# Patient Record
Sex: Female | Born: 1970 | Hispanic: Yes | Marital: Single
Health system: Southern US, Community
[De-identification: ages and names within clinical notes are randomized; demographics above are authoritative.]

## PROBLEM LIST (undated history)

## (undated) ENCOUNTER — Ambulatory Visit: Admission: RE | Source: Ambulatory Visit

## (undated) DIAGNOSIS — U071 COVID-19: Secondary | ICD-10-CM

## (undated) DIAGNOSIS — R112 Nausea with vomiting, unspecified: Secondary | ICD-10-CM

## (undated) DIAGNOSIS — N200 Calculus of kidney: Secondary | ICD-10-CM

## (undated) DIAGNOSIS — Z9109 Other allergy status, other than to drugs and biological substances: Secondary | ICD-10-CM

## (undated) DIAGNOSIS — F32A Depression, unspecified: Secondary | ICD-10-CM

## (undated) DIAGNOSIS — K219 Gastro-esophageal reflux disease without esophagitis: Secondary | ICD-10-CM

## (undated) DIAGNOSIS — D490 Neoplasm of unspecified behavior of digestive system: Secondary | ICD-10-CM

## (undated) DIAGNOSIS — I38 Endocarditis, valve unspecified: Secondary | ICD-10-CM

## (undated) DIAGNOSIS — R6881 Early satiety: Secondary | ICD-10-CM

## (undated) DIAGNOSIS — G4733 Obstructive sleep apnea (adult) (pediatric): Secondary | ICD-10-CM

## (undated) DIAGNOSIS — K5909 Other constipation: Secondary | ICD-10-CM

## (undated) DIAGNOSIS — J189 Pneumonia, unspecified organism: Secondary | ICD-10-CM

## (undated) DIAGNOSIS — Z90710 Acquired absence of both cervix and uterus: Secondary | ICD-10-CM

## (undated) HISTORY — DX: Nausea with vomiting, unspecified: R11.2

## (undated) HISTORY — DX: COVID-19: U07.1

## (undated) HISTORY — PX: TUBAL LIGATION: SHX77

## (undated) HISTORY — PX: HYSTERECTOMY: SHX81

## (undated) HISTORY — DX: Other constipation: K59.09

## (undated) HISTORY — DX: Early satiety: R68.81

## (undated) HISTORY — DX: Pneumonia, unspecified organism: J18.9

## (undated) HISTORY — DX: Calculus of kidney: N20.0

## (undated) HISTORY — DX: Gastro-esophageal reflux disease without esophagitis: K21.9

## (undated) HISTORY — PX: HERNIA REPAIR: SHX51

## (undated) HISTORY — DX: Obstructive sleep apnea (adult) (pediatric): G47.33

## (undated) HISTORY — PX: GALLBLADDER SURGERY: SHX652

## (undated) HISTORY — DX: Neoplasm of unspecified behavior of digestive system: D49.0

## (undated) HISTORY — DX: Depression, unspecified: F32.A

## (undated) HISTORY — DX: Other allergy status, other than to drugs and biological substances: Z91.09

## (undated) HISTORY — PX: CYST REMOVAL: SHX22

## (undated) HISTORY — PX: VAGINAL HYSTERECTOMY: SUR661

## (undated) NOTE — Nursing Note (Signed)
Formatting of this note might be different from the original.  Patient alert and oriented X4. Reported headache and jaw pain consistent with normal symptoms she has at baseline. Tylenol given for headache in am. Imitrex given in afternoon per patient request. Patient more alert and less sleepy this afternoon than in morning or previous shift. Interactive with good appetite noted. Encouraged patient to stay hydrated due to higher elevation and dryness than her home in [place]. Patient still requiring 3.5L of oxygen.   Electronically signed by Tula Nakayama, RN at 09/17/2022  6:16 PM MDT

## (undated) NOTE — Care Plan (Signed)
Formatting of this note might be different from the original.    Problem: Pain - Adult  Goal: Verbalizes/displays adequate comfort level or baseline comfort level  Outcome: Progressing    Problem: Infection - Adult  Goal: Absence of infection at discharge  Outcome: Progressing    Problem: Safety - Adult  Goal: Free from fall injury  Outcome: Progressing    Problem: Respiratory - Adult  Goal: Achieves optimal ventilation and oxygenation  Outcome: Progressing    Problem: Gastrointestinal - Adult  Goal: Maintains or returns to baseline bowel function  Outcome: Progressing   The patient is Moderately Stable - Low risk of patient condition declining or worsening      Electronically signed by Tommi Emery, RN at 09/20/2022 12:20 PM MDT

## (undated) NOTE — Nursing Note (Signed)
Formatting of this note might be different from the original.  Patient A&Ox4. Patient reports 10/10 migraine and nausea. Patient is VSS on 3L O2. Patient given PRN tylenol, sumatriptan, and oxy without relief. Patient given PRN Zofran and Ativan for Nausea with relief. Patient was able to sleep through pain. Patient resting in bed. Call light in reach.  Electronically signed by Ferd Glassing, RN at 09/17/2022  6:23 AM MDT

## (undated) NOTE — Nursing Note (Signed)
Formatting of this note might be different from the original.  Pt is Aox4  Pt anxious, reporting frequent 8-10\10 pain and nausea. PRN medication given per [facility] with little relief.     Pt rested peacefully with BiPAP on intermittently.     VSS on 3L NC while awake. BiPAP used while sleeping.     Pt resting comfortably, no further needs at this time, call light within reach.   Electronically signed by Saintclair Halsted, RN at 09/19/2022  7:05 AM MDT

## (undated) NOTE — Nursing Note (Signed)
Formatting of this note might be different from the original.  Pt Aox4. VSS on 1L NC. All medications given per [facility]. Assessment as charted. PRN Tylenol and PRN Toradol given for 9/10 headache. Zofran given x1, relief provided. Robitussin given x1 per MAR. Pt denies SOB. Pt still having a productive cough.     No further needs at this time. Pt lying in bed comfortably with call light in reach. Fall precautions taken.     IV taken out. Last set of vitals taken. Pt ready for discharge.   Electronically signed by Tommi Emery, RN at 09/20/2022  3:21 PM MDT

## (undated) NOTE — Progress Notes (Signed)
Formatting of this note is different from the original.  Case Management Notes:    Pt will [place] to friends home for one week before travelling back to [place]. O2 arranged through [facility]. No other needs verbalized at this time.    09/19/2022: Valerie May' home qualifying study shows the need for home oxygen at 3 L NC continuous; FOC discussed and patient chose [facility] as her oxygen provider - oxygen orders have been pende but MD wants to reassess for any improvement in respiratory status in the am to see if she will tolerate weaning - oxygen orders HAVE NOT been sent to [facility]; patient will also be discharging to the home of her friend Valerie May at [address] [place] [address] -for oxygen set up purposes; CM continuing to follow for any additional needs    Case Management Initial Assessment for Valerie May    08/20: CM met with Valerie May this morning. The patient reports she has stable living arrangments in [place]. She currently works with her friend Valerie May([phone]) and she will provide a ride home upon discharge to [address], [place]. Patient denies use of O2 at home, DME for ambulation and is independent with all ADL's. Cm will follow for any identified [place] needs.     Pre-admission Assessment and Social Determinants of Health (SDOH)    Patient's age: 33 y.o.     Assessment of acts of daily living and capacity for self-care: independent         Prior level of function and use of durable medical equipment: None, per patient    Living Situation at time of admission: Lives Alone    Do you feel your living situation is safe? yes    Are you worried about losing your housing? Not at this time, per pt.     Are you having difficulty paying for the basics like food, housing, medical care, and heating? NO    Has lack of reliable transportation kept you from medical appointments, meetings, work or from getting things needed for daily living? NO    Do you have family or a  support system and are they availability to help you at discharge? Yes, friend Valerie May [phone]    If for any reason you need help with day-to-day activities such as bathing, preparing meals, shopping, managing finances, etc., can you get the help you need? yes    Pre-Admission [facility]: None    Current Status of Admission  Diagnosis:     Pneumonia of both lungs due to infectious organism, unspecified part of lung   [J96.01] Acute respiratory failure with hypoxia (HCC)     Mental Status: oriented to person, place, time, and general circumstances    Known changes in mobility with hospital admission? Yes, decreased mobility r/t to oxygen needs and diagnosis.    Patient Demographics        Name Patient ID SSN Gender Identity Birth Date    Valerie May, Valerie May [id number] Female Jun 23, 1970 (51 yrs)             Address Phone Email        [address]  [place] [address] [phone] (H) [email or web address]               Race Occupation Emp Status        Other -- --               Reg Status PCP  Date Last Verified Next Review Date      Verified -- 09/15/22 10/15/22              Marital Status Religion Language        Single -- English               Emergency Contact 1    Valerie May [phone])  [phone] (M)                 Electronically signed by Valerie Melnick, LCSW at 09/20/2022  1:01 PM MDT

## (undated) NOTE — Care Plan (Signed)
Formatting of this note might be different from the original.  The patient is Moderately Stable - Low risk of patient condition declining or worsening    Problem: Pain - Adult  Goal: Verbalizes/displays adequate comfort level or baseline comfort level  Outcome: Maintaining  Flowsheets (Taken 09/17/2022 1117 by Tula Nakayama, RN)  Verbalizes/displays adequate comfort level or baseline comfort level:   Encourage patient to monitor pain and request assistance   Assess pain using appropriate pain scale   Administer analgesics based on type and severity of pain and evaluate response   Implement non-pharmacological measures as appropriate and evaluate response   Consider cultural and social influences on pain and pain management    Problem: Infection - Adult  Goal: Absence of infection at discharge  Outcome: Maintaining  Flowsheets (Taken 09/16/2022 0358 by Robinette Haines, RN)  Absence of infection at discharge:   Assess and monitor for signs and symptoms of infection   Monitor lab/diagnostic results   Monitor all insertion sites i.e., indwelling lines, tubes and drains   Administer medications as ordered    Problem: Safety - Adult  Goal: Free from fall injury  Outcome: Maintaining  Flowsheets (Taken 09/17/2022 1117 by Tula Nakayama, RN)  Free from fall injury: Instruct family/caregiver on patient safety    Problem: Respiratory - Adult  Goal: Achieves optimal ventilation and oxygenation  Outcome: Maintaining  Flowsheets (Taken 09/17/2022 0930 by Tula Nakayama, RN)  Achieves optimal ventilation and oxygenation: Position to facilitate oxygenation and minimize respiratory effort    Problem: Gastrointestinal - Adult  Goal: Maintains or returns to baseline bowel function  Outcome: Maintaining  Flowsheets (Taken 09/16/2022 0358 by Robinette Haines, RN)  Maintains or returns to baseline bowel function: Assess bowel function    Electronically signed by Cyndia Skeeters, RN at 09/19/2022 12:50 PM MDT

## (undated) NOTE — Progress Notes (Signed)
Formatting of this note is different from the original.     09/19/22 1325   Resting Oximetry   Home O2 Patient Position Sitting   Resting SpO2 on RA 86   Resting SpO2 Goal 90   Liter Flow at Rest to Maintain Goal 3   Activity Oximetry   Patient Activity During Home O2 Eval Walking   Activity SpO2 on RA 85   Activity SpO2 Goal 90   Liter Flow with Activity to Maintain Goal 3  (4 lpm spo2 94%)   Activity SpO2 at 1 min 90   Activity SpO2 at 2 min 90   Activity SpO2 at 3 min 90   Activity SpO2 at 4 min 88   Activity SpO2 at 5 min 90   Activity SpO2 at 6 min 90   Home O2 Activity Distance   (around the unit)   Requires greater than or equal to 4L/min? No       Electronically signed by Janeal Holmes, RRT at 09/19/2022  1:30 PM MDT

## (undated) NOTE — Care Plan (Signed)
Formatting of this note might be different from the original.    Problem: Respiratory - Adult  Goal: Achieves optimal ventilation and oxygenation  Recent Flowsheet Documentation  Taken 09/17/2022 0930 by Tula Nakayama, RN  Achieves optimal ventilation and oxygenation: Position to facilitate oxygenation and minimize respiratory effort    Problem: Pain - Adult  Goal: Verbalizes/displays adequate comfort level or baseline comfort level  Outcome: Not Progressing  Flowsheets (Taken 09/17/2022 1117)  Verbalizes/displays adequate comfort level or baseline comfort level:   Encourage patient to monitor pain and request assistance   Assess pain using appropriate pain scale   Administer analgesics based on type and severity of pain and evaluate response   Implement non-pharmacological measures as appropriate and evaluate response   Consider cultural and social influences on pain and pain management    Problem: Safety - Adult  Goal: Free from fall injury  Outcome: Not Progressing  Flowsheets (Taken 09/17/2022 1117)  Free from fall injury: Instruct family/caregiver on patient safety    Electronically signed by Tula Nakayama, RN at 09/17/2022 11:18 AM MDT

## (undated) NOTE — Nursing Note (Signed)
Formatting of this note might be different from the original.  Pt is Aox4, with VSS on 3.5L NC.     Pt reporting consistent 10/10 headache, PRN tylenol, oxy, and ice packs given with no relief.   Pt anxious, and at times tearful.    Pt experienced 2x episodes of nausea, emesis X1. PRN zofran given with relief. Pt also reporting pain in the abdomen and feelings of fullness/constipation, however, no BM this shift, PRN meds given as ordered.     Pt resting in bed, no further needs at this time, call light within reach, bed alarm on for pt safety.  Electronically signed by Saintclair Halsted, RN at 09/18/2022  6:42 AM MDT
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
## (undated) NOTE — Progress Notes (Signed)
Formatting of this note is different from the original.     09/18/22 1030   Non Invasive Ventilation   NIV Type   (Autopap)   Mask Type Face mask   Mask Size Small   Skin Integrity Checked Yes   Expiratory Positive Airway Pressure (EPAP/CPAP) (cmH20) 15 cmH20  (max @ 20 when Pt sleeps SPO2 90-92)   Liter Flow Bleed In 3.5 L/min   Humidifier Filled       Electronically signed by Janeal Holmes, RRT at 09/18/2022 12:12 PM MDT

## (undated) NOTE — Care Plan (Signed)
Formatting of this note might be different from the original.  The patient is Moderately Stable - Low risk of patient condition declining or worsening  Problem: Pain - Adult  Goal: Verbalizes/displays adequate comfort level or baseline comfort level  Outcome: Maintaining        Electronically signed by Kandis Cocking, RN at 09/18/2022 12:19 PM MDT

## (undated) NOTE — Progress Notes (Signed)
Formatting of this note is different from the original.  DAILY PROGRESS NOTE    MYDA DETWILER is a 32 y.o. old female admitted on 09/15/2022 for hypoxia.    Date of Service: 09/19/2022    24 Hour Events:   No major events    SUBJECTIVE    Yelling little better today, hopeful to be able to discharge within the next day.  Not somnolent.  Headache is better than yesterday.  Discussed findings on CT scan and imaging.  Hopeful to reduce amount of oxygen required for discharged home tomorrow.    MEDICATIONS  Current Facility-Administered Medications   Medication Dose Route Frequency Provider Last Rate Last Admin    acetaminophen (Tylenol) tablet 650 mg  650 mg Oral q4h PRN Dell Ponto, MD   650 mg at 09/19/22 1610    Or    acetaminophen (Tylenol) suspension 650 mg  650 mg Oral q4h PRN Dell Ponto, MD        Or    acetaminophen (Tylenol) suppository 650 mg  650 mg Rectal q4h PRN Dell Ponto, MD        albuterol (2.5 MG/3ML) 0.083% nebulizer solution 2.5 mg  2.5 mg Nebulization q2h PRN Dell Ponto, MD        azithromycin (Zithromax) tablet 500 mg  500 mg Oral Nightly Jonelle Sidle, MD   500 mg at 09/18/22 2109    bisacodyl (Dulcolax) EC tablet 10 mg  10 mg Oral Daily PRN Dell Ponto, MD   10 mg at 09/17/22 2132    budesonide-formoterol (Symbicort) 160-4.5 MCG/ACT inhaler 2 puff  2 puff Inhalation BID PRN Dell Ponto, MD        cefTRIAXone (Rocephin) IVPB 1 g  1 g Intravenous Nightly Dell Ponto, MD   Stopped at 09/18/22 2139    enoxaparin (Lovenox) syringe 40 mg  40 mg Subcutaneous q24h [facility] Dell Ponto, MD   40 mg at 09/19/22 0816    guaiFENesin-codeine (Robitussin-AC) 100-10 MG/5ML syrup 10 mL  10 mL Oral q4h PRN Dell Ponto, MD   10 mL at 09/19/22 0349    ipratropium-albuterol (Duo-Neb) 0.5-2.5 mg/3 mL nebulizer solution 3 mL  3 mL Nebulization q6h Jonelle Sidle, MD   3 mL at 09/19/22 1334    ketorolac (Toradol) injection 15 mg  15 mg Intravenous q8h PRN Harriette Bouillon, MD   15 mg at 09/19/22  0047    loratadine (Claritin) tablet 10 mg  10 mg Oral Daily Dell Ponto, MD   10 mg at 09/19/22 0815    melatonin tablet 3 mg  3 mg Oral Nightly PRN Dell Ponto, MD   3 mg at 09/18/22 0113    montelukast (Singulair) tablet 10 mg  10 mg Oral Nightly Dell Ponto, MD   10 mg at 09/18/22 2109    naloxone (Narcan) 0.2 mg in sodium chloride 0.9 % RRT Compounded syringe  0.2 mg Intravenous PRN Dell Ponto, MD        ondansetron (Zofran) injection 4 mg  4 mg Intravenous q6h PRN Jonelle Sidle, MD   4 mg at 09/19/22 0518    Or    ondansetron ODT (Zofran-ODT) disintegrating tablet 4 mg  4 mg Oral q6h PRN Jonelle Sidle, MD   4 mg at 09/18/22 1854    polyethylene glycol (Miralax) packet 17 g  17 g Oral Daily Dell Ponto, MD   17 g at 09/19/22 0816    venlafaxine XR (Effexor-XR) 24 hr capsule 75 mg  75 mg Oral Daily with breakfast Dell Ponto, MD   75 mg at 09/19/22 0815     OBJECTIVE  Current Vital Signs  Temp: 36.5 C (97.7 F) BP: 126/79 Heart Rate: 105     Resp: 24   (out of 10) Weight: 84.4 kg (186 lb 1.1 oz) SpO2: 91 %     Physical Exam  Gen: NAD  Eyes: sclera anicteric  CV: RRR, no murmur  Resp: lungs are clear with normal respiratory effort  Abdomen: soft, ND, NT, bowel sounds are active  MSK: no LE edema or erythema  Skin: no significant rash  Neuro: speech normal and fluent, no obvious focal deficits  Psych: appropriate affect    Diagnostics and Labs  Relevant diagnostic, laboratory and radiological studies have been reviewed in the Electronic Medical Record from 09/19/2022 and significant for the following:  Lab Results   Component Value Date    WBC 9.7 09/19/2022    HGB 13.8 09/19/2022    PLT 346 09/19/2022    NA 139 09/19/2022    K 3.4 09/19/2022    CL 97 (L) 09/19/2022    CO2 35 (H) 09/19/2022    BUN 12 09/19/2022    CREATININE 0.45 (L) 09/19/2022    AST 25 09/16/2022    ALT 27 09/16/2022    ALKPHOS 101 09/16/2022    ALBUMIN 3.7 09/16/2022     Cta chest  IMPRESSION:  1.  No PE through the segmental  level.  2.  Focal airspace opacities in both lung bases indicate pneumonia.    Electronically signed by: Madalyn Rob, MD    ASSESSMENT & PLAN  Ms. ETHELENE CLOSSER is a 37 y.o. 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    #Acute hypercapnic hypoxic respiratory failure  #Suspected pneumonia  -Symptoms concerning for viral infection however viral panel negative.   -Treating for complete course of community-acquired pneumonia with ceftriaxone and azithromycin.  -Bicarb up trended and has VBG consistent with compensated hypercapnia. She has likely OSA and has not been compliant with her CPAP in hospital that is contributing to this, no wheezing on exam. Has been receiving occasional opiate as well as which has been stopped.   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  - encourage BiPAP use at night, as I suspect that in some degree contributing to her ongoing hypoxia.   -Robitussin AC for cough.  APAP for fever.  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  -Not wheezing on exam.    -Continue home Singulair, Symbicort, as needed albuterol.    #Right upper quadrant discomfort  -Resolved  -Patient does not have gallbladder.    -LFTs unremarkable.   -likely referred pain from her lower lobe pneumonia.  Treat pneumonia as above.  Low-dose oxycodone added to APAP due to patient complaint of uncontrolled pain.    # right ear pain:  # headache:  -states this has been present since covid. Has seen two ent providers without clear diagnosis  - also reports history of migraine  headaches and takes sumatriptan and excedrine for  this.   -CT head without contrast did not show any acute findings.  -Elevated CO2 is likely contributing somewhat to her headaches.  -After further review with the patient she is taking 8 or more doses of Excedrin daily for headache.  Anticipate that she has some degree of rebound headache from this and encouraged her to taper off.  -prn tylenol, toradol PRN -limit as able and reduce over the next week.  - stopped oxycodone    #Chronic constipation  Schedule MiraLAX, as needed Dulcolax ordered.    #Suspect OSA  -continued desatting significantly while sleeping.    -OSA protocol ordered.    -Recommend outpatient sleep study.    DVT Prophylaxis:  Lovenox   Code Status: Full Code   Surrogate decision maker designated as her son Barbara Cower, number in chart.    Harriette Bouillon, MD  Hospitalist    Electronically signed by Harriette Bouillon, MD at 09/19/2022  3:44 PM MDT

## (undated) NOTE — Nursing Note (Signed)
Formatting of this note might be different from the original.  Pt A/Ox4, VSS on 3L, patient self removed oxygen a couple of times during the shift and sats noted to be in the 50s, Reinforced to patient need to keep oxygen on at all times. pt has been wearing CPAP at night.  Pt states 10/10 pain in right ear and head, pain slightly relieved with medication.  Pt able to rest through pain as well.  Stated nausea relieved with medication  Bilateral inspiratory wheezes with fine crackles heard in base of lungs.  Pt educated on cough and deep breathe technique  Pt stated lower extremities were cold.  Pedal pulses 2+, bilateral lower extremities warm and dry. CMS intact. Warm blanket given for comfort  Regular diet, pt eating jello and crackers mostly  Continent x2  Pt is able to turn self  Pt denies further needs, pt resting in bed with call light in reach  Electronically signed by Lacinda Axon, RN at 09/16/2022  5:49 PM MDT
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
## (undated) NOTE — ED Provider Notes (Signed)
Formatting of this note is different from the original.  EMERGENCY DEPARTMENT ADULT ENCOUNTER     09/15/2022    Patient Name: Valerie May      DOB: 15-Jul-1970      MEDICAL RECORD [id number]    CHIEF COMPLAINT:  Chief Complaint   Patient presents with    Cough    Fatigue    Breathing Problem     Cough fatigue body aches.  Abdominal pain back pain since yesterday     MEDICAL DECISION MAKING & ED COURSE:     FINAL IMPRESSION(S):  Final diagnoses:   [J18.9] Pneumonia of both lungs due to infectious organism, unspecified part of lung   [J96.01] Acute respiratory failure with hypoxia [facility])       MEDICAL DECISION MAKING:  Valerie May is a 51 y.o. woman with history as below presenting with 2-3 days of worsening cough, shortness of breath, subjective fevers with multiple sick contacts here with acute on chronic respiratory failure and new oxygen requirement with possible underlying community-acquired pneumonia.    Arrival patient is saturating 86-87% on room air was promptly placed on 2 L nasal cannula which has been maintaining saturations in the low 90%.  Patient has a history of intermittent oxygen requirement at home but does not routinely use it and that she is feeling symptomatic.  Patient with medical workup here that was initially unrevealing, however in light of persistent hypoxia despite nebulizer treatment particular with desaturations to as low as 82 to 84% with exertion I thought it best to expand workup to include advanced imaging of the chest as well as abdomen and pelvis.  Focal opacities recognized in the by basilar lung spaces thought to possibly represent an acute pneumonia.     At this time patient is not having overwhelming pneumonia.  I suspect that there may be a burgeoning bacterial pneumonia versus possible viral pneumonia which coupled with the patient's likely underlying lung condition from prior severe COVID-19 infection as well as the altitude has resulted in fairly profound  hypoxemia.    Patient with ongoing abdominal pain symptoms with subjective constipation, CT of the abdomen/pelvis revealed no acute findings. Patient was requesting stronger pain medication aside from the ketorolac as well as the acetaminophen.  Without any discernible cause for pain in the abdomen pelvis I am hesitant to administer anything stronger than these at this time.    A recommended patient for admission to the hospital given ongoing oxygen requirements and poorly controlled symptoms.  Patient was initially amenable however I was informed by nursing staff that she was requesting to leave AGAINST MEDICAL ADVICE.  As such I did not administer IV antibiotics but gave her a dose of oral Levaquin with a plan to prescribe this as an outpatient.  I was soon thereafter called back to patient's bed by stating that she had changed her mind and was now requesting admission to the hospital.    During my evaluation, I considered hospitalization and ultimately decided that the patient requires hospital admission and hospital level of care.    Will kindly admit patient to Dr. Renard Hamper on a acute care bed.     ED COURSE:  ED Course as of 09/15/22 2346   Mon Sep 15, 2022   1647 Chest X-ray images (independently reviewed by me): No lobar consolidation, large pneumothorax, or mediastinal widening, trachea midline. I agree with the radiology report.     1818 Reevaluated bedside still with desaturations to the high 80  and low 90%'s     ED MEDICATIONS/PRESCRIPTIONS:    ED Medication Administration from 09/15/2022 1408 to 09/15/2022 2346         Date/Time Order Dose Route Action Action by     09/15/2022 1642 MDT sodium chloride 0.9 % bolus 1,000 mL 1,000 mL Intravenous New Bag Vanderwege, K     09/15/2022 1644 MDT famotidine (PF) (Pepcid) injection 20 mg 20 mg Intravenous Given Vanderwege, K     09/15/2022 1644 MDT ketorolac (Toradol) injection 15 mg 15 mg Intravenous Given Vanderwege, K     09/15/2022 1644 MDT ondansetron (Zofran)  injection 4 mg 4 mg Intravenous Given Vanderwege, K     09/15/2022 1654 MDT sodium chloride 0.9 % bolus 1,000 mL 1,000 mL Intravenous New Bag Vanderwege, K     09/15/2022 1739 MDT ipratropium-albuterol (Duo-Neb) 0.5-2.5 mg/3 mL nebulizer solution 3 mL 3 mL Nebulization Given Byrnes, R     09/15/2022 1742 MDT sodium chloride 0.9 % bolus 1,000 mL 0 mL Intravenous Stopped Vanderwege, K     09/15/2022 1754 MDT sodium chloride 0.9 % bolus 1,000 mL 0 mL Intravenous Stopped Vanderwege, K     09/15/2022 2033 MDT iopamidol (Isovue-370) 76 % injection 120 mL 120 mL Intravenous Given Shelton Silvas     09/15/2022 2033 MDT sodium chloride 0.9 % bolus 100 mL 100 mL Intravenous New Bag Daphine Deutscher, Shela Commons     09/15/2022 2033 MDT sodium chloride 0.9 % flush 10 mL 10 mL Intravenous Given Shelton Silvas     09/15/2022 2133 MDT sodium chloride 0.9 % bolus 100 mL 0 mL Intravenous Lottie Dawson, J     09/15/2022 2200 MDT azithromycin (Zithromax) 500 mg in sodium chloride 0.9 % 250 mL IVPB -- Intravenous Canceled Entry Lilly, N     09/15/2022 2223 MDT acetaminophen (Tylenol) tablet 1,000 mg 1,000 mg Oral Given Lilly, N     09/15/2022 2223 MDT cefTRIAXone (Rocephin) IVPB 1 g 1 g Intravenous [employer], N     09/15/2022 2223 MDT levoFLOXacin (Levaquin) tablet 500 mg 500 mg Oral Given Lilly, N     09/15/2022 2253 MDT cefTRIAXone (Rocephin) IVPB 1 g 0 g Intravenous Stopped Lilly, N     09/15/2022 2334 MDT azithromycin (Zithromax) 500 mg in sodium chloride 0.9 % 250 mL IVPB 500 mg Intravenous New Bag Lilly, N         New Prescriptions    ALBUTEROL 108 (90 BASE) MCG/ACT INHALER    Inhale 2 puffs every 4 (four) hours if needed for wheezing.    BENZONATATE (TESSALON PERLES) 100 MG CAPSULE    Take 1 capsule (100 mg) by mouth 3 (three) times a day as needed for cough for up to 7 days. Do not crush or chew.    LEVOFLOXACIN (LEVAQUIN) 500 MG TABLET    Take 1 tablet (500 mg) by mouth Daily for 7 days. Do not start before September 16, 2022.     Modified Medications     No medications on file       DISPOSITION:  Admit    CONDITION AT DISPOSITION:  Fair    FOLLOW UP:  No follow-up provider specified.    Subjective   HISTORY:     Valerie May is a 58 y.o. woman with a history of prior severe COVID requiring intubation ("I had only 5% life left," apparently intubated with a multi month hospital course) with resultant asthma, obesity, s/p cholecystectomy who is presenting today with 2-3 days of worsening cough,  shortness of breath with associated fevers and upper abdominal pain worse with deep inspiration and coughing.    Patient states that she lives in [place] and drove out here last week to visit her friend.  She spent multiple hours in the car without mobilizing.  She states while in the car she was exposed to multiple young people who went on to develop a cough as well as fever.  No one had checked for COVID with home testing.  She last took Tylenol and ibuprofen approximately 7 hours prior to arrival.  She is visiting [place] and plans to be here for the next week.    Patient states she intermittently uses oxygen at home when "I am feeling short of breath."  She does not have a specific oxygen saturation target and does not use oxygen routinely or at night.  She has presented to the hospital multiple times in [place] with shortness of breath that is improved with nebulizers.    No history of DVT/VTE.    REVIEW OF SYSTEMS:  A 10-point ROS was obtained and is negative except as noted in the HPI.     PAST MEDICAL HISTORY:  No past medical history on file.    PAST SURGICAL HISTORY:  No past surgical history on file.    CURRENT MEDICATIONS:    Current Facility-Administered Medications:     azithromycin (Zithromax) 500 mg in sodium chloride 0.9 % 250 mL IVPB, 500 mg, Intravenous, Once, Bosie Helper, MD, Last Rate: 250 mL/hr at 09/15/22 2334, 500 mg at 09/15/22 2334    Current Outpatient Medications:     acetaminophen (Tylenol 8 Hour) 650 MG ER tablet, Take 1 tablet (650  mg) by mouth every 8 (eight) hours if needed for mild pain., Disp: , Rfl:     albuterol (Ventolin HFA) 108 (90 Base) MCG/ACT inhaler, Inhale 1-2 puffs every 4 (four) hours if needed for shortness of breath or wheezing., Disp: , Rfl:     budesonide-formoterol (Symbicort) 160-4.5 MCG/ACT inhaler, Inhale 2 puffs 2 (two) times a day as needed (SOB)., Disp: , Rfl:     butalbital-acetaminophen-caffeine (Fioricet) 50-300-40 MG capsule, Take 1 capsule by mouth every 6 (six) hours if needed for headaches., Disp: , Rfl:     cetirizine (ZyrTEC) 10 MG tablet, Take 1 tablet (10 mg) by mouth Daily., Disp: , Rfl:     fluticasone (Flonase) 50 MCG/ACT nasal spray, Administer 1 spray into each nostril if needed at bedtime for rhinitis., Disp: , Rfl:     ibuprofen 200 MG tablet, Take 1 tablet (200 mg) by mouth every 8 (eight) hours if needed for mild pain., Disp: , Rfl:     lidocaine (Xylocaine) 2 % solution, Take 15 mL by mouth if needed for mild pain., Disp: , Rfl:     montelukast (Singulair) 10 MG tablet, Take 1 tablet (10 mg) by mouth at bedtime., Disp: , Rfl:     venlafaxine XR (Effexor-XR) 75 MG 24 hr capsule, Take 1 tablet by mouth Daily., Disp: , Rfl:     albuterol 108 (90 Base) MCG/ACT inhaler, Inhale 2 puffs every 4 (four) hours if needed for wheezing., Disp: 18 g, Rfl: 0    benzonatate (Tessalon Perles) 100 MG capsule, Take 1 capsule (100 mg) by mouth 3 (three) times a day as needed for cough for up to 7 days. Do not crush or chew., Disp: 20 capsule, Rfl: 0    Calcium Carbonate Antacid (ALKA-SELTZER ANTACID PO), Take 1 tablet by mouth Daily  as needed (indigestion)., Disp: , Rfl:     [START ON 09/16/2022] levoFLOXacin (Levaquin) 500 MG tablet, Take 1 tablet (500 mg) by mouth Daily for 7 days. Do not start before September 16, 2022., Disp: 6 tablet, Rfl: 0     ALLERGIES:  Allergies   Allergen Reactions    Benadryl [Diphenhydramine]     Compazine [Prochlorperazine]      SOCIAL HISTORY:  Social History     Socioeconomic History     Marital status: Single     Spouse name: Not on file    Number of children: Not on file    Years of education: Not on file    Highest education level: Not on file   Occupational History    Not on file   Tobacco Use    Smoking status: Not on file    Smokeless tobacco: Not on file   Substance and Sexual Activity    Alcohol use: Not on file    Drug use: Not on file    Sexual activity: Not on file   Other Topics Concern    Not on file   Social History Narrative    Not on file     Social Determinants of Health     Financial Resource Strain: Not on file   Food Insecurity: Not on file   Transportation Needs: Not on file   Physical Activity: Not on file   Stress: Not on file   Social Connections: Not on file   Intimate Partner Violence: Not on file   Housing Stability: Not on file       Objective   PHYSICAL EXAM:     ED Triage Vitals [09/15/22 1426]   Temp Temp src Heart Rate Heart Rate (Pulse) Source Resp BP Patient Position BP Location SpO2 FiO2 (%)   36.6 C (97.9 F) -- 108 -- 20 123/76 -- -- (!) 87 % --       *Vital signs were reviewed in the chart. Please see chart for full vital signs. The macro above only displays vital signs that may have been obtained prior to this note being started.     Pulse Ox Interpretation:  87% on room air, abnormally low pulse oximetry compared to patient's baseline (as interpreted by me)    Gen: Moderate distress secondary to pain, slightly anxious appearing Hispanic woman sitting upright in hospital gurney  HEENT: Normocephalic, atraumatic, pupils equal round and reactive to light, EOMI, moist mucous membranes, normal phonation  Neck: Normal appearance, trachea midline  Chest: Normal appearance, symmetric rise and fall  Lungs: No respiratory distress, no apparent wheezing there is some bibasilar crackles present  Cardiac: No murmurs, rubs or gallops, regular rate and rhythm   Abdomen: Soft, normal focal TTP, non-distended, no peritoneal signs, no rebound tenderness or guarding  Back:  Normal inspection  Integument: No rashes or clinically significant lesions, normal skin turgor  Musculoskeletal: no long bone deformities, normal active and passive range of motion at major joints   Neuro: Moving all extremities spontaneously, GCS 15, A/Ox4, normal gait   Psych: Normal mood, normal affect, interacting appropriately with good eye contact    DIAGNOSTIC RESULTS:     RADIOLOGY REPORTS:  CT Abdomen Pelvis with Contrast   Final Result   No acute finding in the abdomen or pelvis. See same day report for description of findings in the chest.     Electronically signed by: Madalyn Rob, MD     CTA Chest   Final  Result   1.  No PE through the segmental level.   2.  Focal airspace opacities in both lung bases indicate pneumonia.     Electronically signed by: Madalyn Rob, MD     XRAY Chest Portable   Final Result   Low inspiratory volumes.     Electronically signed by: Madalyn Rob, MD         LABORATORY RESULTS:  Labs Reviewed   CBC WITH AUTO DIFFERENTIAL - Abnormal       Result Value    WBC 8.6      RBC 4.67      Hemoglobin 14.2      Hematocrit 43.1      MCV 92.3      MCH 30.4      MCHC 32.9      RDW CV 12.3      RDW SD 41.7      Platelet Count 368      MPV 9.2      Neutrophils Absolute 4.84      Lymphocytes Absolute 2.24      Monocytes Absolute 0.80      Eosinophils Absolute 0.60      Basophils Absolute 0.08      Immature Granulocytes Absolute 0.06 (*)     Neutrophils Abs, (Segs and Bands) 4,840      Neutrophils % 56.1      Lymphocytes % 26.0      Monocytes % 9.3      Eosinophils % 7.0      Basophils % 0.9      Immature Granulocytes % 0.7      Nucleated RBC <2     PROCALCITONIN - Abnormal    Procalcitonin 0.11 (*)     Narrative:     - PCT >2 ng/mL: a PCT level above 2.0 ng/mL on the first day of admission is associated with a high risk for progression to severe sepsis and/or septic shock.                     - PCT < 0.5 ng/mL: A PCT level below 0.5 ng/mL on the first day of admission is  associated with a low risk for progression to severe sepsis and/or septic shock.                     - Neonates can have an elevated PCT level for 72 hours post birth, peaking at approximately 24 hours post birth.   SARS-COV-2 NAD - Normal    SARS-CoV-2 (COVID-19) Not Detected      Narrative:     Testing performed by real-time polymerase chain reaction (RT-PCR) methodology on the [employer] Liat instrument.    COMPREHENSIVE RESPIRATORY PATHOGEN PANEL, NAT - Normal    Adenovirus Detection by PCR Not Detected      Coronavirus HKU1 (not a test for COVID-19) Not Detected      Coronavirus NL63 (not a test for COVID-19) Not Detected      Coronavirus 229E (not a test for COVID-19) Not Detected      Coronavirus OC43 (not a test for COVID-19) Not Detected      Human Metapneumovirus Not Detected      Human Rhinovirus/Enterovirus Not Detected      Influenza A Not Detected      Influenza B Not Detected      Parainfluenza 1 Not Detected      Parainfluenza 2 Not Detected  Parainfluenza 3 Not Detected      Parainfluenza 4 Not Detected      RSV Not Detected      SARS-CoV-2 (COVID-19) Not Detected      Bordetella pertussis PCR Not Detected      B. parapertussis DNA Not Detected      Chlamydophila pneumoniae Not Detected      Mycoplasma pneumoniae Not Detected      Narrative:     This test was performed by multiplexed nested PCR and melting curve analysis on the [employer] instrument. Clinical testing for this panel is approved by the [place] Food and Drug Administration (FDA).    COMPREHENSIVE METABOLIC PANEL - Normal    Glucose 97      BUN 8      Creatinine 0.61      Sodium 141      Potassium 3.7      Chloride 101      CO2 27      Anion Gap 13      Calcium 9.4      Total Protein 7.2      Albumin 4.1      Alkaline Phosphatase 121      AST 26      ALT (SGPT) 30      Bilirubin Total 0.2      eGFR 109.8     LIPASE - Normal    Lipase 18     D-DIMER, QUANTITATIVE - Normal    D-Dimer, Quant (FEU) 0.37      Narrative:     For  patients >53 years of age the test cut off is the age divided by 100                  A D-dimer level < 0.50 ug/mL FEU is inconsistent with venous thromboembolic disease. In a patient with a low or intermediate pretest probability, venous thromboembolic disease can be ruled out if the D-dimer level is < 0.50 ug/mL FEU. Elevated D-dimer levels are nonspecific and may be seen in recent surgery, acute infection, malignancy and renal disease.   N-TERMINAL PROBNP - Normal    NT-Pro BNP <36     CBC AND DIFFERENTIAL    Narrative:     The following orders were created for panel order CBC and differential.                  Procedure                               Abnormality         Status                                     ---------                               -----------         ------                                     CBC auto differential[[id number]]         Abnormal            Final result  Please view results for these tests on the individual orders.   EXTRA SST TUBE    Extra Tube Hold for add-ons.     LEGIONELLA ANTIGEN, URINE   STREP PNEUMONIAE ANTIGEN, URINE     CARDIAC MONITOR:  I ordered cardiac monitoring for patient. Patient was placed on cardiac monitoring to evaluate for arrhythmia or abnormal heart rate, which revealed Sinus Tachycardia and was without ectopy}.    Interpreted by me for greater than 10 seconds as a abnormal cardiac monitor.     MDM CODING INFORMATION:     AMOUNT AND COMPLEXITY OF DATA RECEIVED:  -Discussed Management: Management of this case was discussed real-time with the following consultants/providers: Dr. Renard Hamper, hospitalist medicine   -The following studies were reviewed and interpreted by me contemporaneously in the emergency department: X-rays  (Interpretation: see charting)  -The following studies were reviewed and interpreted by me contemporaneously in the emergency department: CT Scans  (Interpretation: see charting)  -The following studies were  reviewed and interpreted by me contemporaneously in the emergency department: Lab testing/serology  (Interpretation: see charting)    MDM RISK DATA:  -The patient was managed with prescription medications while in the ED  -A decision regarding hospitalization was made during this visit as noted in the chart.        Electronically signed by,    Marveen Reeks. McMickle, MD  Emergency Medicine Physician, [facility]  [facility]    This chart may have been constructed in part using Print production planner. The technology is imperfect and may result in some dictation errors.    Bosie Helper, MD  09/15/22 [phone]    Electronically signed by Bosie Helper, MD at 09/15/2022 11:46 PM MDT

## (undated) NOTE — Nursing Note (Signed)
Formatting of this note might be different from the original.  This morning pt very drowsy and pt RR 22-24, pt afebrile SpO2 >90% on 2L. Respiratory therapist and provider notified. Pt was refitted with new CPAP mask and new orders placed by provider(lab and imaging). Pt c/o of 10/10 headache throughout shift, Toradol given x1, pain decreased to 8-9/10. By late afternoon pt much more alert and talkative. Unknown disposition. Pt resting comfortably at shift change and call light within reach.   Electronically signed by Kandis Cocking, RN at 09/18/2022  7:46 PM MDT

## (undated) NOTE — Care Plan (Signed)
Formatting of this note might be different from the original.    Problem: Pain - Adult  Goal: Verbalizes/displays adequate comfort level or baseline comfort level  Outcome: Progressing  Flowsheets (Taken 09/16/2022 0358)  Verbalizes/displays adequate comfort level or baseline comfort level:   Encourage patient to monitor pain and request assistance   Assess pain using appropriate pain scale   Administer analgesics based on type and severity of pain and evaluate response   Implement non-pharmacological measures as appropriate and evaluate response    Problem: Infection - Adult  Goal: Absence of infection at discharge  Outcome: Progressing  Flowsheets (Taken 09/16/2022 0358)  Absence of infection at discharge:   Assess and monitor for signs and symptoms of infection   Monitor lab/diagnostic results   Monitor all insertion sites i.e., indwelling lines, tubes and drains   Administer medications as ordered    Problem: Safety - Adult  Goal: Free from fall injury  Outcome: Progressing  Flowsheets (Taken 09/16/2022 0358)  Free from fall injury: Instruct family/caregiver on patient safety    Problem: Respiratory - Adult  Goal: Achieves optimal ventilation and oxygenation  Outcome: Progressing  Flowsheets (Taken 09/16/2022 0358)  Achieves optimal ventilation and oxygenation:   Assess for changes in respiratory status   Assess for changes in mentation and behavior   Position to facilitate oxygenation and minimize respiratory effort   Oxygen supplementation based on oxygen saturation or arterial blood gases    Problem: Gastrointestinal - Adult  Goal: Maintains or returns to baseline bowel function  Outcome: Progressing  Flowsheets (Taken 09/16/2022 0358)  Maintains or returns to baseline bowel function: Assess bowel function   The patient is Moderately Stable - Low risk of patient condition declining or worsening      Electronically signed by Robinette Haines, RN at 09/16/2022  3:59 AM MDT

## (undated) NOTE — Progress Notes (Signed)
Formatting of this note is different from the original.  Case Management Notes:    09/19/2022: Ms Bun' home qualifying study shows the need for home oxygen at 3 L NC continuous; FOC discussed and patient chose [facility] as her oxygen provider - oxygen orders have been pende but MD wants to reassess for any improvement in respiratory status in the am to see if she will tolerate weaning - oxygen orders HAVE NOT been sent to [facility]; patient will also be discharging to the home of her friend Philbert Riser at [address] [place] [address] -for oxygen set up purposes; CM continuing to follow for any additional needs    Case Management Initial Assessment for [name]    08/20: CM met with [name] this morning. The patient reports she has stable living arrangments in [place]. She currently works with her friend Madeline([phone]) and she will provide a ride home upon discharge to [address], [place]. Patient denies use of O2 at home, DME for ambulation and is independent with all ADL's. Cm will follow for any identified [place] needs.     Pre-admission Assessment and Social Determinants of Health (SDOH)    Patient's age: 33 y.o.     Assessment of acts of daily living and capacity for self-care: independent         Prior level of function and use of durable medical equipment: None, per patient    Living Situation at time of admission: Lives Alone    Do you feel your living situation is safe? yes    Are you worried about losing your housing? Not at this time, per pt.     Are you having difficulty paying for the basics like food, housing, medical care, and heating? NO    Has lack of reliable transportation kept you from medical appointments, meetings, work or from getting things needed for daily living? NO    Do you have family or a support system and are they availability to help you at discharge? Yes, friend Madiline [phone]    If for any reason you need help with  day-to-day activities such as bathing, preparing meals, shopping, managing finances, etc., can you get the help you need? yes    Pre-Admission [facility]: None    Current Status of Admission  Diagnosis:     Pneumonia of both lungs due to infectious organism, unspecified part of lung   [J96.01] Acute respiratory failure with hypoxia (HCC)     Mental Status: oriented to person, place, time, and general circumstances    Known changes in mobility with hospital admission? Yes, decreased mobility r/t to oxygen needs and diagnosis.    Patient Demographics        Name Patient ID SSN Gender Identity Birth Date    Valerie May, Valerie May [id number] Female Apr 29, 1970 (51 yrs)             Address Phone Email        [address]  [place] [address] [phone] (H) [email or web address]               Race Occupation Emp Status        Other -- --               Reg Status PCP Date Last Verified Next Review Date      Verified -- 09/15/22 10/15/22              Marital  Status Religion Language        Single -- English               Emergency Contact 1    Concepcion Gillott [phone])  [phone] Judie Petit)                 Electronically signed by Beryle Lathe, RN at 09/19/2022  5:32 PM MDT

## (undated) NOTE — Nursing Note (Signed)
Formatting of this note might be different from the original.  I received a call from the aide that the patient was on the floor in the bathroom at 0915 this morning. Patient was independent with no history of falls and lives independently at baseline. Patient reported that she was "sleepy". She denied hitting her head or experiencing any pain. Charge nurse and physician were notified. Bed alarm placed on patient and patient educated on fall precautions moving forward.     The patient does not have a history of falls. I did complete a risk assessment for falls. A plan of care for falls was documented.    Electronically signed by Tula Nakayama, RN at 09/17/2022  6:12 PM MDT

## (undated) NOTE — Progress Notes (Signed)
Formatting of this note is different from the original.  DAILY PROGRESS NOTE    LORRIN BODNER is a 64 y.o. old female admitted on 09/15/2022 for hypoxia.    Date of Service: 09/18/2022    24 Hour Events:   No major events    SUBJECTIVE  Patient states she is doing okay today.  Having headaches still says she has headaches all the time.  Today noticeably worse than normal.  Still severe.  No nausea or vomiting.  No neck stiffness no fevers or chills.  No photophobia.    MEDICATIONS  Current Facility-Administered Medications   Medication Dose Route Frequency Provider Last Rate Last Admin    acetaminophen (Tylenol) tablet 650 mg  650 mg Oral q4h PRN Dell Ponto, MD   650 mg at 09/18/22 0113    Or    acetaminophen (Tylenol) suspension 650 mg  650 mg Oral q4h PRN Dell Ponto, MD        Or    acetaminophen (Tylenol) suppository 650 mg  650 mg Rectal q4h PRN Dell Ponto, MD        albuterol (2.5 MG/3ML) 0.083% nebulizer solution 2.5 mg  2.5 mg Nebulization q2h PRN Dell Ponto, MD        azithromycin (Zithromax) tablet 500 mg  500 mg Oral Nightly Jonelle Sidle, MD   500 mg at 09/17/22 2132    bisacodyl (Dulcolax) EC tablet 10 mg  10 mg Oral Daily PRN Dell Ponto, MD   10 mg at 09/17/22 2132    budesonide-formoterol (Symbicort) 160-4.5 MCG/ACT inhaler 2 puff  2 puff Inhalation BID PRN Dell Ponto, MD        cefTRIAXone (Rocephin) IVPB 1 g  1 g Intravenous Nightly Dell Ponto, MD   Stopped at 09/17/22 2215    enoxaparin (Lovenox) syringe 40 mg  40 mg Subcutaneous q24h [facility] Dell Ponto, MD   40 mg at 09/18/22 0858    guaiFENesin-codeine (Robitussin-AC) 100-10 MG/5ML syrup 10 mL  10 mL Oral q4h PRN Dell Ponto, MD   10 mL at 09/18/22 0016    ipratropium-albuterol (Duo-Neb) 0.5-2.5 mg/3 mL nebulizer solution 3 mL  3 mL Nebulization q6h Jonelle Sidle, MD   3 mL at 09/18/22 1610    ketorolac (Toradol) injection 15 mg  15 mg Intravenous q8h PRN Harriette Bouillon, MD        loratadine (Claritin) tablet 10 mg   10 mg Oral Daily Dell Ponto, MD   10 mg at 09/18/22 0857    LORazepam (Ativan) tablet 1 mg  1 mg Oral q6h PRN Dell Ponto, MD   1 mg at 09/18/22 9604    melatonin tablet 3 mg  3 mg Oral Nightly PRN Dell Ponto, MD   3 mg at 09/18/22 0113    montelukast (Singulair) tablet 10 mg  10 mg Oral Nightly Dell Ponto, MD   10 mg at 09/17/22 2133    naloxone (Narcan) 0.2 mg in sodium chloride 0.9 % RRT Compounded syringe  0.2 mg Intravenous PRN Dell Ponto, MD        ondansetron (Zofran) injection 4 mg  4 mg Intravenous q6h PRN Jonelle Sidle, MD   4 mg at 09/18/22 5409    Or    ondansetron ODT (Zofran-ODT) disintegrating tablet 4 mg  4 mg Oral q6h PRN Jonelle Sidle, MD   4 mg at 09/17/22 0933    polyethylene glycol (Miralax) packet 17 g  17 g Oral Daily Dell Ponto,  MD   17 g at 09/18/22 0858    predniSONE (Deltasone) tablet 40 mg  40 mg Oral Daily Dell Ponto, MD   40 mg at 09/18/22 0858    venlafaxine XR (Effexor-XR) 24 hr capsule 75 mg  75 mg Oral Daily with breakfast Dell Ponto, MD   75 mg at 09/18/22 0857     OBJECTIVE  Current Vital Signs  Temp: 36.3 C (97.4 F) BP: 130/75 Heart Rate: 90     Resp: 20   (out of 10) Weight: 84.4 kg (186 lb 1.1 oz) SpO2: 90 %     Physical Exam  Gen: NAD  Eyes: sclera anicteric  CV: RRR, no murmur  Resp: lungs are clear with normal respiratory effort  Abdomen: soft, ND, NT, bowel sounds are active  MSK: no LE edema or erythema  Skin: no significant rash  Neuro: speech normal and fluent, no obvious focal deficits  Psych: appropriate affect    Diagnostics and Labs  Relevant diagnostic, laboratory and radiological studies have been reviewed in the Electronic Medical Record from 09/18/2022 and significant for the following:  Lab Results   Component Value Date    WBC 10.9 (H) 09/18/2022    HGB 13.5 09/18/2022    PLT 361 09/18/2022    NA 140 09/18/2022    K 4.2 09/18/2022    CL 98 (L) 09/18/2022    CO2 35 (H) 09/18/2022    BUN 10 09/18/2022    CREATININE 0.41 (L) 09/18/2022     AST 25 09/16/2022    ALT 27 09/16/2022    ALKPHOS 101 09/16/2022    ALBUMIN 3.7 09/16/2022     Cta chest  IMPRESSION:  1.  No PE through the segmental level.  2.  Focal airspace opacities in both lung bases indicate pneumonia.    Electronically signed by: Madalyn Rob, MD    ASSESSMENT & PLAN  Ms. EMIL KLASSEN is a 14 y.o. 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    #Acute hypercapnic hypoxic respiratory failure  #Suspected pneumonia  -Symptoms concerning for viral infection however viral panel negative.   -procalcitonin mildly elevated  -continue ceftriaxone, azithromycin, prednisone, today is day 3  -wean oxygen as tolerated.  -Bicarb up trending and has VBG consistent with compensated hypercapnia. She has likely OSA and has not been compliant with her CPAP in hospital that is contributing to this, no wheezing on exam. Has been receiving occasional opiate which has been stopped.   - encourage CPAP use at night, as I suspect that in some degree contributing to her ongoing hypoxia.   -Robitussin AC for cough.  APAP for fever.  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  -Not wheezing on exam.    -Continue home Singulair, Symbicort, as needed albuterol.    #Right upper quadrant discomfort  -Patient does not have gallbladder.    -LFTs unremarkable.   -likely referred pain from her lower lobe pneumonia.  Treat pneumonia as above.  Low-dose oxycodone added to APAP due to patient complaint of uncontrolled pain.    # right ear pain:  # headache:  -states this has been present since covid.  Has seen two ent providers without clear diagnosis  - also reports history  of migraine headaches and takes sumatriptan and excedrine for this. Improved yesterday with toradol.  -elevated CO2 could be contributing to the headache somewhat but is reportedly not dissimilar to normal   -prn tylenol, toradol PRN  - stop oxycodone    #Chronic constipation  Schedule MiraLAX, as needed Dulcolax ordered.    #Suspect OSA  -continued desatting significantly while sleeping.    -OSA protocol ordered.    -Recommend outpatient sleep study.    DVT Prophylaxis:  Lovenox   Code Status: Full Code   Surrogate decision maker designated as her son Barbara Cower, number in chart.    Harriette Bouillon, MD  Hospitalist    Electronically signed by Harriette Bouillon, MD at 09/20/2022  8:08 AM MDT

## (undated) NOTE — Progress Notes (Signed)
Formatting of this note is different from the original.  DAILY PROGRESS NOTE    Valerie May is a 22 y.o. old female admitted on 09/15/2022 for hypoxia.    Date of Service: 09/16/2022    24 Hour Events:   admitted    SUBJECTIVE  She says she has a right ear ache and headache. She says she has had this since her severe covid, and has seen two ent doctors and has not been told what the etiology is. She also complains of a headache. She denies sob.     MEDICATIONS  Current Facility-Administered Medications   Medication Dose Route Frequency Provider Last Rate Last Admin    acetaminophen (Tylenol) tablet 650 mg  650 mg Oral q4h PRN Dell Ponto, MD   650 mg at 09/16/22 1610    Or    acetaminophen (Tylenol) suspension 650 mg  650 mg Oral q4h PRN Dell Ponto, MD        Or    acetaminophen (Tylenol) suppository 650 mg  650 mg Rectal q4h PRN Dell Ponto, MD        albuterol (2.5 MG/3ML) 0.083% nebulizer solution 2.5 mg  2.5 mg Nebulization q2h PRN Dell Ponto, MD        azithromycin (Zithromax) tablet 500 mg  500 mg Oral Nightly Jonelle Sidle, MD        bisacodyl (Dulcolax) EC tablet 10 mg  10 mg Oral Daily PRN Dell Ponto, MD   10 mg at 09/16/22 0732    budesonide-formoterol (Symbicort) 160-4.5 MCG/ACT inhaler 2 puff  2 puff Inhalation BID PRN Dell Ponto, MD        cefTRIAXone (Rocephin) IVPB 1 g  1 g Intravenous Nightly Dell Ponto, MD        enoxaparin (Lovenox) syringe 40 mg  40 mg Subcutaneous q24h [facility] Dell Ponto, MD   40 mg at 09/16/22 0859    guaiFENesin-codeine (Robitussin-AC) 100-10 MG/5ML syrup 10 mL  10 mL Oral q4h PRN Dell Ponto, MD   10 mL at 09/16/22 0740    loratadine (Claritin) tablet 10 mg  10 mg Oral Daily Dell Ponto, MD   10 mg at 09/16/22 9604    LORazepam (Ativan) tablet 1 mg  1 mg Oral q6h PRN Dell Ponto, MD   1 mg at 09/16/22 1121    melatonin tablet 3 mg  3 mg Oral Nightly PRN Dell Ponto, MD   3 mg at 09/16/22 0304    montelukast (Singulair) tablet 10 mg  10 mg Oral Nightly  Dell Ponto, MD        naloxone (Narcan) 0.2 mg in sodium chloride 0.9 % RRT Compounded syringe  0.2 mg Intravenous PRN Dell Ponto, MD        ondansetron (Zofran) injection 4 mg  4 mg Intravenous q6h PRN Jonelle Sidle, MD        Or    ondansetron ODT (Zofran-ODT) disintegrating tablet 4 mg  4 mg Oral q6h PRN Jonelle Sidle, MD   4 mg at 09/16/22 5409    oxyCODONE (Roxicodone) immediate release tablet 2.5 mg  2.5 mg Oral q4h PRN Dell Ponto, MD        Or    oxyCODONE (Roxicodone) immediate release tablet 5 mg  5 mg Oral q4h PRN Dell Ponto, MD   5 mg at 09/16/22 0732    polyethylene glycol (Miralax) packet 17 g  17 g Oral Daily Dell Ponto, MD   17 g at 09/16/22 [phone]  predniSONE (Deltasone) tablet 40 mg  40 mg Oral Daily Dell Ponto, MD   40 mg at 09/16/22 0857    venlafaxine XR (Effexor-XR) 24 hr capsule 75 mg  75 mg Oral Daily with breakfast Dell Ponto, MD   75 mg at 09/16/22 0858     OBJECTIVE  Current Vital Signs  Temp: 35.8 C (96.5 F) BP: 110/61 Heart Rate: 102     Resp: 20   (out of 10) Weight: 84.4 kg (186 lb 1.1 oz) SpO2: 90 %     Physical Exam  Gen: appears comfortable, awake, alert and interactive  Eyes: sclera anicteric  CV: RRR, no murmur  Resp: lungs are clear with normal respiratory effort  Abdomen: soft, ND, NT, bowel sounds are active  MSK: no LE edema or erythema  Skin: no significant rash  Neuro: speech normal and fluent, no obvious focal deficits  Psych: appropriate affect    Diagnostics and Labs  Relevant diagnostic, laboratory and radiological studies have been reviewed in the Electronic Medical Record from 09/16/2022 and significant for the following:  Lab Results   Component Value Date    WBC 8.7 09/16/2022    HGB 13.0 09/16/2022    PLT 333 09/16/2022    NA 141 09/16/2022    K 3.9 09/16/2022    CL 106 09/16/2022    CO2 26 09/16/2022    BUN 9 09/16/2022    CREATININE 0.56 09/16/2022    AST 25 09/16/2022    ALT 27 09/16/2022    ALKPHOS 101 09/16/2022    ALBUMIN 3.7  09/16/2022     Cta chest  IMPRESSION:  1.  No PE through the segmental level.  2.  Focal airspace opacities in both lung bases indicate pneumonia.    Electronically signed by: Madalyn Rob, MD    ASSESSMENT & PLAN  Valerie May is a 23 y.o. 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    #Acute hypoxic respiratory failure  #Suspected pneumonia  -Symptoms concerning for viral infection however viral panel negative.   -procalcitonin mildly elevated  -continue ceftriaxone, azithromycin, prednisone, today is day 2  -wean oxygen as tolerated.  -Robitussin AC for cough.  APAP for fever.    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  -Not wheezing on exam.    -Continue home Singulair, Symbicort, as needed albuterol.    #Right upper quadrant discomfort  -Patient does not have gallbladder.    -LFTs unremarkable.   -likely referred pain from her lower lobe pneumonia.  Treat pneumonia as above.  Low-dose oxycodone added to APAP due to patient complaint of uncontrolled pain.    # right ear pain:  # headache:  -states this has been present since covid. Has seen two ent providers without clear diagnosis  -unclear etiology  -prn tylenol, toradol x 1    #Chronic constipation  Schedule MiraLAX, as needed Dulcolax ordered.    #Suspect OSA  -continued desatting significantly while sleeping.    -OSA protocol ordered.    -Recommend outpatient sleep study.    DVT Prophylaxis:  Lovenox   Code Status: Full Code   Surrogate decision maker designated as her son Barbara Cower, number in chart.  Christopher C. Manson Passey, M.D.  Hospitalist    Electronically signed by Jonelle Sidle, MD at 09/16/2022  2:28 PM MDT
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
## (undated) NOTE — Nursing Note (Signed)
Formatting of this note might be different from the original.  -Patient reported headache at 10/10 in the AM, gave Tylenol and stated 8/10 relief.  -O2 titrated from 3L to 3.5 to maintain O2 >90. RT recommends 4L for home O2.  -Patient ETCO2 elevated- MD notified.  -Patient expressed desire to not be woken up, MD notified.   -Patient expressed wanting to go home today, went over plan of care and verbalized understanding.  Electronically signed by Cyndia Skeeters, RN at 09/19/2022  6:08 PM MDT

## (undated) NOTE — Care Plan (Signed)
Formatting of this note might be different from the original.  The patient is Moderately Stable - Low risk of patient condition declining or worsening  Problem: Pain - Adult  Goal: Verbalizes/displays adequate comfort level or baseline comfort level  Outcome: Progressing    Problem: Infection - Adult  Goal: Absence of infection at discharge  Outcome: Progressing    Problem: Safety - Adult  Goal: Free from fall injury  Outcome: Progressing    Problem: Respiratory - Adult  Goal: Achieves optimal ventilation and oxygenation  Outcome: Progressing        Electronically signed by Saintclair Halsted, RN at 09/18/2022 11:27 PM MDT

## (undated) NOTE — Care Plan (Signed)
Formatting of this note might be different from the original.  The patient is Moderately Stable - Low risk of patient condition declining or worsening    Problem: Infection - Adult  Goal: Absence of infection at discharge  Outcome: Maintaining    Problem: Safety - Adult  Goal: Free from fall injury  Outcome: Maintaining    Problem: Pain - Adult  Goal: Verbalizes/displays adequate comfort level or baseline comfort level  Outcome: Progressing    Problem: Respiratory - Adult  Goal: Achieves optimal ventilation and oxygenation  Outcome: Progressing  Flowsheets (Taken 09/16/2022 0740)  Achieves optimal ventilation and oxygenation:   Assess for changes in respiratory status   Oxygen supplementation based on oxygen saturation or arterial blood gases   Assess and instruct to report shortness of breath or any respiratory difficulty    Problem: Gastrointestinal - Adult  Goal: Maintains or returns to baseline bowel function  Outcome: Progressing    Electronically signed by Lacinda Axon, RN at 09/16/2022 12:09 PM MDT

## (undated) NOTE — Care Plan (Signed)
Formatting of this note might be different from the original.    Problem: Pain - Adult  Goal: Verbalizes/displays adequate comfort level or baseline comfort level  Outcome: Progressing  Flowsheets (Taken 09/19/2022 2356)  Verbalizes/displays adequate comfort level or baseline comfort level:   Encourage patient to monitor pain and request assistance   Assess pain using appropriate pain scale   Administer analgesics based on type and severity of pain and evaluate response   Implement non-pharmacological measures as appropriate and evaluate response   Consider cultural and social influences on pain and pain management    Problem: Respiratory - Adult  Goal: Achieves optimal ventilation and oxygenation  Outcome: Progressing  Flowsheets (Taken 09/19/2022 2356)  Achieves optimal ventilation and oxygenation:   Assess for changes in respiratory status   Assess for changes in mentation and behavior   Position to facilitate oxygenation and minimize respiratory effort   Oxygen supplementation based on oxygen saturation or arterial blood gases   Encourage broncho-pulmonary hygiene including cough, deep breathe, incentive spirometry   Assess and instruct to report shortness of breath or any respiratory difficulty   Respiratory therapy support as indicated    Electronically signed by Mackey Birchwood, RN at 09/19/2022 11:57 PM MDT

## (undated) NOTE — Care Plan (Signed)
Formatting of this note might be different from the original.  The patient is Moderately Stable - Low risk of patient condition declining or worsening  Problem: Pain - Adult  Goal: Verbalizes/displays adequate comfort level or baseline comfort level  Outcome: Progressing    Problem: Infection - Adult  Goal: Absence of infection at discharge  Outcome: Progressing    Problem: Safety - Adult  Goal: Free from fall injury  Outcome: Progressing    Problem: Respiratory - Adult  Goal: Achieves optimal ventilation and oxygenation  Outcome: Progressing        Electronically signed by Saintclair Halsted, RN at 09/18/2022 12:25 AM MDT

## (undated) NOTE — Progress Notes (Signed)
Formatting of this note is different from the original.    Case Management Initial Assessment for MEI SUITS    08/20: CM met with Ms. Ocanas this morning. The patient reports she has stable living arrangments in [place]. She currently works with her friend Madeline([phone]) and she will provide a ride home upon discharge to [address], [place]. Patient denies use of O2 at home, DME for ambulation and is independent with all ADL's. Cm will follow for any identified [place] needs.     Pre-admission Assessment and Social Determinants of Health (SDOH)    Patient's age: 43 y.o.     Assessment of acts of daily living and capacity for self-care: independent         Prior level of function and use of durable medical equipment: None, per patient    Living Situation at time of admission: Lives Alone    Do you feel your living situation is safe? yes    Are you worried about losing your housing? Not at this time, per pt.     Are you having difficulty paying for the basics like food, housing, medical care, and heating? NO    Has lack of reliable transportation kept you from medical appointments, meetings, work or from getting things needed for daily living? NO    Do you have family or a support system and are they availability to help you at discharge? Yes, friend Madiline [phone]    If for any reason you need help with day-to-day activities such as bathing, preparing meals, shopping, managing finances, etc., can you get the help you need? yes    Pre-Admission [facility]: None    Current Status of Admission  Diagnosis:     Pneumonia of both lungs due to infectious organism, unspecified part of lung   [J96.01] Acute respiratory failure with hypoxia (HCC)     Mental Status: oriented to person, place, time, and general circumstances    Known changes in mobility with hospital admission? Yes, decreased mobility r/t to oxygen needs and diagnosis.    Patient Demographics        Name Patient ID SSN  Gender Identity Birth Date    Mattalynn, Crandle [id number] Female [birth year] (51 yrs)             Address Phone Email        [address]  [place] [address] [phone] (H) [email or web address]               Race Occupation Emp Status        Other -- --               Reg Status PCP Date Last Verified Next Review Date      Verified -- 09/15/22 10/15/22              Marital Status Religion Language        Single -- English               Emergency Contact 1    Naara Kelty [phone])  [phone] Judie Petit)                 Electronically signed by Rowe Clack at 09/16/2022 12:23 PM MDT

## (undated) NOTE — Care Plan (Signed)
Formatting of this note might be different from the original.  The patient is Moderately Stable - Low risk of patient condition declining or worsening  Problem: Pain - Adult  Goal: Verbalizes/displays adequate comfort level or baseline comfort level  Outcome: Progressing  Flowsheets (Taken 09/16/2022 0358 by Robinette Haines, RN)  Verbalizes/displays adequate comfort level or baseline comfort level:   Encourage patient to monitor pain and request assistance   Assess pain using appropriate pain scale   Administer analgesics based on type and severity of pain and evaluate response   Implement non-pharmacological measures as appropriate and evaluate response    Problem: Respiratory - Adult  Goal: Achieves optimal ventilation and oxygenation  Outcome: Progressing  Flowsheets (Taken 09/16/2022 1548 by Lacinda Axon, RN)  Achieves optimal ventilation and oxygenation: Position to facilitate oxygenation and minimize respiratory effort    Problem: Gastrointestinal - Adult  Goal: Maintains or returns to baseline bowel function  Outcome: Progressing  Flowsheets (Taken 09/16/2022 0358 by Robinette Haines, RN)  Maintains or returns to baseline bowel function: Assess bowel function      Electronically signed by Ferd Glassing, RN at 09/17/2022  2:23 AM MDT

## (undated) NOTE — Nursing Note (Signed)
Formatting of this note might be different from the original.  Pt resting in bed tonight. She was compliant for a while with the bipap. She took it off to have a snack and wanted to have a break from it for a bit. Pt did have a small emesis of the food she had just eaten. PRN meds given and bipap put back on for pt to get a bit more sleep tonight. This evening, pt was sad and tearful explaining that she is lonely, she feels like her children do not care about her, her friends are busy with their significant others, her mother has passed away and her ex boyfriend cheated on her with her best friend. This RN provided therapeutic nursing communication. Pt finds comfort in her religion tonight. Pt did complain off and on about pain in her head, neck, chest, back, and abd rated at 10/10 with little relief with pain meds bringing it down to 8/10. Pt is now resting in bed with bipap on. No other needs at this time. Bed in low position, call light within reach, bed alarm on.  Electronically signed by Mackey Birchwood, RN at 09/20/2022  5:22 AM MDT

## (undated) NOTE — Nursing Note (Signed)
Formatting of this note might be different from the original.  Assumed care of pt at 0240 when admitted to the floor from ED. Pt oriented to room, admission questions and four eyes skin assessment complete.    Patient A&Ox4. Increased BP and RR, other VSS on 2L oxygen via nasal cannula, CPAP worn at night. Pt ambulating with SBA to restroom. POC discussed. No further needs at this time. Call light in reach, fall precautions in place.   Electronically signed by Robinette Haines, RN at 09/16/2022  6:52 AM MDT

---

## 1995-09-25 ENCOUNTER — Inpatient Hospital Stay (INDEPENDENT_AMBULATORY_CARE_PROVIDER_SITE_OTHER): Admit: 1995-09-25 | Disposition: A | Discharge status: Home / Self Care | Payer: Self-pay | Source: Ambulatory Visit

## 1996-01-06 ENCOUNTER — Inpatient Hospital Stay (HOSPITAL_BASED_OUTPATIENT_CLINIC_OR_DEPARTMENT_OTHER)
Admission: RE | Admit: 1996-01-06 | Disposition: A | Discharge status: Home Health | Payer: Self-pay | Source: Ambulatory Visit

## 1996-09-08 ENCOUNTER — Ambulatory Visit
Admit: 1996-09-08 | Disposition: A | Discharge status: Home / Self Care | Payer: Self-pay | Source: Ambulatory Visit | Admitting: Internal Medicine

## 1996-12-20 ENCOUNTER — Inpatient Hospital Stay (INDEPENDENT_AMBULATORY_CARE_PROVIDER_SITE_OTHER): Admit: 1996-12-20 | Disposition: A | Discharge status: Home / Self Care | Payer: Self-pay | Source: Ambulatory Visit

## 1996-12-30 ENCOUNTER — Inpatient Hospital Stay (INDEPENDENT_AMBULATORY_CARE_PROVIDER_SITE_OTHER): Admit: 1996-12-30 | Disposition: A | Discharge status: Home / Self Care | Payer: Self-pay | Source: Ambulatory Visit

## 1996-12-30 ENCOUNTER — Ambulatory Visit (HOSPITAL_BASED_OUTPATIENT_CLINIC_OR_DEPARTMENT_OTHER): Admit: 1996-12-30 | Disposition: A | Discharge status: Home / Self Care | Payer: Self-pay | Source: Ambulatory Visit

## 1997-01-27 HISTORY — PX: TUBAL LIGATION: SHX77

## 1997-04-05 ENCOUNTER — Inpatient Hospital Stay (HOSPITAL_BASED_OUTPATIENT_CLINIC_OR_DEPARTMENT_OTHER)
Admission: RE | Admit: 1997-04-05 | Disposition: A | Discharge status: Home / Self Care | Payer: Self-pay | Source: Ambulatory Visit

## 2007-01-28 HISTORY — PX: GALLBLADDER SURGERY: SHX652

## 2007-01-28 HISTORY — PX: NOSE SURGERY: SHX723

## 2007-03-22 ENCOUNTER — Emergency Department: Admit: 2007-03-22 | Payer: Self-pay | Source: Emergency Department | Admitting: Emergency Medicine

## 2007-03-22 LAB — CBC AND DIFFERENTIAL
Basophils Absolute: 0 /mm3 (ref 0.0–0.2)
Basophils: 0 % (ref 0–2)
Eosinophils Absolute: 0.2 /mm3 (ref 0.0–0.7)
Eosinophils: 2 % (ref 0–5)
Granulocytes Absolute: 10.4 /mm3 — ABNORMAL HIGH (ref 1.8–8.1)
Hematocrit: 40.7 % (ref 37.0–47.0)
Hgb: 13.5 G/DL (ref 12.0–16.0)
Immature Granulocytes Absolute: 0.1
Immature Granulocytes: 0 %
Lymphocytes Absolute: 0.7 /mm3 (ref 0.5–4.4)
Lymphocytes: 6 % — ABNORMAL LOW (ref 15–41)
MCH: 29.5 PG (ref 28.0–32.0)
MCHC: 33.2 G/DL (ref 32.0–36.0)
MCV: 88.9 FL (ref 80.0–100.0)
MPV: 9.5 FL (ref 9.4–12.3)
Monocytes Absolute: 0.4 /mm3 (ref 0.0–1.2)
Monocytes: 4 % (ref 0–11)
Neutrophils %: 89 % — ABNORMAL HIGH (ref 52–75)
Platelets: 331 /mm3 (ref 140–400)
RBC: 4.58 /mm3 (ref 4.20–5.40)
RDW: 11.9 % (ref 11.5–15.0)
WBC: 11.69 /mm3 — ABNORMAL HIGH (ref 3.50–10.80)

## 2007-03-22 LAB — URINALYSIS WITH MICROSCOPIC
Bilirubin, UA: NEGATIVE
Glucose, UA: NEGATIVE
Ketones UA: NEGATIVE
Leukocyte Esterase, UA: NEGATIVE
Nitrite, UA: NEGATIVE
Specific Gravity UA POCT: 1.021 (ref 1.001–1.035)
Urine pH: >=9 (ref 5.0–8.0)
Urobilinogen, UA: 0.2

## 2007-03-22 LAB — COMPREHENSIVE METABOLIC PANEL
ALT: 39 U/L — ABNORMAL HIGH (ref 3–36)
AST (SGOT): 26 U/L (ref 10–41)
Albumin/Globulin Ratio: 1.5 (ref 1.1–1.8)
Albumin: 4.7 g/dL (ref 3.4–4.9)
Alkaline Phosphatase: 78 U/L (ref 43–112)
BUN: 11 mg/dL (ref 8–20)
Bilirubin, Total: 0.6 mg/dL (ref 0.1–1.0)
CO2: 26 mEq/L (ref 21–30)
Calcium: 9.3 mg/dL (ref 8.6–10.2)
Chloride: 104 mEq/L (ref 98–107)
Creatinine: 0.7 mg/dL (ref 0.6–1.5)
Globulin: 3.1 g/dL (ref 2.0–3.7)
Glucose: 94 mg/dL (ref 70–100)
Potassium: 4.1 mEq/L (ref 3.6–5.0)
Protein, Total: 7.8 g/dL (ref 6.0–8.0)
Sodium: 140 mEq/L (ref 136–146)

## 2007-03-22 LAB — AMYLASE: Amylase: 54 U/L (ref 5–90)

## 2007-03-22 LAB — GFR

## 2007-03-22 LAB — LIPASE: Lipase: 81 U/L (ref 32–219)

## 2007-06-15 LAB — CBC AND DIFFERENTIAL
Basophils Absolute: 0 /mm3 (ref 0.0–0.2)
Basophils: 0 % (ref 0–2)
Eosinophils Absolute: 0.3 /mm3 (ref 0.0–0.7)
Eosinophils: 4 % (ref 0–5)
Granulocytes Absolute: 6.2 /mm3 (ref 1.8–8.1)
Hematocrit: 41.7 % (ref 37.0–47.0)
Hgb: 14 G/DL (ref 12.0–16.0)
Immature Granulocytes Absolute: 0
Immature Granulocytes: 1 %
Lymphocytes Absolute: 1.6 /mm3 (ref 0.5–4.4)
Lymphocytes: 18 % (ref 15–41)
MCH: 29.5 PG (ref 28.0–32.0)
MCHC: 33.6 G/DL (ref 32.0–36.0)
MCV: 88 FL (ref 80.0–100.0)
MPV: 9.4 FL (ref 9.4–12.3)
Monocytes Absolute: 0.6 /mm3 (ref 0.0–1.2)
Monocytes: 7 % (ref 0–11)
Neutrophils %: 71 % (ref 52–75)
Platelets: 373 /mm3 (ref 140–400)
RBC: 4.74 /mm3 (ref 4.20–5.40)
RDW: 11.9 % (ref 11.5–15.0)
WBC: 8.82 /mm3 (ref 3.50–10.80)

## 2007-06-15 LAB — BASIC METABOLIC PANEL
BUN: 7 mg/dL — ABNORMAL LOW (ref 8–20)
CO2: 25 mEq/L (ref 21–30)
Calcium: 9.5 mg/dL (ref 8.6–10.2)
Chloride: 101 mEq/L (ref 98–107)
Creatinine: 0.6 mg/dL (ref 0.6–1.5)
Glucose: 88 mg/dL (ref 70–100)
Potassium: 3.8 mEq/L (ref 3.6–5.0)
Sodium: 138 mEq/L (ref 136–146)

## 2007-06-15 LAB — GFR

## 2007-06-16 ENCOUNTER — Inpatient Hospital Stay
Admission: EM | Admit: 2007-06-16 | Disposition: A | Discharge status: Home / Self Care | Payer: Self-pay | Source: Emergency Department | Admitting: Surgery

## 2007-06-16 LAB — URINALYSIS WITH MICROSCOPIC
Bilirubin, UA: NEGATIVE
Blood, UA: NEGATIVE
Glucose, UA: NEGATIVE
Ketones UA: NEGATIVE
Leukocyte Esterase, UA: NEGATIVE
Nitrite, UA: NEGATIVE
Protein, UR: NEGATIVE
Specific Gravity UA POCT: 1.018 (ref 1.001–1.035)
Urine pH: 7 (ref 5.0–8.0)
Urobilinogen, UA: 1

## 2007-06-16 LAB — HEPATIC FUNCTION PANEL
ALT: 38 U/L — ABNORMAL HIGH (ref 3–36)
AST (SGOT): 27 U/L (ref 10–41)
Albumin/Globulin Ratio: 1.4 (ref 1.1–1.8)
Albumin: 4.2 g/dL (ref 3.4–4.9)
Alkaline Phosphatase: 80 U/L (ref 43–112)
Bilirubin Direct: 0 mg/dL (ref 0.0–0.3)
Bilirubin Indirect: 0.5 mg/dL (ref 0.1–0.9)
Bilirubin, Total: 0.5 mg/dL (ref 0.1–1.0)
Globulin: 3 g/dL (ref 2.0–3.7)
Protein, Total: 7.2 g/dL (ref 6.0–8.0)

## 2007-06-16 LAB — I-STAT CG4 VENOUS CARTRIDGE
Base Excess, Ven: -2 mEq/L
FIO2: 21
HCO3, Ven: 25.2 mEq/L
Lactic Acid: 0.7 mEq/L (ref 0.5–2.2)
O2 Sat, Venous: 79 %
Temperature: 98.4
Venous Total CO2: 27 mEq/L
pCO2, Venous: 51.2 mmHg
pH, Ven: 7.3
pO2, Venous: 48 mmHg

## 2007-06-16 LAB — LIPASE: Lipase: 82 U/L (ref 32–219)

## 2007-06-16 LAB — HCG QUANTITATIVE LEVEL - FH CERNER: TUMOR MARKER BHCG QUANT: 0 m[IU]/mL

## 2007-06-17 LAB — CBC WITH MANUAL DIFFERENTIAL
Eosinophils %: 7 % — ABNORMAL HIGH (ref 0–5)
Eosinophils Absolute Manual: 0.4
Granulocytes #: 2.92
Hematocrit: 34.7 % — ABNORMAL LOW (ref 37.0–47.0)
Hgb: 11.3 G/DL — ABNORMAL LOW (ref 12.0–16.0)
LYMPH#: 1.66
Lymphocytes Manual: 30 % (ref 15–41)
MCH: 29.4 PG (ref 28.0–32.0)
MCHC: 32.6 G/DL (ref 32.0–36.0)
MCV: 90.1 FL (ref 80.0–100.0)
MPV: 9.2 FL — ABNORMAL LOW (ref 9.4–12.3)
Monocytes Absolute Calculated: 0.55
Monocytes Manual: 10 % (ref 0–11)
Neutrophils %: 53 % (ref 52–75)
Platelet Estimate: NORMAL
Platelets: 322 /mm3 (ref 140–400)
RBC Morphology: NORMAL
RBC: 3.85 /mm3 — ABNORMAL LOW (ref 4.20–5.40)
RDW: 12.1 % (ref 11.5–15.0)
WBC: 5.54 /mm3 (ref 3.50–10.80)

## 2007-06-18 LAB — CBC AND DIFFERENTIAL
Basophils Absolute: 0 /mm3 (ref 0.0–0.2)
Basophils: 0 % (ref 0–2)
Eosinophils Absolute: 0.3 /mm3 (ref 0.0–0.7)
Eosinophils: 4 % (ref 0–5)
Granulocytes Absolute: 4 /mm3 (ref 1.8–8.1)
Hematocrit: 41.2 % (ref 37.0–47.0)
Hgb: 13.9 G/DL (ref 12.0–16.0)
Immature Granulocytes Absolute: 0
Immature Granulocytes: 0 %
Lymphocytes Absolute: 2.2 /mm3 (ref 0.5–4.4)
Lymphocytes: 31 % (ref 15–41)
MCH: 29.7 PG (ref 28.0–32.0)
MCHC: 33.7 G/DL (ref 32.0–36.0)
MCV: 88 FL (ref 80.0–100.0)
MPV: 9.1 FL — ABNORMAL LOW (ref 9.4–12.3)
Monocytes Absolute: 0.5 /mm3 (ref 0.0–1.2)
Monocytes: 8 % (ref 0–11)
Neutrophils %: 57 % (ref 52–75)
Platelets: 343 /mm3 (ref 140–400)
RBC: 4.68 /mm3 (ref 4.20–5.40)
RDW: 11.9 % (ref 11.5–15.0)
WBC: 7.08 /mm3 (ref 3.50–10.80)

## 2007-06-18 LAB — SEDIMENTATION RATE: Sed Rate: 18 MM/HR (ref 0–26)

## 2007-06-20 LAB — URINALYSIS
Bilirubin, UA: NEGATIVE
Blood, UA: NEGATIVE
Glucose, UA: NEGATIVE
Ketones UA: NEGATIVE
Leukocyte Esterase, UA: NEGATIVE
Nitrite, UA: NEGATIVE
Protein, UR: NEGATIVE
Specific Gravity UA POCT: 1.012 (ref 1.001–1.035)
Urine pH: 7.5 (ref 5.0–8.0)
Urobilinogen, UA: 0.2 EU/dL (ref 0.2–1.0)

## 2007-06-22 LAB — ANA SCREEN REFLEX

## 2007-07-05 ENCOUNTER — Inpatient Hospital Stay
Admission: EM | Admit: 2007-07-05 | Disposition: A | Discharge status: Home / Self Care | Payer: Self-pay | Source: Emergency Department | Admitting: Surgery

## 2007-07-05 LAB — CBC AND DIFFERENTIAL
Basophils Absolute: 0.1 /mm3 (ref 0.0–0.2)
Basophils: 1 % (ref 0–2)
Eosinophils Absolute: 0.5 /mm3 (ref 0.0–0.7)
Eosinophils: 5 % (ref 0–5)
Granulocytes Absolute: 5.2 /mm3 (ref 1.8–8.1)
Hematocrit: 37.6 % (ref 37.0–47.0)
Hgb: 12.8 G/DL (ref 12.0–16.0)
Immature Granulocytes Absolute: 0
Immature Granulocytes: 0 %
Lymphocytes Absolute: 2.9 /mm3 (ref 0.5–4.4)
Lymphocytes: 32 % (ref 15–41)
MCH: 29.8 PG (ref 28.0–32.0)
MCHC: 34 G/DL (ref 32.0–36.0)
MCV: 87.6 FL (ref 80.0–100.0)
MPV: 9.4 FL (ref 9.4–12.3)
Monocytes Absolute: 0.5 /mm3 (ref 0.0–1.2)
Monocytes: 5 % (ref 0–11)
Neutrophils %: 57 % (ref 52–75)
Platelets: 401 /mm3 — ABNORMAL HIGH (ref 140–400)
RBC: 4.29 /mm3 (ref 4.20–5.40)
RDW: 12.2 % (ref 11.5–15.0)
WBC: 9.09 /mm3 (ref 3.50–10.80)

## 2007-07-05 LAB — COMPREHENSIVE METABOLIC PANEL
ALT: 77 U/L — ABNORMAL HIGH (ref 3–36)
AST (SGOT): 31 U/L (ref 10–41)
Albumin/Globulin Ratio: 1.6 (ref 1.1–1.8)
Albumin: 4.5 g/dL (ref 3.4–4.9)
Alkaline Phosphatase: 100 U/L (ref 43–112)
BUN: 8 mg/dL (ref 8–20)
Bilirubin, Total: 0.4 mg/dL (ref 0.1–1.0)
CO2: 24 mEq/L (ref 21–30)
Calcium: 10 mg/dL (ref 8.6–10.2)
Chloride: 105 mEq/L (ref 98–107)
Creatinine: 0.7 mg/dL (ref 0.6–1.5)
Globulin: 2.9 g/dL (ref 2.0–3.7)
Glucose: 95 mg/dL (ref 70–100)
Potassium: 3.7 mEq/L (ref 3.6–5.0)
Protein, Total: 7.4 g/dL (ref 6.0–8.0)
Sodium: 145 mEq/L (ref 136–146)

## 2007-07-05 LAB — PT AND APTT
PT INR: 1.2 {INR} — ABNORMAL HIGH (ref 0.9–1.1)
PT: 13.7 s — ABNORMAL HIGH (ref 10.8–13.3)
PTT: 28 s (ref 21–32)

## 2007-07-05 LAB — GFR

## 2007-07-06 LAB — COMPREHENSIVE METABOLIC PANEL
ALT: 100 U/L — ABNORMAL HIGH (ref 3–36)
AST (SGOT): 100 U/L — ABNORMAL HIGH (ref 10–41)
Albumin/Globulin Ratio: 1.4 (ref 1.1–1.8)
Albumin: 3.8 g/dL (ref 3.4–4.9)
Alkaline Phosphatase: 101 U/L (ref 43–112)
BUN: 8 mg/dL (ref 8–20)
Bilirubin, Total: 0.4 mg/dL (ref 0.1–1.0)
CO2: 22 mEq/L (ref 21–30)
Calcium: 9 mg/dL (ref 8.6–10.2)
Chloride: 108 mEq/L — ABNORMAL HIGH (ref 98–107)
Creatinine: 0.7 mg/dL (ref 0.6–1.5)
Globulin: 2.8 g/dL (ref 2.0–3.7)
Glucose: 126 mg/dL — ABNORMAL HIGH (ref 70–100)
Potassium: 3.6 mEq/L (ref 3.6–5.0)
Protein, Total: 6.6 g/dL (ref 6.0–8.0)
Sodium: 143 mEq/L (ref 136–146)

## 2007-07-06 LAB — LIPASE: Lipase: 134 U/L (ref 32–219)

## 2007-07-06 LAB — CBC
Hematocrit: 35.1 % — ABNORMAL LOW (ref 37.0–47.0)
Hgb: 11.8 G/DL — ABNORMAL LOW (ref 12.0–16.0)
MCH: 29.6 PG (ref 28.0–32.0)
MCHC: 33.6 G/DL (ref 32.0–36.0)
MCV: 88.2 FL (ref 80.0–100.0)
MPV: 9.5 FL (ref 9.4–12.3)
Platelets: 369 /mm3 (ref 140–400)
RBC: 3.98 /mm3 — ABNORMAL LOW (ref 4.20–5.40)
RDW: 12.1 % (ref 11.5–15.0)
WBC: 9 /mm3 (ref 3.50–10.80)

## 2007-07-06 LAB — AMYLASE: Amylase: 68 U/L (ref 5–90)

## 2007-07-06 LAB — MAGNESIUM: Magnesium: 1.8 mg/dL (ref 1.6–2.3)

## 2007-07-07 LAB — URINALYSIS
Bilirubin, UA: NEGATIVE
Blood, UA: NEGATIVE
Glucose, UA: NEGATIVE
Ketones UA: NEGATIVE
Leukocyte Esterase, UA: NEGATIVE
Nitrite, UA: NEGATIVE
Protein, UR: NEGATIVE
Specific Gravity UA POCT: 1.007 (ref 1.001–1.035)
Urine pH: 7 (ref 5.0–8.0)
Urobilinogen, UA: 0.2

## 2007-07-08 ENCOUNTER — Ambulatory Visit
Admit: 2007-07-08 | Disposition: A | Discharge status: Home / Self Care | Payer: Self-pay | Source: Other Acute Inpatient Hospital | Admitting: Surgery

## 2009-05-05 ENCOUNTER — Emergency Department: Admit: 2009-05-05 | Payer: Self-pay | Source: Emergency Department | Admitting: Emergency Medicine

## 2009-08-06 ENCOUNTER — Emergency Department: Admit: 2009-08-06 | Payer: Self-pay | Source: Emergency Department | Admitting: Emergency Medicine

## 2010-10-28 LAB — ECG 12-LEAD
Atrial Rate: 84 {beats}/min
P Axis: 31 degrees
P-R Interval: 134 ms
Q-T Interval: 366 ms
QRS Duration: 98 ms
QTC Calculation (Bezet): 432 ms
R Axis: 39 degrees
T Axis: 15 degrees
Ventricular Rate: 84 {beats}/min

## 2010-11-15 NOTE — Op Note (Signed)
Account Number: [id number]      Document ID: [id number]      Admit Date: 06/16/2007      Procedure Date: 06/22/2007            Patient Location: [id number]      Patient Type: I            SURGEON: Despina Hidden MD      ASSISTANT:                  PREOPERATIVE DIAGNOSIS:      Biliary dyskinesia.            POSTOPERATIVE DIAGNOSIS:      Biliary dyskinesia.            TITLE OF PROCEDURE:      Laparoscopic cholecystectomy.            ANESTHESIA:      General with endotracheal intubation.            DVT PROPHYLAXIS:      Pneumatic TEDs.            ANTIBIOTICS:      Not indicated.            DESCRIPTION OF PROCEDURE:      After informed consent was obtained, all questions were answered, the      patient was taken to the operating room, placed in a supine position.  A      general anesthetic with endotracheal intubation was now adequately induced.       Pneumatic TEDs were applied and functioning.  She was prepped and draped      in the usual sterile fashion.  I infiltrated the umbilical area with a      local anesthetic solution.  I created a small periumbilical incision in the      12:00 position and dissected down to the fascia.  Traction sutures were      placed using 0 Vicryl.  A small incision was created.  The peritoneal      cavity was entered.  A blunt-tip trocar was introduced and the abdomen was      insufflated with carbon dioxide.  Under direct observation, additional      trocars were placed in their usual fashion without difficulty.  The patient      was placed in a reverse Trendelenburg position.  The gallbladder was      grasped and retracted maximally cephalad.  The infundibulum was grasped and      retracted caudad and laterally. I incised the peritoneum both medially and      laterally and opened up the entire triangle of Calot.  I incised the      peritoneum both medially and laterally and opened up the triangle.  I      identified the cystic artery coursing up onto the medial body of the      gallbladder with  lymph node of Calot attached to it.  The cystic duct I      cleared circumferentially.  I could easily see the gallbladder tapering      down nicely into this.  No other structures remained in the triangle.  I      now clipped the cystic duct with three clips on the cystic duct towards the      common bile duct but none impinging upon that structure, 1 clip on the      cystic duct up towards the gallbladder, and  I transected the cystic duct.      The cystic artery was then clipped with 2 clips proximally, 1 clip distally      and this was transected.  I then dissected the gallbladder free from the      liver bed.  There was no evidence of any bleeding or bile leakage.  I was      pleased with my results.  I took the gallbladder out through the      infraumbilical incision.  The fascia was closed with a 0 Vicryl in      interrupted figure-of-eight fashion.  All skin incisions were closed using      Monocryl, Mastisol and Steri-Strips were provided.  The patient was      awakened, extubated, transferred to a stretcher, taken to recovery room.      All counts were correct.                        Electronic Signing Provider      _______________________________     Date/Time Signed: _____________      Despina Hidden MD [phone])            D:  06/22/2007 15:48 PM by Kathlyn [place]. Icelynn Onken, MD [phone])      T:  06/22/2007 16:17 PM by          Everlean Cherry: [id number]) (Doc ID: [id number])                  cc:

## 2010-11-15 NOTE — Op Note (Signed)
Account Number: [id number]      Document ID: [id number]      Admit Date: 07/05/2007      Procedure Date: 07/06/2007            Patient Location: [id number]      Patient Type: I            SURGEON: Despina Hidden MD      ASSISTANT:                  PREOPERATIVE DIAGNOSIS:      Periumbilical pain.            POSTOPERATIVE DIAGNOSIS:      Incisional hernia, reducible.            PROCEDURE:      Incisional hernia repair with Composix mesh.            ANESTHESIA:      1%  lidocaine, 0.5% Marcaine IV sedation.            DVT PROPHYLAXIS:      Pneumatic TEDs.            ANTIBIOTICS:      Ancef given preoperatively.            DESCRIPTION OF PROCEDURE:      After informed consent was obtained, the patient was taken to the operating      room.  In the operating room I created an incision through an old scar just      inferior to the umbilicus.  The previous laparoscopic scar was above the      umbilicus, I dissected down, noted a defect in the fascia just at the edge      of the umbilicus.  This was caused by her previous tubal ligation and the      laparoscopic cholecystectomy cephalad to all of this.  Defect was assessed      and it was Elected to use a piece of Composix mesh.  This was placed in the      abdominal cavity, sutured to the fascia with a 2-0 Prolene in simple      interrupted fashion.  The wound was then closed in a multiple layer fashion      followed by Mastisol and Steri-Strips.  All counts were correct.  There      were no complications.  Blood loss was minimal.  The patient tolerated      well.                        Electronic Signing Provider      _______________________________     Date/Time Signed: _____________      Despina Hidden MD [phone])            D:  07/06/2007 19:31 PM by Kathlyn Sacramento. Aaiden Depoy, MD [phone])      T:  07/06/2007 23:34 PM by MDI = [id number]          (Conf: [id number]) (Doc ID: [id number])                  cc:

## 2010-11-15 NOTE — H&P (Signed)
Account Number: [id number]      Document ID: [id number]      Admit Date: 07/05/2007            Patient Location: [id number]      Patient Type: I            ATTENDING PHYSICIAN: Lorenza Chick, MD                  CHIEF COMPLAINT:      Abdominal pain.            HISTORY OF PRESENT ILLNESS:      This 40 year old female underwent a laparoscopic cholecystectomy on May 26      for biliary dyskinesia.  She had presented with abdominal pain of unclear      etiology.  An extensive workup finally demonstrated that her symptoms were      reproduced with the administration of cholecystokinin, although she had a      normal ejection fraction.  She subsequently was discharged and was seen by      me approximately 2 weeks later.  She tells me that the pain that she was      admitted for was relieved by the laparoscopic cholecystectomy.  However,      she has had persistent pain in her umbilicus.  While she was in the      hospital, she had an umbilical hernia identified; however, this was always      reducible and was not felt to be the obvious source of pain.  She comes in      now complaining of persistent and significant umbilical pain.            PAST MEDICAL HISTORY:      Significant for anxiety.            PAST SURGICAL HISTORY:      Significant for liposuction, tubal ligation, and laparoscopic      cholecystectomy.            MEDICATIONS:      Percocet.            ALLERGIES:      ASPIRIN.            SOCIAL HISTORY:      She smokes 2 cigarettes a month.  She drinks occasionally.            PHYSICAL EXAMINATION:      VITAL SIGNS:  Blood pressure was 105/70, pulse 104, temperature 98.3,      respirations 18.      LUNGS:  Clear.      CARDIAC:  S1, S2.  Regular rate and rhythm.      ABDOMEN:  Soft, but she was exquisitely tender at the umbilicus.  She could      not generate any significant cough for me to re-identify her hernia.            IMPRESSION:      Localized abdominal pain at the umbilicus most likely due to her umbilical       hernia.            PLAN:      The patient was admitted from my office, and she has been added on for an      umbilical hernia.  I explained how this would be done under general      anesthesia.  Complications include, but are not limited to; bleeding,      infection, hematoma  or seroma formation, poor wound healing, injury to      underlying bowel with the potential for peritonitis, abscess formation, or      fistula formation.  While I suspect that this is the cause for her pain      given her difficult history, it is also conceivable that this could not      ultimately resolve all of her symptoms.                        Electronic Signing Provider      _______________________________     Date/Time Signed: _____________      Lorenza Chick MD [phone])            D:  07/06/2007 10:23 AM by Lorenza Chick, MD [phone])      T:  07/06/2007 11:09 AM by [id number]          (Conf: [id number]) (Doc ID: [id number])                  cc:

## 2010-11-15 NOTE — Discharge Summary (Signed)
Account Number: [id number]      Document ID: [id number]      Admit Date: 06/16/2007      Discharge Date: 06/24/2007            ATTENDING PHYSICIAN:  Lorenza Chick, MD                  ADMITTING DIAGNOSIS:      Right-sided abdominal pain.            DISCHARGE DIAGNOSIS:      Status post laparoscopic cholecystectomy.            HISTORY OF PRESENT ILLNESS:      This is a 40 year old female who presents with severe, diffuse abdominal      pain for 2 days.  The pain is in her periumbilical and right lower      quadrant.  Mostly she complains of generalized abdominal pain.  The pain      radiates into her back, as well as her groin and vagina.  She also feels      that there is pressure in her vagina that makes her want to push.  She has      had intermittent nausea, and vomiting, as well as some initial diarrhea,      but patient explains diarrhea is resolving.  She is passing gas regularly.      Patient reports fevers, and chills for the past 2 days.  Patient also      complains of dysuria for 2 days as well.  She also complains of pneumaturia      for 2 days.  She denies fecaluria.  She has had similar pain      intermittently, but less severe in February.  Patient states that she went      to the emergency room and was told that she had an umbilical hernia and      appendicitis, but was not operated on at that time.  Patient's last      menstruation was 6 weeks ago.  Upon admission, the patient was seen in the      emergency room, had an initial CT scan, which the appendix initially was      not identified, so could not be excluded at this time.  There was contrast      noted within the vagina.  The source likely the bladder, suggesting a      vesicovaginal fistula initially and the patient was admitted.  Patient was      admitted on Jun 16, 2007.  Patient was seen in house by the in house      surgical team, and on May 21, after the same complaints, patient was      scheduled for a cystogram to rule out vesicovaginal  fistula.  The cystogram      was normal, eliminating the possibility of a vesicovaginal fistula.  At the      same time, the patient had continued pain.  GYN was called in.  GYN did      their own evaluation, and after seeing exam, vesicovaginal fistula was      excluded from the diagnosis.  The patient continued to have pain through      the stay in the hospital.  GI was called due to the increased abdominal      pain with both negative CT scans previously reported and pelvic      ultrasounds, which showed no acute pelvic abnormalities,  along with further      confirmation of no vesicovaginal fistula.  Stool cultures were taken per      GI.  Both ova and parasite and leukocytes were taken.  All were negative      along with C. difficile toxins negative.  A repeat scan was ordered on the      23rd, which identified the appendix at this time, which ruled out      possibility appendicitis, but otherwise the CT was normal findings.  The      patient did relate on the 24th that more and more as the patient tried to      eat that they were reproducing the symptoms.  At this time, we decided to      get a HIDA scan with CCK.  The HIDA scan was performed with CCK.  Upon      injection of the CCK the patient had immediately reducible symptoms.  It      was also noted that sequential imaging obtained of the gallbladder over the      course of 30 minutes demonstrated no significant gallbladder emptying,      which revealed the gallbladder is complete dyskinesia.  So, per our      recommendation, all risk and benefits were explained to the patient, and at      this time the patient was consented for a laparoscopic cholecystectomy on      May 26.  The patient tolerated the procedure well and was readmitted to the      floor for further observation.  The patient's pain was controlled using      p.o. pain medications.  Patient was further observed.  Only discomfort that      remained was the umbilical hernia, which was tiny and  reducible in nature.      It was explained to the patient that this will be fixed at a later date.      That patient needs to heal from the initial procedure.  Will follow up with      Dr. Willeen Cass in 7 to 10 days for postop visit.  Patient's vital signs were      stable.  Laboratory exams within normal limits.  Patient was ambulating.      No shortness of breath or any discomfort at this time.  As per our      recommendations, the patient will be discharged to home with prescription      for Percocet 5/325, 30 tablets, instructions to take 1 to 2 tablets p.o. q.      4 hours p.r.n. pain.  Patient was also instructed to restart all home      medications.  Patient will follow up in the office of Lorenza Chick, MD for      all postoperative care.                        Electronic Signing Provider      _______________________________     Date/Time Signed: _____________      Lorenza Chick MD [phone])            D:  06/24/2007 16:09 PM by Caesar Chestnut. [place], MD ([id number])      T:  06/24/2007 20:49 PM by [id number]          Everlean Cherry: [id number]) (Doc ID: [id number])  cc:

## 2010-11-15 NOTE — H&P (Signed)
Account Number: [id number]      Document ID: [id number]      Admit Date: 06/16/2007            Patient Location: F753-01      Patient Type: V            ATTENDING PHYSICIAN: Lorenza Chick, MD                  HISTORY OF PRESENTING ILLNESS:      Patient is a 40 year old  G5, P4-0-1-4 who presents with 1.5 days of      abdominal pain, subjective fever, bone aches and fevers.  The patient took      Advil, Aleve and Tylenol without relief.  Vomiting constantly.  Diarrhea      yesterday.  Patient states sometimes has urine hesitancy, urge,      incontinence, also stress incontinence with sneezing and coughing.            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  She had 1 SAB, spontaneous.  No      dilatation and curettage.            GYNECOLOGIC HISTORY:      She has irregular menses lately, lasting 4 days.  No STDs.            PAST SURGICAL HISTORY:      She had a tubal ligation 10 years ago; liposuction 2003.            PAST MEDICAL HISTORY:      Possible anxiety, possible cardiac history, unable to explain.  No masses      at this time.            SOCIAL HISTORY:      She smokes 1 cigarette a month.            PHYSICAL EXAMINATION:      VITAL SIGNS:  The patient is afebrile.  Vital signs are all stable.      GENERAL:  Alert and oriented.      ABDOMEN:  Soft.  Diffusely tender to palpation.      PELVIC:  On sterile speculum exam, the patient has no evidence of fistula.      No cervical lesions.  She has no masses.  Cervix long and closed.  Uterus      is about 6 weeks size.            IMPRESSION:      She is a 40 year old with abdominal pain, unlikely gynecologic in origin.      The patient is to follow up as an outpatient for tampon test for complete      evaluation of fistula, as possibly seen on CT scan.  This has been      discussed with Dr. _____ and Dr. Allene Dillon.                        Electronic Signing Provider       _______________________________     Date/Time Signed: _____________      Lorenza Chick MD [phone])            D:  06/16/2007 10:44 AM by Deretha Emory. Vicenta Dunning, MD ([id number])      T:  06/16/2007 11:08 AM by          Marland Kitchen  Conf: [id number]) (Doc ID: [id number])                  cc:

## 2010-11-15 NOTE — H&P (Signed)
Account Number: [id number]      Document ID: [id number]      Admit Date: 06/16/2007            Patient Location: F753-01      Patient Type: I            ATTENDING PHYSICIAN: Lorenza Chick, MD                  CHIEF COMPLAINT:      Abdominal pain.            HISTORY OF PRESENT ILLNESS:      This 40 year old G5, P4 female presented with severe diffuse abdominal pain      for two days.  The pain is in her periumbilical and right lower quadrant      area mostly but she also complains of generalized abdominal pain.  The pain      radiates into her back as well as her groin and vagina.  She also feels      that there is pressure to her vagina that makes her want to "push."  She      has had intermittent nausea and vomiting as well as some initial diarrhea      but which has since resolved.  She is passing gas regularly.  She reports      fevers and chills for two days.  She also complains of dysuria for two      days.  She also complains of dysuria and pneumaturia for two days.  She      denies fecaluria.  She has had similar pain intermittently, although less      severe since February.  She states that she went to an emergency room and      was told that she had an umbilical hernia and appendicitis but was not      operated on.  Her last menstrual period was 6 weeks ago.            FAMILY HISTORY:      Heart problems.            SOCIAL HISTORY:      She smokes 2 cigarettes a month.  She drinks 1 drink per month.            REVIEW OF SYSTEMS:      Significant for some dizziness, occasional tingling in the extremities.      Blood pressure 114/64, pulse 91, temperature 97.7, respiratory rate 20, 99%      on room air.  Last menstrual period was 6 weeks ago.  Beta hCG is negative.                  PHYSICAL EXAMINATION:      SKIN:  Warm.      HEENT:  There was no scleral icterus.      LUNGS:  Clear.      CARDIAC:  S1, S2, regular rate and rhythm.      ABDOMEN:  Firm, distended.  On my examination today, it is nondistended,       however.  There is mild tenderness in the right lower quadrant as well as      the right upper quadrant.            ADMISSION LABS:      White count 8.8, hematocrit 41.7, there was normal diff.  Lipase was 82.      UA was negative.  Pelvic ultrasound showed no  acute abnormality.  The      uterus was mildly enlarged.  CT scan of the abdomen and pelvis did not      identify the appendix but there was no stranding or indication of      inflammatory changes.  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            IMPRESSION:      A 40 year old female with abdominal pain of unclear etiology.  Today, Jun 17, 2007, she remains afebrile with normal vital signs.  Her white count is      decreased to 5000.  I have asked urology to see the patient and we will see      the results of a cystogram being done today as well.                        Electronic Signing Provider      _______________________________     Date/Time Signed: _____________      Lorenza Chick MD [phone])            D:  06/17/2007 09:13 AM by Lorenza Chick, MD [phone])      T:  06/17/2007 10:09 AM by Jonathon Bellows          (Conf: [id number]) (Doc ID: [id number])                  cc:

## 2010-11-15 NOTE — Discharge Summary (Signed)
Account Number: [id number]      Document ID: [id number]      Admit Date: 07/05/2007      Discharge Date: 07/07/2007            ATTENDING PHYSICIAN:  Rich Brave, MD                  DISCHARGE DIAGNOSIS:      Incarcerated umbilical hernia status post umbilical hernia repair.            REASON FOR ADMISSION:      In brief, this is a 40 year old female who underwent a laparoscopic      cholecystectomy on May 26 for biliary dyskinesia.  At that time, she had      presented with abdominal pain of unclear etiology and an extensive workup      demonstrated that her symptoms were reproduced with CCK on a HIDA scan, and      although she did have a normal ejection fraction, she was subsequently      discharged home.  Two weeks later, she returned to clinic stating that the      pain that she had been admitted for was relived but that she is now having      persistent pain in her umbilicus.  The patient had been noted to have an      umbilical hernia during her initial hospitalization.  However, this was      always reducible and was not addressed at that time.  The patient, due to      her significant umbilical pain at this time, was sent to the emergency      department where she was admitted for further workup and treatment of her      illness.            HOSPITAL COURSE:      The patient was admitted to the floor in stable condition.  She had normal      laboratory values.  On hospital day 2, the patient was brought to the      operating room for an umbilical hernia repair, which she underwent without      complications.  She returned to the floor in stable condition and over the      next day was advanced from a clear liquid diet to a regular diet.  Her pain      was controlled on p.o. pain medications, and she was ambulating well.  By      postop day 1, she was deemed stable for discharge to home.            DISCHARGE DISPOSITION:      The patient was discharged home.            FOLLOWUP:      The patient was instructed to  follow up in clinic with Dr. Tasia Catchings in 7 to 10      days.            DISCHARGE MEDICATIONS:      Percocet for pain.                        Electronic Signing Provider      _______________________________     Date/Time Signed: _____________      Despina Hidden MD [phone])            D:  07/15/2007 11:06 AM by Mertie Clause. Merton Border, MD ([id number])  T:  07/15/2007 15:07 PM by [id number]          (Conf: [id number]) (Doc ID: [id number])                  cc:

## 2010-11-15 NOTE — Consults (Signed)
DATE OF BIRTH:                        20-Sep-1970      ADMISSION DATE:                     03/22/2007            PATIENT LOCATION:                    ED            DATE OF CONSULTATION:                03/22/2007      CONSULTANT:                        Darci Current, MD      CONSULTING SERVICE:            CHIEF COMPLAINT:  Abdominal pain.            HISTORY OF PRESENT ILLNESS:  This is a 40 year old Hispanic female with a      37-month history of abdominal pain.  She states that over the last month it      has gotten worse.  She reports nausea and vomiting for the last 3 days.      Denies any shortness of breath, chest pain, diarrhea.  She reports that the      pain is increased when lifting and that has been going on for the whole      time.  She currently denies anorexia and is actually quite hungry, having      been here in the emergency department most of the day.  She has been worked      up and evaluated by the emergency department, and we are consulted for the      concern on a CT reading that perhaps there may be a possible appendicitis.            PAST MEDICAL HISTORY:  Some cardiac problem that she cannot fully explain      that she was being worked up for in [place] 3 years ago.            PAST SURGICAL HISTORY:  Tubal ligation.            MEDICATIONS:  Aspirin and garlic.            ALLERGIES:  She said she is allergic to aspirin, but she takes it every      day.            FAMILY HISTORY:  Noncontributory.            SOCIAL HISTORY:  She is single but has support at home.  Tobacco, she      smokes 1-2 cigarettes a day.  Alcohol 1-3 times a year she drinks.  No      drugs of abuse.            REVIEW OF SYSTEMS:  Neurologic negative.  Pulmonary negative.  CV there is      a possible heart problem.  GU nausea vomiting.  Psychiatric negative.      Constitutional negative.  Musculoskeletal negative.  Hematologic negative.            PHYSICAL EXAMINATION:  Blood pressure is 139/72, pulse of 89, temperature       is  98.5, respirations of 18.  General:  She is alert and oriented,      appropriate, communicates through the interpreter.  Her skin is warm and      dry.  Her HEENT exam is within normal limits.  Chest:  Lungs are clear      bilaterally.  Heart is regular rate and rhythm. Abdomen is soft.  She has      some tenderness in her sort-of mid upper umbilical area and more      periumbilical with a palpable umbilical hernia.  She also has some      bilateral lower quadrant tenderness right greater than left.  Per the ED      record, she had a pelvic exam, which was normal.  Musculoskeletal:  Her      extremities are no clubbing, cyanosis, or edema.  Neurologically, she is a      GCS of 15, awake and alert.            RADIOGRAPHIC EVALUATION:  She had a CT scan of the abdomen and pelvis, and      there is concern for a possible appendicitis.  She has a nonobstructing      right renal stone, and she also has what appeared to be umbilical hernia      that you can see when you look at the CT scan having done the physical      exam.            LABORATORY EVALUATION:  She has a white count of 11, hemoglobin of 13.5,      hematocrit of 40, and platelets of 331.  Electrolytes are within normal      limits.  Her LFTs are normal except for a mildly elevated ALT at 39.  Her      urine is clear.            ASSESSMENT:  A 40 year old with longstanding abdominal pain worsened over      the last month.  Umbilical hernia.            PLAN:  I do not believe that this patient has any indication that she has      an acute appendicitis.  I think her physical exam is not consistent with an      acute appendicitis regardless of what the CT thinks.  Patient certainly has      an umbilical hernia, and that goes along with her history of the fact that      her pain worsens every time she lifts heavy objects.  We explained to her      that we would be more than happy to fix this in an elective manner;      however, we will need to know more  information with regard to her cardiac      problem that she has.  We discussed with her perhaps getting the records      from the doctor in [place] if she can remember who it was, although      currently she does not remember who it was, but we should work to get her      in the system so we can get her hernia fixed.  Electronic Signing MD: Darci Current, MD  ([id number])            D: 03/26/2007 by Darci Current, MD      T: 03/27/2007 by [id number] (W:[id number]) (Dorris Carnes: [id number])      cc:  Darci Current, MD

## 2012-01-28 DIAGNOSIS — I639 Cerebral infarction, unspecified: Secondary | ICD-10-CM

## 2012-01-28 HISTORY — PX: HERNIA REPAIR: SHX51

## 2012-01-28 HISTORY — DX: Cerebral infarction, unspecified: I63.9

## 2012-03-27 ENCOUNTER — Emergency Department: Payer: Self-pay

## 2012-03-27 ENCOUNTER — Emergency Department
Admission: EM | Admit: 2012-03-27 | Discharge: 2012-03-27 | Disposition: A | Discharge status: Home / Self Care | Payer: Self-pay | Attending: Emergency Medicine | Admitting: Emergency Medicine

## 2012-03-27 DIAGNOSIS — Z8669 Personal history of other diseases of the nervous system and sense organs: Secondary | ICD-10-CM | POA: Insufficient documentation

## 2012-03-27 DIAGNOSIS — R51 Headache: Secondary | ICD-10-CM | POA: Insufficient documentation

## 2012-03-27 HISTORY — DX: Endocarditis, valve unspecified: I38

## 2012-03-27 LAB — CBC AND DIFFERENTIAL
Basophils Absolute Automated: 0.05 10*3/uL (ref 0.00–0.20)
Basophils Automated: 1 %
Eosinophils Absolute Automated: 0.34 10*3/uL (ref 0.00–0.70)
Eosinophils Automated: 4 %
Hematocrit: 41.1 % (ref 37.0–47.0)
Hgb: 13.8 g/dL (ref 12.0–16.0)
Immature Granulocytes Absolute: 0.05 10*3/uL
Immature Granulocytes: 1 %
Lymphocytes Absolute Automated: 2.09 10*3/uL (ref 0.50–4.40)
Lymphocytes Automated: 24 %
MCH: 29.7 pg (ref 28.0–32.0)
MCHC: 33.6 g/dL (ref 32.0–36.0)
MCV: 88.4 fL (ref 80.0–100.0)
MPV: 9.1 fL — ABNORMAL LOW (ref 9.4–12.3)
Monocytes Absolute Automated: 0.74 10*3/uL (ref 0.00–1.20)
Monocytes: 8 %
Neutrophils Absolute: 5.65 10*3/uL (ref 1.80–8.10)
Neutrophils: 64 %
Nucleated RBC: 0 /100 WBC (ref 0–1)
Platelets: 348 10*3/uL (ref 140–400)
RBC: 4.65 10*6/uL (ref 4.20–5.40)
RDW: 12 % (ref 12–15)
WBC: 8.87 10*3/uL (ref 3.50–10.80)

## 2012-03-27 LAB — COMPREHENSIVE METABOLIC PANEL
ALT: 111 U/L — ABNORMAL HIGH (ref 0–55)
AST (SGOT): 77 U/L — ABNORMAL HIGH (ref 5–34)
Albumin/Globulin Ratio: 1.2 (ref 0.9–2.2)
Albumin: 3.8 g/dL (ref 3.5–5.0)
Alkaline Phosphatase: 129 U/L (ref 40–150)
Anion Gap: 10 (ref 5.0–15.0)
BUN: 8 mg/dL (ref 7.0–19.0)
Bilirubin, Total: 0.5 mg/dL (ref 0.2–1.2)
CO2: 27 mEq/L (ref 22–29)
Calcium: 10 mg/dL (ref 8.5–10.5)
Chloride: 103 mEq/L (ref 98–107)
Creatinine: 0.7 mg/dL (ref 0.6–1.0)
Globulin: 3.3 g/dL (ref 2.0–3.6)
Glucose: 98 mg/dL (ref 70–100)
Potassium: 3.8 mEq/L (ref 3.5–5.1)
Protein, Total: 7.1 g/dL (ref 6.0–8.3)
Sodium: 140 mEq/L (ref 136–145)

## 2012-03-27 LAB — GFR: EGFR: >60

## 2012-03-27 LAB — POCT URINALYSIS AUTOMATED (IAH)
Bilirubin, UA POCT: NEGATIVE
Blood, UA POCT: NEGATIVE
Glucose, UA POCT: NEGATIVE
Ketones, UA POCT: NEGATIVE mg/dL
Nitrite, UA POCT: NEGATIVE
PH, UA POCT: 7 (ref 4.6–8)
Protein, UA POCT: NEGATIVE mg/dL
Specific Gravity, UA POCT: 1.02 mg/dL (ref 1.001–1.035)
Urine Leukocytes POCT: NEGATIVE
Urobilinogen, UA POCT: 1 mg/dL

## 2012-03-27 LAB — LIPASE: Lipase: 18 U/L (ref 8–78)

## 2012-03-27 LAB — POCT PREGNANCY TEST, URINE HCG: POCT Pregnancy HCG Test, UR: NEGATIVE

## 2012-03-27 LAB — HEMOLYSIS INDEX: Hemolysis Index: 18 Index (ref 0–18)

## 2012-03-27 MED ORDER — OXYCODONE-ACETAMINOPHEN 5-325 MG PO TABS
ORAL_TABLET | ORAL | Status: AC
Start: 2012-03-27 — End: 2012-04-06

## 2012-03-27 MED ORDER — OXYMETAZOLINE HCL 0.05 % NA SOLN
2.0000 | Freq: Two times a day (BID) | NASAL | Status: AC
Start: 2012-03-27 — End: 2012-03-30

## 2012-03-27 MED ORDER — SODIUM CHLORIDE 0.9 % IV BOLUS
1000.00 mL | Freq: Once | INTRAVENOUS | Status: AC
Start: 2012-03-27 — End: 2012-03-27
  Administered 2012-03-27: 1000 mL via INTRAVENOUS

## 2012-03-27 MED ORDER — KETOROLAC TROMETHAMINE 30 MG/ML IJ SOLN
30.0000 mg | Freq: Once | INTRAMUSCULAR | Status: AC
Start: 2012-03-27 — End: 2012-03-27
  Administered 2012-03-27: 30 mg via INTRAVENOUS
  Filled 2012-03-27: qty 1

## 2012-03-27 MED ORDER — OXYCODONE-ACETAMINOPHEN 5-325 MG PO TABS
1.00 | ORAL_TABLET | Freq: Once | ORAL | Status: AC
Start: 2012-03-27 — End: 2012-03-27
  Administered 2012-03-27: 1 via ORAL
  Filled 2012-03-27: qty 1

## 2012-03-27 MED ORDER — FAMOTIDINE 20 MG PO TABS
20.00 mg | ORAL_TABLET | Freq: Two times a day (BID) | ORAL | Status: AC | PRN
Start: 2012-03-27 — End: 2012-04-01

## 2012-03-27 MED ORDER — METOCLOPRAMIDE HCL 5 MG/ML IJ SOLN
10.00 mg | Freq: Once | INTRAMUSCULAR | Status: AC
Start: 2012-03-27 — End: 2012-03-27
  Administered 2012-03-27: 10 mg via INTRAVENOUS
  Filled 2012-03-27: qty 2

## 2012-03-27 MED ORDER — MECLIZINE HCL 12.5 MG PO TABS
50.0000 mg | ORAL_TABLET | Freq: Once | ORAL | Status: AC
Start: 2012-03-27 — End: 2012-03-27
  Administered 2012-03-27: 50 mg via ORAL
  Filled 2012-03-27: qty 4

## 2012-03-27 MED ORDER — ONDANSETRON 4 MG PO TBDP
4.0000 mg | ORAL_TABLET | Freq: Four times a day (QID) | ORAL | Status: AC | PRN
Start: 2012-03-27 — End: 2012-04-03

## 2012-03-27 MED ORDER — ONDANSETRON HCL 4 MG/2ML IJ SOLN
4.00 mg | Freq: Once | INTRAMUSCULAR | Status: DC
Start: 2012-03-27 — End: 2012-03-27

## 2012-03-27 MED ORDER — FAMOTIDINE 10 MG/ML IV SOLN
20.0000 mg | Freq: Once | INTRAVENOUS | Status: AC
Start: 2012-03-27 — End: 2012-03-27
  Administered 2012-03-27: 20 mg via INTRAVENOUS
  Filled 2012-03-27: qty 2

## 2012-03-27 MED ORDER — MAGNESIUM SULFATE IN D5W 10-5 MG/ML-% IV SOLN
1.0000 g | Freq: Once | INTRAVENOUS | Status: AC
Start: 2012-03-27 — End: 2012-03-27
  Administered 2012-03-27: 1 g via INTRAVENOUS
  Filled 2012-03-27: qty 100

## 2012-03-27 NOTE — ED Notes (Signed)
Patient reports vomiting,dizziness,headache. Recent trip from [place]

## 2012-03-27 NOTE — Discharge Instructions (Signed)
Dolor de cabeza     Headache     1.  Usted ha sido tratado por un dolor de cabeza.   1.  You have been treated for a headache.             2.  Los dolores de cabeza son muy comunes. La mayora del tiempo son benignos (no son dainos). Algunos dolores de cabeza pueden ser muy serios. Parece que su dolor de cabeza es benigno. El mdico piensa que ya se puede ir a la casa sin riesgo.    2.  Headaches are very common. Most of the time they are benign (not harmful). Some headaches can be very serious. Your headache appears to be benign. The doctor feels it is safe for you to go home.             3.  Si contina con dolores de cabeza o si este dolor de cabeza no desaparece al cabo de unos das, deber ser evaluado por su mdico familiar o un neurlogo. Mantenga un "diario de sus dolores de cabeza". Esto puede ayudar a su mdico a conocer la causa de sus dolores de cabeza.    3.  If you continue to have headaches, or if this headache does not resolve over the next few days, you should be evaluated by your regular doctor or a neurologist. Keep a "headache diary." This may help your doctor learn the cause of your headaches.             4.  Tome el medicamento para dolor de cabeza como se indica. Esto es especialmente importante si su mdico le ha dado medicamento diario para evitar dolores de cabeza.    4.  Take your headache medication as directed. This is especially important if your doctor has placed you on a daily medication to prevent headaches.             5.  DEBE BUSCAR ATENCIN MDICA INMEDIATAMENTE, AQU O EN LA SALA DE EMERGENCIAS MS CERCANA, SI SE PRESENTA CUALQUIERA DE LAS SIGUIENTES SITUACIONES:   5.  YOU SHOULD SEEK MEDICAL ATTENTION IMMEDIATELY, EITHER HERE OR AT THE NEAREST EMERGENCY DEPARTMENT, IF ANY OF THE FOLLOWING OCCURS:      * El dolor de cabeza empeora.    * Your headache gets worse.      * Tiene un dolor de cabeza fuerte que se presenta  repentinamente.     * You have a severe headache that occurs suddenly.      * Tiene un dolor de cabeza diferente a su dolor de cabeza normal.     * Your head pain is different from your normal headache.      * Tiene fiebre, especialmente acompaada de rigidez en el cuello.    * You have a fever, especially with a stiff neck.      * Siente entumecimiento, hormigueo, o debilidad en sus brazos o piernas.    * You feel numbness, tingling, or weakness in your arms or legs.      * Se desmaya.    * You pass out.      * Tiene problemas con su visin.    * You have problems with your vision.      * Vomita y tiene problemas para tomar el medicamento o para retenerlo.     * You vomit and have trouble taking medication or keeping it down.

## 2012-03-27 NOTE — ED Provider Notes (Addendum)
EMERGENCY DEPARTMENT HISTORY AND PHYSICAL EXAM    Date: 03/27/2012  Patient Name: Valerie May  Attending Physician:  French Ana, MD  Diagnosis and Treatment Plan       Clinical Impression:   1. Headache        Treatment Plan:   ED Disposition     Discharge Valerie May discharge to home/self care.    Condition at discharge: Stable            History of Presenting Illness     Chief Complaint   Patient presents with   . Dizziness   . Headache   . Emesis       History Provided By: Patient  Chief Complaint: HA  Onset: x 2 days PTA  Timing: constant  Location: diffuse  Quality: unable to describe  Severity: moderate  Modifying Factors: none   Associated sxs: abd pain, n/v, dizziness    Additional History: Valerie May is a 42 y.o. female w/ hx of migraines presenting with a diffuse HA ongoing x 2 days with associated n/v, lightheadedness and abd pain. She states that her HA started last week while she was [place] and worsened while on her flight to the [place]. She states that she was seen immediately after the flight at [facility] where she had a negative CT and was dx with sinusitis and given prescription for tramadol and augmentin. She notes that as of today she's been experiencing persistent symptoms, malaise accompanied with abd cramping. Denies fever.    PCP: No primary provider on file.      No current facility-administered medications for this encounter.  Current outpatient prescriptions:amoxicillin-clavulanate (AUGMENTIN) 500-125 MG per tablet, Take 1 tablet by mouth 3 (three) times daily., Disp: , Rfl: ;  traMADol (ULTRAM) 50 MG tablet, Take 50 mg by mouth every 6 (six) hours as needed., Disp: , Rfl: ;  famotidine (PEPCID) 20 MG tablet, Take 1 tablet (20 mg total) by mouth 2 (two) times daily as needed for Heartburn., Disp: 30 tablet, Rfl: 0  ondansetron (ZOFRAN ODT) 4 MG disintegrating tablet, Take 1 tablet (4 mg total) by mouth every 6 (six) hours as needed for Nausea., Disp: 15  tablet, Rfl: 0;  oxyCODONE-acetaminophen (PERCOCET) 5-325 MG per tablet, 1-2 tablets by mouth every 4-6 hours as needed for pain;  Do not drive or operate machinery while taking this medicine, Disp: 20 tablet, Rfl: 0  oxymetazoline (AFRIN) 0.05 % nasal spray, 2 sprays by Nasal route 2 (two) times daily., Disp: 30 mL, Rfl: 0    Past Medical History     Past Medical History   Diagnosis Date   . Heart valve problem      History reviewed. No pertinent past surgical history.    Family History     No family history on file.    Social History     History     Social History   . Marital Status: Single     Spouse Name: N/A     Number of Children: N/A   . Years of Education: N/A     Social History Main Topics   . Smoking status: Not on file   . Smokeless tobacco: Not on file   . Alcohol Use:    . Drug Use:    . Sexually Active: Not on file     Other Topics Concern   . Not on file     Social History Narrative   .  No narrative on file       Allergies     No Known Allergies    Review of Systems     Review of Systems   Constitutional: Negative for fever and chills.   HENT: Positive for congestion and sinus pressure. Negative for sore throat, rhinorrhea, neck pain and neck stiffness.    Eyes: Negative for photophobia, pain and visual disturbance.   Respiratory: Negative for cough and shortness of breath.    Cardiovascular: Negative for chest pain and palpitations.   Gastrointestinal: Positive for nausea, vomiting and abdominal pain. Negative for diarrhea, constipation and blood in stool.   Genitourinary: Negative for dysuria and hematuria.   Musculoskeletal: Negative for myalgias and arthralgias.   Skin: Negative for color change and rash.   Neurological: Positive for dizziness, light-headedness and headaches. Negative for seizures, syncope, speech difficulty, weakness and numbness.   Psychiatric/Behavioral: Negative.          Physical Exam     BP 133/82  Pulse 72  Temp 97.8 F (36.6 C)  Resp 18  SpO2 99%  LMP  03/18/2012  Pulse Oximetry Analysis - Normal at 99 on RA    Physical Exam   Nursing note and vitals reviewed.  Constitutional: She is oriented to person, place, and time and well-developed, well-nourished, and in no distress. No distress.   HENT:   Head: Normocephalic and atraumatic.   Mouth/Throat: Oropharynx is clear and moist. No oropharyngeal exudate.        Frontal sinus tenderness to palpation   Eyes: EOM are normal. Pupils are equal, round, and reactive to light.        No nystagmus   Neck: Normal range of motion. Neck supple.        No neck stiffness or signs of meningismus, full ROM   Cardiovascular: Normal rate, regular rhythm, normal heart sounds and intact distal pulses.    Pulmonary/Chest: Effort normal and breath sounds normal. No respiratory distress. She has no wheezes. She has no rales.   Abdominal: Soft. Bowel sounds are normal. She exhibits no distension. There is no tenderness.        No abdominal pain to palpation  No CVA tenderness   Musculoskeletal: Normal range of motion. She exhibits no edema and no tenderness.   Neurological: She is alert and oriented to person, place, and time. No cranial nerve deficit. Gait normal. Coordination normal. GCS score is 15.        Negative ROMBERG with no drift, steady gait to room and to bathroom. Normal FTN and HTS exam.    Skin: Skin is warm and dry. She is not diaphoretic.   Psychiatric: Mood, memory, affect and judgment normal.             Diagnostic Study Results     Labs -     Results     Procedure Component Value Units Date/Time    Comprehensive metabolic panel [[id number]]  (Abnormal) Collected:03/27/12 2134    Specimen Information:Blood Updated:03/27/12 2157     Glucose 98 mg/dL      BUN 8.0 mg/dL      Creatinine 0.7 mg/dL      Sodium 865 mEq/L      Potassium 3.8 mEq/L      Chloride 103 mEq/L      CO2 27 mEq/L      Calcium 10.0 mg/dL      Protein, Total 7.1 g/dL      Albumin 3.8 g/dL  AST (SGOT) 77 (H) U/L      ALT 111 (H) U/L      Alkaline  Phosphatase 129 U/L      Bilirubin, Total 0.5 mg/dL      Globulin 3.3 g/dL      Albumin/Globulin Ratio 1.2      Anion Gap 10.0     Lipase [[id number]] Collected:03/27/12 2134    Specimen Information:Blood Updated:03/27/12 2157     Lipase 18 U/L     HEMOLYZED INDEX [[id number]] Collected:03/27/12 2134     Hemolyzed Index 18 Index Updated:03/27/12 2157    GFR [[id number]] Collected:03/27/12 2134     EGFR >60.0   Updated:03/27/12 2157    CBC with differential [[id number]]  (Abnormal) Collected:03/27/12 2134    Specimen Information:Blood / Blood Updated:03/27/12 2148     WBC 8.87 x10 3/uL      RBC 4.65 x10 6/uL      Hgb 13.8 g/dL      Hematocrit 10.2 %      MCV 88.4 fL      MCH 29.7 pg      MCHC 33.6 g/dL      RDW 12 %      Platelets 348 x10 3/uL      MPV 9.1 (L) fL      Neutrophils 64 %      Lymphocytes Automated 24 %      Monocytes 8 %      Eosinophils Automated 4 %      Basophils Automated 1 %      Immature Granulocyte 1 %      Nucleated RBC 0 /100 WBC      Neutrophils Absolute 5.65 x10 3/uL      Abs Lymph Automated 2.09 x10 3/uL      Abs Mono Automated 0.74 x10 3/uL      Abs Eos Automated 0.34 x10 3/uL      Absolute Baso Automated 0.05 x10 3/uL      Absolute Immature Granulocyte 0.05 x10 3/uL     Urine HCG POC [[id number]] Collected:03/27/12 2122     POCT QC Pass Updated:03/27/12 2122     POCT Pregnancy HCG Test, UR Negative      Comment:        Result:     Negative Value is Normal in Healthy Males or Healthy non-pregnant Females    UA POC [[id number]] Collected:03/27/12 2121     Color UA POCT Yellow Updated:03/27/12 2122     Clarity UA POCT Clear      Glucose, UA POCT Negative      Bilirubin, UA POCT Negative      Ketones, UA POCT Negative mg/dL      Specific Gravity, UA POCT 1.020 mg/dL      Blood, UA POCT  Negative      PH, UA POCT 7.0      Protein, UA POCT Negative mg/dL      Urobilinogen, UA POCT 1.0 mg/dL      Nitrite, UA POCT Negative      Leukocytes, UA POCT Negative           Radiologic Studies -   Radiology Results  (24 Hour)     ** No Results found for the last 24 hours. **      .    Doctor's Notes   10:20 PM - patient's pain 5/10 and dizziness has improved, no abdominal pain on re-examination  10:47 PM - patient with steady gait and nml ambulation on the  way to the bathroom, pain 3/10  10:50 PM - tolerating PO and agrees with plan to d/c home    Pt presents with history of migraine headaches, current symptoms possibly complex migraine vs sinus headache accounting for general malaise. Pt had no focal neuro deficits, steady gait and no nystagmus to suggest cerebellar CVA pathology. Pt also abdominal cramping but no obvious focal tenderness on repeated exams, no RUQ tenderness. Symptoms mostly resolved after treatment and pt was tolerating PO intake. Instructed to continue with medications prescribed at [facility], pt stable for discharge.  Pt referred to Dr. Russella Dar for her frequent headaches.       _______________________________  Medical DeMedical Decision Makingcision Making  Attestations:     Physician/Midlevel provider first contact with patient: 03/27/12 2121         This note is prepared by Hedda Slade, acting as Scribe for French Ana, MD    French Ana, MD The scribe's documentation has been prepared under my direction and personally reviewed by me in its entirety.  I confirm that the note above accurately reflects all work, treatment, procedures, and medical decision making performed by me.     I am the first provider for this patient.    I reviewed the vital signs, available nursing notes, past medical history, past surgical history, family history and social history.    _______________________________              French Ana, MD  03/29/12 Leanord Hawking    French Ana, MD  03/29/12 [phone]

## 2013-01-27 HISTORY — PX: CYST REMOVAL: SHX22

## 2016-02-19 DIAGNOSIS — H52203 Unspecified astigmatism, bilateral: Secondary | ICD-10-CM | POA: Insufficient documentation

## 2016-02-19 DIAGNOSIS — K219 Gastro-esophageal reflux disease without esophagitis: Secondary | ICD-10-CM | POA: Insufficient documentation

## 2016-10-04 DIAGNOSIS — G4733 Obstructive sleep apnea (adult) (pediatric): Secondary | ICD-10-CM | POA: Insufficient documentation

## 2017-04-22 DIAGNOSIS — M47816 Spondylosis without myelopathy or radiculopathy, lumbar region: Secondary | ICD-10-CM | POA: Insufficient documentation

## 2017-05-02 DIAGNOSIS — G43719 Chronic migraine without aura, intractable, without status migrainosus: Secondary | ICD-10-CM | POA: Insufficient documentation

## 2017-10-11 DIAGNOSIS — D259 Leiomyoma of uterus, unspecified: Secondary | ICD-10-CM | POA: Insufficient documentation

## 2017-10-11 HISTORY — DX: Leiomyoma of uterus, unspecified: D25.9

## 2018-01-27 HISTORY — PX: HYSTERECTOMY: SHX81

## 2019-01-28 DIAGNOSIS — U071 COVID-19: Secondary | ICD-10-CM

## 2019-01-28 HISTORY — DX: COVID-19: U07.1

## 2019-12-07 ENCOUNTER — Emergency Department: Payer: Medicaid HMO

## 2019-12-07 ENCOUNTER — Emergency Department
Admission: EM | Admit: 2019-12-07 | Discharge: 2019-12-07 | Discharge status: Against Medical Advice | Payer: Medicaid HMO | Attending: Emergency Medicine | Admitting: Emergency Medicine

## 2019-12-07 DIAGNOSIS — Z5321 Procedure and treatment not carried out due to patient leaving prior to being seen by health care provider: Secondary | ICD-10-CM | POA: Insufficient documentation

## 2019-12-07 DIAGNOSIS — R519 Headache, unspecified: Secondary | ICD-10-CM | POA: Insufficient documentation

## 2019-12-07 MED ORDER — KETOROLAC TROMETHAMINE 30 MG/ML IJ SOLN
15.0000 mg | Freq: Once | INTRAMUSCULAR | Status: DC
Start: 2019-12-07 — End: 2019-12-07

## 2019-12-07 MED ORDER — METOCLOPRAMIDE HCL 5 MG/ML IJ SOLN
10.0000 mg | Freq: Once | INTRAMUSCULAR | Status: AC
Start: 2019-12-07 — End: 2019-12-07
  Administered 2019-12-07: 04:00:00 10 mg via INTRAVENOUS
  Filled 2019-12-07: qty 2

## 2019-12-07 MED ORDER — DIPHENHYDRAMINE HCL 50 MG/ML IJ SOLN
25.0000 mg | Freq: Once | INTRAMUSCULAR | Status: AC
Start: 2019-12-07 — End: 2019-12-07
  Administered 2019-12-07: 04:00:00 25 mg via INTRAVENOUS
  Filled 2019-12-07: qty 1

## 2019-12-07 MED ORDER — SODIUM CHLORIDE 0.9 % IV BOLUS
1000.0000 mL | Freq: Once | INTRAVENOUS | Status: AC
Start: 2019-12-07 — End: 2019-12-07
  Administered 2019-12-07: 04:00:00 1000 mL via INTRAVENOUS

## 2019-12-07 NOTE — ED Provider Notes (Signed)
I was not involved in the care of this patient     Darrol Angel Elna Breslow, MD  12/07/19 [phone]

## 2019-12-07 NOTE — ED Notes (Signed)
Pt seen walking out of ER.  Presumed to have LWBS.

## 2019-12-07 NOTE — ED Notes (Signed)
PT took off IV while medication and fluids were running.

## 2020-03-24 DIAGNOSIS — R0600 Dyspnea, unspecified: Secondary | ICD-10-CM | POA: Insufficient documentation

## 2020-03-25 DIAGNOSIS — R0902 Hypoxemia: Secondary | ICD-10-CM | POA: Insufficient documentation

## 2020-08-12 ENCOUNTER — Emergency Department: Admit: 2020-08-12 | Discharge: 2020-09-28 | Payer: PRIVATE HEALTH INSURANCE

## 2020-08-12 ENCOUNTER — Inpatient Hospital Stay
Admit: 2020-08-12 | Discharge: 2020-08-13 | Disposition: A | Discharge status: Home / Self Care | Payer: PRIVATE HEALTH INSURANCE

## 2020-08-12 DIAGNOSIS — K529 Noninfective gastroenteritis and colitis, unspecified: Secondary | ICD-10-CM

## 2020-08-12 LAB — COMPREHENSIVE METABOLIC PANEL W/ REFLEX TO MG FOR LOW K
ALT: 20 U/L (ref 5–33)
AST: 26 U/L (ref 5–32)
Albumin: 4.7 g/dL (ref 3.5–5.2)
Alkaline Phosphatase: 96 U/L (ref 35–104)
Anion Gap: 13 mmol/L (ref 7–19)
BUN: 13 mg/dL (ref 6–20)
CO2: 29 mmol/L (ref 22–29)
Calcium: 9.9 mg/dL (ref 8.6–10.0)
Chloride: 98 mmol/L (ref 98–111)
Creatinine: 0.5 mg/dL (ref 0.5–0.9)
GFR African American: >59 (ref >59)
GFR Non-African American: >60 (ref >60)
Glucose: 147 mg/dL — ABNORMAL HIGH (ref 74–109)
Potassium reflex Magnesium: 3.1 mmol/L — ABNORMAL LOW (ref 3.5–5.0)
Sodium: 140 mmol/L (ref 136–145)
Total Bilirubin: 0.5 mg/dL (ref 0.2–1.2)
Total Protein: 7.5 g/dL (ref 6.6–8.7)

## 2020-08-12 LAB — MICROSCOPIC URINALYSIS
Bacteria, UA: NEGATIVE /HPF — AB
Crystals, UA: NEGATIVE /HPF — AB
Epithelial Cells, UA: 6 /HPF (ref 0–5)
Hyaline Casts, UA: 22 /HPF — ABNORMAL HIGH (ref 0–8)
RBC, UA: 3 /HPF (ref 0–4)
WBC, UA: 10 /HPF — ABNORMAL HIGH (ref 0–5)

## 2020-08-12 LAB — URINALYSIS WITH REFLEX TO CULTURE
Blood, Urine: NEGATIVE
Glucose, Ur: NEGATIVE mg/dL
Nitrite, Urine: POSITIVE — AB
Protein, UA: 100 mg/dL — AB
Specific Gravity, UA: 1.03 (ref 1.005–1.030)
Urobilinogen, Urine: 1 E.U./dL (ref <2.0)
pH, UA: 5.5 (ref 5.0–8.0)

## 2020-08-12 LAB — CBC WITH AUTO DIFFERENTIAL
Basophils %: 0.3 % (ref 0.0–1.0)
Basophils Absolute: 0.1 10*3/uL (ref 0.00–0.20)
Eosinophils %: 0.4 % (ref 0.0–5.0)
Eosinophils Absolute: 0.1 10*3/uL (ref 0.00–0.60)
Hematocrit: 50.8 % — ABNORMAL HIGH (ref 37.0–47.0)
Hemoglobin: 16.8 g/dL — ABNORMAL HIGH (ref 12.0–16.0)
Immature Granulocytes #: 0.1 10*3/uL
Lymphocytes %: 8.4 % — ABNORMAL LOW (ref 20.0–40.0)
Lymphocytes Absolute: 1.4 10*3/uL (ref 1.1–4.5)
MCH: 29.5 pg (ref 27.0–31.0)
MCHC: 33.1 g/dL (ref 33.0–37.0)
MCV: 89.1 fL (ref 81.0–99.0)
MPV: 8.9 fL — ABNORMAL LOW (ref 9.4–12.3)
Monocytes %: 4.2 % (ref 0.0–10.0)
Monocytes Absolute: 0.7 10*3/uL (ref 0.00–0.90)
Neutrophils %: 86.2 % — ABNORMAL HIGH (ref 50.0–65.0)
Neutrophils Absolute: 14.1 10*3/uL — ABNORMAL HIGH (ref 1.5–7.5)
Platelets: 402 10*3/uL — ABNORMAL HIGH (ref 130–400)
RBC: 5.7 M/uL — ABNORMAL HIGH (ref 4.20–5.40)
RDW: 12 % (ref 11.5–14.5)
WBC: 16.4 10*3/uL — ABNORMAL HIGH (ref 4.8–10.8)

## 2020-08-12 LAB — BLOOD GAS, ARTERIAL
Allen Test: POSITIVE
Base Excess, Arterial: 3.5 mmol/L — ABNORMAL HIGH (ref <=2.0)
Carboxyhgb, Arterial: 2.1 % (ref 0.0–5.0)
HCO3, Arterial: 30.9 mmol/L — ABNORMAL HIGH (ref 22.0–26.0)
Hemoglobin, Art, Extended: 16.5 g/dL — ABNORMAL HIGH (ref 12.0–16.0)
Methemoglobin, Arterial: 1.3 % (ref <1.5)
O2 Content, Arterial: 21.9 mL/dL
O2 Sat, Arterial: 94.5 % (ref >92)
Oxygen Flow: 1
Potassium, Whole Blood: 3.2
pCO2, Arterial: 56 mmHg — ABNORMAL HIGH (ref 35.0–45.0)
pH, Arterial: 7.35 (ref 7.350–7.450)
pO2, Arterial: 86 mmHg (ref 80.0–100.0)

## 2020-08-12 LAB — LACTATE, SEPSIS: Lactic Acid, Sepsis: 1.7 mg/dL (ref 0.5–1.9)

## 2020-08-12 LAB — D-DIMER, QUANTITATIVE: D-Dimer, Quant: 1.18 ug/mL FEU — ABNORMAL HIGH (ref 0.00–0.48)

## 2020-08-12 LAB — BRAIN NATRIURETIC PEPTIDE: Pro-BNP: 24 pg/mL (ref 0–450)

## 2020-08-12 LAB — COVID-19, RAPID: SARS-CoV-2, NAAT: NOT DETECTED

## 2020-08-12 LAB — LIPASE: Lipase: 17 U/L (ref 13–60)

## 2020-08-12 LAB — MAGNESIUM: Magnesium: 2.3 mg/dL (ref 1.6–2.6)

## 2020-08-12 LAB — TROPONIN: Troponin: <0.01 ng/mL (ref 0.00–0.03)

## 2020-08-12 MED ORDER — MORPHINE SULFATE (PF) 4 MG/ML IJ SOLN
4 MG/ML | Freq: Once | INTRAMUSCULAR | Status: AC
Start: 2020-08-12 — End: 2020-08-12
  Administered 2020-08-12: 18:00:00 4 mg via INTRAVENOUS

## 2020-08-12 MED ORDER — ONDANSETRON HCL 4 MG/2ML IJ SOLN
4 MG/2ML | Freq: Once | INTRAMUSCULAR | Status: AC
Start: 2020-08-12 — End: 2020-08-12
  Administered 2020-08-12: 17:00:00 4 mg via INTRAVENOUS

## 2020-08-12 MED ORDER — METOCLOPRAMIDE HCL 10 MG PO TABS
10 MG | ORAL_TABLET | Freq: Four times a day (QID) | ORAL | 3 refills | Status: AC
Start: 2020-08-12 — End: ?

## 2020-08-12 MED ORDER — MORPHINE SULFATE (PF) 4 MG/ML IJ SOLN
4 MG/ML | Freq: Once | INTRAMUSCULAR | Status: AC
Start: 2020-08-12 — End: 2020-08-12
  Administered 2020-08-12: 17:00:00 4 mg via INTRAVENOUS

## 2020-08-12 MED ORDER — METRONIDAZOLE 500 MG PO TABS
500 MG | ORAL_TABLET | Freq: Three times a day (TID) | ORAL | 0 refills | Status: AC
Start: 2020-08-12 — End: 2020-08-22

## 2020-08-12 MED ORDER — METOCLOPRAMIDE HCL 5 MG/ML IJ SOLN
5 MG/ML | Freq: Once | INTRAMUSCULAR | Status: AC
Start: 2020-08-12 — End: 2020-08-12
  Administered 2020-08-12: 18:00:00 10 mg via INTRAVENOUS

## 2020-08-12 MED ORDER — CIPROFLOXACIN HCL 500 MG PO TABS
500 MG | ORAL_TABLET | Freq: Two times a day (BID) | ORAL | 0 refills | Status: AC
Start: 2020-08-12 — End: 2020-08-22

## 2020-08-12 MED ORDER — SODIUM CHLORIDE 0.9 % IV BOLUS
0.9 % | Freq: Once | INTRAVENOUS | Status: AC
Start: 2020-08-12 — End: 2020-08-12
  Administered 2020-08-12: 17:00:00 500 mL via INTRAVENOUS

## 2020-08-12 MED ORDER — METRONIDAZOLE 500 MG/100ML IV SOLN
500 MG/100ML | Freq: Once | INTRAVENOUS | Status: AC
Start: 2020-08-12 — End: 2020-08-12
  Administered 2020-08-12: 23:00:00 500 mg via INTRAVENOUS

## 2020-08-12 MED ORDER — IOPAMIDOL 76 % IV SOLN
76 % | Freq: Once | INTRAVENOUS | Status: AC | PRN
Start: 2020-08-12 — End: 2020-08-12
  Administered 2020-08-12: 19:00:00 70 mL via INTRAVENOUS

## 2020-08-12 MED ORDER — CIPROFLOXACIN IN D5W 400 MG/200ML IV SOLN
400200 MG/200ML | Freq: Once | INTRAVENOUS | Status: AC
Start: 2020-08-12 — End: 2020-08-12
  Administered 2020-08-12: 22:00:00 400 mg via INTRAVENOUS

## 2020-08-12 MED FILL — METOCLOPRAMIDE HCL 5 MG/ML IJ SOLN: 5 mg/mL | INTRAMUSCULAR | Qty: 2

## 2020-08-12 MED FILL — ONDANSETRON HCL 4 MG/2ML IJ SOLN: 4 MG/2ML | INTRAMUSCULAR | Qty: 2

## 2020-08-12 MED FILL — METRONIDAZOLE 500 MG/100ML IV SOLN: 500 MG/100ML | INTRAVENOUS | Qty: 100

## 2020-08-12 MED FILL — CIPROFLOXACIN IN D5W 400 MG/200ML IV SOLN: 400 MG/200ML | INTRAVENOUS | Qty: 200

## 2020-08-12 MED FILL — MORPHINE SULFATE 4 MG/ML IJ SOLN: 4 mg/mL | INTRAMUSCULAR | Qty: 1

## 2020-08-12 NOTE — ED Triage Notes (Signed)
Pt with multiple complaints at this time. Chief complaint is abdominal pain with bilateral lower flank pain. Pt also c/o dysuria and constipation. Pt with O2 sat 88% on RA. Pt states after having covid, she occasionally needs supplemental O2 at home and wears 1.5-2L via NC.

## 2020-08-12 NOTE — ED Notes (Signed)
Pt ambulated to restroom with steady gait. Attempting to have BM per pt.      Darrall Dears, RN  08/12/20 1750

## 2020-08-12 NOTE — ED Provider Notes (Signed)
[facility] EMERGENCY DEPT  eMERGENCYdEPARTMENT eNCOUnter      Pt Name: Anne Richard  MRN: [id number]  Birthdate 12-31-1970  Date of evaluation: 08/12/2020  Provider:Earley Grobe, PA    CHIEF COMPLAINT       Chief Complaint   Patient presents with    Abdominal Pain     Pt c/o generalized abdominal pain and bilateral flank pain starting this morning around 0500         HISTORY OF PRESENT ILLNESS  (Location/Symptom, Timing/Onset, Context/Setting, Quality, Duration, Modifying Factors, Severity.)   Anne Richard is a 50 y.o. female who presents to the emergency department with complaints of generalized abdominal pain now involving bilateral flank started acute this AM 0500 denies hematuria or discharge. No fever or chills. This is not pleuritic. 10/10 pain scale. She hurts all over covid swab pending has hx of kidney stones that have passed in the past. Had covid in December last year has had to wear 2 L nasal cannula as needed. She is hypoxic today when she takes off her oxygen.     HPI    Nursing Notes were reviewed and I agree.    REVIEW OF SYSTEMS    (2-9 systems for level 4, 10 or more for level 5)     Review of Systems   Constitutional:  Negative for activity change, appetite change, chills and fever.   HENT:  Negative for congestion, postnasal drip, rhinorrhea and sore throat.    Eyes:  Negative for photophobia, pain, discharge and visual disturbance.   Respiratory:  Positive for shortness of breath. Negative for apnea and cough.    Cardiovascular:  Negative for chest pain and leg swelling.   Gastrointestinal:  Positive for abdominal pain. Negative for abdominal distention and nausea.   Genitourinary:  Positive for flank pain. Negative for vaginal bleeding.   Musculoskeletal:  Negative for arthralgias, back pain, joint swelling, neck pain and neck stiffness.   Skin:  Negative for color change and rash.   Neurological:  Negative for dizziness, syncope, facial asymmetry and headaches.   Hematological:  Negative for adenopathy.  Does not bruise/bleed easily.   Psychiatric/Behavioral:  Negative for agitation, behavioral problems and confusion.       Except as noted above the remainder of the review of systems was reviewed and negative.       PAST MEDICAL HISTORY     Past Medical History:   Diagnosis Date    Asthma     Cerebral artery occlusion with cerebral infarction (HCC)     Depression     Migraines          SURGICAL HISTORY       Past Surgical History:   Procedure Laterality Date    CHOLECYSTECTOMY      HERNIA REPAIR      HYSTERECTOMY (CERVIX STATUS UNKNOWN)           CURRENT MEDICATIONS       Discharge Medication List as of 08/12/2020  7:57 PM        CONTINUE these medications which have NOT CHANGED    Details   Fexofenadine HCl (ALLEGRA ALLERGY PO) Take by mouthHistorical Med      aspirin-acetaminophen-caffeine (EXCEDRIN MIGRAINE) 250-250-65 MG per tablet Take 1 tablet by mouth every 6 hours as needed for HeadachesHistorical Med      ibuprofen (ADVIL;MOTRIN) 200 MG tablet Take 200 mg by mouth every 6 hours as needed for PainHistorical Med  ALLERGIES     Compazine [prochlorperazine]    FAMILY HISTORY     History reviewed. No pertinent family history.       SOCIAL HISTORY       Social History     Socioeconomic History    Marital status: Legally Separated     Spouse name: None    Number of children: None    Years of education: None    Highest education level: None   Tobacco Use    Smoking status: Never    Smokeless tobacco: Never   Substance and Sexual Activity    Alcohol use: Never    Drug use: Never       SCREENINGS    Glasgow Coma Scale  Eye Opening: Spontaneous  Best Verbal Response: Oriented  Best Motor Response: Obeys commands  Glasgow Coma Scale Score: 15      PHYSICAL EXAM    (up to 7 forlevel 4, 8 or more for level 5)     ED Triage Vitals [08/12/20 1235]   BP Temp Temp Source Heart Rate Resp SpO2 Height Weight   135/81 98.1 ??F (36.7 ??C) Oral (!) 108 20 92 % 4\' 11"  (1.499 m) 163 lb (73.9 kg)       Physical Exam  Vitals  and nursing note reviewed.   Constitutional:       General: She is in acute distress.      Appearance: She is well-developed. She is not diaphoretic.   HENT:      Head: Normocephalic and atraumatic.      Right Ear: External ear normal.      Left Ear: External ear normal.      Mouth/Throat:      Pharynx: No oropharyngeal exudate.   Eyes:      General:         Right eye: No discharge.         Left eye: No discharge.      Pupils: Pupils are equal, round, and reactive to light.   Neck:      Thyroid: No thyromegaly.   Cardiovascular:      Rate and Rhythm: Normal rate and regular rhythm.      Heart sounds: Normal heart sounds. No murmur heard.    No friction rub.   Pulmonary:      Effort: Pulmonary effort is normal. No respiratory distress.      Breath sounds: Normal breath sounds. No stridor. No wheezing.   Abdominal:      General: Bowel sounds are normal. There is no distension.      Palpations: Abdomen is soft.      Tenderness: There is abdominal tenderness in the periumbilical area. There is right CVA tenderness and left CVA tenderness.   Musculoskeletal:         General: Normal range of motion.      Cervical back: Normal range of motion and neck supple.   Skin:     General: Skin is warm and dry.      Capillary Refill: Capillary refill takes less than 2 seconds.      Findings: No rash.   Neurological:      Mental Status: She is alert and oriented to person, place, and time.      Cranial Nerves: No cranial nerve deficit.      Sensory: No sensory deficit.      Coordination: Coordination normal.   Psychiatric:         Behavior: Behavior normal.  Thought Content: Thought content normal.         DIAGNOSTIC RESULTS     RADIOLOGY:   Non-plain film images such as CT, Ultrasound and MRI are read by the radiologist. Plain radiographic images are visualized and preliminarilyinterpreted by No att. providers found with the below findings:        Interpretation per the Radiologist below, if available at the time of this  note:    CTA PULMONARY W CONTRAST   Final Result   1.  Scattered vague areas of groundglass change.   2.  Bilateral anterior upper lobe atelectasis.   3.  No evidence of pulmonary embolus or aortic dissection.   4.  Other nonacute findings above.   Recommendation: Follow up as clinically indicated.   All CT scans at this facility utilize dose modulation, iterative reconstruction, and/or weight based dosing when appropriate to reduce radiation dose to as low as reasonably achievable.   Electronically Signed by Babette RelicZINN, WILLIAM MD at 12-Aug-2020 05:50:40 PM               XR CHEST PORTABLE   Final Result   No acute pathology.   Recommendation: Follow up as clinically indicated.                Electronically Signed by Babette RelicZINN, WILLIAM MD at 12-Aug-2020 03:02:42 PM               CT KIDNEY WO CONTRAST   Final Result   1. Marked wall thickening of the colon from the distal transverse colon through the distal descending colon with adjacent inflammatory changes.  Suggest inflammatory bowel disease.  Underlying malignancy is not excluded.   2. Diffuse colonic diverticulosis.  Constipation.   3. Bilateral non-obstructing renal calculi measure up to 4 mm on the right.  Questionable hypodense nodule measuring 7 mm within the right kidney with subtle calcifications.       Recommendation: Follow up as clinically indicated.       All CT scans at this facility utilize dose modulation, iterative reconstruction, and/or weight based dosing when appropriate to reduce radiation dose to as low as reasonably achievable.   Electronically Signed by Lucilla LameBROWN, FARAH MD at 12-Aug-2020 02:57:44 PM                   LABS:  Labs Reviewed   CBC WITH AUTO DIFFERENTIAL - Abnormal; Notable for the following components:       Result Value    WBC 16.4 (*)     RBC 5.70 (*)     Hemoglobin 16.8 (*)     Hematocrit 50.8 (*)     Platelets 402 (*)     MPV 8.9 (*)     Neutrophils % 86.2 (*)     Lymphocytes % 8.4 (*)     Neutrophils Absolute 14.1 (*)     All other  components within normal limits   COMPREHENSIVE METABOLIC PANEL W/ REFLEX TO MG FOR LOW K - Abnormal; Notable for the following components:    Potassium reflex Magnesium 3.1 (*)     Glucose 147 (*)     All other components within normal limits   URINALYSIS WITH REFLEX TO CULTURE - Abnormal; Notable for the following components:    Color, UA DARK YELLOW (*)     Clarity, UA CLOUDY (*)     Bilirubin Urine MODERATE (*)     Ketones, Urine TRACE (*)     Protein, UA 100 (*)  Nitrite, Urine POSITIVE (*)     Leukocyte Esterase, Urine SMALL (*)     All other components within normal limits   D-DIMER, QUANTITATIVE - Abnormal; Notable for the following components:    D-Dimer, Quant 1.18 (*)     All other components within normal limits   MICROSCOPIC URINALYSIS - Abnormal; Notable for the following components:    Bacteria, UA NEGATIVE (*)     Crystals, UA NEG (*)     Hyaline Casts, UA 22 (*)     WBC, UA 10 (*)     All other components within normal limits   BLOOD GAS, ARTERIAL - Abnormal; Notable for the following components:    pCO2, Arterial 56.0 (*)     HCO3, Arterial 30.9 (*)     Base Excess, Arterial 3.5 (*)     Hemoglobin, Art, Extended 16.5 (*)     All other components within normal limits   COVID-19, RAPID   CULTURE, URINE    Narrative:     ORDER#: [id number]                          ORDERED BY: Geraldine Solar  SOURCE: Urine Clean Catch                  COLLECTED:  08/12/20 14:02  ANTIBIOTICS AT COLL.:                      RECEIVED :  08/12/20 14:07   CULTURE, BLOOD 2   CULTURE, BLOOD 1   LIPASE   MAGNESIUM   TROPONIN   BRAIN NATRIURETIC PEPTIDE   LACTATE, SEPSIS       All other labs were within normal range or notreturned as of this dictation.    RE-ASSESSMENT          EMERGENCY DEPARTMENT COURSE and DIFFERENTIAL DIAGNOSIS/MDM:   Vitals:    Vitals:    08/12/20 1848 08/12/20 1901 08/12/20 1902 08/12/20 1932   BP: 102/60 (!) 102/56  102/68   Pulse: 97 (!) 103  100   Resp: 22 22  19    Temp:       TempSrc:       SpO2: 91%   92% 93%   Weight:       Height:               MDM  Concerning for colitis about the transverse colon correlating with her symptoms she is made aware of all incidental findings on her CT nothing looks like a stone passing at this time.  Her lungs do not show any concern for PE or consolidation.  ABGs are appropriate for baseline she has a oxygen she can wear at home if this gets worse she will need to return.  I notified my attending of the work-up the goal at this time is to try to have a bowel movement based on fecal impaction we will start some Cipro Flagyl IV for her to go home with something for pain and antibiotic coverage she understands return precautions.    PROCEDURES:    Procedures      FINAL IMPRESSION      1. Colitis          DISPOSITION/PLAN   DISPOSITION Decision To Discharge 08/12/2020 08:18:03 PM      PATIENT REFERRED TO:  No follow-up provider specified.    DISCHARGE MEDICATIONS:  Discharge Medication List as of 08/12/2020  7:57  PM        START taking these medications    Details   metoclopramide (REGLAN) 10 MG tablet Take 1 tablet by mouth in the morning and 1 tablet at noon and 1 tablet in the evening and 1 tablet before bedtime., Disp-120 tablet, R-3Normal      ciprofloxacin (CIPRO) 500 MG tablet Take 1 tablet by mouth in the morning and 1 tablet before bedtime. Do all this for 10 days., Disp-20 tablet, R-0Normal      metroNIDAZOLE (FLAGYL) 500 MG tablet Take 1 tablet by mouth in the morning and 1 tablet at noon and 1 tablet before bedtime. Do all this for 10 days., Disp-30 tablet, R-0Normal             (Please note that portions of this note were completed with a voice recognition program.  Efforts were made to edit the dictations but occasionallywords are mis-transcribed.)    Cristy Friedlander, PA       Cristy Friedlander, [place]  08/13/20 1507

## 2020-08-12 NOTE — ED Notes (Signed)
Pt states she had medium-sized BM that was brown, partially formed and partially loose/diarrhea.     Darrall Dears, RN  08/12/20 1818

## 2020-08-14 LAB — CULTURE, URINE: Urine Culture, Routine: <50000

## 2020-08-14 LAB — EKG 12-LEAD
P Axis: 51 degrees
P-R Interval: 130 ms
Q-T Interval: 370 ms
QRS Duration: 106 ms
QTc Calculation (Bazett): 450 ms
T Axis: 18 degrees

## 2020-08-17 LAB — CULTURE, BLOOD 1: Blood Culture, Routine: NO GROWTH

## 2020-08-17 LAB — CULTURE, BLOOD 2: Culture, Blood 2: NO GROWTH

## 2020-11-07 ENCOUNTER — Ambulatory Visit (INDEPENDENT_AMBULATORY_CARE_PROVIDER_SITE_OTHER): Payer: No Typology Code available for payment source | Admitting: Family Medicine

## 2020-11-07 ENCOUNTER — Other Ambulatory Visit (INDEPENDENT_AMBULATORY_CARE_PROVIDER_SITE_OTHER): Payer: Self-pay | Admitting: Family Medicine

## 2020-11-07 ENCOUNTER — Other Ambulatory Visit (FREE_STANDING_LABORATORY_FACILITY): Payer: No Typology Code available for payment source

## 2020-11-07 DIAGNOSIS — Z0000 Encounter for general adult medical examination without abnormal findings: Secondary | ICD-10-CM

## 2020-11-07 LAB — COMPREHENSIVE METABOLIC PANEL
ALT: 19 U/L (ref 0–55)
AST (SGOT): 17 U/L (ref 5–41)
Albumin/Globulin Ratio: 1.4 (ref 0.9–2.2)
Albumin: 4.3 g/dL (ref 3.5–5.0)
Alkaline Phosphatase: 111 U/L (ref 37–117)
Anion Gap: 8 (ref 5.0–15.0)
BUN: 10 mg/dL (ref 7.0–21.0)
Bilirubin, Total: 1.2 mg/dL (ref 0.2–1.2)
CO2: 26 mEq/L (ref 17–29)
Calcium: 9.8 mg/dL (ref 8.5–10.5)
Chloride: 105 mEq/L (ref 99–111)
Creatinine: 0.6 mg/dL (ref 0.4–1.0)
Globulin: 3 g/dL (ref 2.0–3.6)
Glucose: 99 mg/dL (ref 70–100)
Potassium: 3.9 mEq/L (ref 3.5–5.3)
Protein, Total: 7.3 g/dL (ref 6.0–8.3)
Sodium: 139 mEq/L (ref 135–145)

## 2020-11-07 LAB — CBC AND DIFFERENTIAL
Absolute NRBC: 0 10*3/uL (ref 0.00–0.00)
Basophils Absolute Automated: 0.08 10*3/uL (ref 0.00–0.08)
Basophils Automated: 0.9 %
Eosinophils Absolute Automated: 0.62 10*3/uL — ABNORMAL HIGH (ref 0.00–0.44)
Eosinophils Automated: 6.7 %
Hematocrit: 48.8 % — ABNORMAL HIGH (ref 34.7–43.7)
Hgb: 16.2 g/dL — ABNORMAL HIGH (ref 11.4–14.8)
Immature Granulocytes Absolute: 0.05 10*3/uL (ref 0.00–0.07)
Immature Granulocytes: 0.5 %
Lymphocytes Absolute Automated: 2.36 10*3/uL (ref 0.42–3.22)
Lymphocytes Automated: 25.6 %
MCH: 30.3 pg (ref 25.1–33.5)
MCHC: 33.2 g/dL (ref 31.5–35.8)
MCV: 91.2 fL (ref 78.0–96.0)
MPV: 9.7 fL (ref 8.9–12.5)
Monocytes Absolute Automated: 0.59 10*3/uL (ref 0.21–0.85)
Monocytes: 6.4 %
Neutrophils Absolute: 5.53 10*3/uL (ref 1.10–6.33)
Neutrophils: 59.9 %
Nucleated RBC: 0 /100 WBC (ref 0.0–0.0)
Platelets: 403 10*3/uL — ABNORMAL HIGH (ref 142–346)
RBC: 5.35 10*6/uL — ABNORMAL HIGH (ref 3.90–5.10)
RDW: 13 % (ref 11–15)
WBC: 9.23 10*3/uL (ref 3.10–9.50)

## 2020-11-07 LAB — HEMOLYSIS INDEX: Hemolysis Index: 5 Index (ref 0–24)

## 2020-11-07 LAB — LIPID PANEL
Cholesterol / HDL Ratio: 3.4 Index
Cholesterol: 190 mg/dL (ref 0–199)
HDL: 56 mg/dL (ref 40–9999)
LDL Calculated: 110 mg/dL — ABNORMAL HIGH (ref 0–99)
Triglycerides: 122 mg/dL (ref 34–149)
VLDL Calculated: 24 mg/dL (ref 10–40)

## 2020-11-07 LAB — GFR: EGFR: >60

## 2020-11-07 LAB — URINALYSIS WITH MICROSCOPIC
Bilirubin, UA: NEGATIVE
Blood, UA: NEGATIVE
Glucose, UA: NEGATIVE
Ketones UA: NEGATIVE
Nitrite, UA: NEGATIVE
Specific Gravity UA: 1.029 (ref 1.001–1.035)
Urine pH: 6.5 (ref 5.0–8.0)
Urobilinogen, UA: NORMAL mg/dL

## 2020-11-07 LAB — TSH: TSH: 0.57 u[IU]/mL (ref 0.35–4.94)

## 2020-11-07 LAB — HIV-1/2 AG/AB 4TH GEN. W/ REFLEX: HIV Ag/Ab, 4th Generation: NONREACTIVE

## 2020-11-07 LAB — HEMOGLOBIN A1C
Average Estimated Glucose: 114 mg/dL
Hemoglobin A1C: 5.6 % (ref 4.6–5.9)

## 2020-11-07 LAB — HEPATITIS C ANTIBODY: Hepatitis C, AB: NONREACTIVE

## 2020-11-07 LAB — VITAMIN D,25 OH,TOTAL: Vitamin D, 25 OH, Total: 21 ng/mL — ABNORMAL LOW (ref 30–100)

## 2020-11-07 LAB — C-REACTIVE PROTEIN: C-Reactive Protein: 0.6 mg/dL (ref 0.0–1.1)

## 2020-11-08 ENCOUNTER — Encounter (INDEPENDENT_AMBULATORY_CARE_PROVIDER_SITE_OTHER): Payer: Self-pay | Admitting: Family Medicine

## 2020-11-08 DIAGNOSIS — D751 Secondary polycythemia: Secondary | ICD-10-CM | POA: Insufficient documentation

## 2020-11-08 DIAGNOSIS — E559 Vitamin D deficiency, unspecified: Secondary | ICD-10-CM | POA: Insufficient documentation

## 2020-11-09 LAB — ANA IFA W/REFLEX TO TITER/PATTERN/AB: ANA Screen: NEGATIVE

## 2020-11-14 ENCOUNTER — Ambulatory Visit (INDEPENDENT_AMBULATORY_CARE_PROVIDER_SITE_OTHER): Payer: No Typology Code available for payment source | Admitting: Family Medicine

## 2020-11-14 ENCOUNTER — Encounter (INDEPENDENT_AMBULATORY_CARE_PROVIDER_SITE_OTHER): Payer: Self-pay | Admitting: Family Medicine

## 2020-11-14 VITALS — BP 149/90 | HR 104 | Temp 97.9°F | Ht 59.0 in | Wt 168.4 lb

## 2020-11-14 DIAGNOSIS — Z8673 Personal history of transient ischemic attack (TIA), and cerebral infarction without residual deficits: Secondary | ICD-10-CM | POA: Insufficient documentation

## 2020-11-14 DIAGNOSIS — R053 Chronic cough: Secondary | ICD-10-CM

## 2020-11-14 DIAGNOSIS — U099 Post covid-19 condition, unspecified: Secondary | ICD-10-CM

## 2020-11-14 DIAGNOSIS — I639 Cerebral infarction, unspecified: Secondary | ICD-10-CM | POA: Insufficient documentation

## 2020-11-14 DIAGNOSIS — K219 Gastro-esophageal reflux disease without esophagitis: Secondary | ICD-10-CM

## 2020-11-14 DIAGNOSIS — M130 Polyarthritis, unspecified: Secondary | ICD-10-CM

## 2020-11-14 DIAGNOSIS — Z0000 Encounter for general adult medical examination without abnormal findings: Secondary | ICD-10-CM

## 2020-11-14 MED ORDER — PANTOPRAZOLE SODIUM 40 MG PO TBEC
40.0000 mg | DELAYED_RELEASE_TABLET | Freq: Every day | ORAL | 1 refills | Status: DC
Start: 2020-11-14 — End: 2020-12-25

## 2020-11-14 MED ORDER — GABAPENTIN 300 MG PO CAPS
300.0000 mg | ORAL_CAPSULE | Freq: Every evening | ORAL | 1 refills | Status: DC
Start: 2020-11-14 — End: 2021-03-27

## 2020-11-14 MED ORDER — ALBUTEROL SULFATE HFA 108 (90 BASE) MCG/ACT IN AERS
2.0000 | INHALATION_SPRAY | Freq: Three times a day (TID) | RESPIRATORY_TRACT | 1 refills | Status: AC
Start: 2020-11-14 — End: 2021-11-14

## 2020-11-14 MED ORDER — VITAMIN D (ERGOCALCIFEROL) 1.25 MG (50000 UT) PO CAPS: 50000.0000 [IU] | ORAL_CAPSULE | ORAL | 1 refills | Status: DC

## 2020-11-14 MED ORDER — VENLAFAXINE HCL ER 75 MG PO CP24
75.0000 mg | ORAL_CAPSULE | Freq: Every day | ORAL | 1 refills | Status: DC
Start: 2020-11-14 — End: 2021-01-23

## 2020-11-14 MED ORDER — ACETAMINOPHEN ER 650 MG PO TBCR
650.0000 mg | EXTENDED_RELEASE_TABLET | Freq: Three times a day (TID) | ORAL | 1 refills | Status: DC | PRN
Start: 2020-11-14 — End: 2020-12-25
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
## 2020-11-14 NOTE — Progress Notes (Signed)
Have you seen any specialists/other providers since your last visit with us?    No    Arm preference verified?   Yes, no preference    Health Maintenance Due   Topic Date Due   • MAMMOGRAM  Never done

## 2020-12-19 ENCOUNTER — Ambulatory Visit (INDEPENDENT_AMBULATORY_CARE_PROVIDER_SITE_OTHER): Payer: No Typology Code available for payment source | Admitting: Family Medicine

## 2020-12-24 ENCOUNTER — Other Ambulatory Visit (INDEPENDENT_AMBULATORY_CARE_PROVIDER_SITE_OTHER): Payer: Self-pay | Admitting: Family Medicine

## 2020-12-24 DIAGNOSIS — K219 Gastro-esophageal reflux disease without esophagitis: Secondary | ICD-10-CM

## 2020-12-24 DIAGNOSIS — M130 Polyarthritis, unspecified: Secondary | ICD-10-CM

## 2021-01-23 ENCOUNTER — Other Ambulatory Visit (INDEPENDENT_AMBULATORY_CARE_PROVIDER_SITE_OTHER): Payer: Self-pay | Admitting: Family Medicine

## 2021-01-23 DIAGNOSIS — M130 Polyarthritis, unspecified: Secondary | ICD-10-CM

## 2021-01-23 DIAGNOSIS — Z0000 Encounter for general adult medical examination without abnormal findings: Secondary | ICD-10-CM

## 2021-03-11 NOTE — Progress Notes (Unsigned)
Language Spoken: {languages:[id number]}  Interpreter ID#:  {[id number]::"n/a, fluent in patient's spoken language"}    HPI:   This is a 51 y.o. female ***    HEALTH MAINTENANCE  Vaccination:  Influenza:   Tdap:   COVID:   Last PE:  Breast cancer screening:  Cervical cancer screening:   Colorectal cancer screening:      Past Medical History:   Diagnosis Date    COVID-19     Heart valve problem     Kidney stone     Pneumonia      Past Surgical History:   Procedure Laterality Date    CYST REMOVAL      GALLBLADDER SURGERY      HERNIA REPAIR      HYSTERECTOMY      TUBAL LIGATION       Family History   Problem Relation Age of Onset    Heart attack Mother     Diabetes Father     Heart attack Maternal Grandfather     Heart attack Paternal Grandfather     Stroke Son     Heart attack Maternal Uncle     Stomach cancer Cousin     Bone cancer Maternal Aunt     Colon cancer Neg Hx     Breast cancer Neg Hx      Social History     Tobacco Use    Smoking status: Never     Passive exposure: Never    Smokeless tobacco: Never   Vaping Use    Vaping Use: Never used   Substance Use Topics    Alcohol use: Never    Drug use: Never       ALLERGIES:     Allergies   Allergen Reactions    Benadryl [Diphenhydramine]     Dust Mite Extract     Methocarbamol Itching    Molds & Smuts     Other     Compazine [Prochlorperazine] Anxiety       MEDICATIONS:     Current Outpatient Medications:     acetaminophen (TYLENOL) 650 MG CR tablet, TAKE 1 TABLET BY MOUTH EVERY 8 HOURS AS NEEDED FOR PAIN., Disp: 90 tablet, Rfl: 1    albuterol sulfate HFA (PROVENTIL) 108 (90 Base) MCG/ACT inhaler, Inhale 1-2 puffs into the lungs every 4 (four) hours as needed, Disp: , Rfl:     albuterol sulfate HFA (PROVENTIL) 108 (90 Base) MCG/ACT inhaler, Inhale 2 puffs into the lungs 3 (three) times daily, Disp: 18 g, Rfl: 1    benzonatate (TESSALON) 200 MG capsule, Take 200 mg by mouth 3 (three) times daily as needed for Cough, Disp: , Rfl:     cetirizine (ZyrTEC) 10 MG  tablet, Take 10 mg by mouth daily, Disp: , Rfl:     diphenhydrAMINE-APAP, sleep, (Excedrin PM) 38-500 MG Tab, Take by mouth, Disp: , Rfl:     famotidine (PEPCID) 10 MG tablet, Take 10 mg by mouth 2 (two) times daily, Disp: , Rfl:     gabapentin (NEURONTIN) 300 MG capsule, Take 1 capsule (300 mg total) by mouth nightly, Disp: 30 capsule, Rfl: 1    naproxen (EC NAPROSYN) 500 MG EC tablet, Take 500 mg by mouth 2 (two) times daily with meals, Disp: , Rfl:     ondansetron (ZOFRAN) 8 MG tablet, Take by mouth every 8 (eight) hours as needed for Nausea, Disp: , Rfl:     pantoprazole (PROTONIX) 40 MG tablet, TAKE 1 TABLET BY MOUTH EVERY DAY,  Disp: 30 tablet, Rfl: 1    venlafaxine (EFFEXOR-XR) 75 MG 24 hr capsule, TAKE 1 CAPSULE BY MOUTH EVERY DAY, Disp: 30 capsule, Rfl: 1    vitamin D, ergocalciferol, (DRISDOL) 50000 UNIT Cap, Take 1 capsule (50,000 Units total) by mouth once a week, Disp: 12 capsule, Rfl: 1    PHYSICAL EXAM:   LMP 03/18/2012     Physical Exam    General: awake, alert, oriented x 3  Cardiovascular: S1, S2, regular rate and rhythm, no murmurs, rubs, or gallops  Lungs: clear to auscultation bilaterally, without wheezing, rhonchi, or rales  Abdomen: soft, non tender, normal bowel sounds  Extremities: no clubbing, cyanosis, or edema  Skin: no rashes or lesions noted    DIAGNOSTICS:     Lab Results   Component Value Date    WBC 9.23 11/07/2020    HGB 16.2 (H) 11/07/2020    HCT 48.8 (H) 11/07/2020    PLT 403 (H) 11/07/2020    CHOL 190 11/07/2020    TRIG 122 11/07/2020    HDL 56 11/07/2020    LDL 110 (H) 11/07/2020    ALT 19 11/07/2020    AST 17 11/07/2020    NA 139 11/07/2020    K 3.9 11/07/2020    CL 105 11/07/2020    CREAT 0.6 11/07/2020    BUN 10.0 11/07/2020    CO2 26 11/07/2020    TSH 0.57 11/07/2020    INR 1.2 (H) 07/05/2007    GLU 99 11/07/2020    HGBA1C 5.6 11/07/2020       No results found.    ACTIVE PROBLEMS:     Patient Active Problem List   Diagnosis    Dyspnea    GERD (gastroesophageal reflux disease)     Hypoxemia    Intractable chronic migraine without aura    Lumbar spondylosis    Obstructive sleep apnea (adult) (pediatric)    Unspecified astigmatism, bilateral    Uterine leiomyoma    Polycythemia    Vitamin D deficiency    Ischemic cerebrovascular accident (CVA)       ASSESSMENT and PLAN:       Return to clinic PRN  Follow-up in \  Patient verbalized understanding and agreed with treatment plan

## 2021-03-12 ENCOUNTER — Ambulatory Visit (INDEPENDENT_AMBULATORY_CARE_PROVIDER_SITE_OTHER): Payer: No Typology Code available for payment source | Admitting: Physician Assistant

## 2021-03-12 ENCOUNTER — Encounter (INDEPENDENT_AMBULATORY_CARE_PROVIDER_SITE_OTHER): Payer: Self-pay | Admitting: Physician Assistant

## 2021-03-12 VITALS — BP 131/89 | HR 95 | Temp 98.8°F | Resp 17 | Ht 59.06 in | Wt 167.0 lb

## 2021-03-12 DIAGNOSIS — H9201 Otalgia, right ear: Secondary | ICD-10-CM

## 2021-03-12 DIAGNOSIS — M542 Cervicalgia: Secondary | ICD-10-CM

## 2021-03-12 MED ORDER — NAPROXEN 500 MG PO TBEC
500.0000 mg | DELAYED_RELEASE_TABLET | Freq: Two times a day (BID) | ORAL | 1 refills | Status: DC
Start: 2021-03-12 — End: 2023-04-07

## 2021-03-12 MED ORDER — METHYLPREDNISOLONE 4 MG PO TBPK
ORAL_TABLET | ORAL | 0 refills | Status: AC
Start: 2021-03-12 — End: 2021-03-18

## 2021-03-19 ENCOUNTER — Ambulatory Visit (INDEPENDENT_AMBULATORY_CARE_PROVIDER_SITE_OTHER): Payer: No Typology Code available for payment source | Admitting: Physician Assistant

## 2021-03-27 ENCOUNTER — Ambulatory Visit (INDEPENDENT_AMBULATORY_CARE_PROVIDER_SITE_OTHER): Payer: No Typology Code available for payment source | Admitting: Family

## 2021-03-27 VITALS — BP 132/84 | HR 112 | Temp 98.1°F | Wt 170.5 lb

## 2021-03-27 DIAGNOSIS — M25552 Pain in left hip: Secondary | ICD-10-CM

## 2021-03-27 DIAGNOSIS — H9203 Otalgia, bilateral: Secondary | ICD-10-CM

## 2021-03-27 DIAGNOSIS — M25551 Pain in right hip: Secondary | ICD-10-CM

## 2021-03-27 DIAGNOSIS — G8929 Other chronic pain: Secondary | ICD-10-CM

## 2021-03-27 DIAGNOSIS — R102 Pelvic and perineal pain: Secondary | ICD-10-CM

## 2021-03-27 MED ORDER — OFLOXACIN 0.3 % OT SOLN
5.0000 [drp] | Freq: Every day | OTIC | 0 refills | Status: DC
Start: 2021-03-27 — End: 2023-03-21

## 2021-03-27 MED ORDER — PREGABALIN 75 MG PO CAPS
75.0000 mg | ORAL_CAPSULE | Freq: Two times a day (BID) | ORAL | 2 refills | Status: DC
Start: 2021-03-27 — End: 2023-03-21

## 2021-03-27 NOTE — Progress Notes (Unsigned)
Language Spoken: English  Interpreter ID#:  n/a, fluent in patient's spoken language    HPI:   This is a 51 y.o. female PMH depression, GERD, migraines    B/L ear pain - reports this has been present since 8 months ago, with ear pain and cough. Present since she was hospitalized with Covid.  She also has fever at night since 2 wks ago.     Pt had ER visit on 03/23/20 for dyspnea and hypoxia, was on home O2 at the time due to prolonged hospitalization for covid (hospitalized in [place]). No longer on O2 now.  She has an appt with pulm today in [place].    Reports having swelling in vaginal area and Pelvic pain. Reports it's along inguinal/hip areas. Reports hx of hysterectomy in 2021.  She has had this hip pain for awhile and reports that lyrica was effective for pain.       Past Medical History:   Diagnosis Date    COVID-19     Heart valve problem     Kidney stone     Pneumonia      Past Surgical History:   Procedure Laterality Date    CYST REMOVAL      GALLBLADDER SURGERY      HERNIA REPAIR      HYSTERECTOMY      TUBAL LIGATION       Family History   Problem Relation Age of Onset    Heart attack Mother     Diabetes Father     Heart attack Maternal Grandfather     Heart attack Paternal Grandfather     Stroke Son     Heart attack Maternal Uncle     Stomach cancer Cousin     Bone cancer Maternal Aunt     Colon cancer Neg Hx     Breast cancer Neg Hx      Social History     Tobacco Use    Smoking status: Never     Passive exposure: Never    Smokeless tobacco: Never   Vaping Use    Vaping Use: Never used   Substance Use Topics    Alcohol use: Never    Drug use: Never       ALLERGIES:     Allergies   Allergen Reactions    Benadryl [Diphenhydramine]     Dust Mite Extract     Methocarbamol Itching    Molds & Smuts     Other     Compazine [Prochlorperazine] Anxiety       MEDICATIONS:     Current Outpatient Medications:     acetaminophen (TYLENOL) 650 MG CR tablet, TAKE 1 TABLET BY MOUTH EVERY 8 HOURS AS NEEDED FOR PAIN.,  Disp: 90 tablet, Rfl: 1    albuterol sulfate HFA (PROVENTIL) 108 (90 Base) MCG/ACT inhaler, Inhale 1-2 puffs into the lungs every 4 (four) hours as needed, Disp: , Rfl:     albuterol sulfate HFA (PROVENTIL) 108 (90 Base) MCG/ACT inhaler, Inhale 2 puffs into the lungs 3 (three) times daily, Disp: 18 g, Rfl: 1    benzonatate (TESSALON) 200 MG capsule, Take 200 mg by mouth 3 (three) times daily as needed for Cough, Disp: , Rfl:     cetirizine (ZyrTEC) 10 MG tablet, Take 10 mg by mouth daily, Disp: , Rfl:     diphenhydrAMINE-APAP, sleep, (Excedrin PM) 38-500 MG Tab, Take by mouth, Disp: , Rfl:     famotidine (PEPCID) 10 MG tablet, Take 10  mg by mouth 2 (two) times daily, Disp: , Rfl:     naproxen (EC NAPROSYN) 500 MG EC tablet, Take 1 tablet (500 mg) by mouth 2 (two) times daily with meals, Disp: 60 tablet, Rfl: 1    ofloxacin (FLOXIN) 0.3 % otic solution, Place 5 drops into both ears daily, Disp: 10 mL, Rfl: 0    ondansetron (ZOFRAN) 8 MG tablet, Take by mouth every 8 (eight) hours as needed for Nausea, Disp: , Rfl:     pantoprazole (PROTONIX) 40 MG tablet, TAKE 1 TABLET BY MOUTH EVERY DAY, Disp: 30 tablet, Rfl: 1    pregabalin (LYRICA) 75 MG capsule, Take 1 capsule (75 mg) by mouth 2 (two) times daily, Disp: 60 capsule, Rfl: 2    venlafaxine (EFFEXOR-XR) 75 MG 24 hr capsule, TAKE 1 CAPSULE BY MOUTH EVERY DAY, Disp: 30 capsule, Rfl: 1    vitamin D, ergocalciferol, (DRISDOL) 50000 UNIT Cap, Take 1 capsule (50,000 Units total) by mouth once a week, Disp: 12 capsule, Rfl: 1    PHYSICAL EXAM:   BP 132/84 (BP Site: Left arm, Patient Position: Sitting, Cuff Size: Medium)   Pulse (!) 112   Temp 98.1 F (36.7 C) (Oral)   Wt 77.3 kg (170 lb 8 oz)   LMP 03/18/2012   SpO2 97%   BMI 34.37 kg/m     Physical Exam    General: awake, alert, oriented x 3  Ears: L ear: intact TM filled with serous fluid. No erythema noted. R ear: appears to have partially ruptured TM, no erythema or fluid noted  Cardiovascular: S1, S2, regular  rate and rhythm, no murmurs, rubs, or gallops  Lungs: clear to auscultation bilaterally, without wheezing, rhonchi, or rales  Abdomen: soft, non tender, normal bowel sounds  Extremities: no clubbing, cyanosis, or edema  Skin: no rashes or lesions noted    DIAGNOSTICS:     Lab Results   Component Value Date    WBC 9.23 11/07/2020    HGB 16.2 (H) 11/07/2020    HCT 48.8 (H) 11/07/2020    PLT 403 (H) 11/07/2020    CHOL 190 11/07/2020    TRIG 122 11/07/2020    HDL 56 11/07/2020    LDL 110 (H) 11/07/2020    ALT 19 11/07/2020    AST 17 11/07/2020    NA 139 11/07/2020    K 3.9 11/07/2020    CL 105 11/07/2020    CREAT 0.6 11/07/2020    BUN 10.0 11/07/2020    CO2 26 11/07/2020    TSH 0.57 11/07/2020    INR 1.2 (H) 07/05/2007    GLU 99 11/07/2020    HGBA1C 5.6 11/07/2020       No results found.    ACTIVE PROBLEMS:     Patient Active Problem List   Diagnosis    Dyspnea    GERD (gastroesophageal reflux disease)    Hypoxemia    Intractable chronic migraine without aura    Lumbar spondylosis    Obstructive sleep apnea (adult) (pediatric)    Unspecified astigmatism, bilateral    Uterine leiomyoma    Polycythemia    Vitamin D deficiency    Ischemic cerebrovascular accident (CVA)       ASSESSMENT and PLAN:   51 y.o. female      Valerie May was seen today for ear pain and fever.    Diagnoses and all orders for this visit:    Bilateral hip pain  -     pregabalin (LYRICA) 75 MG  capsule; Take 1 capsule (75 mg) by mouth 2 (two) times daily  -     XR Hip Left 2-3 vw without pelvis; Future  -     XR Hip Right 2-3 vw without pelvis; Future  -     Referral to Orthopaedic Sports Med    Chronic ear pain, bilateral  -     Ambulatory referral to ENT  -     ofloxacin (FLOXIN) 0.3 % otic solution; Place 5 drops into both ears daily    Vaginal pain  -     Ambulatory referral to Obstetrics / Gynecology      RTO in 1 month to f/u on symptoms.

## 2021-05-07 ENCOUNTER — Ambulatory Visit (INDEPENDENT_AMBULATORY_CARE_PROVIDER_SITE_OTHER): Payer: No Typology Code available for payment source | Admitting: Family

## 2021-07-12 ENCOUNTER — Encounter (HOSPITAL_COMMUNITY): Payer: Self-pay

## 2021-07-12 ENCOUNTER — Emergency Department (HOSPITAL_COMMUNITY)
Admission: EM | Admit: 2021-07-12 | Discharge: 2021-07-13 | Disposition: A | Discharge status: Home / Self Care | Payer: Medicaid Other | Attending: Emergency Medicine | Admitting: Emergency Medicine

## 2021-07-12 ENCOUNTER — Emergency Department (HOSPITAL_COMMUNITY): Payer: Medicaid Other

## 2021-07-12 ENCOUNTER — Other Ambulatory Visit: Payer: Self-pay

## 2021-07-12 DIAGNOSIS — R0600 Dyspnea, unspecified: Secondary | ICD-10-CM | POA: Insufficient documentation

## 2021-07-12 DIAGNOSIS — R0602 Shortness of breath: Secondary | ICD-10-CM | POA: Diagnosis present

## 2021-07-12 DIAGNOSIS — R062 Wheezing: Secondary | ICD-10-CM | POA: Diagnosis not present

## 2021-07-12 DIAGNOSIS — Z8616 Personal history of COVID-19: Secondary | ICD-10-CM | POA: Insufficient documentation

## 2021-07-12 DIAGNOSIS — Z20822 Contact with and (suspected) exposure to covid-19: Secondary | ICD-10-CM | POA: Insufficient documentation

## 2021-07-12 DIAGNOSIS — Z7951 Long term (current) use of inhaled steroids: Secondary | ICD-10-CM | POA: Diagnosis not present

## 2021-07-12 DIAGNOSIS — R0609 Other forms of dyspnea: Secondary | ICD-10-CM

## 2021-07-12 HISTORY — DX: Acquired absence of both cervix and uterus: Z90.710

## 2021-07-12 LAB — TROPONIN I (HIGH SENSITIVITY): Troponin I (High Sensitivity): 5 ng/L (ref <18)

## 2021-07-12 LAB — BASIC METABOLIC PANEL
Anion gap: 10 (ref 5–15)
BUN: 12 mg/dL (ref 6–20)
CO2: 26 mmol/L (ref 22–32)
Calcium: 9.2 mg/dL (ref 8.9–10.3)
Chloride: 105 mmol/L (ref 98–111)
Creatinine, Ser: 0.66 mg/dL (ref 0.44–1.00)
GFR, Estimated: >60 mL/min (ref >60)
Glucose, Bld: 228 mg/dL — ABNORMAL HIGH (ref 70–99)
Potassium: 4.1 mmol/L (ref 3.5–5.1)
Sodium: 141 mmol/L (ref 135–145)

## 2021-07-12 LAB — CBC
HCT: 46.1 % — ABNORMAL HIGH (ref 36.0–46.0)
Hemoglobin: 15.1 g/dL — ABNORMAL HIGH (ref 12.0–15.0)
MCH: 30.6 pg (ref 26.0–34.0)
MCHC: 32.8 g/dL (ref 30.0–36.0)
MCV: 93.5 fL (ref 80.0–100.0)
Platelets: 416 10*3/uL — ABNORMAL HIGH (ref 150–400)
RBC: 4.93 MIL/uL (ref 3.87–5.11)
RDW: 12.6 % (ref 11.5–15.5)
WBC: 14.6 10*3/uL — ABNORMAL HIGH (ref 4.0–10.5)
nRBC: 0 % (ref 0.0–0.2)

## 2021-07-12 LAB — I-STAT BETA HCG BLOOD, ED (MC, WL, AP ONLY): I-stat hCG, quantitative: <5 m[IU]/mL (ref <5)

## 2021-07-12 LAB — SARS CORONAVIRUS 2 BY RT PCR: SARS Coronavirus 2 by RT PCR: NEGATIVE

## 2021-07-12 MED ORDER — IPRATROPIUM-ALBUTEROL 0.5-2.5 (3) MG/3ML IN SOLN
3.0000 mL | RESPIRATORY_TRACT | Status: AC
  Administered 2021-07-12: 3 mL via RESPIRATORY_TRACT
  Filled 2021-07-12: qty 3

## 2021-07-12 MED ORDER — PREDNISONE 20 MG PO TABS
60.0000 mg | ORAL_TABLET | Freq: Once | ORAL | Status: AC
  Administered 2021-07-12: 60 mg via ORAL
  Filled 2021-07-12: qty 3

## 2021-07-12 NOTE — ED Notes (Signed)
Pt is now complaining of 10 out of 10 chest pain as I am hooking her up to the Bp, and o2 equipment. Pt states its in center of chest and radiates to her throat area

## 2021-07-12 NOTE — ED Notes (Signed)
Pt has been short of breath since yesterday. Pt doesn't have her meds. Pt complains of tightness in chest and can't catch breath. Pt lungs are auscultated and no wheezing is heard at this time. Pt does sound tight but no wheezing. Pt complains of pain in right side of chest from coughing.

## 2021-07-12 NOTE — ED Triage Notes (Signed)
Patient has had shortness of breath, cough, fever, chills since yesterday. She ran out of her inhaler.

## 2021-07-12 NOTE — ED Provider Triage Note (Signed)
Emergency Medicine Provider Triage Evaluation Note  Holly Morgan , a 51 y.o. female  was evaluated in triage.  Pt complains of shortness of breath. She states that her symptoms began yesterday with cough, congestion, fevers, and chills. She endorses hx of asthma, ran out of her inhaler and has been struggling to breath since then. She also endorses some chest pain that is worse with coughing.   Review of Systems  Positive:  Negative:   Physical Exam  BP (!) 131/111 (BP Location: Right Arm)   Pulse (!) 129   Temp 98.4 F (36.9 C) (Oral)   Resp 19   Ht 4\' 11"  (1.499 m)   Wt 78 kg   SpO2 94%   BMI 34.74 kg/m  Gen:   Awake, no distress   Resp:  Normal effort, mild wheezing present MSK:   Moves extremities without difficulty  Other:    Medical Decision Making  Medically screening exam initiated at 9:44 PM.  Appropriate orders placed.  Holly Morgan was informed that the remainder of the evaluation will be completed by another provider, this initial triage assessment does not replace that evaluation, and the importance of remaining in the ED until their evaluation is complete.     Holly Morgan 07/12/21 [id number]

## 2021-07-13 LAB — D-DIMER, QUANTITATIVE: D-Dimer, Quant: <0.27 ug/mL-FEU (ref 0.00–0.50)

## 2021-07-13 LAB — TROPONIN I (HIGH SENSITIVITY): Troponin I (High Sensitivity): 6 ng/L (ref <18)

## 2021-07-13 MED ORDER — SODIUM CHLORIDE 0.9 % IV BOLUS
500.0000 mL | Freq: Once | INTRAVENOUS | Status: AC
  Administered 2021-07-13: 500 mL via INTRAVENOUS

## 2021-07-13 MED ORDER — LORAZEPAM 2 MG/ML IJ SOLN
0.5000 mg | Freq: Once | INTRAMUSCULAR | Status: AC
Start: 2021-07-13 — End: 2021-07-13
  Administered 2021-07-13: 0.5 mg via INTRAVENOUS
  Filled 2021-07-13: qty 1

## 2021-07-13 MED ORDER — ALBUTEROL SULFATE HFA 108 (90 BASE) MCG/ACT IN AERS: 2.0000 | INHALATION_SPRAY | RESPIRATORY_TRACT | 0 refills | Status: DC | PRN

## 2021-07-13 MED ORDER — FLUTICASONE-SALMETEROL 250-50 MCG/ACT IN AEPB: 1.0000 | INHALATION_SPRAY | Freq: Two times a day (BID) | RESPIRATORY_TRACT | 0 refills | Status: DC

## 2021-07-13 MED ORDER — KETOROLAC TROMETHAMINE 30 MG/ML IJ SOLN
30.0000 mg | Freq: Once | INTRAMUSCULAR | Status: AC
  Administered 2021-07-13: 30 mg via INTRAVENOUS
  Filled 2021-07-13: qty 1

## 2021-07-13 MED ORDER — MAGNESIUM SULFATE 2 GM/50ML IV SOLN
2.0000 g | Freq: Once | INTRAVENOUS | Status: AC
  Administered 2021-07-13: 2 g via INTRAVENOUS
  Filled 2021-07-13: qty 50

## 2021-07-13 MED ORDER — ALBUTEROL SULFATE (2.5 MG/3ML) 0.083% IN NEBU
5.0000 mg | INHALATION_SOLUTION | Freq: Once | RESPIRATORY_TRACT | Status: AC
  Administered 2021-07-13: 5 mg via RESPIRATORY_TRACT
  Filled 2021-07-13: qty 6

## 2021-07-13 NOTE — Discharge Instructions (Addendum)
Get help right away if: You are getting worse and do not respond to treatment during an asthma attack. You are short of breath when at rest or when doing very little physical activity. You have difficulty eating, drinking, or talking. You have chest pain or tightness. You develop a fast heartbeat or palpitations. You have a bluish color to your lips or fingernails. You are light-headed or dizzy, or you faint. Your peak flow reading is less than 50% of your personal best. You feel too tired to breathe normally. 

## 2021-07-13 NOTE — ED Provider Notes (Signed)
[facility] DEPT Provider Note   CSN: [id number] Arrival date & time: 07/12/21  2016     History  Chief Complaint  Patient presents with   Shortness of Breath    Holly Morgan is a 51 y.o. female who presents emergency department with chief complaint of shortness of breath.  She has a past medical history of breathing difficulties and asthma after having COVID last year.  Patient has had persistent bronchitis on shortness of breath.  She previously was taking Trelegy however her insurance does not cover it and it cost $700 out-of-pocket and she is not been able to take it for some time.  She states that whenever she is not taking it her breathing seems to get worse.  Patient was recently on a course of oral steroids that she discontinued 3 days ago.  She has had worsening coughing, wheezing and shortness of breath since that time.  She has been using her albuterol inhaler without relief.  She complains of dyspnea is which is worse with exertion, coughing and pain in her chest with coughing.   Shortness of Breath      Home Medications Prior to Admission medications   Medication Sig Start Date End Date Taking? Authorizing Provider  ACETAMINOPHEN PO Take 1 tablet by mouth every 6 (six) hours as needed (pain).   Yes [provider]  albuterol (VENTOLIN HFA) 108 (90 Base) MCG/ACT inhaler Inhale 2 puffs into the lungs every 4 (four) hours as needed for wheezing or shortness of breath. 06/21/21  Yes [provider]  albuterol (VENTOLIN HFA) 108 (90 Base) MCG/ACT inhaler Inhale 2 puffs into the lungs every 4 (four) hours as needed for wheezing or shortness of breath. 07/13/21  Yes Meoshia Billing, PA-C  fluticasone-salmeterol (ADVAIR) 250-50 MCG/ACT AEPB Inhale 1 puff into the lungs in the morning and at bedtime. 07/13/21  Yes Carlo Lorson, PA-C  montelukast (SINGULAIR) 10 MG tablet Take 10 mg by mouth at bedtime.   Yes [provider]       Allergies    Benadryl [diphenhydramine] and Compazine [prochlorperazine]    Review of Systems   Review of Systems  Respiratory:  Positive for shortness of breath.     Physical Exam Updated Vital Signs BP 124/84 (BP Location: Right Arm)   Pulse (!) 103   Temp 98.2 F (36.8 C) (Oral)   Resp (!) 22   Ht 4\' 11"  (1.499 m)   Wt 78 kg   SpO2 95%   BMI 34.74 kg/m  Physical Exam Vitals and nursing note reviewed.  Constitutional:      General: She is not in acute distress.    Appearance: She is well-developed. She is not diaphoretic.  HENT:     Head: Normocephalic and atraumatic.     Right Ear: External ear normal.     Left Ear: External ear normal.     Nose: Nose normal.     Mouth/Throat:     Mouth: Mucous membranes are moist.  Eyes:     General: No scleral icterus.    Conjunctiva/sclera: Conjunctivae normal.  Cardiovascular:     Rate and Rhythm: Normal rate and regular rhythm.     Heart sounds: Normal heart sounds. No murmur heard.    No friction rub. No gallop.  Pulmonary:     Effort: Pulmonary effort is normal. No respiratory distress.     Breath sounds: Wheezing present.  Abdominal:     General: Bowel sounds are normal. There  is no distension.     Palpations: Abdomen is soft. There is no mass.     Tenderness: There is no abdominal tenderness. There is no guarding.  Musculoskeletal:     Cervical back: Normal range of motion.  Skin:    General: Skin is warm and dry.  Neurological:     Mental Status: She is alert and oriented to person, place, and time.  Psychiatric:        Behavior: Behavior normal.     ED Results / Procedures / Treatments   Labs (all labs ordered are listed, but only abnormal results are displayed) Labs Reviewed  BASIC METABOLIC PANEL - Abnormal; Notable for the following components:      Result Value   Glucose, Bld 228 (*)    All other components within normal limits  CBC - Abnormal; Notable for the following components:   WBC 14.6  (*)    Hemoglobin 15.1 (*)    HCT 46.1 (*)    Platelets 416 (*)    All other components within normal limits  SARS CORONAVIRUS 2 BY RT PCR  D-DIMER, QUANTITATIVE  I-STAT BETA HCG BLOOD, ED ([facility], [facility], [facility] ONLY)  TROPONIN I (HIGH SENSITIVITY)  TROPONIN I (HIGH SENSITIVITY)    EKG EKG Interpretation  Date/Time:  Friday July 12 2021 20:26:00 EDT Ventricular Rate:  122 PR Interval:  117 QRS Duration: 103 QT Interval:  318 QTC Calculation: 453 R Axis:   110 Text Interpretation: Sinus tachycardia Right axis deviation Consider anterior infarct Confirmed by Tilden Fossa [phone]) on 07/13/2021 2:13:10 PM  Radiology DG Chest 2 View  Result Date: 07/12/2021 CLINICAL DATA:  Shortness of breath, coughing, fever and chills. EXAM: CHEST - 2 VIEW COMPARISON:  PA Lat 04/08/2021 FINDINGS: The heart size and mediastinal contours are within normal limits. Both lungs are clear. The visualized skeletal structures are unremarkable apart from mild thoracic kyphosis. IMPRESSION: No active cardiopulmonary disease.  Stable chest. Electronically Signed   By: Almira Bar M.D.   On: 07/12/2021 21:08    Procedures Procedures    Medications Ordered in ED Medications  ipratropium-albuterol (DUONEB) 0.5-2.5 (3) MG/3ML nebulizer solution 3 mL (3 mLs Nebulization Not Given 07/12/21 2251)  predniSONE (DELTASONE) tablet 60 mg (60 mg Oral Given 07/12/21 2141)  magnesium sulfate IVPB 2 g 50 mL (0 g Intravenous Stopped 07/13/21 0335)  albuterol (PROVENTIL) (2.5 MG/3ML) 0.083% nebulizer solution 5 mg (5 mg Nebulization Given 07/13/21 0233)  LORazepam (ATIVAN) injection 0.5 mg (0.5 mg Intravenous Given 07/13/21 0233)  ketorolac (TORADOL) 30 MG/ML injection 30 mg (30 mg Intravenous Given 07/13/21 0232)  sodium chloride 0.9 % bolus 500 mL (0 mLs Intravenous Stopped 07/13/21 0303)  sodium chloride 0.9 % bolus 500 mL (0 mLs Intravenous Stopped 07/13/21 0521)    ED Course/ Medical Decision Making/ A&P Clinical Course as of  07/13/21 1823  Sat Jul 13, 2021  0445 D-dimer, quantitative [AH]  0445 Troponin I (High Sensitivity) [AH]  0445 I-Stat beta hCG blood, ED [AH]  0445 SARS Coronavirus 2 by RT PCR (hospital order, performed in [facility] hospital lab) *cepheid single result test* Anterior Nasal Swab [AH]  0445 CBC(!) [AH]  0445 WBC(!): 14.6 [AH]  0445 Glucose(!): 228 I suspect patient's elevated white blood cell count and elevated glucose are secondary to her recent antibiotic use. [AH]    Clinical Course User Index [AH] Arthor Captain, PA-C  Medical Decision Making Patient here with sob. The emergent differential diagnosis for shortness of breath includes, but is not limited to, Pulmonary edema, bronchoconstriction, Pneumonia, Pulmonary embolism, Pneumotherax/ Hemothorax, Dysrythmia, ACS.   I points and patient does not appear to have evidence of pulmonary embolism, ACS, pneumonia, pulmonary edema.  Patient given multiple rounds of albuterol with significant improvement in her symptoms.  Will refill the patient's inhaled steroid and long-acting beta agonist.  As well as short acting beta agonist.  Patient is advised to follow closely with her pulmonologist in [place].  Discussed outpatient follow-up and return precaution.  Amount and/or Complexity of Data Reviewed Labs: ordered. Decision-making details documented in ED Course. Radiology: ordered and independent interpretation performed.    Details: I ordered, reviewed and interpreted chest x-ray which shows no acute findings ECG/medicine tests: ordered and independent interpretation performed.    Details: EKG shows sinus tachycardia at a rate of 122  Risk Prescription drug management.           Final Clinical Impression(s) / ED Diagnoses Final diagnoses:  Chronic dyspnea  Wheezing    Rx / DC Orders ED Discharge Orders          Ordered    fluticasone-salmeterol (ADVAIR) 250-50 MCG/ACT AEPB  2 times daily         07/13/21 0452    albuterol (VENTOLIN HFA) 108 (90 Base) MCG/ACT inhaler  Every 4 hours PRN        07/13/21 0452              Arthor Captain, PA-C 07/13/21 1823    Gilda Crease, MD 07/16/21 0400

## 2021-07-13 NOTE — ED Notes (Signed)
Checked on pt. Pt seems calmer than when she was first assigned to me. Pt is still complaining of shortness of breath and chest pain and states she is just trying to stay calm. Pt is also asking for jello or something to eat. Pt was advised to wait until she sees doctor.

## 2021-07-13 NOTE — ED Notes (Signed)
Assisted pt to restroom. Pt advises her cough feels it is getting worse and her chest pain is getting worse. I advised I would let provider know.

## 2021-07-13 NOTE — ED Notes (Signed)
Initial fluid bolus was complete and pt started complaining about the mag burning. Per provider another 500 order is placed by me and started to dilute the mag so it would not cause discomfort for the pt.

## 2021-07-13 NOTE — ED Notes (Signed)
Pt is very sleepy and was encouraged to find someone to come pick her up and take her home as she stated she was driving herself. Pt stated she was ok and was only 5 mins away. I advised I could contact someone for her to take her home and she declined offer. She stated she lived just around the corner and would be fine. I advised it would be safer but again she declined offer for other transport means.

## 2021-07-14 ENCOUNTER — Telehealth (HOSPITAL_COMMUNITY): Payer: Self-pay | Admitting: Emergency Medicine

## 2021-07-14 MED ORDER — FLUTICASONE-SALMETEROL 250-50 MCG/ACT IN AEPB: 1.0000 | INHALATION_SPRAY | Freq: Two times a day (BID) | RESPIRATORY_TRACT | 0 refills | Status: AC

## 2021-07-14 MED ORDER — ALBUTEROL SULFATE HFA 108 (90 BASE) MCG/ACT IN AERS: 2.0000 | INHALATION_SPRAY | RESPIRATORY_TRACT | 0 refills | Status: AC | PRN

## 2021-07-14 MED ORDER — FLUTICASONE-SALMETEROL 250-50 MCG/ACT IN AEPB
1.0000 | INHALATION_SPRAY | Freq: Two times a day (BID) | RESPIRATORY_TRACT | 0 refills | Status: AC
Start: 2021-07-14 — End: ?

## 2021-07-14 MED ORDER — ALBUTEROL SULFATE HFA 108 (90 BASE) MCG/ACT IN AERS: 1.0000 | INHALATION_SPRAY | Freq: Four times a day (QID) | RESPIRATORY_TRACT | 0 refills | Status: AC | PRN

## 2021-07-14 NOTE — Telephone Encounter (Signed)
Patient requested to have pharmacy changed.  This was ordered.

## 2021-07-14 NOTE — Telephone Encounter (Signed)
Patient called nursing to report that she needs the inhaler sent to different pharmacy.  We will try to send it to the requested pharmacy for her.

## 2021-09-01 ENCOUNTER — Other Ambulatory Visit (INDEPENDENT_AMBULATORY_CARE_PROVIDER_SITE_OTHER): Payer: Self-pay | Admitting: Family Medicine

## 2021-09-01 DIAGNOSIS — M130 Polyarthritis, unspecified: Secondary | ICD-10-CM

## 2022-08-05 ENCOUNTER — Ambulatory Visit (INDEPENDENT_AMBULATORY_CARE_PROVIDER_SITE_OTHER): Payer: No Typology Code available for payment source | Admitting: Family Medicine

## 2022-10-30 NOTE — Progress Notes (Deleted)
[facility] PULMONARY OFFICE VISIT       Valerie May  is a 52 y.o.  female.    HPI DETAILS:  She is referred  for ***.  ***      The pt admitted on 10/15/2022 at [facility] for asthma exacerbation. She presented with cough, shortness of breath and wheezing.     She was admitted on 09/15/2022 at [facility] as she presented the emergency room with progressive cough, shortness of breath and bodyaches. She was admitted to medical service for acute respiratory failure secondary to viral and/or bacterial pneumonia. She was treated with nebulizer treatments, prednisone burst and antibiotics with improvement of symptoms. She was noted to have elevated bicarbonate during her hospital stay and VBG confirmed that she had some degree of CO2 retention but appeared to be compensated. All sedating medications were removed, she was trialed on BiPAP at nighttime and given aggressive pulmonary toilet. Her CO2 remained stable and suggesting that this in some degree is likely chronic.    She was suspected to have underlying obstructive sleep apnea and obesity hypoventilation syndrome given her BMI. She may also have some degree of chronic lung injury from what sounds like on history to be a very severe case of COVID 19 in the past. Given her continued stability she was discharged home on home oxygen. She has been on oxygen in the past.     Pt had ER visit on 03/23/20 for dyspnea and hypoxia, was on home O2 at the time due to prolonged hospitalization for covid (hospitalized in [place]). No longer on O2 now.       Medical History[1]    Past Surgical History[2]      Current Outpatient Medications:     acetaminophen (TYLENOL) 650 MG CR tablet, TAKE 1 TABLET BY MOUTH EVERY 8 HOURS AS NEEDED FOR PAIN, Disp: 90 tablet, Rfl: 1    albuterol sulfate HFA (PROVENTIL) 108 (90 Base) MCG/ACT inhaler, Inhale 1-2 puffs into the lungs every 4 (four) hours as needed, Disp: , Rfl:     benzonatate (TESSALON) 200 MG capsule, Take 200 mg by mouth  3 (three) times daily as needed for Cough, Disp: , Rfl:     cetirizine (ZyrTEC) 10 MG tablet, Take 10 mg by mouth daily, Disp: , Rfl:     diphenhydrAMINE-APAP, sleep, (Excedrin PM) 38-500 MG Tab, Take by mouth, Disp: , Rfl:     famotidine (PEPCID) 10 MG tablet, Take 10 mg by mouth 2 (two) times daily, Disp: , Rfl:     naproxen (EC NAPROSYN) 500 MG EC tablet, Take 1 tablet (500 mg) by mouth 2 (two) times daily with meals, Disp: 60 tablet, Rfl: 1    ofloxacin (FLOXIN) 0.3 % otic solution, Place 5 drops into both ears daily, Disp: 10 mL, Rfl: 0    ondansetron (ZOFRAN) 8 MG tablet, Take by mouth every 8 (eight) hours as needed for Nausea, Disp: , Rfl:     pantoprazole (PROTONIX) 40 MG tablet, TAKE 1 TABLET BY MOUTH EVERY DAY, Disp: 30 tablet, Rfl: 1    pregabalin (LYRICA) 75 MG capsule, Take 1 capsule (75 mg) by mouth 2 (two) times daily, Disp: 60 capsule, Rfl: 2    venlafaxine (EFFEXOR-XR) 75 MG 24 hr capsule, TAKE 1 CAPSULE BY MOUTH EVERY DAY, Disp: 30 capsule, Rfl: 1    vitamin D, ergocalciferol, (DRISDOL) 50000 UNIT Cap, Take 1 capsule (50,000 Units total) by mouth once a week, Disp: 12 capsule, Rfl: 1  Allergies[3]     Family History[4]     Social History[5]     Marital status  Etoh  Tobacco -   Vaping - none  Marijuana - none  Pets - no  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    Exposures:  Asbestosis exposure: No  Silica exposure, stone cutting: No  Welding: No  Metal cutting: No  Mining: No  Aerospace work: No  Mold/water damage in home or work-place: No  Prior radiation: No  Chemotherapy (bleomycin, methotrexate etc): No  Amiodarone use: No  Prolonged antibiotics: No  Agriculture exposures: No  Hot tub or swimming pool: No  Pets: No  Bird or down exposure: No    Review of Systems  ROS as per HPI.         Physical Exam  LMP 03/18/2012    General: appears comfortable, in no acute distress  HEENT: clear oropharynx  Neck: no lymphadenopathy  Lungs: {PNPLungExam:62119}  Heart: Reg  Extremities: no LE edema, no  clubbing  Neuro: AAOx3, no focal deficits   Derm no rashes/lesions on hands or finger tips    Pulmonary Diagnostics:    Spirometry on 10/31/2022: FVC: *** (***%), FEV1: *** (***%), FEV1/FVC: ***% - ***    :    FeNO:     CBC  Lab Results   Component Value Date    WBC 9.23 11/07/2020    HCT 48.8 (H) 11/07/2020    HGB 16.2 (H) 11/07/2020    PLT 403 (H) 11/07/2020    EOSABS 0.5 07/05/2007     Absolute eosinophil on 11/07/2020: 620   ANA on 11/07/2020:Negative      CTD labs    Immunoglobulin levels. N/A    Imaging  I have personally reviewed {PNPTestReview:62120}  CTA chest on 10/15/2022 at [facility]: 1.  No CT evidence for central pulmonary emboli. 2.  Mild bronchiectasis involving the anterior upper lobes bilaterally. 3.  No lobar consolidations, pleural effusions or pneumothorax.     CXR on 09/18/2022 at [facility]: Subtle airspace disease seen posteriorly on lateral view. No clear evidence for progressive disease.    CTA chest on 09/15/2022 at [facility]: 1.  No PE through the segmental level. 2.  Focal airspace opacities in both lung bases indicate pneumonia    Echocardiogram:    Polysomnography:           ASSESSMENT:  ***    There are no diagnoses linked to this encounter.    PLAN:  ***    No follow-ups on file. or sooner if any new symptoms or concerns.       Herminio Heads, MD    Pulmonary, Critical Care and Sleep Medicine.       [1]   Past Medical History:  Diagnosis Date    COVID-19     Heart valve problem     Kidney stone     Pneumonia    [2]   Past Surgical History:  Procedure Laterality Date    CYST REMOVAL      GALLBLADDER SURGERY      HERNIA REPAIR      HYSTERECTOMY      TUBAL LIGATION     [3]   Allergies  Allergen Reactions    Benadryl [Diphenhydramine]     Dust Mite Extract     Methocarbamol Itching    Molds & Smuts     Other     Compazine [Prochlorperazine] Anxiety   [4]   Family History  Problem Relation  Name Age of Onset    Heart attack Mother      Diabetes Father      Heart attack Maternal  Grandfather      Heart attack Paternal Grandfather      Stroke Son      Heart attack Maternal Uncle      Stomach cancer Cousin      Bone cancer Maternal Aunt      Colon cancer Neg Hx      Breast cancer Neg Hx     [5]   Social History  Tobacco Use    Smoking status: Never     Passive exposure: Never    Smokeless tobacco: Never   Vaping Use    Vaping status: Never Used   Substance Use Topics    Alcohol use: Never    Drug use: Never

## 2022-10-31 ENCOUNTER — Telehealth (INDEPENDENT_AMBULATORY_CARE_PROVIDER_SITE_OTHER): Payer: Self-pay

## 2022-10-31 ENCOUNTER — Ambulatory Visit (INDEPENDENT_AMBULATORY_CARE_PROVIDER_SITE_OTHER): Payer: Self-pay | Admitting: Critical Care Medicine

## 2022-10-31 NOTE — Telephone Encounter (Signed)
Left VM informing pt we have a NP slot 11/08 10:50 am with Dr. Illene Bolus in [facility] if she wants to take it, please help her schedule thank you!

## 2022-12-08 ENCOUNTER — Telehealth (INDEPENDENT_AMBULATORY_CARE_PROVIDER_SITE_OTHER): Payer: Self-pay | Admitting: Internal Medicine

## 2022-12-08 NOTE — Telephone Encounter (Signed)
 Contacted Valerie May to remind them of their upcoming new patient appointment on 12/09/2022  Reminded them to arrive 15 min prior to their appointment scheduled time and to have insurance card, referral if one is needed, any pertinent records and imag

## 2022-12-09 ENCOUNTER — Encounter (INDEPENDENT_AMBULATORY_CARE_PROVIDER_SITE_OTHER): Payer: Self-pay | Admitting: Internal Medicine

## 2022-12-09 ENCOUNTER — Ambulatory Visit (INDEPENDENT_AMBULATORY_CARE_PROVIDER_SITE_OTHER): Payer: No Typology Code available for payment source | Admitting: Internal Medicine

## 2022-12-09 VITALS — BP 126/90 | HR 111 | Temp 97.4°F | Resp 12 | Ht 59.0 in | Wt 175.0 lb

## 2022-12-09 DIAGNOSIS — Z8701 Personal history of pneumonia (recurrent): Secondary | ICD-10-CM

## 2022-12-09 DIAGNOSIS — D7219 Other eosinophilia: Secondary | ICD-10-CM

## 2022-12-09 DIAGNOSIS — G4710 Hypersomnia, unspecified: Secondary | ICD-10-CM

## 2022-12-09 DIAGNOSIS — J479 Bronchiectasis, uncomplicated: Secondary | ICD-10-CM

## 2022-12-09 DIAGNOSIS — K219 Gastro-esophageal reflux disease without esophagitis: Secondary | ICD-10-CM

## 2022-12-09 DIAGNOSIS — J4550 Severe persistent asthma, uncomplicated: Secondary | ICD-10-CM

## 2022-12-09 DIAGNOSIS — R053 Chronic cough: Secondary | ICD-10-CM

## 2022-12-09 DIAGNOSIS — R9389 Abnormal findings on diagnostic imaging of other specified body structures: Secondary | ICD-10-CM

## 2022-12-09 DIAGNOSIS — J3089 Other allergic rhinitis: Secondary | ICD-10-CM

## 2022-12-09 DIAGNOSIS — R0602 Shortness of breath: Secondary | ICD-10-CM

## 2022-12-09 DIAGNOSIS — Z8616 Personal history of COVID-19: Secondary | ICD-10-CM

## 2022-12-09 LAB — NITRIC OXIDE: FENO: 31

## 2022-12-09 MED ORDER — ACETAMINOPHEN-CODEINE 300-30 MG PO TABS
1.0000 | ORAL_TABLET | ORAL | 0 refills | Status: AC | PRN
Start: 2022-12-09 — End: 2022-12-30

## 2022-12-09 MED ORDER — ALBUTEROL-IPRATROPIUM 2.5-0.5 (3) MG/3ML IN SOLN
3.0000 mL | Freq: Once | RESPIRATORY_TRACT | Status: AC
Start: 2022-12-09 — End: 2022-12-09
  Administered 2022-12-09: 3 mL via RESPIRATORY_TRACT

## 2022-12-09 MED ORDER — BUDESON-GLYCOPYRROL-FORMOTEROL 160-9-4.8 MCG/ACT IN AERO
2.0000 | INHALATION_SPRAY | Freq: Two times a day (BID) | RESPIRATORY_TRACT | 5 refills | Status: AC
Start: 2022-12-09 — End: ?

## 2022-12-09 NOTE — Progress Notes (Signed)
 [facility] PULMONARY CONSULT    Patient Name: Valerie May, Valerie May    Date of Visit:  12/09/2022  Date of Birth: 09-02-1970  AGE: 52 y.o.  Medical Record #: [id number]  Requesting Physician: Serina Cowper, FNP    HISTORY OF PRESENT ILLNESS:  SHEANNA DAIL  is a 78 y

## 2022-12-10 DIAGNOSIS — R053 Chronic cough: Secondary | ICD-10-CM | POA: Insufficient documentation

## 2022-12-10 DIAGNOSIS — J45909 Unspecified asthma, uncomplicated: Secondary | ICD-10-CM | POA: Insufficient documentation

## 2022-12-10 DIAGNOSIS — Z8701 Personal history of pneumonia (recurrent): Secondary | ICD-10-CM | POA: Insufficient documentation

## 2022-12-10 DIAGNOSIS — J4550 Severe persistent asthma, uncomplicated: Secondary | ICD-10-CM | POA: Insufficient documentation

## 2022-12-10 DIAGNOSIS — D7219 Other eosinophilia: Secondary | ICD-10-CM | POA: Insufficient documentation

## 2022-12-10 DIAGNOSIS — R9389 Abnormal findings on diagnostic imaging of other specified body structures: Secondary | ICD-10-CM | POA: Insufficient documentation

## 2022-12-10 DIAGNOSIS — Z8616 Personal history of COVID-19: Secondary | ICD-10-CM | POA: Insufficient documentation

## 2022-12-10 DIAGNOSIS — J479 Bronchiectasis, uncomplicated: Secondary | ICD-10-CM | POA: Insufficient documentation

## 2022-12-10 NOTE — Patient Instructions (Signed)
 Please call [phone] to schedule your sleep study  Results take 7-14 days after the test has been completed  Please ensure you have a follow up appointment with your ordering physician to review the study

## 2022-12-11 LAB — SPIROMETRY
FEF 25-75% (Post-Bronch) %Chng: -32 %
FEF 25-75% (Post-Bronch) %Pred: 66 %
FEF 25-75% (Post-Bronch) Actual: 1.53 L/s
FEF 25-75% (Pre-Bronch) %Pred: 98 %
FEF 25-75% (Pre-Bronch) Actual: 2.29 L/s
FEF 25-75% (Pre-Bronch) Pred: 2.31 L/s
FEF Max (Post-Bronch) %Chng: -45 %
FEF Max (Post-Bronch) %Pred: 44 %
FEF Max (Post-Bronch) Actual: 2.69 L/s
FEF Max (Pre-Bronch) %Pred: 82 %
FEF Max (Pre-Bronch) Actual: 4.94 L/s
FEF Max (Pre-Bronch) Pred: 6 L/s
FEF2575 (Lower Limit of Normal): 1.29 L/s
FEF2575 (Standard Deviation): 0.7 L/s
FEFMax (Lower Limit of Normal): 4.54 L/s
FEFMax (Standard Deviation): 0.88 L/s
FEV1 (Lower Limit of Normal): 1.69 L
FEV1 (Post-Bronch) %Chng: -5 %
FEV1 (Post-Bronch) %Pred: 68 %
FEV1 (Post-Bronch) Post: 1.49 L
FEV1 (Pre-Bronch) %Pred: 72 %
FEV1 (Pre-Bronch) Actual: 1.58 L
FEV1 (Pre-Bronch) Pred: 2.17 L
FEV1 (Standard Deviation): 0.29 L
FEV1/FVC (Pre-Bronch) %Pred: 109 %
FEV1/FVC (Pre-Bronch) Actual: 90 %
FEV1/FVC (Pre-Bronch) Pred: 82 %
FVC (Lower Limit of Normal): 2.1 L
FVC (Post-Bronch) %Chng: 3 %
FVC (Post-Bronch) %Pred: 68 %
FVC (Post-Bronch) Actual: 1.81 L
FVC (Pre-Bronch) %Pred: 66 %
FVC (Pre-Bronch) Actual: 1.76 L
FVC (Pre-Bronch) Pred: 2.64 L
FVC (Standard Deviation): 0.34 L
PEF (% Change-Post): -45 %
PEF (Post): 161.6 L/min
PEF (Pre-Actual): 296.5 L/min

## 2022-12-30 ENCOUNTER — Ambulatory Visit (INDEPENDENT_AMBULATORY_CARE_PROVIDER_SITE_OTHER): Payer: Self-pay | Admitting: Internal Medicine

## 2022-12-30 NOTE — Progress Notes (Deleted)
[facility]    Patient Name: Valerie May, Valerie May    Date of Visit:  12/30/2022  Date of Birth: 1970-08-08  AGE: 52 y.o.  Medical Record #: [id number]  Requesting Physician: Serina Cowper, FNP    HISTORY OF PRESENT ILLNESS:  Valerie May  is a 52 y.o.  female, with a history of asthma, eosinophilia (500 on 11/01/2022, 600 on 10/15/2022, 800 on 04/04/2021 and 620 on 11/07/2020), mild bronchiectasis, COVID-19 pneumonia (2021), recurrent pneumonia, recurrent bronchitis, acute respiratory failure with hypoxia and hypercapnia (resolved), sinus disease (s/p sinus surgery), GERD, type II diabetes mellitus, polycythemia, migraines, anxiety, lumbar spondylosis and obesity, who comes in today for an evaluation and management of her shortness of breath.     Initial History 12/09/2022  The pt states that she got sick with a severe but of COVID-19 pneumonia in 2021 and she has had ongoing issues since. She complains of shortness of breath and a persistent moderate cough. She notes that she coughs all the time and her chest, head and ears tend to hurt when she coughs. She occasionally gets a bloody taste in her mouth when she has lots of coughing. Her cough feels productive, but she has a hard time coughing up the mucus. However, she is able to cough up lots of clear mucus when she takes hot showers. She also occasionally has some light yellow mucus production. She currently has Tessalon perles 200 mg to use prn for cough, but they do not help very much. The pt also complains of chest and nasal congestion and sinus drainage. The pt states that she currently does not have any other concerning respiratory symptoms. She denies wheezing, chest tightness, hemoptysis, fever, chills.     The pt states that she did not have a hx of asthma, pneumonia or any respiratory symptoms prior to getting sick with COVID in 2021. However, she has been diagnosed with asthma, recurrent pneumonia and recurrent bronchitis multiple times since her  COVID-19 infection. She notes that she gets sick very easily after getting exposed to sick people around her. She travels a lot for work and she has been seen in an ER all over the country multiple times for her ongoing symptoms and she has been diagnosed with either asthma flares, pneumonia or bronchitis about 6-7x in the past year. Her most recent hospitalization was in October 2024 at [facility] and she was told that she had a lot of congestion in her lungs. She was treated for pneumonia and an asthma exacerbation.     She has been on Symbicort 160 mcg BID, Albuterol HFA and Singulair 10 mg QHS for asthma for about 1.5 years now. The pt states that she consulted with a different pulmonologist last year and she was prescribed Trelegy which helped a lot, but it was not covered by insurance. She was also given nebulized Albuterol by her PCP, but she was never set up with a nebulizer machine.     She has never been officially diagnosed with allergies, but she believes that she has allergies because she will occasionally get a rash if she touches dust. She has never had any allergy testing done in the past.     The pt is a lifelong nonsmoker.     The pt denies any exposure to asbestos or strong chemicals. She currently does not have any pets at home.     ROS notable for poorly restful sleep and daytime fatigue.  She snores at night and has  history of witnessed apneas.      Baseline regimen: Symbicort 160 mcg BID, Albuterol HFA prn, Singulair 10 mg QHS, Pantoprazole 40 mg QD prn, Tessalon perles 200 mg TID prn  Previous regimen: Trelegy (not covered), Zyrtec QD, Pepcid 10 mg BID        MEDICATIONS:    Current Outpatient Medications:   .  acetaminophen (TYLENOL) 650 MG CR tablet, TAKE 1 TABLET BY MOUTH EVERY 8 HOURS AS NEEDED FOR PAIN (Patient taking differently: every 8 (eight) hours as needed), Disp: 90 tablet, Rfl: 1  .  acetaminophen-codeine (TYLENOL #3) 300-30 MG per tablet, Take 1 tablet by mouth every 4  (four) hours as needed for Pain, Disp: 40 tablet, Rfl: 0  .  albuterol sulfate HFA (PROVENTIL) 108 (90 Base) MCG/ACT inhaler, Inhale 1-2 puffs into the lungs every 4 (four) hours as needed, Disp: , Rfl:   .  benzonatate (TESSALON) 200 MG capsule, Take 1 capsule (200 mg) by mouth 3 (three) times daily as needed for Cough, Disp: , Rfl:   .  Budeson-Glycopyrrol-Formoterol 160-9-4.8 MCG/ACT Aerosol, Inhale 2 puffs into the lungs 2 (two) times daily, Disp: 10.7 g, Rfl: 5  .  budesonide-formoterol (SYMBICORT) 160-4.5 MCG/ACT inhaler, Inhale 2 puffs into the lungs 2 (two) times daily as needed, Disp: , Rfl:   .  cetirizine (ZyrTEC) 10 MG tablet, Take 10 mg by mouth daily (Patient not taking: Reported on 12/09/2022), Disp: , Rfl:   .  [employer] Aspirin Low Dose 81 MG EC tablet, Take 1 tablet (81 mg) by mouth daily, Disp: , Rfl:   .  diphenhydrAMINE-APAP, sleep, (Excedrin PM) 38-500 MG Tab, Take by mouth daily, Disp: , Rfl:   .  famotidine (PEPCID) 10 MG tablet, Take 10 mg by mouth 2 (two) times daily (Patient not taking: Reported on 12/09/2022), Disp: , Rfl:   .  metFORMIN (GLUCOPHAGE) 500 MG tablet, Take 1 tablet (500 mg) by mouth 2 (two) times daily with meals, Disp: , Rfl:   .  methocarbamol (ROBAXIN) 750 MG tablet, as needed, Disp: , Rfl:   .  montelukast (SINGULAIR) 10 MG tablet, Take 1 tablet (10 mg) by mouth nightly, Disp: , Rfl:   .  naproxen (EC NAPROSYN) 500 MG EC tablet, Take 1 tablet (500 mg) by mouth 2 (two) times daily with meals (Patient not taking: Reported on 12/09/2022), Disp: 60 tablet, Rfl: 1  .  ofloxacin (FLOXIN) 0.3 % otic solution, Place 5 drops into both ears daily (Patient not taking: Reported on 12/09/2022), Disp: 10 mL, Rfl: 0  .  ondansetron (ZOFRAN) 8 MG tablet, Take by mouth every 8 (eight) hours as needed for Nausea (Patient not taking: Reported on 12/09/2022), Disp: , Rfl:   .  pantoprazole (PROTONIX) 40 MG tablet, TAKE 1 TABLET BY MOUTH EVERY DAY (Patient taking differently: as needed), Disp: 30  tablet, Rfl: 1  .  pregabalin (LYRICA) 75 MG capsule, Take 1 capsule (75 mg) by mouth 2 (two) times daily (Patient not taking: Reported on 12/09/2022), Disp: 60 capsule, Rfl: 2  .  venlafaxine (EFFEXOR-XR) 75 MG 24 hr capsule, TAKE 1 CAPSULE BY MOUTH EVERY DAY (Patient not taking: Reported on 12/09/2022), Disp: 30 capsule, Rfl: 1  .  vitamin D, ergocalciferol, (DRISDOL) 50000 UNIT Cap, Take 1 capsule (50,000 Units total) by mouth once a week, Disp: 12 capsule, Rfl: 1    There were no vitals filed for this visit.       Physical Exam  Constitutional:  Appearance: Normal appearance.   Cardiovascular:      Rate and Rhythm: Normal rate and regular rhythm.      Pulses: Normal pulses.      Heart sounds: Normal heart sounds.   Pulmonary:      Effort: Pulmonary effort is normal.      Breath sounds: Normal breath sounds. No wheezing, rhonchi or rales.   Musculoskeletal:      Comments: No lower extremity edema   Neurological:      Mental Status: She is alert.   Psychiatric:         Mood and Affect: Mood normal.         Behavior: Behavior normal.       PULMONARY DIAGNOSTICS:  Spirometry on 12/09/2022: FVC 1.76 (66%), FEV1 1.58 (72%), FEV1/FVC 90%--Moderate restriction--Post: FVC 1.81 (68%) 3% change. No significant improvement post bronchodilator    FeNO on 12/09/2022: 31 ppb      LABS:  Absolute eosinophils done on 11/01/2022: 500  Absolute eosinophils done on 10/15/2022: 600   Absolute eosinophils done on 09/15/2022: 600  Absolute eosinophils done on 07/28/2021: 600  Absolute eosinophils done on 04/04/2021: 800  Absolute eosinophils done on 11/07/2020: 620    IMAGING:  CXR AP portable done on 11/01/2022 at [facility]. Lissa Hoard was personally reviewed and demonstrates: No acute cardiopulmonary process.       CTA chest done on 10/15/2022 at [facility] was personally reviewed and demonstrates: No CT evidence for central pulmonary emboli. Mild bronchiectasis involving the anterior upper lobes  bilaterally. No lobar consolidations, pleural effusions or pneumothorax.     CXR PA and LAT views done on 09/18/2022 at [facility] was personally reviewed and demonstrates: Subtle airspace disease seen posteriorly on lateral view. No clear evidence for progressive disease.     CTA chest done on 09/15/2022 at [facility] was personally reviewed and demonstrates: No PE through the segmental level. Focal airspace opacities in both lung bases indicate pneumonia.     CXR Ap view done on 07/28/2021 at [facility] was personally reviewed and demonstrates: No acute finding.     CXR PA and LAT view done on 04/08/2021 at [facility] was personally reviewed and demonstrates: Peribronchial thickening bilaterally suggesting bronchitis, not significantly changed from prior exams.     CXR PA and LAT view done on 04/04/2021 at [facility] was personally reviewed and demonstrates: Bronchitis without evidence of focal pneumonia. Similar findings previously.     CTA chest done on 08/12/2020 at [facility] was personlly reviewed and demonstrates: Scattered vague areas of groundglass change. Bilateral anterior upper lobe atelectasis. No evidence of pulmonary embolus or aortic dissection. Other nonacute findings. Recommendation: Follow up as clinically indicated.     CTA chest, abdomen and pelvis done on 03/23/2020 at [facility] was personally reviewed and demonstrates: Prominent pulmonary trunk indicating arterial hypertension. No contrast opacification distal to the main lobar arteries due to bolus timing. Segmental and subsegmental emboli are not excluded but there is no central embolus. Generalized groundglass attenuation throughout the lungs most likely due to groundglass fibrosis. There is consolidation with ectatic air bronchograms in the anterior segments of the upper lobes which could reflect fibrotic consolidation or residual active pneumonia, with patchy haziness in the lower lobes which  could be chronic change or additional pneumonitis. Clinical correlation and short interval follow-up study recommended. Mildly enlarged mediastinal and hilar lymph nodes possibly reactive. Attention to this on short interval follow-up study  recommended. Rodlike 9 x 3 x 3 mm left UVJ stone, without hydroureteronephrosis. Additional nonobstructing intrarenal stones. 1.9 cm low-density lesion above the usual density of fluid in the right kidney. Routine ultrasound follow-up recommended. Constipation and diverticulosis. Fatty liver. Minimal ascites in the pelvis, nonspecific. Cystitis versus bladder nondistention.     CT sinus facial bones done on 03/24/2020 at [facility] was personally reviewed and demonstrates: Sinus membrane disease without fluid levels, with similar disease to 2011 study except that there is no longer any left sphenoid sinus disease. Interval maxillary antrostomies, ethmoidectomies and removal of left-sided septal spur, with mild septal deviation to the left and inferior spurring remaining. No significant dental caries and no periapical root abscess formations. DJD and remodeling both TMJs. Prominent palatine tonsils left greater than right. Suggest direct visualization although this does appear to have been present previously. No underlying abscess is seen.         IMPRESSION:  Ms. Degrandis is a 52 y.o. female with the following problems:    Severe persistent asthma  Her spirometry today shows that she has moderate restriction with an FVC of 66%. She had no significant improvement post bronchodilator treatment.   Her FeNO today is 31 ppb, which is slightly elevated and suggests mild airway inflammation.   She has poorly controlled asthma possibly with poorly controlled allergies. She should benefit from step up therapy, by adding Breztri, to her regimen to get her asthma under better control.   We will also get lab work to check her allergy profile.     Eosinophilia  500 on 11/01/2022  600 on  10/15/2022, 09/15/2022 and 07/28/2021  800 on 04/04/2021  620 on 11/07/2020  She is a good candidate for biologic therapy given her eosinophilia. However, we will hold off on initiating any biologic therapy at this time and we will try to get her asthma under better control with daily inhaler therapy. If her asthma worsens or becomes hard to control with inhaler therapy, then we may reconsider initiating biologics.    COVID-19 pneumonia (2021)  She had a severe bout of COVID pneumonia in 2021 that required a prolonged hospitalization, but no intubation.     Recurrent pneumonia  She has been treated for recurrent pneumonia multiple times since 2021.   The etiology of her recurrent pneumonia is unclear, but we will get labs to check her immune profile to evaluate for CVID. We will also get labs to rule out other non-infectious causes like an auto immune disease.     Bronchiectasis  Her chest CTA chest done on 10/15/2022 shows that she has mild bronchiectasis involving the anterior upper lobes bilaterally.     Sinus disease   S/p sinus surgery    GERD  Currently on prn Pantoprazole    Snoring / hypersomnolence  Will order home sleep study    RECOMMENDATIONS:  Stop Symbicort 160 mcg 2 puffs BID  Start Breztri 2 puffs BID--Pt is advised to rinse and spit after each use to prevent thrush  Continue Albuterol HFA 2 puffs every 4-6 hours prn for shortness of breath, wheezing, chest tightness   Start nebulized Albuterol HFA every 4-6 hours prn for shortness of breath, wheezing, chest tightness   Order for a new nebulizer machine and supplies sent today  Continue Singulair 10 mg QHS   Start Codeine cough syrup prn for cough  Continue Tessalon perles 200 mg TID prn for cough  Continue Pantoprazole 40 mg QD prn  Pt is advised to  stop eating/drinking at least 2-3 hours prior to bed time. She should also limit caffeine, carbonated drinks, alcohol, chocolate, acidic and spicy foods from her diet.   Order for CBC w/ diff, IgE, RAST,  Food allergy panel, Aspergillus antibodies, IgA, IgM, IgG, IgG subclasses, ANA, Rheumatoid factor  Will order home sleep study  Follow up in 4-6 weeks. Pt is advised to give [place] a call sooner if she has any worsening respiratory issues.                                                    No orders of the defined types were placed in this encounter.    A total of 68 minutes was spent on this visit reviewing previous notes, counseling the patient and/or family members regarding the patient's condition(s), ordering of tests, managing medications, and documenting the findings in the note.    I personally scribed for Loman Chroman, MD MD on 12/30/2022. The documentation recorded by the scribe, Arnoldo Morale, accurately reflects the service I personally performed and the decisions made by me, Loman Chroman, MD, MD.     SIGNED:  Loman Chroman, MD

## 2023-01-29 ENCOUNTER — Ambulatory Visit: Payer: No Typology Code available for payment source

## 2023-02-24 IMAGING — CR DG CHEST 2V
2 series · 2 of 2 positions shown · non-contrast
Comparison: PA Lat 04/08/2021

CLINICAL DATA: Shortness of breath, coughing, fever and chills.

EXAM:
CHEST - 2 VIEW

[w chest pa]
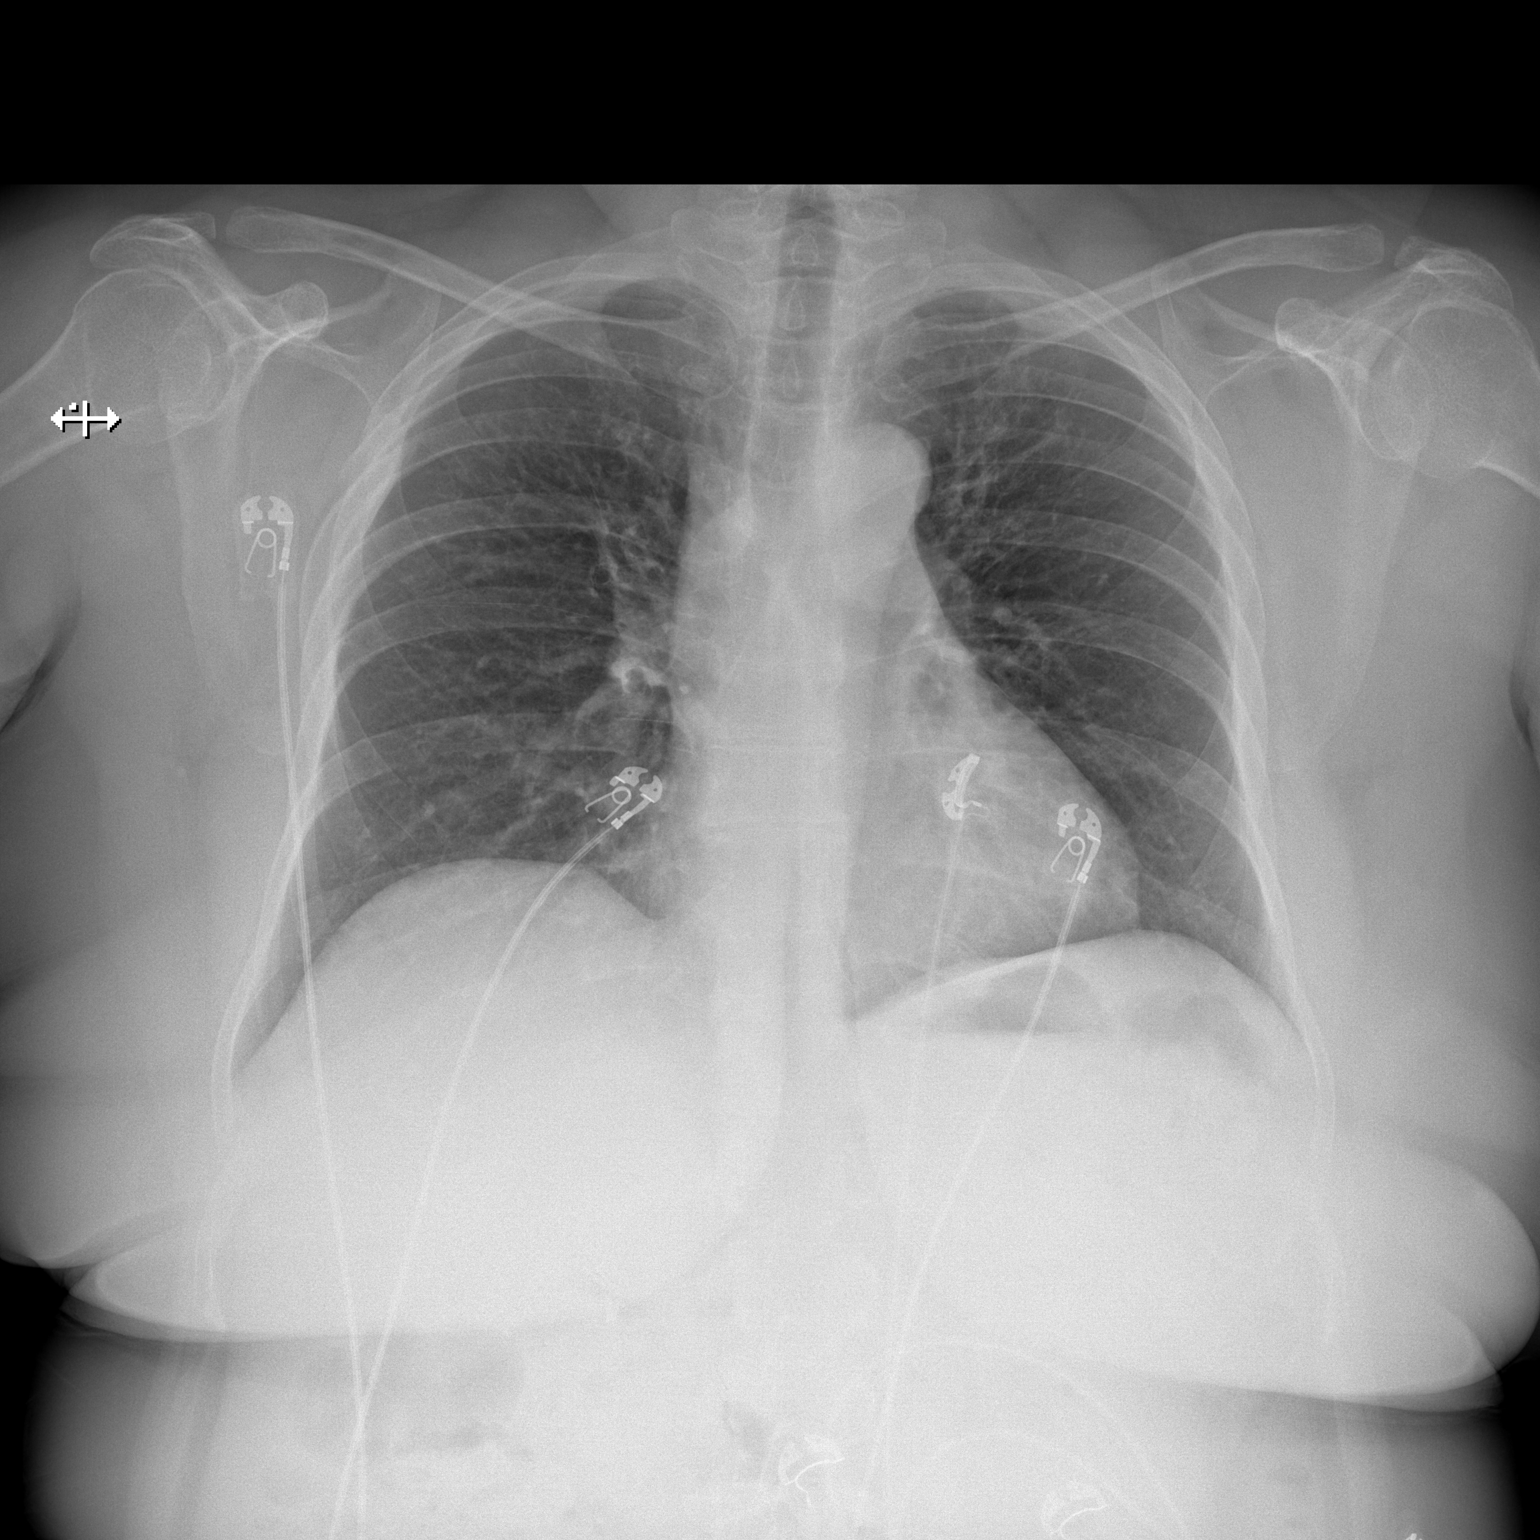

[w chest lat]
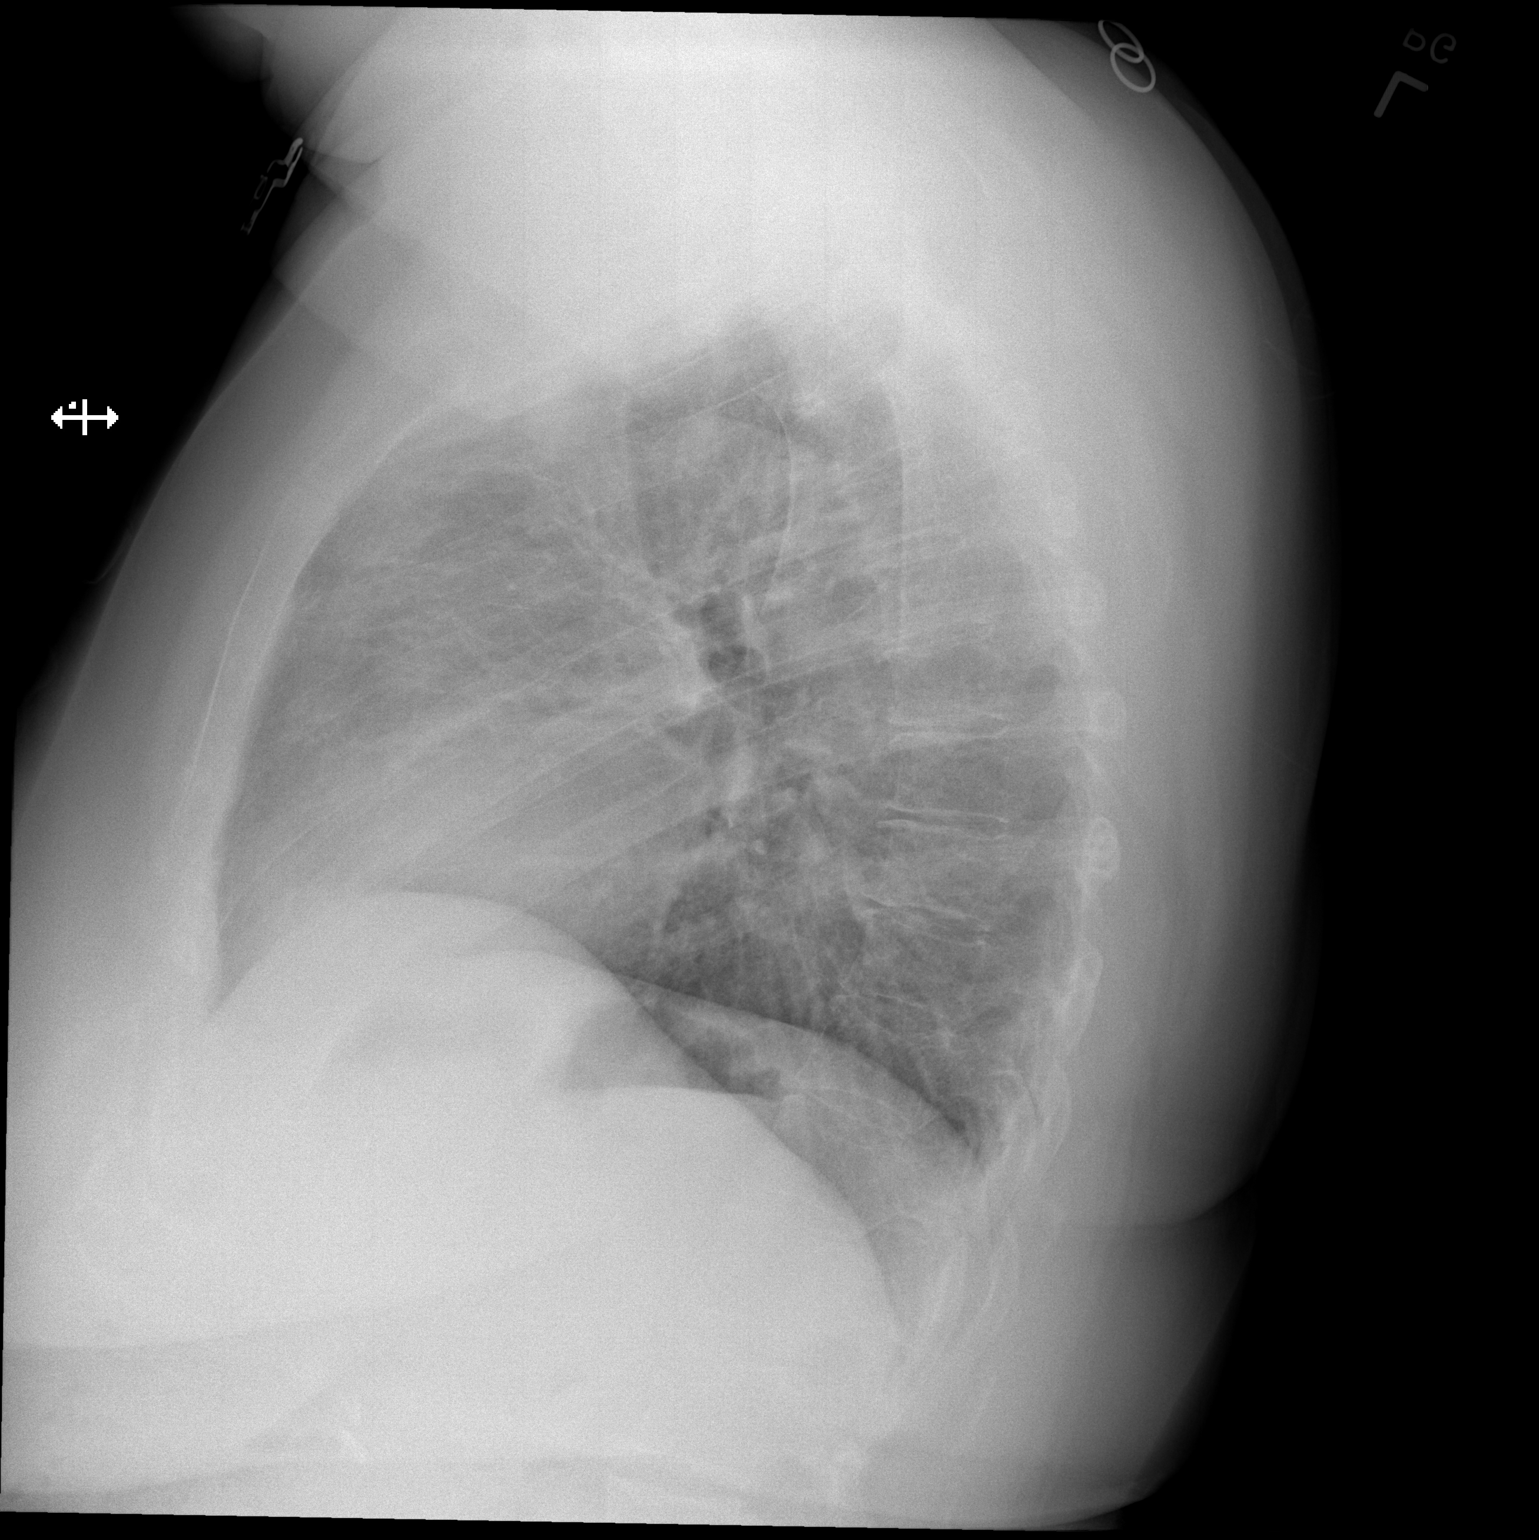

[2 of 2 positions shown; findings below may reference images not displayed]

FINDINGS: The heart size and mediastinal contours are within normal limits.
Both lungs are clear. The visualized skeletal structures are
unremarkable apart from mild thoracic kyphosis.
IMPRESSION: No active cardiopulmonary disease.  Stable chest.

## 2023-03-21 ENCOUNTER — Observation Stay (HOSPITAL_BASED_OUTPATIENT_CLINIC_OR_DEPARTMENT_OTHER): Payer: Self-pay

## 2023-03-21 ENCOUNTER — Emergency Department: Payer: Self-pay

## 2023-03-21 ENCOUNTER — Inpatient Hospital Stay
Admission: EM | Admit: 2023-03-21 | Discharge: 2023-04-07 | DRG: 694 | Disposition: A | Discharge status: Home / Self Care | Payer: Self-pay | Attending: Student in an Organized Health Care Education/Training Program | Admitting: Student in an Organized Health Care Education/Training Program

## 2023-03-21 ENCOUNTER — Observation Stay: Payer: Self-pay

## 2023-03-21 DIAGNOSIS — B3781 Candidal esophagitis: Secondary | ICD-10-CM | POA: Diagnosis present

## 2023-03-21 DIAGNOSIS — M51369 Other intervertebral disc degeneration, lumbar region without mention of lumbar back pain or lower extremity pain: Secondary | ICD-10-CM | POA: Diagnosis present

## 2023-03-21 DIAGNOSIS — K259 Gastric ulcer, unspecified as acute or chronic, without hemorrhage or perforation: Secondary | ICD-10-CM | POA: Diagnosis present

## 2023-03-21 DIAGNOSIS — G9341 Metabolic encephalopathy: Secondary | ICD-10-CM | POA: Diagnosis present

## 2023-03-21 DIAGNOSIS — F411 Generalized anxiety disorder: Secondary | ICD-10-CM | POA: Diagnosis present

## 2023-03-21 DIAGNOSIS — N281 Cyst of kidney, acquired: Secondary | ICD-10-CM | POA: Diagnosis present

## 2023-03-21 DIAGNOSIS — K760 Fatty (change of) liver, not elsewhere classified: Secondary | ICD-10-CM | POA: Diagnosis present

## 2023-03-21 DIAGNOSIS — J984 Other disorders of lung: Secondary | ICD-10-CM | POA: Diagnosis present

## 2023-03-21 DIAGNOSIS — J9601 Acute respiratory failure with hypoxia: Secondary | ICD-10-CM

## 2023-03-21 DIAGNOSIS — U099 Post covid-19 condition, unspecified: Secondary | ICD-10-CM | POA: Diagnosis present

## 2023-03-21 DIAGNOSIS — Z7984 Long term (current) use of oral hypoglycemic drugs: Secondary | ICD-10-CM

## 2023-03-21 DIAGNOSIS — F329 Major depressive disorder, single episode, unspecified: Secondary | ICD-10-CM | POA: Diagnosis present

## 2023-03-21 DIAGNOSIS — Z7982 Long term (current) use of aspirin: Secondary | ICD-10-CM

## 2023-03-21 DIAGNOSIS — J9602 Acute respiratory failure with hypercapnia: Secondary | ICD-10-CM

## 2023-03-21 DIAGNOSIS — J8283 Eosinophilic asthma: Secondary | ICD-10-CM | POA: Diagnosis present

## 2023-03-21 DIAGNOSIS — J9622 Acute and chronic respiratory failure with hypercapnia: Secondary | ICD-10-CM | POA: Diagnosis present

## 2023-03-21 DIAGNOSIS — K219 Gastro-esophageal reflux disease without esophagitis: Secondary | ICD-10-CM | POA: Diagnosis present

## 2023-03-21 DIAGNOSIS — G43909 Migraine, unspecified, not intractable, without status migrainosus: Secondary | ICD-10-CM | POA: Diagnosis present

## 2023-03-21 DIAGNOSIS — G8929 Other chronic pain: Secondary | ICD-10-CM | POA: Diagnosis present

## 2023-03-21 DIAGNOSIS — J9621 Acute and chronic respiratory failure with hypoxia: Secondary | ICD-10-CM | POA: Diagnosis present

## 2023-03-21 DIAGNOSIS — K2970 Gastritis, unspecified, without bleeding: Secondary | ICD-10-CM | POA: Diagnosis present

## 2023-03-21 DIAGNOSIS — R1084 Generalized abdominal pain: Secondary | ICD-10-CM

## 2023-03-21 DIAGNOSIS — D3A8 Other benign neuroendocrine tumors: Principal | ICD-10-CM | POA: Diagnosis present

## 2023-03-21 DIAGNOSIS — E66812 Obesity, class 2: Secondary | ICD-10-CM | POA: Diagnosis present

## 2023-03-21 DIAGNOSIS — Z79899 Other long term (current) drug therapy: Secondary | ICD-10-CM

## 2023-03-21 DIAGNOSIS — K5909 Other constipation: Secondary | ICD-10-CM | POA: Diagnosis present

## 2023-03-21 DIAGNOSIS — E1165 Type 2 diabetes mellitus with hyperglycemia: Secondary | ICD-10-CM | POA: Diagnosis present

## 2023-03-21 DIAGNOSIS — K869 Disease of pancreas, unspecified: Secondary | ICD-10-CM | POA: Insufficient documentation

## 2023-03-21 DIAGNOSIS — Z7951 Long term (current) use of inhaled steroids: Secondary | ICD-10-CM

## 2023-03-21 DIAGNOSIS — R079 Chest pain, unspecified: Secondary | ICD-10-CM

## 2023-03-21 DIAGNOSIS — J479 Bronchiectasis, uncomplicated: Secondary | ICD-10-CM | POA: Diagnosis present

## 2023-03-21 DIAGNOSIS — K8689 Other specified diseases of pancreas: Secondary | ICD-10-CM | POA: Diagnosis present

## 2023-03-21 DIAGNOSIS — E662 Morbid (severe) obesity with alveolar hypoventilation: Secondary | ICD-10-CM | POA: Diagnosis present

## 2023-03-21 DIAGNOSIS — R112 Nausea with vomiting, unspecified: Secondary | ICD-10-CM | POA: Insufficient documentation

## 2023-03-21 DIAGNOSIS — E8729 Other acidosis: Secondary | ICD-10-CM | POA: Diagnosis present

## 2023-03-21 DIAGNOSIS — Z6837 Body mass index (BMI) 37.0-37.9, adult: Secondary | ICD-10-CM

## 2023-03-21 LAB — COMPREHENSIVE METABOLIC PANEL
ALT: 25 U/L (ref <=55)
AST (SGOT): 31 U/L (ref <=41)
Albumin/Globulin Ratio: 1.2 (ref 0.9–2.2)
Albumin: 4.1 g/dL (ref 3.5–5.0)
Alkaline Phosphatase: 87 U/L (ref 37–117)
Anion Gap: 10 (ref 5.0–15.0)
BUN: 16 mg/dL (ref 7–21)
Bilirubin, Total: 0.5 mg/dL (ref 0.2–1.2)
CO2: 24 meq/L (ref 17–29)
Calcium: 9 mg/dL (ref 8.5–10.5)
Chloride: 107 meq/L (ref 99–111)
Creatinine: 0.6 mg/dL (ref 0.4–1.0)
GFR: >60 mL/min/{1.73_m2} (ref >=60.0)
Globulin: 3.3 g/dL (ref 2.0–3.6)
Glucose: 137 mg/dL — ABNORMAL HIGH (ref 70–100)
Potassium: 3.7 meq/L (ref 3.5–5.3)
Protein, Total: 7.4 g/dL (ref 6.0–8.3)
Sodium: 141 meq/L (ref 135–145)

## 2023-03-21 LAB — ECHO ADULT TTE COMPLETE
AV Mean Gradient: 5
AV Peak Velocity: 1.45
Ao Root Diameter (2D): 3.6
BP Mod LV Ejection Fraction: 63
IVS Diastolic Thickness (2D): 1
LA Dimension (2D): 3.6
LA Volume Index (BP A-L): 26.0639
LVID diastole (2D): 4.1
LVID systole (2D): 2.7
MV E/A: 0.7577
MV E/e' (Average): 12.427
Mitral Valve Findings: NORMAL
Prox Ascending Aorta Diameter: 2.7
Pulmonary Valve Findings: NORMAL
RV Basal Diastolic Dimension: 3.219
TAPSE: 2.38
Tricuspid Valve Findings: NORMAL

## 2023-03-21 LAB — ECG 12-LEAD
Atrial Rate: 104 {beats}/min
P Axis: 44 degrees
P-R Interval: 140 ms
Q-T Interval: 360 ms
QRS Duration: 100 ms
QTC Calculation (Bezet): 473 ms
R Axis: -5 degrees
T Axis: 33 degrees
Ventricular Rate: 104 {beats}/min

## 2023-03-21 LAB — ARTERIAL EG7 POCT
Arterial Base Excess POCT: 0 meq/L (ref <=3.0)
Arterial HCO3 Bicarbonate: 28.9 meq/L — ABNORMAL HIGH (ref 23.0–28.0)
Arterial O2 Saturation POCT: 94 % — ABNORMAL LOW (ref 95.0–98.0)
Arterial Total CO2: 31 meq/L — ABNORMAL HIGH (ref 23.0–29.0)
Arterial pCO2 POCT: 65.9 mmHg — ABNORMAL HIGH (ref 35.0–45.0)
Arterial pH POCT: 7.248 — ABNORMAL LOW (ref 7.350–7.450)
Arterial pO2 POCT: 84 mmHg (ref 80.0–105.0)
Calcium Ionized POCT: 2.4 meq/L — ABNORMAL LOW (ref 2.44–2.64)
FIO2: 4
Hematocrit POCT: 44 % — ABNORMAL HIGH (ref 34.7–43.7)
Hemoglobin POCT: 15 g/dL (ref 12.0–15.6)
Patient Temperature: 97.9
Potassium POCT: 4.4 meq/L (ref 3.5–4.9)
Sodium POCT: 144 meq/L (ref 136–146)

## 2023-03-21 LAB — LAB USE ONLY - CBC WITH DIFFERENTIAL
Absolute Basophils: 0.06 10*3/uL (ref 0.00–0.08)
Absolute Eosinophils: 0.39 10*3/uL (ref 0.00–0.44)
Absolute Immature Granulocytes: 0.09 10*3/uL — ABNORMAL HIGH (ref 0.00–0.07)
Absolute Lymphocytes: 0.99 10*3/uL (ref 0.42–3.22)
Absolute Monocytes: 1.18 10*3/uL — ABNORMAL HIGH (ref 0.21–0.85)
Absolute Neutrophils: 14.54 10*3/uL — ABNORMAL HIGH (ref 1.10–6.33)
Absolute nRBC: 0 10*3/uL (ref <=0.00)
Basophils %: 0.3 %
Eosinophils %: 2.3 %
Hematocrit: 46.4 % — ABNORMAL HIGH (ref 34.7–43.7)
Hemoglobin: 15.5 g/dL — ABNORMAL HIGH (ref 11.4–14.8)
Immature Granulocytes %: 0.5 %
Lymphocytes %: 5.7 %
MCH: 29.8 pg (ref 25.1–33.5)
MCHC: 33.4 g/dL (ref 31.5–35.8)
MCV: 89.2 fL (ref 78.0–96.0)
MPV: 9.6 fL (ref 8.9–12.5)
Monocytes %: 6.8 %
Neutrophils %: 84.4 %
Platelet Count: 343 10*3/uL (ref 142–346)
Preliminary Absolute Neutrophil Count: 14.54 10*3/uL — ABNORMAL HIGH (ref 1.10–6.33)
RBC: 5.2 10*6/uL — ABNORMAL HIGH (ref 3.90–5.10)
RDW: 12 % (ref 11–15)
WBC: 17.25 10*3/uL — ABNORMAL HIGH (ref 3.10–9.50)
nRBC %: 0 /100{WBCs} (ref <=0.0)

## 2023-03-21 LAB — SEDIMENTATION RATE: Sed Rate: 5 mm/h (ref <=20)

## 2023-03-21 LAB — VENOUS BLOOD GAS
Temperature: 36.6 C
Venous Base Excess: -3.7 meq/L — ABNORMAL LOW (ref <=5.3)
Venous HCO3: 26.6 meq/L (ref 23.8–32.4)
Venous O2 Saturation: 99.4 % — ABNORMAL HIGH (ref 18.6–94.8)
Venous PCO2: 72.4 mmHg — ABNORMAL HIGH (ref 39.0–63.0)
Venous PO2: 190 mmHg — ABNORMAL HIGH (ref 17.0–59.0)
Venous Total CO2: 28.8 meq/L (ref 25.8–34.1)
Venous pH: 7.187 — CL (ref 7.310–7.410)

## 2023-03-21 LAB — URINE DRUGS OF ABUSE SCREEN
Urine Amphetamine Screen: NEGATIVE
Urine Barbituate Screen: NEGATIVE
Urine Benzodiazepine Screen: NEGATIVE
Urine Cannabinoid Screen: NEGATIVE
Urine Cocaine Screen: NEGATIVE
Urine Fentanyl Screen: NEGATIVE
Urine Opiate Screen: POSITIVE — AB
Urine PCP Screen: NEGATIVE

## 2023-03-21 LAB — URINALYSIS WITH REFLEX TO MICROSCOPIC EXAM - REFLEX TO CULTURE
Urine Bilirubin: NEGATIVE
Urine Blood: NEGATIVE
Urine Glucose: NEGATIVE
Urine Ketones: NEGATIVE mg/dL
Urine Leukocyte Esterase: NEGATIVE
Urine Nitrite: NEGATIVE
Urine Protein: NEGATIVE
Urine Specific Gravity: 1.041 — ABNORMAL HIGH (ref 1.001–1.035)
Urine Urobilinogen: NORMAL mg/dL (ref 0.2–2.0)
Urine pH: 6.5 (ref 5.0–8.0)

## 2023-03-21 LAB — NT-PROBNP: NT-ProBNP: <15 pg/mL (ref <125)

## 2023-03-21 LAB — COVID-19 (SARS-COV-2) & INFLUENZA  A/B, NAA (ROCHE LIAT)
Influenza A RNA: NOT DETECTED
Influenza B RNA: NOT DETECTED
SARS-CoV-2 (COVID-19) RNA: NOT DETECTED

## 2023-03-21 LAB — WHOLE BLOOD GLUCOSE POCT
Whole Blood Glucose POCT: 107 mg/dL — ABNORMAL HIGH (ref 70–100)
Whole Blood Glucose POCT: 114 mg/dL — ABNORMAL HIGH (ref 70–100)
Whole Blood Glucose POCT: 121 mg/dL — ABNORMAL HIGH (ref 70–100)
Whole Blood Glucose POCT: 130 mg/dL — ABNORMAL HIGH (ref 70–100)

## 2023-03-21 LAB — C-REACTIVE PROTEIN: C-Reactive Protein: 0.4 mg/dL (ref 0.0–1.1)

## 2023-03-21 LAB — CREATINE KINASE (CK): Creatine Kinase (CK): 98 U/L (ref 29–233)

## 2023-03-21 LAB — HIGH SENSITIVITY TROPONIN-I: hs Troponin: <2.7 ng/L (ref <=14.0)

## 2023-03-21 LAB — LACTIC ACID
Whole Blood Lactic Acid: 2.2 mmol/L — ABNORMAL HIGH (ref 0.2–2.0)
Whole Blood Lactic Acid: 1.1 mmol/L (ref 0.2–2.0)

## 2023-03-21 LAB — ARTERIAL BLOOD GAS
Arterial Base Excess: -1.3 meq/L (ref <=2.7)
Arterial CO2: 28 meq/L (ref 23.0–30.0)
Arterial HCO3: 26.2 meq/L (ref 23.0–28.0)
Arterial O2 Saturation: 94.9 % (ref 94.0–98.0)
Arterial pCO2: 58.2 mmHg — ABNORMAL HIGH (ref 32.0–48.0)
Arterial pH: 7.276 — ABNORMAL LOW (ref 7.350–7.450)
Arterial pO2: 75.2 mmHg — ABNORMAL LOW (ref 83.0–108.0)
Temperature: 37.1 C

## 2023-03-21 LAB — ACETAMINOPHEN LEVEL: Acetaminophen Level: <7 ug/mL — ABNORMAL LOW (ref 10–30)

## 2023-03-21 LAB — LIPASE: Lipase: 26 U/L (ref 8–78)

## 2023-03-21 LAB — SALICYLATE LEVEL: Salicylate: <5 mg/dL — ABNORMAL LOW (ref 15.0–30.0)

## 2023-03-21 LAB — LAB USE ONLY - URINE GRAY CULTURE HOLD TUBE

## 2023-03-21 MED ORDER — MORPHINE SULFATE 4 MG/ML IJ/IV SOLN (WRAP)
4.0000 mg | Freq: Once | Status: AC
Start: 2023-03-21 — End: 2023-03-21
  Administered 2023-03-21: 4 mg via INTRAVENOUS
  Filled 2023-03-21: qty 1

## 2023-03-21 MED ORDER — ENOXAPARIN SODIUM 40 MG/0.4ML IJ SOSY
40.0000 mg | PREFILLED_SYRINGE | Freq: Every day | INTRAMUSCULAR | Status: DC
Start: 2023-03-21 — End: 2023-04-07
  Administered 2023-03-21 – 2023-04-05 (×6): 40 mg via SUBCUTANEOUS
  Filled 2023-03-21 (×14): qty 0.4

## 2023-03-21 MED ORDER — BUTALBITAL-APAP-CAFFEINE 50-325-40 MG PO TABS
1.0000 | ORAL_TABLET | Freq: Three times a day (TID) | ORAL | Status: DC | PRN
Start: 2023-03-21 — End: 2023-03-31
  Administered 2023-03-21 – 2023-03-30 (×3): 1 via ORAL
  Filled 2023-03-21 (×4): qty 1

## 2023-03-21 MED ORDER — POTASSIUM & SODIUM PHOSPHATES 280-160-250 MG PO PACK
2.0000 | PACK | ORAL | Status: DC | PRN
Start: 2023-03-21 — End: 2023-04-07

## 2023-03-21 MED ORDER — SODIUM CHLORIDE 0.9 % IV BOLUS
1000.0000 mL | Freq: Once | INTRAVENOUS | Status: AC
Start: 2023-03-21 — End: 2023-03-21
  Administered 2023-03-21: 1000 mL via INTRAVENOUS

## 2023-03-21 MED ORDER — BUDESONIDE-FORMOTEROL FUMARATE 160-4.5 MCG/ACT IN AERO
2.0000 | INHALATION_SPRAY | Freq: Two times a day (BID) | RESPIRATORY_TRACT | Status: AC
Start: 2023-03-21 — End: ?
  Administered 2023-03-22 – 2023-04-06 (×22): 2 via RESPIRATORY_TRACT
  Filled 2023-03-21 (×3): qty 6

## 2023-03-21 MED ORDER — MONTELUKAST SODIUM 10 MG PO TABS
10.0000 mg | ORAL_TABLET | Freq: Every evening | ORAL | Status: AC
Start: 2023-03-21 — End: ?
  Administered 2023-03-21 – 2023-04-06 (×17): 10 mg via ORAL
  Filled 2023-03-21 (×17): qty 1

## 2023-03-21 MED ORDER — GLUCAGON 1 MG IJ SOLR (WRAP)
1.0000 mg | INTRAMUSCULAR | Status: DC | PRN
Start: 2023-03-21 — End: 2023-03-21

## 2023-03-21 MED ORDER — DEXTROSE 10 % IV BOLUS
12.5000 g | INTRAVENOUS | Status: AC | PRN
Start: 2023-03-21 — End: ?

## 2023-03-21 MED ORDER — SALINE SPRAY 0.65 % NA SOLN
2.0000 | NASAL | Status: DC | PRN
Start: 2023-03-21 — End: 2023-04-07

## 2023-03-21 MED ORDER — HYDROMORPHONE HCL 1 MG/ML IJ SOLN
1.0000 mg | Freq: Once | INTRAMUSCULAR | Status: AC
Start: 2023-03-21 — End: 2023-03-21
  Administered 2023-03-21: 1 mg via INTRAVENOUS
  Filled 2023-03-21: qty 1

## 2023-03-21 MED ORDER — ONDANSETRON HCL 4 MG PO TABS
4.0000 mg | ORAL_TABLET | Freq: Three times a day (TID) | ORAL | Status: DC | PRN
Start: 2023-03-21 — End: 2023-03-29
  Administered 2023-03-26 – 2023-03-28 (×3): 4 mg via ORAL
  Filled 2023-03-21 (×4): qty 1

## 2023-03-21 MED ORDER — DEXTROSE 50 % IV SOLN
12.5000 g | INTRAVENOUS | Status: DC | PRN
Start: 2023-03-21 — End: 2023-04-07

## 2023-03-21 MED ORDER — GLUCOSE 40 % PO GEL (WRAP)
15.0000 g | ORAL | Status: AC | PRN
Start: 2023-03-21 — End: ?

## 2023-03-21 MED ORDER — VANCOMYCIN HCL IN NACL 1.5-0.9 GM/500ML-% IV SOLN
20.0000 mg/kg | Freq: Once | INTRAVENOUS | Status: AC
Start: 2023-03-21 — End: 2023-03-21
  Administered 2023-03-21: 1500 mg via INTRAVENOUS
  Filled 2023-03-21: qty 500

## 2023-03-21 MED ORDER — MELATONIN 3 MG PO TABS
3.0000 mg | ORAL_TABLET | Freq: Every evening | ORAL | Status: DC | PRN
Start: 2023-03-21 — End: 2023-04-03
  Administered 2023-03-29: 3 mg via ORAL
  Filled 2023-03-21: qty 1

## 2023-03-21 MED ORDER — BENZOCAINE-MENTHOL MT LOZG (WRAP)
1.0000 | LOZENGE | OROMUCOSAL | Status: DC | PRN
Start: 2023-03-21 — End: 2023-04-07
  Administered 2023-03-24 – 2023-04-07 (×8): 1 via BUCCAL
  Filled 2023-03-21 (×8): qty 1

## 2023-03-21 MED ORDER — NALOXONE HCL 0.4 MG/ML IJ SOLN (WRAP)
0.2000 mg | INTRAMUSCULAR | Status: AC | PRN
Start: 2023-03-21 — End: ?

## 2023-03-21 MED ORDER — ONDANSETRON HCL 4 MG/2ML IJ SOLN
4.0000 mg | Freq: Once | INTRAMUSCULAR | Status: AC
Start: 2023-03-21 — End: 2023-03-21
  Administered 2023-03-21: 4 mg via INTRAVENOUS
  Filled 2023-03-21: qty 2

## 2023-03-21 MED ORDER — FAMOTIDINE 20 MG PO TABS
10.0000 mg | ORAL_TABLET | Freq: Two times a day (BID) | ORAL | Status: DC
Start: 2023-03-21 — End: 2023-03-22
  Administered 2023-03-21: 10 mg via ORAL
  Filled 2023-03-21 (×3): qty 1

## 2023-03-21 MED ORDER — IOHEXOL 350 MG/ML IV SOLN
100.0000 mL | Freq: Once | INTRAVENOUS | Status: AC | PRN
Start: 2023-03-21 — End: 2023-03-21
  Administered 2023-03-21: 100 mL via INTRAVENOUS

## 2023-03-21 MED ORDER — ACETAMINOPHEN 325 MG PO TABS
650.0000 mg | ORAL_TABLET | ORAL | Status: DC | PRN
Start: 2023-03-21 — End: 2023-03-22
  Administered 2023-03-21: 650 mg via ORAL
  Filled 2023-03-21: qty 2

## 2023-03-21 MED ORDER — BENZONATATE 100 MG PO CAPS
200.0000 mg | ORAL_CAPSULE | Freq: Three times a day (TID) | ORAL | Status: AC | PRN
Start: 2023-03-21 — End: ?
  Administered 2023-03-25 – 2023-04-04 (×7): 200 mg via ORAL
  Filled 2023-03-21 (×7): qty 2

## 2023-03-21 MED ORDER — ALBUTEROL SULFATE (2.5 MG/3ML) 0.083% IN NEBU
2.5000 mg | INHALATION_SOLUTION | Freq: Four times a day (QID) | RESPIRATORY_TRACT | Status: AC | PRN
Start: 2023-03-21 — End: ?
  Administered 2023-03-22 – 2023-03-24 (×3): 2.5 mg via RESPIRATORY_TRACT
  Filled 2023-03-21 (×3): qty 3

## 2023-03-21 MED ORDER — POTASSIUM CHLORIDE 10 MEQ/100ML IV SOLN (WRAP)
10.0000 meq | INTRAVENOUS | Status: AC | PRN
Start: 2023-03-21 — End: ?

## 2023-03-21 MED ORDER — CARBOXYMETHYLCELLULOSE SOD PF 0.5 % OP SOLN
1.0000 [drp] | Freq: Three times a day (TID) | OPHTHALMIC | Status: AC | PRN
Start: 2023-03-21 — End: ?

## 2023-03-21 MED ORDER — STERILE WATER FOR INJECTION IJ/IV SOLN (WRAP)
4.5000 g | Freq: Once | INTRAVENOUS | Status: AC
Start: 2023-03-21 — End: 2023-03-21
  Administered 2023-03-21: 4.5 g via INTRAVENOUS
  Filled 2023-03-21: qty 20

## 2023-03-21 MED ORDER — BENZONATATE 100 MG PO CAPS
100.0000 mg | ORAL_CAPSULE | Freq: Three times a day (TID) | ORAL | Status: DC | PRN
Start: 2023-03-21 — End: 2023-03-21

## 2023-03-21 MED ORDER — POTASSIUM CHLORIDE 20 MEQ PO PACK
0.0000 meq | PACK | ORAL | Status: AC | PRN
Start: 2023-03-21 — End: ?
  Administered 2023-04-05: 40 meq via ORAL
  Filled 2023-03-21: qty 2
  Filled 2023-03-21: qty 3

## 2023-03-21 MED ORDER — DEXTROSE 50 % IV SOLN
12.5000 g | INTRAVENOUS | Status: DC | PRN
Start: 2023-03-21 — End: 2023-03-21

## 2023-03-21 MED ORDER — ONDANSETRON HCL 4 MG/2ML IJ SOLN
4.0000 mg | Freq: Three times a day (TID) | INTRAMUSCULAR | Status: DC | PRN
Start: 2023-03-21 — End: 2023-03-29
  Administered 2023-03-21 – 2023-03-29 (×10): 4 mg via INTRAVENOUS
  Filled 2023-03-21 (×10): qty 2

## 2023-03-21 MED ORDER — DEXTROSE 10 % IV BOLUS
12.5000 g | INTRAVENOUS | Status: DC | PRN
Start: 2023-03-21 — End: 2023-03-21

## 2023-03-21 MED ORDER — GLUCAGON 1 MG IJ SOLR (WRAP)
1.0000 mg | INTRAMUSCULAR | Status: AC | PRN
Start: 2023-03-21 — End: ?

## 2023-03-21 MED ORDER — KETOROLAC TROMETHAMINE 30 MG/ML IJ SOLN
30.0000 mg | Freq: Once | INTRAMUSCULAR | Status: AC
Start: 2023-03-22 — End: 2023-03-21
  Administered 2023-03-21: 30 mg via INTRAVENOUS
  Filled 2023-03-21: qty 1

## 2023-03-21 MED ORDER — GLUCOSE 40 % PO GEL (WRAP)
15.0000 g | ORAL | Status: DC | PRN
Start: 2023-03-21 — End: 2023-03-21

## 2023-03-21 MED ORDER — INSULIN LISPRO 100 UNIT/ML SOLN (WRAP)
1.0000 [IU] | Status: DC
Start: 2023-03-21 — End: 2023-03-22

## 2023-03-21 MED ORDER — POTASSIUM CHLORIDE CRYS ER 20 MEQ PO TBCR
0.0000 meq | EXTENDED_RELEASE_TABLET | ORAL | Status: AC | PRN
Start: 2023-03-21 — End: ?
  Administered 2023-03-22: 60 meq via ORAL
  Administered 2023-03-23: 20 meq via ORAL
  Administered 2023-03-25: 40 meq via ORAL
  Administered 2023-04-04: 20 meq via ORAL
  Administered 2023-04-06 – 2023-04-07 (×2): 60 meq via ORAL
  Filled 2023-03-21 (×2): qty 1
  Filled 2023-03-21: qty 3
  Filled 2023-03-21: qty 1
  Filled 2023-03-21 (×2): qty 2
  Filled 2023-03-21: qty 3

## 2023-03-21 MED ORDER — MAGNESIUM SULFATE IN D5W 1-5 GM/100ML-% IV SOLN
1.0000 g | INTRAVENOUS | Status: DC | PRN
Start: 2023-03-21 — End: 2023-04-07
  Administered 2023-03-23 (×2): 1 g via INTRAVENOUS
  Filled 2023-03-21 (×2): qty 100

## 2023-03-21 MED ORDER — ASPIRIN 81 MG PO TBEC
81.0000 mg | DELAYED_RELEASE_TABLET | Freq: Every day | ORAL | Status: DC
Start: 2023-03-21 — End: 2023-04-06
  Administered 2023-03-21 – 2023-04-05 (×13): 81 mg via ORAL
  Filled 2023-03-21 (×15): qty 1

## 2023-03-21 MED ORDER — SCOPOLAMINE 1 MG/3DAYS TD PT72
1.0000 | MEDICATED_PATCH | TRANSDERMAL | Status: AC
Start: 2023-03-21 — End: ?
  Administered 2023-03-21 – 2023-04-02 (×5): 1 via TRANSDERMAL
  Filled 2023-03-21 (×7): qty 1

## 2023-03-21 MED ORDER — PANTOPRAZOLE SODIUM 40 MG PO TBEC
40.0000 mg | DELAYED_RELEASE_TABLET | Freq: Every morning | ORAL | Status: DC
Start: 2023-03-21 — End: 2023-03-22
  Administered 2023-03-21: 40 mg via ORAL
  Filled 2023-03-21 (×2): qty 1

## 2023-03-21 MED ORDER — ACETAMINOPHEN 325 MG PO TABS
650.0000 mg | ORAL_TABLET | Freq: Four times a day (QID) | ORAL | Status: DC | PRN
Start: 2023-03-21 — End: 2023-03-22

## 2023-03-21 MED ORDER — CETIRIZINE HCL 10 MG PO TABS
10.0000 mg | ORAL_TABLET | Freq: Every day | ORAL | Status: AC
Start: 2023-03-21 — End: ?
  Administered 2023-03-21 – 2023-04-05 (×13): 10 mg via ORAL
  Filled 2023-03-21 (×16): qty 1

## 2023-03-21 MED ORDER — SODIUM CHLORIDE 0.9 % IV SOLN
INTRAVENOUS | Status: AC
Start: 2023-03-21 — End: 2023-03-22

## 2023-03-21 NOTE — Nursing Progress Note (Addendum)
 Received upgrade transfer to ICCU room 110. Report received from [place], [place]. Vitals stable. Placed on cardiac telemetry monitoring, tachycardic. On 2L O2 per NC. Pt nauseous and dry heaving, PRN IV Zofran  administered. RT notified of pt's arrival to unit and asked to initiated nocturnal CPAP orders.     2 RN skin assessment completed w/ Luke, RN  -grossly intact, scattered tattoos
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
## 2023-03-21 NOTE — Plan of Care (Signed)
Problem: Pain interferes with ability to perform ADL  Goal: Pain at adequate level as identified by patient  Outcome: Progressing     Problem: Side Effects from Pain Analgesia  Goal: Patient will experience minimal side effects of analgesic therapy  Outcome: Progressing     Problem: Day of Admission - Heart Failure  Goal: Heart Failure Admission  Outcome: Progressing     Problem: Everyday - Heart Failure  Goal: Stable Vital Signs and Fluid Balance  Outcome: Progressing  Goal: Mobility/Activity is Maintained at Optimal Level for Patient  Outcome: Progressing  Goal: Nutritional Intake is Adequate  Outcome: Progressing  Goal: Teaching-Using CHF Warning Zones and Educational Videos  Outcome: Progressing     Problem: Day of Discharge - Heart Failure  Goal: Discharge Education  Outcome: Progressing

## 2023-03-21 NOTE — Significant Event (Signed)
 Toradol ordered for chronic HA

## 2023-03-21 NOTE — ED to IP RN Note (Addendum)
 [facility] EMERGENCY DEPT  ED NURSING NOTE FOR THE RECEIVING INPATIENT NURSE   ED NURSE Tamee Battin   [facility] [id number]   ED CHARGE RN zoe   ADMISSION INFORMATION   Valerie May is a 53 y.o. female admitted with an ED diagnosis of:    1. Generalized abdominal pain         Isolation: None   Allergies: Benadryl  [diphenhydramine ], Dust mite extract, Methocarbamol, Molds & smuts, Other, and Compazine [prochlorperazine]   Holding Orders confirmed? No   Belongings Documented? No   Home medications sent to pharmacy confirmed? No   NURSING CARE   Patient Comes From:   Mental Status: Home Independent  oriented and lethargic   ADL: Independent with all ADLs   Ambulation: mild difficulty   Pertinent Information  and Safety Concerns:     Broset Violence Risk Level: Low Pt came into ED for CP, atraumatic R arm pain , SOB x 1 month. States has been medicating at home for pain w/ol relief. Pt found to be 90% RA via EMS, pt on initially 3LNC titrated up to oximask placed by RT. Pt given total 8mg  morphine , 2mg  dilaudid , vanc, 1L NS bolus in ED. CT chest clear. CXR clear. WBC elevated, CK/ESR pending in lab. Admit for pain and oxygen requirement. Pt currently arousable to stimulation, still able to answer questions, drowsy after second dose of dilaudid .     CT / NIH   CT Head ordered on this patient?  No   NIH/Dysphagia assessment done prior to admission? No   VITAL SIGNS (at the time of this note)      Vitals:    03/21/23 0918   BP: 136/84   Pulse: (!) 111   Resp: 20   Temp:    SpO2: 93%     Pain Score: 10-severe pain (03/21/23 0829)

## 2023-03-21 NOTE — Progress Notes (Signed)
 NURSING ADMISSION NOTE     Date Time: 03/21/2023  Patient Name: Valerie May  Attending Physician: Dr. Azalee     Date of Admission:   03/21/2023     Reason for Admission:   Generalized abdominal pain     Admitted from:   ED, independent at home     Nursing Note:   Patient Aox4. English speaking, and VSS. Oriented patient to the floor and POC. Bed to low position. Call bell within reach.      Patient scores as low/mod/high: low. Patient ambulates indepedently.  Bed alarm on and floor mats in place. Patient belongings reviewed. Patient reports pain as 10/10 in upper chest, PRN Dilaudid  given in ED prior to encounter, and feeling nauseous, PRN Zofran  given. Safety interventions in place.     4 eyes in 4 hours pressure injury assessment note:       Completed with: [place], RN  Unit & Time admitted: PSB2, 1000                                                                                                     Bony Prominences: Check appropriate box; if wound is present enter wound assessment in LDA              Occiput:                 [x] WNL  []  Wound present  Face:                     [x] WNL  []  Wound present  Ears:                      [x] WNL         []  Wound present  Spine:                    [x] WNL         []  Wound present  Shoulders:             [x] WNL         []  Wound present  Elbows:                  [x] WNL         []  Wound present  Sacrum/coccyx:     [x] WNL         []  Wound present  Ischial Tuberosity:  [x] WNL        []  Wound present  Trochanter/Hip:      [x] WNL       []  Wound present  Knees:                   [x] WNL         []  Wound present  Ankles:                   [x] WNL        []  Wound present  Heels:                    [  x]WNL         []  Wound present  Other pressure areas:      []  Wound location        Device related: []  Device name:           LDA completed if wound present: yes/no  Consult WOCN if necessary     Other skin related issues, ie tears, rash, etc, document in Integumentary flowsheet

## 2023-03-21 NOTE — Progress Notes (Addendum)
 Adult Observation Progress Note    Shift Note:    General: Patient VSS  Neuro: Patient is AOx4. Patient denies numbness or tingling. Perrla. Drowsy but arousable.   Cardio: SR on telemetry, verified with [facility]. S1, S2 auscultated.   Resp: Patient on 2L NC with lung sounds rhonchi bilaterally on auscultation. Tachypneic, use accessory muscle to breath,   Integ: Skin intact, no wounds or edema noted.   MSK: Patient has not ambulated yet. low/mod/high: Low falls risk.   GI: Bowel sounds auscultated all 4 quadrants. Pt endorses N/V, PRN IV Zofran  given.  GU: Patient is continent of urine. On external cath.  IV Access: IV: 20g  in: R forearm. NS @ 142ml/h. 20g in LAC.      BM during shift: YES/NO: no    Plan:  Transferring  Avoid narcotics  Monitor VS  NPO  IVF -- NS at 100ml/h    1700 -- report given to receiving RN Katrina. All questions and concerns answered.    1800 -- Rounding on pt. Pt is asleep. Holding off PO meds for now.      Discharge Plan: Tbd, pending medical clearance    Social/Family Visits: n/a    POC update: pt updated with POC during bedside report      Vitals:    03/21/23 1400 03/21/23 1405 03/21/23 1557 03/21/23 1720   BP:   125/79    Pulse: 99 (!) 102 (!) 102    Resp:   18    Temp:   98.8 F (37.1 C)    TempSrc:   Oral    SpO2: 93% 92% 93%    Weight:    80.3 kg (177 lb)   Height:    1.499 m (4' 11)       Patient Lines/Drains/Airways Status       Active Lines, Drains and Airways       Name Placement date Placement time Site Days    Peripheral IV 03/21/23 20 G Standard Anterior;Right Forearm 03/21/23  0509  Forearm  less than 1    Peripheral IV 03/21/23 20 G Diffusion Left Antecubital 03/21/23  0625  Antecubital  less than 1

## 2023-03-21 NOTE — Consults (Signed)
 [facility]  MCCS Consult Note    Date Time: 03/21/23 3:23 PM  Admit Date: 03/21/2023 3:23 PM  Patient Name: Valerie May  Attending Physician: Jucan, Ioana, MD  Primary Care Physician: Michiel Olam RAMAN, FNP  Location/Room: E788/E788.98   Hospital Day #: 0    MCCS Consult Requested by: Dr. Jucan     Primary Reason for ICU Evaluation:      Altered MS     HPI:      (Copied from prior)  53 y.o. female with a history of asthma, eosinophilia, mild bronchiectasis and recurrent PNA/bronchitis triggered by an episode of COVID-19 pneumonia in 2021, sinus disease (s/p sinus surgery), GERD, type II diabetes mellitus, polycythemia, migraines, anxiety, lumbar spondylosis and obesity, who presented to the hospital with intractable nausea and emesis since last night as well as chest, abdomen and shoulder pain. RRT called for respiratory acidosis, likely due to a combination of poor mental status/drowsiness in the setting of IV opiate use and untreated OSA. She improved during examination and requires no acute interventions at this time.         Assessment and Plan     Assessment:  Acute hypercarbic respiratory failure due to OSA and PRN narcotics, improved    Plan:  Avoid narcotics  CPAP qhs  Plan discussed with Dr. Jucan  At this time, patient does not require MCCSU/ICU level of care. If patient's clinical condition changes, please re-consult MCCS    Code Status: full code    ---------------------------------------------------------------------------------------------------------------------  This patient has a high probability of sudden clinically significant deterioration which requires the highest level of physician preparedness to intervene urgently. I managed/supervised life or organ supporting interventions that required frequent physician assessments. I devoted my full attention in the ICU to the direct care of this patient for this period of time. See above assessment/plan for organ systems which are failing and  require intensive, critical care support. Any critical care time is exclusive of teaching, billable procedures, and not overlapping with any other providers.     Critical Care Time: 49 minutes    Eartha JAYSON Rives, MD  MCCS Critical Care Attending    cc:Hsu, Olam RAMAN, FNP May

## 2023-03-21 NOTE — ED Provider Notes (Signed)
 EMERGENCY DEPARTMENT HISTORY AND PHYSICAL EXAM    Date: 03/21/23  Patient Name: Valerie May  Attending Physician: Belvie Batter, MD      Disposition and Treatment Plan    Clinical Impression:   1. Generalized abdominal pain      Disposition:   ED Disposition       ED Disposition   Observation    Condition   --    Date/Time   Sat Mar 21, 2023  9:12 AM    Comment   Admitting Physician: JUCAN, IOANA [[id number]]   Service:: Medicine [[id number]]   Estimated Length of Stay: < 2 midnights   Tentative Discharge Plan?: Home or Self Care [1]   Does patient need telemetry?: Yes   Is patient 18 yrs or greater?: Yes   Telemetry type (separate Telemetry order is also required):: Adult telemetry   Was admission to another facility considered?: No   Detail:: [facility] resource required                      History of Presenting Illness     Chief Complaint:   Chief Complaint   Patient presents with   . Chest Pain     sob   . Shortness of Breath   . Flu like symptoms     Valerie May is a 53 y.o. female who  has a past medical history of COVID-19, COVID-19 (2021), Heart valve problem, Kidney stone, and Pneumonia. c/o multiple concerns - has been sick with flu-like symptoms and pneumonia for 1 week. Her most concerning symptoms on exam are: diffuse abdominal pain onset @ 1 AM w/ vomiting (emesis is yellow/green), chest pain (R>L), right wrist/arm pain. Has been taking Methocarbamol/Tylenol  for pain management w/ minimal relief. No dysuria. EMS put her on 4L NC en route.   History of abdominal surgeries: hernia repair, tubal ligation, hysterectomy, gallbladder surgery  Medication allergy: Compazine   This history was obtained from EMS and patient.     PCP: Michiel Olam RAMAN, FNP      Past Medical History     Past Medical History:   Diagnosis Date   . COVID-19    . COVID-19 2021   . Heart valve problem    . Kidney stone    . Pneumonia        Past Surgical History[1]    Family History     Family History[2]    Social History     Social History[3]      Allergies     Allergies[4]    Medications     Current Medications[5]    Review of Systems     Pertinent Positives and Negatives noted in the HPI.  All Other Systems Reviewed and Negative: Yes    Physical Exam   Physical Exam   Nursing note and vitals reviewed.  Constitutional: A&Ox3, appears uncomfortable  Head: NCAT   ENT: Oropharynx is clear, MMM  Eyes: Conjunctivae normal  Neck: Normal range of motion, neck supple, no JVD present  Cardiovascular: Tachycardic, regular rhythm, no M/G/R  Pulmonary/Chest: CTAB, no respiratory distress  Abdominal: Soft, diffuse abdominal tenderness, ND  Musculoskeletal: Diffuse TTP to RUE, 2+ radial, cap refill <2 sec  Neurological: A&Ox3  Skin: Skin is warm and dry  Psychiatric: Pt has a normal mood and affect, behavior is normal     Diagnostic Study Results     Labs -  Labs Reviewed   COMPREHENSIVE METABOLIC PANEL - Abnormal; Notable for the  following components:       Result Value    Glucose 137 (*)     All other components within normal limits   URINALYSIS WITH REFLEX TO MICROSCOPIC EXAM - REFLEX TO CULTURE - Abnormal; Notable for the following components:    Urine Specific Gravity 1.041 (*)     All other components within normal limits    Narrative:     Urine Microscopic not indicated   LACTIC ACID - Abnormal; Notable for the following components:    Whole Blood Lactic Acid 2.2 (*)     All other components within normal limits   LAB USE ONLY - CBC WITH DIFFERENTIAL - Abnormal; Notable for the following components:    WBC 17.25 (*)     Hemoglobin 15.5 (*)     Hematocrit 46.4 (*)     RBC 5.20 (*)     Preliminary Absolute Neutrophil Count 14.54 (*)     Absolute Neutrophils 14.54 (*)     Absolute Monocytes 1.18 (*)     Absolute Immature Granulocytes 0.09 (*)     All other components within normal limits   ACETAMINOPHEN  LEVEL - Abnormal; Notable for the following components:    Acetaminophen  Level <7 (*)     All other components within normal limits   SALICYLATE LEVEL - Abnormal;  Notable for the following components:    Salicylate <5.0 (*)     All other components within normal limits   VENOUS BLOOD GAS - Abnormal; Notable for the following components:    Venous pH 7.187 (*)     Venous PCO2 72.4 (*)     Venous PO2 190.0 (*)     Venous Base Excess -3.7 (*)     Venous O2 Saturation 99.4 (*)     All other components within normal limits   URINE DRUGS OF ABUSE SCREEN - Abnormal; Notable for the following components:    Urine Opiate Screen Positive (*)     All other components within normal limits    Narrative:     Testing performed on [employer] instrumentation.                                    Samples with a reaction greater than or equal to the following                  cutoff levels yield a presumptively Positive result:                                    Amphetamines           1000 ng/mL                  Barbiturates            200 ng/mL                  Benzodiazepines         200 ng/mL                  Cannabinoids (THC)       50 ng/mL                  Cocaine                 300 ng/ml  Fentanyl                 1.0 ng/mL                     Opiates                 300 ng/mL                  PCP                      25 ng/mL                                    Drug Screens are unconfirmed and are for medical use only.                   Call Lab within 1 week if GCMS confirmation is required.                  If drug screen is negative and drug/medication use is still                   suspected, order a blood and urine toxicology screen.                                     The Opiates screen is primarily for detection of morphine ,                   heroin and codeine . Consumption of poppy seeds may produce a                   positive opiates screening result. The THC screen does not                   detect synthetic cannabinoid-like compounds.   ARTERIAL BLOOD GAS - Abnormal; Notable for the following components:    Arterial pH 7.276 (*)     Arterial pCO2 58.2 (*)      Arterial pO2 75.2 (*)     All other components within normal limits   WHOLE BLOOD GLUCOSE POCT - Abnormal; Notable for the following components:    Whole Blood Glucose POCT 130 (*)     All other components within normal limits   ARTERIAL EG7 POCT - Abnormal; Notable for the following components:    Arterial pH POCT 7.248 (*)     Arterial pCO2 POCT 65.9 (*)     Arterial HCO3 Bicarbonate 28.9 (*)     Arterial Total CO2 31.0 (*)     Arterial O2 Saturation POCT 94.0 (*)     Calcium  Ionized POCT 2.40 (*)     Hematocrit POCT 44.0 (*)     All other components within normal limits   WHOLE BLOOD GLUCOSE POCT - Abnormal; Notable for the following components:    Whole Blood Glucose POCT 121 (*)     All other components within normal limits   WHOLE BLOOD GLUCOSE POCT - Abnormal; Notable for the following components:    Whole Blood Glucose POCT 107 (*)     All other components within normal limits   COVID-19 (SARS-COV-2) & INFLUENZA  A/B, NAA (ROCHE LIAT) - Normal    Narrative:     A result of Detected indicates POSITIVE for  the presence of viral RNA                  A result of Not Detected indicates NEGATIVE for the presence of viral RNA                                    Test performed using the Roche cobas Liat SARS-CoV-2 & Influenza A/B assay. This is a multiplex real-time RT-PCR assay for the detection of SARS-CoV-2, influenza A, and influenza B virus RNA. Viral nucleic acids may persist in vivo, independent of viability. Detection of viral nucleic acid does not imply the presence of infectious virus, or that virus nucleic acid is the cause of clinical symptoms. Negative results do not preclude SARS-CoV-2, influenza A, and/or influenza B infection and should not be used as the sole basis for diagnosis, treatment or other patient management decisions. Invalid results may be due to inhibiting substances in the specimen and recollection should occur.    HIGH SENSITIVITY TROPONIN-I - Normal   LIPASE - Normal   NT-PROBNP -  Normal   LACTIC ACID - Normal   CREATINE KINASE (CK) - Normal   C-REACTIVE PROTEIN - Normal   SEDIMENTATION RATE - Normal   CULTURE BLOOD AEROBIC AND ANAEROBIC   CBC AND DIFFERENTIAL    Narrative:     The following orders were created for panel order CBC with Differential (Order).                  Procedure                               Abnormality         Status                                     ---------                               -----------         ------                                     CBC with Differential (...[[id number]]  Abnormal            Final result                                                 Please view results for these tests on the individual orders.   LAB USE ONLY - URINE GRAY CULTURE HOLD TUBE       Radiologic Studies -   Echo Adult TTE Complete   Final Result      Shoulder Right 2+ Views   Final Result      1. No fracture of the shoulder.      2. Old healed fractures of the right lateral third and fourth ribs.      Derick Clap, MD   03/21/2023 2:09 PM      CT Abd/Pelvis with IV Contrast only  Final Result      1.No acute abnormality within the chest, abdomen, or pelvis.   2.No findings of acute pulmonary embolism within the limitations stated   above.   3.Hyperdense pancreatic head lesion. Further evaluation with MRI/MRCP is   recommended.   4.Mildly hyperdense right renal lesion, possible hemorrhagic/proteinaceous   cyst. This can also be further evaluated on MRI.   5.Additional chronic findings as above.      Sue CHRISTELLA Fila, DO   03/21/2023 8:51 AM      CT Angio Chest (PE study)   Final Result      1.No acute abnormality within the chest, abdomen, or pelvis.   2.No findings of acute pulmonary embolism within the limitations stated   above.   3.Hyperdense pancreatic head lesion. Further evaluation with MRI/MRCP is   recommended.   4.Mildly hyperdense right renal lesion, possible hemorrhagic/proteinaceous   cyst. This can also be further evaluated on MRI.   5.Additional chronic  findings as above.      Sue CHRISTELLA Fila, DO   03/21/2023 8:51 AM      Chest AP Portable   Final Result      No acute pulmonary process.      Odella Edelson, MD   03/21/2023 5:31 AM            Clinical Course in the Emergency Department     7:00 AM  Pt signed out to Dr. Alvia pending CT's.  Will require admission afterwards.     Medical Decision Making   I am the first provider for this patient.  I reviewed the vital signs, nursing notes, past medical history, past surgical history, family history and social history.  I have reviewed the patient's previous charts.    53 y/o female with h/o kidney stones here with diffuse abd pain, N/V.  Also with CP, SOB.  Hypoxic on RA.  - Labs  - EKG  - CT CAP  - IVF  - Admit    Vital Signs - BP 134/63   Pulse (!) 108   Temp 97.6 F (36.4 C) (Oral)   Resp (!) 24   Ht 4' 11 (1.499 m)   Wt 80.3 kg   LMP 03/18/2012   SpO2 94%   BMI 35.75 kg/m    Pulse Oximetry Analysis - Abnormal, improved with 2L NC  Laboratory results reviewed and interpreted by EDP: Yes  Radiologic study results reviewed by EDP: Yes  Radiologic Studies Interpreted (viewed) by EDP: Yes      EKG Analysis reviewed and interpreted by me: sinus tachy, LVH, HR 104, inferior W waves    Cardiac Monitor interpretation: Sinus tachycardia, HR 110    Critical Care Time(not including procedures): 40 minutes.  Due to the high risk of critical illness or multi-organ failure at initial presentation and/or during ED course.   System(s) at risk for compromise:  respiratory  Critical Diagnosis:   1. Generalized abdominal pain       The patient was Hypotensive:   No    The patient was Hypoxic:   Yes  This does not including time spent performing other reported procedures or services.  Critical care time involved full attention to the patient's condition and included:   Review of nursing notes and/or old charts - Yes  Documentation time - Yes  Care, transfer of care, and discharge plans - Yes  Obtaining necessary history from  family, EMS, nursing home staff and/or treating physicians - Yes  Review of  medications, allergies, and vital signs - Yes   Consultant collaboration on findings and treatment options - Yes  Ordering, interpreting, and reviewing diagnostic studies/tab tests - Yes         Diagnosis and Treatment Plan       _______________________________    Attestations:  I was acting as a scribe for Belvie Batter, MD on [place] [facility]  Treatment Team: Scribe: Tamela Chalmers HERO     I am the first provider for this patient and I personally performed the services documented. Treatment Team: Scribe: Tamela Chalmers HERO is scribing for me on Valerie May,Valerie May. This note accurately reflects work and decisions made by me.  Belvie Batter, MD      _______________________________             Batter Belvie HERO, MD  03/21/23 2207         [1]  Past Surgical History:  Procedure Laterality Date   . CYST REMOVAL     . GALLBLADDER SURGERY     . HERNIA REPAIR     . HYSTERECTOMY     . TUBAL LIGATION     [2]  Family History  Problem Relation Name Age of Onset   . Heart attack Mother     . Diabetes Father     . Heart attack Maternal Grandfather     . Heart attack Paternal Grandfather     . Stroke Son     . Heart attack Maternal Uncle     . Stomach cancer Cousin     . Bone cancer Maternal Aunt     . Colon cancer Neg Hx     . Breast cancer Neg Hx     [3]  Social History  Socioeconomic History   . Marital status: Single   Tobacco Use   . Smoking status: Never     Passive exposure: Current   . Smokeless tobacco: Never   Vaping Use   . Vaping status: Never Used   Substance and Sexual Activity   . Alcohol  use: Not Currently   . Drug use: Never   . Sexual activity: Not Currently     Social Drivers of Health     Financial Resource Strain: High Risk (03/27/2021)    Overall Financial Resource Strain (CARDIA)    . Difficulty of Paying Living Expenses: Very hard   Food Insecurity: No Food Insecurity (03/21/2023)    Hunger Vital Sign    . Worried About Patent Examiner in the Last Year: Never true    . Ran Out of Food in the Last Year: Never true   Transportation Needs: No Transportation Needs (03/21/2023)    PRAPARE - Transportation    . Lack of Transportation (Medical): No    . Lack of Transportation (Non-Medical): No   Intimate Partner Violence: Not At Risk (03/21/2023)    Humiliation, Afraid, Rape, and Kick questionnaire    . Fear of Current or Ex-Partner: No    . Emotionally Abused: No    . Physically Abused: No    . Sexually Abused: No   Housing Stability: Unknown (03/21/2023)    Housing Stability Vital Sign    . Unable to Pay for Housing in the Last Year: No    . Homeless in the Last Year: No   [4]  Allergies  Allergen Reactions   . Benadryl  [Diphenhydramine ]    . Dust Mite Extract    . Methocarbamol Itching   . Molds & Smuts    .  Other    . Compazine [Prochlorperazine] Anxiety   [5]    Current Facility-Administered Medications:   .  0.9% NaCl infusion, , Intravenous, Continuous, Jucan, Ioana, MD, Last Rate: 100 mL/hr at 03/21/23 1530, New Bag at 03/21/23 1530  .  acetaminophen  (TYLENOL ) tablet 650 mg, 650 mg, Oral, Q4H PRN, Jucan, Ioana, MD, 650 mg at 03/21/23 1821  .  acetaminophen  (TYLENOL ) tablet 650 mg, 650 mg, Oral, Q6H PRN, Hummel, Ellie D, MD  .  albuterol  (PROVENTIL ) (2.5 MG/3ML) 0.083% nebulizer solution 2.5 mg, 2.5 mg, Nebulization, Q6H PRN, Jucan, Ioana, MD  .  aspirin  EC tablet 81 mg, 81 mg, Oral, Daily, Jucan, Ioana, MD, 81 mg at 03/21/23 2145  .  benzocaine -menthol  (CEPACOL/CHLORASEPTIC) lozenge 1 lozenge, 1 lozenge, Buccal, Q2H PRN, Jucan, Ioana, MD  .  benzonatate  (TESSALON ) capsule 200 mg, 200 mg, Oral, TID PRN, Jucan, Ioana, MD  .  budesonide -formoterol  (SYMBICORT ) 160-4.5 MCG/ACT inhaler 2 puff, 2 puff, Inhalation, BID, Jucan, Ioana, MD  .  butalbital -acetaminophen -caffeine  (FIORICET ) 50-325-40 MG per tablet 1 tablet, 1 tablet, Oral, Q8H PRN, Jucan, Ioana, MD, 1 tablet at 03/21/23 2145  .  carboxymethylcellulose (PF) (REFRESH PLUS) 0.5 % ophthalmic  solution 1 drop, 1 drop, Both Eyes, TID PRN, Jucan, Ioana, MD  .  cetirizine  (ZyrTEC ) tablet 10 mg, 10 mg, Oral, Daily, Jucan, Ioana, MD, 10 mg at 03/21/23 2145  .  dextrose  (GLUCOSE) 40 % oral gel 15 g of glucose, 15 g of glucose, Oral, PRN **OR** dextrose  (D10W) 10% bolus 125 mL, 12.5 g, Intravenous, PRN **OR** dextrose  50 % bolus 12.5 g, 12.5 g, Intravenous, PRN **OR** glucagon  (rDNA) (GLUCAGEN) injection 1 mg, 1 mg, Intramuscular, PRN, Jucan, Ioana, MD  .  enoxaparin  (LOVENOX ) syringe 40 mg, 40 mg, Subcutaneous, Daily, Jucan, Ioana, MD, 40 mg at 03/21/23 2145  .  famotidine  (PEPCID ) tablet 10 mg, 10 mg, Oral, BID, Jucan, Ioana, MD, 10 mg at 03/21/23 2145  .  insulin  lispro injection 1-5 Units, 1-5 Units, Subcutaneous, Q4H [facility] **AND** NSG Communication: Glucose POCT order, , , Q4H SCH, Jucan, Ioana, MD  .  magnesium  sulfate 1g in dextrose  5% IVPB (premix), 1 g, Intravenous, PRN, Jucan, Ioana, MD  .  melatonin tablet 3 mg, 3 mg, Oral, QHS PRN, Jucan, Ioana, MD  .  montelukast  (SINGULAIR ) tablet 10 mg, 10 mg, Oral, QHS, Jucan, Ioana, MD, 10 mg at 03/21/23 2145  .  naloxone  (NARCAN ) injection 0.2 mg, 0.2 mg, Intravenous, PRN, Jucan, Ioana, MD  .  ondansetron  (ZOFRAN ) tablet 4 mg, 4 mg, Oral, Q8H PRN **OR** ondansetron  (ZOFRAN ) injection 4 mg, 4 mg, Intravenous, Q8H PRN, Jucan, Ioana, MD, 4 mg at 03/21/23 1908  .  pantoprazole  (PROTONIX ) EC tablet 40 mg, 40 mg, Oral, QAM AC, Jucan, Ioana, MD, 40 mg at 03/21/23 2145  .  potassium & sodium phosphates  (PHOS-NAK) 280-160-250 MG packet 2 packet, 2 packet, Oral, PRN, Jucan, Ioana, MD  .  potassium chloride  (KLOR-CON  M20) CR tablet 0-60 mEq, 0-60 mEq, Oral, PRN **OR** potassium chloride  (KLOR-CON ) packet 0-60 mEq, 0-60 mEq, Oral, PRN **OR** potassium chloride  10 mEq in 100 mL IVPB (premix), 10 mEq, Intravenous, PRN, Jucan, Ioana, MD  .  saline (OCEAN NASAL SPRAY) 0.65 % nasal solution 2 spray, 2 spray, Each Nare, Q4H PRN, Jucan, Ioana, MD  .  scopolamine   (TRANSDERM-SCOP) patch 1 mg/72 hrs 1 patch, 1 patch, Transdermal, Q72H, Jucan, Ioana, MD, 1 patch at 03/21/23 1908

## 2023-03-21 NOTE — Significant Event (Addendum)
 HOUSE MD RAPID RESPONSE NOTE    Date Time: 03/21/23 2:25 PM  Patient Name: Valerie May  Attending Physician: Jucan, Ioana, MD      Reason for Rapid Response:   Hypercarbia/respiratory acidosis     Assessment:   53 y.o. female with a history of asthma, eosinophilia, mild bronchiectasis and recurrent PNA/bronchitis triggered by an episode of COVID-19 pneumonia in 2021, sinus disease (s/p sinus surgery), GERD, type II diabetes mellitus, polycythemia, migraines, anxiety, lumbar spondylosis and obesity, who presented to the hospital with intractable nausea and emesis since last night as well as chest, abdomen and shoulder pain. RRT called for respiratory acidosis, likely due to a combination of poor mental status/drowsiness in the setting of IV opiate use and untreated OSA. She improved during examination and requires no acute interventions at this time.     Plan:   Avoid further medications that may cause respiratory depression - would optimize non-opiate pain medication   Recheck ABG in 1-2 hours   Recommend nightly CPAP for OSA and prn while sleeping   Recommend patient move to higher acuity unit for closer monitoring than observation unit   RRT will re-evaluate patient following repeat ABG   At this time, recommend she be NPO until mental status improves and is stable   Would likely benefit from gentle hydration while NPO given significant vomiting    Case discussed with: Dr. Jucan (internal medicine, primary team), Dr. Jodean ([facility], consult team)    Outcome: Patient to remain no floor but should move to higher acuity unit    Primary attending was notified.    Subjective:   Significant Events: Drowsiness with acute hypercapnia     HPI: 53 y.o. female with a history of asthma, eosinophilia, mild bronchiectasis and recurrent PNA/bronchitis triggered by an episode of COVID-19 pneumonia in 2021, sinus disease (s/p sinus surgery), GERD, type II diabetes mellitus, polycythemia, migraines, anxiety, lumbar spondylosis  and obesity, who presented to the hospital with intractable nausea and emesis since last night as well as chest, abdomen and shoulder pain. She met SIRS criteria on admission, received 2L normal saline, empiric antibiotics, and received a total of 8mg  morphine  and 2mg  dilaudid  intravenously for pain. When she was evaluated earlier, she was found to be quite drowsy by her primary team so they ordered a VBG which was notable for pH 7.19 with a pCO2 in the 70s. RRT was called for respiratory acidosis.    At time of evaluation at 1315, patient was found to be drowsy and loudly snoring while receiving a bedside echocardiogram. She was laying flat and sleeping, but was easily arousable to voice and would wake up and answer questions appropriately. She was reporting significant pain, mostly in her chest/back/arm/abdomen, but was not reporting any difficulty breathing or shortness of breath. The RRT proceeded to sit her up in bed and talk with her more, at which point her breathing appeared normal and she was able to converse easily without any shortness of breath. She was reporting feeling dehydrated and wanting to drink some fluids. Reports she has been throwing up a lot at home and therefore has not been able to drink many fluids. We obtained an ABG at bedside after she was sitting up and more alert, at which time her pH was 7.25 and pCO2 was 66. She reports that she was diagnosed with likely OSA but has never had any formal testing done to verify this diagnosis - she does not use a CPAP machine at home. She  has PFTs that are c/w moderate restrictive lung disease.     Physical Exam:     Vitals:    03/21/23 1405   BP:    Pulse: (!) 102   Resp:    Temp:    SpO2: 92%       No intake or output data in the 24 hours ending 03/21/23 1425    General: Drowsy-appearing female, in no acute distress, uncomfortable appearing, laying in bed     Cardiovascular: mild tachycardia with HR low 100s, no appreciable murmurs/rubs/gallops, no  JVD, 2+ DP/PT pulses bilaterally, no peripheral edema  Lungs: Lungs are clear to auscultation bilaterally with no crackles/rhonchi/wheezing. Good aeration bilaterally. While sleeping, has loud snoring and breathing appears more labored.   Abdomen: soft, tender to palpation though non-distended with no rebound/guarding. Unable to appreciate any hepatosplenomegaly or masses, though limited by body habitus, +bowel sounds   Extremities: warm and well perfused bilaterally with no peripheral edema, no gross abnormalities appreciable on exam    Meds:     Current Facility-Administered Medications   Medication Dose Route Frequency    aspirin  EC  81 mg Oral Daily    budesonide -formoterol   2 puff Inhalation BID    cetirizine   10 mg Oral Daily    enoxaparin   40 mg Subcutaneous Daily    famotidine   10 mg Oral BID    insulin  lispro  1-5 Units Subcutaneous Q4H SCH    montelukast   10 mg Oral QHS    pantoprazole   40 mg Oral QAM AC       Labs:     Results       Procedure Component Value Units Date/Time    Arterial EG7 POCT [[id number]]  (Abnormal) Collected: 03/21/23 1336    Specimen: Blood, Arterial Updated: 03/21/23 1412     Arterial pH POCT 7.248     Arterial pCO2 POCT 65.9 mmHg      Arterial pO2 POCT 84.0 mmHg      Arterial HCO3 Bicarbonate 28.9 mEq/L      Arterial Total CO2 31.0 mEq/L      Arterial Base Excess POCT 0.0 mEq/L      Arterial O2 Saturation POCT 94.0 %      Calcium  Ionized POCT 2.40 mEq/L      Sodium POCT 144 mEq/L      Potassium POCT 4.4 mEq/L      Hemoglobin POCT 15.0 g/dL      Hematocrit POCT 55.9 %      Patient Temperature 97.9 F     FIO2 4     Allen's Test POCT Pass     Doctor Notified --     Draw Site R Radial    Urine Drugs of Abuse Screen [[id number]]  (Abnormal) Collected: 03/21/23 1242    Specimen: Urine, Clean Catch Updated: 03/21/23 1320     Urine Amphetamine Screen Negative     Urine Barbituate Screen Negative     Urine Benzodiazepine Screen Negative     Urine Cannabinoid Screen Negative     Urine  Cocaine Screen Negative     Urine Fentanyl  Screen Negative     Urine Opiate Screen Positive     Urine PCP Screen Negative    Narrative:      Testing performed on [employer] instrumentation.    Samples with a reaction greater than or equal to the following  cutoff levels yield a presumptively Positive result:    Amphetamines  1000 ng/mL  Barbiturates            200 ng/mL  Benzodiazepines         200 ng/mL  Cannabinoids (THC)       50 ng/mL  Cocaine                 300 ng/ml  Fentanyl                 1.0 ng/mL     Opiates                 300 ng/mL  PCP                      25 ng/mL    Drug Screens are unconfirmed and are for medical use only.   Call Lab within 1 week if GCMS confirmation is required.  If drug screen is negative and drug/medication use is still   suspected, order a blood and urine toxicology screen.     The Opiates screen is primarily for detection of morphine ,   heroin and codeine . Consumption of poppy seeds may produce a   positive opiates screening result. The THC screen does not   detect synthetic cannabinoid-like compounds.    Whole Blood Glucose POCT [[id number]]  (Abnormal) Collected: 03/21/23 1314    Specimen: Blood, Capillary Updated: 03/21/23 1316     Whole Blood Glucose POCT 130 mg/dL     Venous Blood Gas [[id number]]  (Abnormal) Collected: 03/21/23 1231    Specimen: Blood, Venous Updated: 03/21/23 1246     Venous pH 7.187     Venous PCO2 72.4 mmHg      Venous PO2 190.0 mmHg      Venous HCO3 26.6 mEq/L      Venous Total CO2 28.8 mEq/L      Venous Base Excess -3.7 mEq/L      Venous O2 Saturation 99.4 %      Temperature 36.6 C      Collection Site Venous    Sedimentation Rate (ESR) [[id number]]  (Normal) Collected: 03/21/23 0944    Specimen: Blood, Venous Updated: 03/21/23 1000     Sed Rate 5 mm/Hr     Creatine Kinase (CK) [[id number]]  (Normal) Collected: 03/21/23 0703    Specimen: Blood, Venous Updated: 03/21/23 0952     Creatine Kinase (CK) 98 U/L     C Reactive Protein [[id number]]   (Normal) Collected: 03/21/23 0703    Specimen: Blood, Venous Updated: 03/21/23 0952     C-Reactive Protein 0.4 mg/dL     Urinalysis with Reflex to Microscopic Exam and Culture [[id number]]  (Abnormal) Collected: 03/21/23 0908    Specimen: Urine, Clean Catch Updated: 03/21/23 0937     Urine Color Straw     Urine Clarity Clear     Urine Specific Gravity 1.041     Urine pH 6.5     Urine Leukocyte Esterase Negative     Urine Nitrite Negative     Urine Protein Negative     Urine Glucose Negative     Urine Ketones Negative mg/dL      Urine Urobilinogen Normal mg/dL      Urine Bilirubin Negative     Urine Blood Negative    Narrative:      Urine Microscopic not indicated    Urine [employer] Tube [[id number]] Collected: 03/21/23 0908    Specimen: Urine, Clean Catch Updated: 03/21/23 0913    Acetaminophen  Level [[id number]]  (Abnormal)  Collected: 03/21/23 0703    Specimen: Blood, Venous Updated: 03/21/23 0738     Acetaminophen  Level <7 ug/mL     Salicylate Level [[id number]]  (Abnormal) Collected: 03/21/23 0703    Specimen: Blood, Venous Updated: 03/21/23 0737     Salicylate <5.0 mg/dL     Lactic Acid [[id number]]  (Normal) Collected: 03/21/23 0703    Specimen: Blood, Venous Updated: 03/21/23 0714     Whole Blood Lactic Acid 1.1 mmol/L     NT-ProBNP [[id number]]  (Normal) Collected: 03/21/23 0506    Specimen: Blood, Venous Updated: 03/21/23 0648     NT-ProBNP <15 pg/mL     COVID-19 and Influenza (L[place]) (symptomatic) [[id number]]  (Normal) Collected: 03/21/23 0532    Specimen: Swab from Anterior Nares Updated: 03/21/23 0646     SARS-CoV-2 (COVID-19) RNA Not Detected     Influenza A RNA Not Detected     Influenza B RNA Not Detected    Narrative:      A result of Detected indicates POSITIVE for the presence of viral RNA  A result of Not Detected indicates NEGATIVE for the presence of viral RNA    Test performed using the Roche cobas Liat SARS-CoV-2 & Influenza A/B assay. This is a multiplex real-time RT-PCR assay for  the detection of SARS-CoV-2, influenza A, and influenza B virus RNA. Viral nucleic acids may persist in vivo, independent of viability. Detection of viral nucleic acid does not imply the presence of infectious virus, or that virus nucleic acid is the cause of clinical symptoms. Negative results do not preclude SARS-CoV-2, influenza A, and/or influenza B infection and should not be used as the sole basis for diagnosis, treatment or other patient management decisions. Invalid results may be due to inhibiting substances in the specimen and recollection should occur.     Comprehensive Metabolic Panel [[id number]]  (Abnormal) Collected: 03/21/23 0506    Specimen: Blood, Venous Updated: 03/21/23 0644     Glucose 137 mg/dL      BUN 16 mg/dL      Creatinine 0.6 mg/dL      Sodium 858 mEq/L      Potassium 3.7 mEq/L      Chloride 107 mEq/L      CO2 24 mEq/L      Calcium  9.0 mg/dL      Anion Gap 89.9     GFR >60.0 mL/min/1.73 m2      AST (SGOT) 31 U/L      ALT 25 U/L      Alkaline Phosphatase 87 U/L      Albumin 4.1 g/dL      Protein, Total 7.4 g/dL      Globulin 3.3 g/dL      Albumin/Globulin Ratio 1.2     Bilirubin, Total 0.5 mg/dL     High Sensitivity Troponin-I [[id number]]  (Normal) Collected: 03/21/23 0506    Specimen: Blood, Venous Updated: 03/21/23 0644     hs Troponin <2.7 ng/L     Lipase [[id number]]  (Normal) Collected: 03/21/23 0506    Specimen: Blood, Venous Updated: 03/21/23 0644     Lipase 26 U/L     CBC with Differential (Order) [[id number]]  (Abnormal) Collected: 03/21/23 0506    Specimen: Blood, Venous Updated: 03/21/23 9370    Narrative:      The following orders were created for panel order CBC with Differential (Order).  Procedure  Abnormality         Status                     ---------                               -----------         ------                     CBC with Differential (...[[id number]]  Abnormal            Final result                 Please view results for  these tests on the individual orders.    CBC with Differential (Component) [[id number]]  (Abnormal) Collected: 03/21/23 0506    Specimen: Blood, Venous Updated: 03/21/23 0629     WBC 17.25 x10 3/uL      Hemoglobin 15.5 g/dL      Hematocrit 53.5 %      Platelet Count 343 x10 3/uL      MPV 9.6 fL      RBC 5.20 x10 6/uL      MCV 89.2 fL      MCH 29.8 pg      MCHC 33.4 g/dL      RDW 12 %      nRBC % 0.0 /100 WBC      Absolute nRBC 0.00 x10 3/uL      Preliminary Absolute Neutrophil Count 14.54 x10 3/uL      Neutrophils % 84.4 %      Lymphocytes % 5.7 %      Monocytes % 6.8 %      Eosinophils % 2.3 %      Basophils % 0.3 %      Immature Granulocytes % 0.5 %      Absolute Neutrophils 14.54 x10 3/uL      Absolute Lymphocytes 0.99 x10 3/uL      Absolute Monocytes 1.18 x10 3/uL      Absolute Eosinophils 0.39 x10 3/uL      Absolute Basophils 0.06 x10 3/uL      Absolute Immature Granulocytes 0.09 x10 3/uL     Culture, Blood, Aerobic And Anaerobic [[id number]] Collected: 03/21/23 0506    Specimen: Blood, Venous Updated: 03/21/23 0611    Lactic Acid [[id number]]  (Abnormal) Collected: 03/21/23 0506    Specimen: Blood, Venous Updated: 03/21/23 0515     Whole Blood Lactic Acid 2.2 mmol/L             Signed by: Ronita JONETTA Garbe, MD  Internal Medicine Resident  Pager # [phone]  Spectra  # [phone]

## 2023-03-21 NOTE — ED Triage Notes (Signed)
 Please see quick triage.

## 2023-03-21 NOTE — Progress Notes (Signed)
 1246 -- Lab called and notified pt's critical value. Venous blood gas pH 7.187. Dr. Jucan was notified immediately via secure chat. MD placed an order to initiate BiPAP. Transfer order was requested by MD.   1300 -- RRT was called d/t critical labs value. Pt A&Ox4 drowsy but arousable. BP 127/71 HR 106 O2 98% on 4L of NC. BG 130.   -- ABG performed at bedside was 7.25.   1430 -- VSS. O2 at 2L NC, sat at 92%. A&Ox4.

## 2023-03-21 NOTE — ED Notes (Signed)
 Bed: 106  Expected date: 03/21/23  Expected time: 4:53 AM  Means of arrival: [id number]  Comments:

## 2023-03-21 NOTE — Plan of Care (Incomplete)
 Attending Attestation:     I have seen and personally examined the patient.  I agree with the history, exam, assessment, and management plans as documented by Dr. Marland Kitchen  I concur with or have edited all elements of the provider's note, with any caveats as follows    53 yo F admitted 03/21/23 with intractable vomiting as ewll as abdominal pain. Labs on admission notable for: WBC 17.25, H/H 15.5/46.4, hs trop <2.7, lipase 26, lactate 2.2, proBNP <15, negative COVID/flu swab, CRP 0.4, UA negative for infection, ESR 5. CXR showed no acute process. CT CAP showed no acute abnormality, no PE, pancreatic head lesion, right renal lesion (possibly cyst). Patient was tachycardic, tachypnea, and hypoxic and placed on 5L O2 NC. This patient was then admitted to observation. Blood cultures were collected. Soon after admission, rapid response was called for sedation/hypoxia, thought to need upgrade and bipap. [facility] evaluated patient - no indication for upgrade.       # Acute hypoxemic respiratory failure    # +SIRS (tachycardia, tachypnea, leukocytosis)      # Polycythemia  # Lactic acidosis   # Pancreatic head lesion, incidentally seen (not seen on 09/2022 CT)  # Right renal lesion, incidentally seen  # History of nephrolithiasis    # NIDDM2, A1c ***  # Asthma  # COVID19, 2021  # GERD  # History of migraines  # Obesity, BMI 35.7  # Vitamin D deficiency  # GERD    Plan:  ***      Disposition:     Today's date: 03/21/2023  Admit Date: 03/21/2023  4:55 AM  Clinical Milestones: ***  Anticipated discharge needs: ***    Jerral Ralph, MD

## 2023-03-22 ENCOUNTER — Inpatient Hospital Stay: Payer: Self-pay

## 2023-03-22 LAB — WHOLE BLOOD GLUCOSE POCT
Whole Blood Glucose POCT: 101 mg/dL — ABNORMAL HIGH (ref 70–100)
Whole Blood Glucose POCT: 98 mg/dL (ref 70–100)
Whole Blood Glucose POCT: 98 mg/dL (ref 70–100)
Whole Blood Glucose POCT: 94 mg/dL (ref 70–100)
Whole Blood Glucose POCT: 98 mg/dL (ref 70–100)

## 2023-03-22 LAB — LAB USE ONLY - CBC WITH DIFFERENTIAL
Absolute Basophils: 0.02 10*3/uL (ref 0.00–0.08)
Absolute Eosinophils: 0.13 10*3/uL (ref 0.00–0.44)
Absolute Immature Granulocytes: 0.03 10*3/uL (ref 0.00–0.07)
Absolute Lymphocytes: 0.84 10*3/uL (ref 0.42–3.22)
Absolute Monocytes: 0.46 10*3/uL (ref 0.21–0.85)
Absolute Neutrophils: 5.17 10*3/uL (ref 1.10–6.33)
Absolute nRBC: 0 10*3/uL (ref <=0.00)
Basophils %: 0.3 %
Eosinophils %: 2 %
Hematocrit: 42.3 % (ref 34.7–43.7)
Hemoglobin: 13.3 g/dL (ref 11.4–14.8)
Immature Granulocytes %: 0.5 %
Lymphocytes %: 12.6 %
MCH: 29.4 pg (ref 25.1–33.5)
MCHC: 31.4 g/dL — ABNORMAL LOW (ref 31.5–35.8)
MCV: 93.6 fL (ref 78.0–96.0)
MPV: 9.8 fL (ref 8.9–12.5)
Monocytes %: 6.9 %
Neutrophils %: 77.7 %
Platelet Count: 282 10*3/uL (ref 142–346)
Preliminary Absolute Neutrophil Count: 5.17 10*3/uL (ref 1.10–6.33)
RBC: 4.52 10*6/uL (ref 3.90–5.10)
RDW: 13 % (ref 11–15)
WBC: 6.65 10*3/uL (ref 3.10–9.50)
nRBC %: 0 /100{WBCs} (ref <=0.0)

## 2023-03-22 LAB — COMPREHENSIVE METABOLIC PANEL
ALT: 141 U/L — ABNORMAL HIGH (ref <=55)
AST (SGOT): 98 U/L — ABNORMAL HIGH (ref <=41)
Albumin/Globulin Ratio: 1.2 (ref 0.9–2.2)
Albumin: 3.4 g/dL — ABNORMAL LOW (ref 3.5–5.0)
Alkaline Phosphatase: 82 U/L (ref 37–117)
Anion Gap: 5 (ref 5.0–15.0)
BUN: 9 mg/dL (ref 7–21)
Bilirubin, Total: 0.5 mg/dL (ref 0.2–1.2)
CO2: 29 meq/L (ref 17–29)
Calcium: 8.5 mg/dL (ref 8.5–10.5)
Chloride: 107 meq/L (ref 99–111)
Creatinine: 0.5 mg/dL (ref 0.4–1.0)
GFR: >60 mL/min/{1.73_m2} (ref >=60.0)
Globulin: 2.8 g/dL (ref 2.0–3.6)
Glucose: 111 mg/dL — ABNORMAL HIGH (ref 70–100)
Potassium: 3.4 meq/L — ABNORMAL LOW (ref 3.5–5.3)
Protein, Total: 6.2 g/dL (ref 6.0–8.3)
Sodium: 141 meq/L (ref 135–145)

## 2023-03-22 LAB — HEPATIC FUNCTION PANEL (LFT)
ALT: 120 U/L — ABNORMAL HIGH (ref <=55)
AST (SGOT): 75 U/L — ABNORMAL HIGH (ref <=41)
Albumin/Globulin Ratio: 1.1 (ref 0.9–2.2)
Albumin: 3.2 g/dL — ABNORMAL LOW (ref 3.5–5.0)
Alkaline Phosphatase: 82 U/L (ref 37–117)
Bilirubin Direct: 0.2 mg/dL (ref 0.0–0.5)
Bilirubin Indirect: 0.3 mg/dL (ref 0.2–1.0)
Bilirubin, Total: 0.5 mg/dL (ref 0.2–1.2)
Globulin: 2.9 g/dL (ref 2.0–3.6)
Protein, Total: 6.1 g/dL (ref 6.0–8.3)

## 2023-03-22 LAB — CBC
Absolute nRBC: 0 10*3/uL (ref <=0.00)
Hematocrit: 40.5 % (ref 34.7–43.7)
Hemoglobin: 12.9 g/dL (ref 11.4–14.8)
MCH: 29.6 pg (ref 25.1–33.5)
MCHC: 31.9 g/dL (ref 31.5–35.8)
MCV: 92.9 fL (ref 78.0–96.0)
MPV: 9.4 fL (ref 8.9–12.5)
Platelet Count: 260 10*3/uL (ref 142–346)
RBC: 4.36 10*6/uL (ref 3.90–5.10)
RDW: 12 % (ref 11–15)
WBC: 5.94 10*3/uL (ref 3.10–9.50)
nRBC %: 0 /100{WBCs} (ref <=0.0)

## 2023-03-22 LAB — HEPATITIS PANEL, ACUTE
Hepatitis A Antibody, IgM: NONREACTIVE
Hepatitis B Core IgM: NONREACTIVE
Hepatitis B Surface Antigen: NONREACTIVE
Hepatitis C Antibody: NONREACTIVE

## 2023-03-22 LAB — PT AND APTT
INR: 1 (ref 0.9–1.1)
PT: 11.8 s (ref 10.1–12.9)
PTT: 30 s (ref 27–39)

## 2023-03-22 LAB — BASIC METABOLIC PANEL
Anion Gap: 4 — ABNORMAL LOW (ref 5.0–15.0)
BUN: 8 mg/dL (ref 7–21)
CO2: 30 meq/L — ABNORMAL HIGH (ref 17–29)
Calcium: 8.4 mg/dL — ABNORMAL LOW (ref 8.5–10.5)
Chloride: 104 meq/L (ref 99–111)
Creatinine: 0.6 mg/dL (ref 0.4–1.0)
GFR: >60 mL/min/{1.73_m2} (ref >=60.0)
Glucose: 95 mg/dL (ref 70–100)
Potassium: 3.7 meq/L (ref 3.5–5.3)
Sodium: 138 meq/L (ref 135–145)

## 2023-03-22 LAB — T4, FREE: T4 Free: 0.82 ng/dL (ref 0.69–1.48)

## 2023-03-22 LAB — HEMOGLOBIN A1C
Average Estimated Glucose: 125.5 mg/dL
Hemoglobin A1C: 6 % — ABNORMAL HIGH (ref 4.6–5.6)

## 2023-03-22 LAB — T3 TOTAL: T3 Total: 0.62 ng/mL (ref 0.48–1.59)

## 2023-03-22 LAB — THYROID STIMULATING HORMONE (TSH) WITH REFLEX TO FREE T4: TSH: 0.12 u[IU]/mL — ABNORMAL LOW (ref 0.35–4.94)

## 2023-03-22 MED ORDER — BUTALBITAL-APAP-CAFFEINE 50-325-40 MG PO TABS
2.0000 | ORAL_TABLET | Freq: Once | ORAL | Status: AC
Start: 2023-03-22 — End: 2023-03-22
  Administered 2023-03-22: 2 via ORAL
  Filled 2023-03-22: qty 2

## 2023-03-22 MED ORDER — MORPHINE SULFATE 2 MG/ML IJ/IV SOLN (WRAP)
2.0000 mg | Status: DC | PRN
Start: 2023-03-22 — End: 2023-03-29
  Administered 2023-03-22 – 2023-03-29 (×24): 2 mg via INTRAVENOUS
  Filled 2023-03-22 (×24): qty 1

## 2023-03-22 MED ORDER — ACETAMINOPHEN 10 MG/ML IV SOLN
1000.0000 mg | Freq: Once | INTRAVENOUS | Status: AC
Start: 2023-03-22 — End: 2023-03-22
  Administered 2023-03-22: 1000 mg via INTRAVENOUS
  Filled 2023-03-22: qty 100

## 2023-03-22 MED ORDER — BENGAY GREASELESS 10-15 % EX CREA
TOPICAL_CREAM | Freq: Three times a day (TID) | CUTANEOUS | Status: DC | PRN
Start: 2023-03-22 — End: 2023-04-07
  Filled 2023-03-22: qty 85

## 2023-03-22 MED ORDER — SODIUM CHLORIDE 0.9 % IV SOLN
INTRAVENOUS | Status: AC
Start: 2023-03-22 — End: 2023-03-23

## 2023-03-22 MED ORDER — OXYCODONE HCL 5 MG PO TABS
5.0000 mg | ORAL_TABLET | Freq: Four times a day (QID) | ORAL | Status: DC | PRN
Start: 2023-03-22 — End: 2023-03-22

## 2023-03-22 MED ORDER — CALCIUM POLYCARBOPHIL 625 MG PO TABS
625.0000 mg | ORAL_TABLET | Freq: Two times a day (BID) | ORAL | Status: AC
Start: 2023-03-22 — End: ?
  Administered 2023-03-24 – 2023-04-05 (×22): 625 mg via ORAL
  Filled 2023-03-22 (×35): qty 1

## 2023-03-22 MED ORDER — METOCLOPRAMIDE HCL 5 MG/ML IJ SOLN
5.0000 mg | Freq: Four times a day (QID) | INTRAMUSCULAR | Status: DC
Start: 2023-03-22 — End: 2023-03-26
  Administered 2023-03-22 – 2023-03-26 (×15): 5 mg via INTRAVENOUS
  Filled 2023-03-22 (×15): qty 2

## 2023-03-22 MED ORDER — ACETAMINOPHEN 325 MG PO TABS
650.0000 mg | ORAL_TABLET | Freq: Three times a day (TID) | ORAL | Status: AC | PRN
Start: 2023-03-22 — End: ?
  Administered 2023-04-04 – 2023-04-06 (×5): 650 mg via ORAL
  Filled 2023-03-22 (×9): qty 2

## 2023-03-22 MED ORDER — INSULIN LISPRO 100 UNIT/ML SOLN (WRAP)
1.0000 [IU] | Freq: Every evening | Status: DC
Start: 2023-03-22 — End: 2023-04-06

## 2023-03-22 MED ORDER — PANTOPRAZOLE SODIUM 40 MG IV SOLR
40.0000 mg | Freq: Two times a day (BID) | INTRAVENOUS | Status: AC
Start: 2023-03-22 — End: ?
  Administered 2023-03-22 – 2023-03-24 (×5): 40 mg via INTRAVENOUS
  Filled 2023-03-22 (×5): qty 40

## 2023-03-22 MED ORDER — SENNOSIDES-DOCUSATE SODIUM 8.6-50 MG PO TABS
1.0000 | ORAL_TABLET | Freq: Two times a day (BID) | ORAL | Status: DC
Start: 2023-03-22 — End: 2023-04-01
  Administered 2023-03-23 – 2023-04-01 (×17): 1 via ORAL
  Filled 2023-03-22 (×17): qty 1

## 2023-03-22 MED ORDER — INSULIN LISPRO 100 UNIT/ML SOLN (WRAP)
1.0000 [IU] | Freq: Three times a day (TID) | Status: DC
Start: 2023-03-22 — End: 2023-04-06

## 2023-03-22 NOTE — Discharge Instr - Diet (Signed)
 Speech-Language Pathology:  Speech-Language Pathology evaluated swallowing during this admission and completed the following: Clinical Bedside Swallow Evaluation.  This resulted in a diet recommendation of Soft and Bite Sized (IDDSI SB6) solids with Thin (IDDSI TN0) liquids.     Soft & Bite-Sized food is recommended if you are not able to bite off pieces of food safely but are able to chew bite-sized pieces down into little pieces that are safe to swallow. The pieces are 'bite-sized' to reduce choking risk. Soft & Bite-Sized foods are eaten using a fork, spoon or chopsticks.    Thin liquids are recommended if you do not have a swallowing problem with liquids. Water, milk, tea, coffee, and juice are all examples of the Level 0 Thin thickness level. Thin liquids can be taken through a straw or standard cup.

## 2023-03-22 NOTE — SLP Eval Note (Signed)
 [facility]   Speech Language Pathology  SLP Clinical Bedside Swallow Evaluation     Patient: Valerie May MRN#: [id number] Room: FI110/FI110.01    Recommendations/Plan:   Recommendations:  Solids: From SLP perspective patient cleared for a Soft and Bite Sized (IDDSI SB6)  Liquids: From SLP perspective patient cleared for Thin Liquids (IDDSI TN0) via, any modality  Meds: PO, whole, crushed, with liquid, with puree (IDDSI level 4)     Precautions:   Precautions/Compensations: upright positioning, small single bites/sips, head of bed (HOB) >30 degrees after meals for 30-45 minutes, eat and drink slowly  Supervision: Distant supervision    Plan:   Referrals: Gastroenterology (GI) consult  Plan of care: begin/continue oral diet, dysphagia treatment, and patient/family education  SLP Frequency Recommended: 2-3x/wk  Discharge recommendations: Ongoing SLP at next LOC, Defer to PT/OT recommendation    Assessment:   Patient seen for swallow evaluation.     Patient's oral phase is characterized by adequate oral acceptance, reduced labial seal, prolonged mastication  of regular solids, and no oral residue present post swallow. Suspect reduced bolus control and intermittently prolonged a-p transfer. Patient's pharyngeal phase is characterized by present hyolaryngeal elevation, 1-3 swallows per bolus, and suspect adequate swallow initiation. No overt s/sx suggestive of aspiration with consistencies provided; however, intermittent eructation noted.     From SLP perspective patient cleared for a soft and bite-sized texture diet with thin liquids. Recommend medications whole with thin liquids or in puree as tolerated versus crushed as needed. Recommend standard aspiration precautions.     Given complaints of nausea/vomiting with PO could consider GI consultation as clinically indicated.     Direct provider and RN messaged via secure chat following evaluation.      SLP to continue to follow while inpatient for dysphagia  management.     Therapy Diagnosis: dysphagia unspecified    Prognosis: good    History of Present Illness:   History of Present Illness: Valerie May is a 53 y.o. female admitted on 03/21/2023 with history of asthma, eosinophilia, mild bronchiectasis and recurrent PNA/bronchitis triggered by an episode of COVID-19 pneumonia in 2021, sinus disease (s/p sinus surgery), GERD, type II diabetes mellitus, polycythemia, migraines, anxiety, lumbar spondylosis and obesity, who presented to the hospital with intractable nausea and emesis since last night as well as chest, abdomen and shoulder pain. RRT called for respiratory acidosis, likely due to a combination of poor mental status/drowsiness in the setting of IV opiate use and untreated OSA. She improved during examination and requires no acute interventions at this time.    History of Intubation: Not intubated this admission    Imaging:  CT Abd/Pelvis with IV Contrast only    Result Date: 03/21/2023  1.No acute abnormality within the chest, abdomen, or pelvis. 2.No findings of acute pulmonary embolism within the limitations stated above. 3.Hyperdense pancreatic head lesion. Further evaluation with MRI/MRCP is recommended. 4.Mildly hyperdense right renal lesion, possible hemorrhagic/proteinaceous cyst. This can also be further evaluated on MRI. 5.Additional chronic findings as above. Sue CHRISTELLA Fila, DO 03/21/2023 8:51 AM    CT Angio Chest (PE study)    Result Date: 03/21/2023  1.No acute abnormality within the chest, abdomen, or pelvis. 2.No findings of acute pulmonary embolism within the limitations stated above. 3.Hyperdense pancreatic head lesion. Further evaluation with MRI/MRCP is recommended. 4.Mildly hyperdense right renal lesion, possible hemorrhagic/proteinaceous cyst. This can also be further evaluated on MRI. 5.Additional chronic findings as above. Sue CHRISTELLA Fila, DO 03/21/2023 8:51 AM  Chest AP Portable    Result Date: 03/21/2023  No acute pulmonary process.  Odella Edelson, MD 03/21/2023 5:31 AM     Medical Diagnosis: Generalized abdominal pain [R10.84]    Medical History[1]    Speech Therapy or Relevant Medical History  None    Subjective:   Patient is agreeable to participation in the therapy session. Nursing clears patient for therapy. Patient's medical condition is appropriate for speech therapy intervention at this time. No family present at bedside.   PAIN: yes, Pain rating: 10/10 and Location: right arm, head, leg, chest, Intervention: RN notified and subsequently at bedside     Orientation:  Person - patient oriented to name   Place - (+)  Situation - (-)  Time - (+)    Behavior/cognition:  awake, alert, calm, pleasant, and cooperative    Objective:   Patient Status:    - Current diet: NPO    - Position: in bed   - Medical equipment in place: telemetry and IV    - Respiratory status:  2 L O2 via NC   - Interpreter services: n/a   - Precautions: none   - Food allergies: none    Dysphagia History (per patient self report):   -denies coughing with eating/drinking  -endorses globus sensation with solids  -denies sternal discomfort with eating/drinking  -endorses recent episodes of pneumonia in January of 2024  -denies recent unintentional weight loss  -denies history of acid reflux; however, GERD documented in PMH     Baseline Diet (per patient self report): regular solids with thin liquids     Oral Motor Skills and Voice Assessment:  Oral motor exam: WFL     Facial (CNVII - Motor):  -no asymmetry, symmetric brow movement  -reduced buccal expansion bilaterally     Labial (CNVII - Motor):  -symmetric with adequate protrusion, retraction, approximation    Velar (CNIX Glossopharyngeal - Motor):  -symmetric elevation, normal speech, resonance    Lingual (CNXII Hypoglossal - Motor):  -midline on protrusion with adequate elevation, depression, lateralization  -reduced strength to resistance bilaterally     Speech: WFL   Vocal Quality: clear vocal quality, judged severity:  normal     Oral Inspection:   Lips: moist/pink  Tongue: moist/pink  Saliva: WFL  Teeth: WFL  Oral care provided: no  Comments: none    PO Trials Presented:  Thin (TN0) via cup and via straw   Puree (PU4)  Regular Solids (RG7)    Oral Phase:  reduced extraction of bolus from spoon  prolonged but effective mastication  prolonged a/p transit     Pharyngeal Phase/Airway Protection:  present hyolaryngeal movement   1-3 swallows per bolus  no overt signs or symptoms of aspiration     Patient left with call bell within reach, all needs met, fall mat in place and bed alarm activated, and all questions answered. RN notified of session outcome and patient response.     Educated the patient to role of speech therapy, plan of care, goals of therapy and recommendations.     Goals:   -Pt will consume least restrictive diet without overt clinical concern for aspiration across 24-28 hours. NEW GOAL   -Pt will participate in ongoing assessment to determine patient need for instrumental evaluation of swallowing. NEW GOAL       Cathlean terry Douglas, MS CCC-SLP    PPE worn by provider: procedural mask and gloves     Time of Treatment:  SLP Received On: 03/22/23  Start Time: 0900  Stop Time: 0915  Time Calculation (min): 15 min         [1]   Past Medical History:  Diagnosis Date    COVID-19     COVID-19 2021    Heart valve problem     Kidney stone     Pneumonia

## 2023-03-22 NOTE — Plan of Care (Signed)
 Problem: Pain interferes with ability to perform ADL  Goal: Pain at adequate level as identified by patient  Outcome: Progressing  Flowsheets (Taken 03/22/2023 2151)  Pain at adequate level as identified by patient:   Identify patient comfort function goal   Reassess pain within 30-60 minutes of any procedure/intervention, per Pain Assessment, Intervention, Reassessment (AIR) Cycle   Assess for risk of opioid induced respiratory depression, including snoring/sleep apnea. Alert healthcare team of risk factors identified.     Problem: Everyday - Heart Failure  Goal: Stable Vital Signs and Fluid Balance  Flowsheets (Taken 03/22/2023 2151)  Stable Vital Signs and Fluid Balance:   Daily Standing Weights in the morning using the same scale, after using the bathroom and before breadfast.  If unable to stand, zero the bed and use the bed scale   Monitor labs and report abnormalities to physician   Strict Intake/Output   Assess for swelling/edema   Wean oxygen as needed if appropriate   Monitor, assess vital signs and telemetry per policy

## 2023-03-22 NOTE — Plan of Care (Signed)
 Pt passed bedside swallow eval    Cardiac symptoms: denied  Diet: NPO  Fluid Restriction? N/a  Progressive Mobility: []  Bed Rest [x]  Bed Mobility []  Dangle at bedside []  Chair   []  Walks in room []  Walks out of room  Level of Assistance: []  Standby assist [x]  +1 Assist []  +2 Assist []  Maximum assist      Nursing Plan:   Infectious work up  CPAP QHS, pulm consult  Manage headache, nausea    Problem: Day of Admission - Heart Failure  Goal: Heart Failure Admission  Outcome: Progressing  Flowsheets (Taken 03/22/2023 0320)  Heart Failure Admission:   Standing Weight on admission, if unable to stand zero the bed and use the bed scale   Fluid restriction   Initiate education with patient and caregiver using CHF Warning Zones and Educational Videos ([employer] or [employer])   Assess for swelling/edema and document   Oxygen as needed   Vital signs and telemetry per policy   Strict Intake/Output     Problem: Everyday - Heart Failure  Goal: Stable Vital Signs and Fluid Balance  Outcome: Progressing  Flowsheets (Taken 03/22/2023 0320)  Stable Vital Signs and Fluid Balance:   Daily Standing Weights in the morning using the same scale, after using the bathroom and before breadfast.  If unable to stand, zero the bed and use the bed scale   Monitor, assess vital signs and telemetry per policy   Assess for swelling/edema   Wean oxygen as needed if appropriate   Monitor labs and report abnormalities to physician   Strict Intake/Output   Fluid Restriction  Goal: Teaching-Using CHF Warning Zones and Educational Videos  Outcome: Progressing  Flowsheets (Taken 03/22/2023 0320)  Teaching-Using CHF Warning Zones and Educational Videos:   Signs & Symptoms of CHF   Fluid Restriction if appropriate   Document in the Education Tab in EPIC with Teach-back   Daily Standing Weights & record   Medications   Sodium Restriction   CHF Warning Zones and when to call for help

## 2023-03-22 NOTE — Plan of Care (Signed)
 Cardiac symptoms: SBP:110-120 HR: 90. Denies chest pain or shortness of breath.   Diet: Soft Bite Size   Fluid Restriction? N /A   Progressive Mobility: []  Bed Rest [x]  Bed Mobility   Level of Assistance: []  Standby assist [x]  +1 Assist     Nursing Plan:   Pain Management  CT Head ordered     Problem: Pain interferes with ability to perform ADL  Goal: Pain at adequate level as identified by patient  Outcome: Progressing  Flowsheets (Taken 03/22/2023 1748)  Pain at adequate level as identified by patient:   Identify patient comfort function goal   Assess for risk of opioid induced respiratory depression, including snoring/sleep apnea. Alert healthcare team of risk factors identified.   Assess pain on admission, during daily assessment and/or before any as needed intervention(s)   Evaluate if patient comfort function goal is met   Consult/collaborate with Physical Therapy, Occupational Therapy, and/or Speech Therapy   Include patient/patient care companion in decisions related to pain management as needed   Offer non-pharmacological pain management interventions   Consult/collaborate with Pain Service   Evaluate patient's satisfaction with pain management progress   Reassess pain within 30-60 minutes of any procedure/intervention, per Pain Assessment, Intervention, Reassessment (AIR) Cycle     Problem: Side Effects from Pain Analgesia  Goal: Patient will experience minimal side effects of analgesic therapy  Outcome: Progressing  Flowsheets (Taken 03/22/2023 1748)  Patient will experience minimal side effects of analgesic therapy:   Monitor/assess patient's respiratory status (RR depth, effort, breath sounds)   Evaluate for opioid-induced sedation with appropriate assessment tool (i.e. POSS)   Assess for changes in cognitive function   Prevent/manage side effects per LIP orders (i.e. nausea, vomiting, pruritus, constipation, urinary retention, etc.)     Problem: Day of Admission - Heart Failure  Goal: Heart Failure  Admission  Outcome: Progressing  Flowsheets (Taken 03/22/2023 1748)  Heart Failure Admission:   Standing Weight on admission, if unable to stand zero the bed and use the bed scale   Vital signs and telemetry per policy   Strict Intake/Output   Fluid restriction   Oxygen as needed   Assess for swelling/edema and document   Initiate education with patient and caregiver using CHF Warning Zones and Educational Videos ([employer] or [employer])     Problem: Everyday - Heart Failure  Goal: Stable Vital Signs and Fluid Balance  Outcome: Progressing  Flowsheets (Taken 03/22/2023 1748)  Stable Vital Signs and Fluid Balance:   Daily Standing Weights in the morning using the same scale, after using the bathroom and before breadfast.  If unable to stand, zero the bed and use the bed scale   Monitor labs and report abnormalities to physician   Assess for swelling/edema   Monitor, assess vital signs and telemetry per policy   Wean oxygen as needed if appropriate   Strict Intake/Output   Fluid Restriction  Goal: Mobility/Activity is Maintained at Optimal Level for Patient  Outcome: Progressing  Flowsheets (Taken 03/22/2023 1748)  Mobility/Activity is Maintained at Optimal Level for Patient:   Increase mobility as tolerated/progressive mobility protocol   Reposition patient every 2 hours and as needed unless able to reposition self   Assess for changes in respiratory status, level of consciousness and/or development of fatigue   Maintain SCD's as Ordered   Perform active/passive ROM  Goal: Nutritional Intake is Adequate  Outcome: Progressing  Flowsheets (Taken 03/22/2023 1748)  Nutritional Intake is Adequate:   Cardiac diet-2 gm Sodium  Fluid Restricction if needed   Consult/Collaborate with Nutritionist   Patient and family teaching on low sodium diet   Encourage/perform oral hygiene as appropriate   Assess appetite,anorexia and amount of meal/food tolerated  Goal: Teaching-Using CHF Warning Zones and Educational Videos  Outcome:  Progressing  Flowsheets (Taken 03/22/2023 1748)  Teaching-Using CHF Warning Zones and Educational Videos:   Signs & Symptoms of CHF   CHF Warning Zones and when to call for help   Sodium Restriction   Medications   Document in the Education Tab in EPIC with Teach-back   Fluid Restriction if appropriate   Daily Standing Weights & record     Problem: Day of Discharge - Heart Failure  Goal: Discharge Education  Outcome: Progressing  Flowsheets (Taken 03/22/2023 1748)  Day of Discharge:   After Visit Summary (Discharge Instuctions) with medications   CHF Warning Zones and when to call for help   Follow-up appointments   Low Sodium Diet   Fluid Restriction   Daily Standing Weights     Problem: Compromised Activity/Mobility  Goal: Activity/Mobility Interventions  Outcome: Progressing  Flowsheets (Taken 03/22/2023 1748)  Activity/Mobility Interventions: Pad bony prominences, TAP Seated positioning system when OOB, Promote PMP, Reposition q 2 hrs / turn clock, Offload heels     Problem: Compromised Sensory Perception  Goal: Sensory Perception Interventions  Outcome: Progressing  Flowsheets (Taken 03/22/2023 1748)  Sensory Perception Interventions: Offload heels, Pad bony prominences, Reposition q 2hrs/turn Clock, Q2 hour skin assessment under devices if present     Problem: Compromised Moisture  Goal: Moisture level Interventions  Outcome: Progressing  Flowsheets (Taken 03/22/2023 0800)  Moisture level Interventions: Moisture wicking products, Moisture barrier cream     Problem: Compromised Nutrition  Goal: Nutrition Interventions  Outcome: Progressing  Flowsheets (Taken 03/22/2023 0800)  Nutrition Interventions: Discuss nutrition at Rounds, I&Os, Document % meal eaten, Daily weights     Problem: Compromised Friction/Shear  Goal: Friction and Shear Interventions  Outcome: Progressing  Flowsheets (Taken 03/22/2023 0800)  Friction and Shear Interventions: Pad bony prominences, Off load heels, HOB 30 degrees or less unless  contraindicated, Consider: TAP seated positioning, Heel foams
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
## 2023-03-23 DIAGNOSIS — D72829 Elevated white blood cell count, unspecified: Secondary | ICD-10-CM

## 2023-03-23 DIAGNOSIS — R112 Nausea with vomiting, unspecified: Secondary | ICD-10-CM

## 2023-03-23 DIAGNOSIS — R1013 Epigastric pain: Secondary | ICD-10-CM

## 2023-03-23 DIAGNOSIS — K869 Disease of pancreas, unspecified: Secondary | ICD-10-CM

## 2023-03-23 DIAGNOSIS — R109 Unspecified abdominal pain: Secondary | ICD-10-CM

## 2023-03-23 DIAGNOSIS — R7989 Other specified abnormal findings of blood chemistry: Secondary | ICD-10-CM

## 2023-03-23 DIAGNOSIS — R9389 Abnormal findings on diagnostic imaging of other specified body structures: Secondary | ICD-10-CM

## 2023-03-23 DIAGNOSIS — G43719 Chronic migraine without aura, intractable, without status migrainosus: Secondary | ICD-10-CM

## 2023-03-23 LAB — BASIC METABOLIC PANEL
Anion Gap: 5 (ref 5.0–15.0)
BUN: 5 mg/dL — ABNORMAL LOW (ref 7–21)
CO2: 30 meq/L — ABNORMAL HIGH (ref 17–29)
Calcium: 8.5 mg/dL (ref 8.5–10.5)
Chloride: 104 meq/L (ref 99–111)
Creatinine: 0.5 mg/dL (ref 0.4–1.0)
GFR: >60 mL/min/{1.73_m2} (ref >=60.0)
Glucose: 107 mg/dL — ABNORMAL HIGH (ref 70–100)
Potassium: 3.8 meq/L (ref 3.5–5.3)
Sodium: 139 meq/L (ref 135–145)

## 2023-03-23 LAB — CBC
Absolute nRBC: 0 10*3/uL (ref <=0.00)
Hematocrit: 40.4 % (ref 34.7–43.7)
Hemoglobin: 12.8 g/dL (ref 11.4–14.8)
MCH: 29.7 pg (ref 25.1–33.5)
MCHC: 31.7 g/dL (ref 31.5–35.8)
MCV: 93.7 fL (ref 78.0–96.0)
MPV: 9.7 fL (ref 8.9–12.5)
Platelet Count: 250 10*3/uL (ref 142–346)
RBC: 4.31 10*6/uL (ref 3.90–5.10)
RDW: 12 % (ref 11–15)
WBC: 6.62 10*3/uL (ref 3.10–9.50)
nRBC %: 0 /100{WBCs} (ref <=0.0)

## 2023-03-23 LAB — WHOLE BLOOD GLUCOSE POCT
Whole Blood Glucose POCT: 120 mg/dL — ABNORMAL HIGH (ref 70–100)
Whole Blood Glucose POCT: 138 mg/dL — ABNORMAL HIGH (ref 70–100)
Whole Blood Glucose POCT: 93 mg/dL (ref 70–100)
Whole Blood Glucose POCT: 142 mg/dL — ABNORMAL HIGH (ref 70–100)

## 2023-03-23 LAB — VITAMIN B12: Vitamin B-12: 554 pg/mL (ref 211–911)

## 2023-03-23 LAB — HEPATIC FUNCTION PANEL (LFT)
ALT: 110 U/L — ABNORMAL HIGH (ref <=55)
AST (SGOT): 58 U/L — ABNORMAL HIGH (ref <=41)
Albumin/Globulin Ratio: 1.2 (ref 0.9–2.2)
Albumin: 3.2 g/dL — ABNORMAL LOW (ref 3.5–5.0)
Alkaline Phosphatase: 99 U/L (ref 37–117)
Bilirubin Direct: 0.2 mg/dL (ref 0.0–0.5)
Bilirubin Indirect: 0.2 mg/dL (ref 0.2–1.0)
Bilirubin, Total: 0.4 mg/dL (ref 0.2–1.2)
Globulin: 2.7 g/dL (ref 2.0–3.6)
Protein, Total: 5.9 g/dL — ABNORMAL LOW (ref 6.0–8.3)

## 2023-03-23 LAB — LAB USE ONLY - GOLD SST HOLD TUBE

## 2023-03-23 LAB — PROCALCITONIN: Procalcitonin: 0.05 ng/mL (ref 0.00–0.10)

## 2023-03-23 LAB — AMMONIA: Ammonia: 82 umol/L — ABNORMAL HIGH (ref 18–72)

## 2023-03-23 LAB — CORTISOL: Cortisol: 12 ug/dL

## 2023-03-23 LAB — VITAMIN D, 25 OH, TOTAL: Vitamin D 25-OH, Total: 19 ng/mL — ABNORMAL LOW (ref 30–100)

## 2023-03-23 LAB — MAGNESIUM: Magnesium: 1.9 mg/dL (ref 1.6–2.6)

## 2023-03-23 LAB — SYPHILIS SCREEN, IGG AND IGM: Syphilis Screen IgG and IgM: NONREACTIVE

## 2023-03-23 MED ORDER — KETOROLAC TROMETHAMINE 30 MG/ML IJ SOLN
30.0000 mg | Freq: Four times a day (QID) | INTRAMUSCULAR | Status: AC
Start: 2023-03-23 — End: 2023-03-24
  Administered 2023-03-23 – 2023-03-24 (×4): 30 mg via INTRAVENOUS
  Filled 2023-03-23 (×4): qty 1

## 2023-03-23 MED ORDER — SODIUM CHLORIDE 0.9 % IV SOLN
10.0000 mg | Freq: Once | INTRAVENOUS | Status: AC
Start: 2023-03-23 — End: 2023-03-23
  Administered 2023-03-23: 10 mg via INTRAVENOUS
  Filled 2023-03-23: qty 1

## 2023-03-23 MED ORDER — BISACODYL 5 MG PO TBEC
10.0000 mg | DELAYED_RELEASE_TABLET | Freq: Every day | ORAL | Status: DC
Start: 2023-03-23 — End: 2023-04-01
  Administered 2023-03-24 – 2023-04-01 (×8): 10 mg via ORAL
  Filled 2023-03-23 (×9): qty 2

## 2023-03-23 MED ORDER — MAGNESIUM SULFATE IN D5W 1-5 GM/100ML-% IV SOLN
1.0000 g | Freq: Two times a day (BID) | INTRAVENOUS | Status: AC
Start: 2023-03-23 — End: 2023-03-25
  Administered 2023-03-24 – 2023-03-25 (×3): 1 g via INTRAVENOUS
  Filled 2023-03-23 (×3): qty 100

## 2023-03-23 NOTE — PT Eval Note (Signed)
 Physical Therapy Evaluation  Patient: Valerie May    T598/T598.98  Post Acute Care Therapy Recommendations:   Discharge Recommendations: Home with supervision, Home with home health PT (may progress to not req)   D/C Milestones: stairs    If Home with supervision, Home with home health PT (may progress to not req) recommended discharge disposition is not available, pt will need pt will need HHPT & increased HOA/HHA for functional mobility/ADLs.    DME needs IF patient is discharging home: Shower chair (SPC TBD if required by time of d/c)      Therapy discharge recommendations may change with patient status.  Please refer to most recent note for up-to-date recommendations.    Assessment:   Significant Findings: elevated BP after limited mob refer below to vitals    Valerie May is a 53 y.o. female admitted 03/21/2023. Pt admitted for N/C, DOE chest pain. Pt presents after OT session. Pt ambulating w/ SBA during amb w/ fatigue reported. At EOB endorses dizziness BP assessed elevated. Cued for PLB & relaxation techniques reassess still elevated. Pt assisted w/ gown DOF/DON new & returned to supine w/ BP lowered when assessed. Pt would continue to benefit from PT to address these deficits and increase functional independence.     Vitals:     Sitting Sitting EOB after amb O2 sats 97% on 3.5L, HR 95 bpm    Orthostatic Assessment:   BP 205/115 161/130 125/76   BP Location R UE  --   --    BP Method Automatic  --   --    Pt Position Sitting after amb to bathroom L UE where PIV pole Sitting after amb to bathroom R UE  Supine     Pt educated on allowing increased time after positional changes to ensure safety w/ mobility especially if lightheaded or dizziness occurs. Educated on seated ther ex as well prior to EOB & between positional changes as well.     Impairments: Assessment: Decreased UE strength;Decreased LE strength;Decreased endurance/activity tolerance;Decreased balance;Decreased functional mobility;Gait  impairment.     Rehabilitation Potential: Prognosis: Good;With continued PT status post acute discharge    Treatment Activities: PT Eval, bed mobility/transfer training, sitting/standing tolerance & endurance training, NMR/balance training, gait training, ther ex    Educated the patient to role of physical therapy, plan of care, goals of therapy and safety with mobility and ADLs, energy conservation techniques, pursed lip breathing.      Plan:   Treatment/Interventions: Exercise, Gait training, Stair training, Functional transfer training, LE strengthening/ROM, Endurance training, Bed mobility, Equipment eval/education     PT Frequency: 4-5x/wk     Risks/Benefits/POC Discussed with Pt/Family: With patient       [facility]  Bed: W401/W401.01     Precautions and Contraindications:   Weight Bearing Status: no restrictions  Fall Risks: Medium, History of fall(s), Impaired balance/gait, Impaired mobility, Muscle weakness (dizziness)  Other Precautions: Falls, Dizziness    Consult received for Valerie May for PT Evaluation and Treatment.  Patient's medical condition is appropriate for Physical therapy intervention at this time.    Medical Diagnosis: Generalized abdominal pain [R10.84]    History of Present Illness:   Valerie May is a 53 y.o. female admitted on 03/21/2023 with  history of asthma, eosinophilia, mild bronchiectasis, recurrent pneumonia/bronchitis, migraine headaches, prolonged admit in [place]  for COVID-19 in 2021, sinus disease, GERD, DM-2 who presented to [facility] 03/21/2023 with severe nausea and vomiting, dyspnea, chest abdominal pain,  acute on chronic pain in right arm, headache.  Early course complicated by acute respiratory acidosis felt to be due to IV narcotics.     Per MD Note 03/22/23    Past Medical/Surgical History:  Medical History[1]   Past Surgical History[2]      X-Rays/Tests/Labs:  CT Head WO Contrast    Result Date: 03/22/2023   No acute intracranial abnormality.  Debby ONEIDA Sewer, MD 03/22/2023 7:28 PM    Shoulder Right 2+ Views    Result Date: 03/21/2023  1. No fracture of the shoulder. 2. Old healed fractures of the right lateral third and fourth ribs. Derick Clap, MD 03/21/2023 2:09 PM    CT Abd/Pelvis with IV Contrast only    Result Date: 03/21/2023  1.No acute abnormality within the chest, abdomen, or pelvis. 2.No findings of acute pulmonary embolism within the limitations stated above. 3.Hyperdense pancreatic head lesion. Further evaluation with MRI/MRCP is recommended. 4.Mildly hyperdense right renal lesion, possible hemorrhagic/proteinaceous cyst. This can also be further evaluated on MRI. 5.Additional chronic findings as above. Sue CHRISTELLA Fila, DO 03/21/2023 8:51 AM    CT Angio Chest (PE study)    Result Date: 03/21/2023  1.No acute abnormality within the chest, abdomen, or pelvis. 2.No findings of acute pulmonary embolism within the limitations stated above. 3.Hyperdense pancreatic head lesion. Further evaluation with MRI/MRCP is recommended. 4.Mildly hyperdense right renal lesion, possible hemorrhagic/proteinaceous cyst. This can also be further evaluated on MRI. 5.Additional chronic findings as above. Sue CHRISTELLA Fila, DO 03/21/2023 8:51 AM    Chest AP Portable    Result Date: 03/21/2023  No acute pulmonary process. Odella Edelson, MD 03/21/2023 5:31 AM    WBC   Date Value Ref Range Status   03/23/2023 6.62 3.10 - 9.50 x10 3/uL Final   11/07/2020 9.23 3.10 - 9.50 x10 3/uL Final   07/06/2007 9.00 3.50 - 10.80 /CUMM Final     RBC   Date Value Ref Range Status   03/23/2023 4.31 3.90 - 5.10 x10 6/uL Final   11/07/2020 5.35 (H) 3.90 - 5.10 x10 6/uL Final     Hemoglobin   Date Value Ref Range Status   03/23/2023 12.8 11.4 - 14.8 g/dL Final     Hgb   Date Value Ref Range Status   11/07/2020 16.2 (H) 11.4 - 14.8 g/dL Final     Hematocrit   Date Value Ref Range Status   03/23/2023 40.4 34.7 - 43.7 % Final   11/07/2020 48.8 (H) 34.7 - 43.7 % Final     MCV   Date Value Ref Range  Status   03/23/2023 93.7 78.0 - 96.0 fL Final   11/07/2020 91.2 78.0 - 96.0 fL Final        Social History:   Prior Level of Function:  Prior level of function: Independent with ADLs, Ambulates independently  Baseline Activity Level: Community ambulation  Driving: independent  Cooking: Yes  Employment: Unemployed (since COVID patient has not been able to work)    Home Living Arrangements:  Living Arrangements: Family members (1st son & girlfiend w/ 3 grandchildren, 2nd son & his girlfriend, daughter & boyfriend)  Type of Home: Apartment  Home Layout: One level (5 STE w/ rail)  Firefighter: Standard  Home Living - Notes / Comments: sleeping on air mattress, reports her car was taken by her ex with oxygen machine inside so has not been used    Subjective:   Patient is agreeable to participation in the therapy session. Nursing clears  patient for therapy.   Patient Goal: to get better & back to PLOF prior to multiple sickness recently  Pain Assessment  Pain Assessment:  (headache no specific number reported states pounding)  Objective:   Observation of Patient/Vital Signs:  Patient is in bed with peripheral IV, O2 at 3.5 liters/minute via nasal cannula in place.    Observation of Patient/Vital signs:  Inspection/Posture: standing in bathroom w/  OT    Cognition/Neuro Status  Arousal/Alertness: Appropriate responses to stimuli  Attention Span: Appears intact  Orientation Level: Oriented X4  Memory: Appears intact  Following Commands: Follows all commands and directions without difficulty  Safety Awareness: minimal verbal instruction  Insights: Fully aware of deficits;Educated in safety awareness  Problem Solving: Assistance required to identify errors made  Behavior: attentive;cooperative  Motor Planning: intact  Coordination: intact    Musculoskeletal Examination:  Gross ROM  Neck/Trunk ROM: within functional limits  Right Upper Extremity ROM: within functional limits  Left Upper Extremity ROM: within functional  limits  Right Lower Extremity ROM: within functional limits  Left Lower Extremity ROM: within functional limits    Gross Strength  Neck/Trunk Strength: WFL  Right Upper Extremity Strength: within functional limits  Left Upper Extremity Strength: within functional limits  Right Lower Extremity Strength: within functional limits  Left Lower Extremity Strength: within functional limits    Tone  Tone: within functional limits    Other Testing:  Hearing: WNL  Vision: WNL, no blurry or double vision reported  Sensation: WNL denies numbness or tingling    Functional Mobility:  Sit to Supine: to Left;Independent  Stand to Sit: Supervision    Transfers  Posterior Scoot Transfers: Independent    Ambulation:  PMP - Progressive Mobility Protocol   PMP Activity: Step 6 - Walks in Room  Distance Walked (ft) (Step 6,7): 10 Feet    Ambulation: Stand by Assist  Pattern: R foot decreased clearance;L foot decreased clearance;decreased cadence;decreased step length;Step through  Stair Management: not attempted (high BP after toileting)     Balance:  Sitting - Static: Good  Standing - Static: Good         Participation and Activity Tolerance:  Participation Effort: good  Endurance: Tolerates 10 - 20 min exercise with multiple rests    Patient left with call bell within reach, all needs met, SCDs off, fall mat down, bed alarm on cleared by RN and all questions answered. RN notified of session outcome and patient response.     PPE Worn by Therapist: procedural mask and gloves   Tech: N/A  PPE Worn by Patient:  No PPE required/worn   Translator: N/A    Goals  Goal Formulation: With patient  Time for Goal Acheivement: 3 visits  Pt Will Go Supine To Sit: independent  Pt Will Perform Sit to Stand: independent  Pt Will Ambulate: 31-50 feet, modified independent (w/ LRAD)  Pt Will Go Up / Down Stairs: 3-5 stairs, with supervision, With rail       Dania Bring, PT, DPT, Pager [phone], 03/23/2023, 10:22 AM     Time of Treatment  PT Received On:  03/23/23  Start Time: 0945  Stop Time: 1000  Time Calculation (min): 15 min       [1]   Past Medical History:  Diagnosis Date    COVID-19     COVID-19 2021    Heart valve problem     Kidney stone     Pneumonia    [2]   Past Surgical  History:  Procedure Laterality Date    CYST REMOVAL      GALLBLADDER SURGERY      HERNIA REPAIR      HYSTERECTOMY      TUBAL LIGATION

## 2023-03-23 NOTE — Progress Notes (Signed)
 4 eyes in 4 hours pressure injury assessment note:      Completed with: Con, CT  Unit & Time admitted: [facility] 18:30             Bony Prominences: Check appropriate box; if wound is present enter wound assessment in LDA     Occiput:                 [x] WNL  []  Wound present  Face:                     [x] WNL  []  Wound present  Ears:                      [x] WNL  []  Wound present  Spine:                    [x] WNL  []  Wound present  Shoulders:             [x] WNL  []  Wound present  Elbows:                  [x] WNL  []  Wound present  Sacrum/coccyx:     [x] WNL  []  Wound present  Ischial Tuberosity:  [x] WNL  []  Wound present  Trochanter/Hip:      [x] WNL  []  Wound present  Knees:                   [x] WNL  []  Wound present  Ankles:                   [x] WNL  []  Wound present  Heels:                    [x] WNL  []  Wound present  Other pressure areas:  []  Wound location       Device related: []  Device name:         LDA completed if wound present: yes  Consult WOCN if necessary    Other skin related issues, ie tears, rash, etc, document in Integumentary flowsheet

## 2023-03-23 NOTE — OT Eval Note (Signed)
 Occupational Therapy Eval Valerie May        Post Acute Care Therapy Recommendations:     Discharge Recommendations:  Home with supervision, Home with home health OT    DME needs IF patient is discharging home: Shower chair    Therapy discharge recommendations may change with patient status.  Please refer to most recent note for up-to-date recommendations.      Assessment:   Significant Findings: (+) dizziness and hypertension s/p grooming standing to sink, RN notified immediately.    Valerie May is a 53 y.o. female admitted 03/21/2023. Pt received in bed and was agreeable to OT. Pt reports being independent in ADL/IADL tasks prior to this recent illness. Pt reports desire to return to work and drive. Patient described having various social stressors. Pt presents with decreased endurance/activity tolerance and decreased independence with IADLs limiting her performance in ADL's. Rec home with supervision and HHOT. Pt is below functional baseline, and will benefit from acute OT services to optimize return to PLOF.     Therapy Diagnosis: dizziness, generalized weakness    Rehabilitation Potential: Good    Educated the patient to role of occupational therapy, plan of care, goals of therapy and safety with mobility and ADLs, energy conservation techniques, pursed lip breathing.    Plan:   OT Frequency Recommended: 1-2x/wk     Treatment/Interventions: Treatment Interventions: ADL retraining, Endurance training, Functional transfer training, Compensatory technique education    Risks/benefits/POC discussed with Pt       [facility]  Bed: W401/W401.01        Precautions and Contraindications:   Weight Bearing Status: no restrictions  Other Precautions: Falls, Dizziness    Consult received for Valerie May for OT Evaluation and Treatment.  Patient's medical condition is appropriate for Occupational Therapy intervention at this time.      History of Present Illness:    Valerie May is a 53  y.o. female admitted on 03/21/2023 with a history of asthma, eosinophilia mild bronchiectasis and recurrent PNA/bronchitis triggered by an episode of COVID-19 pneumonia in 2021, prior acute respiratory failure with hypoxia and hypercapnia (resolved), sinus disease (s/p sinus surgery), GERD, type II diabetes mellitus on metformin, polycythemia, migraines, anxiety, lumbar spondylosis and obesity,  who presents to the hospital with intractable nausea and emesis since last night as well as chest, abdomen and shoulder pain.  Meeting SIRS criteria on admission, unclear etiology of her symptoms, no clear evidence of pneumonia or other infection. per H&P.    Admitting Diagnosis: Generalized abdominal pain [R10.84]    Past Medical/Surgical History:  PMH: reviewed  PSH: reviewed   Medical History[1]  Past Surgical History[2]    Imaging/Tests/Labs:  Rad: reviewed   CT Head WO Contrast    Result Date: 03/22/2023   No acute intracranial abnormality. Debby ONEIDA Sewer, MD 03/22/2023 7:28 PM    Shoulder Right 2+ Views    Result Date: 03/21/2023  1. No fracture of the shoulder. 2. Old healed fractures of the right lateral third and fourth ribs. Derick Clap, MD 03/21/2023 2:09 PM    CT Abd/Pelvis with IV Contrast only    Result Date: 03/21/2023  1.No acute abnormality within the chest, abdomen, or pelvis. 2.No findings of acute pulmonary embolism within the limitations stated above. 3.Hyperdense pancreatic head lesion. Further evaluation with MRI/MRCP is recommended. 4.Mildly hyperdense right renal lesion, possible hemorrhagic/proteinaceous cyst. This can also be further evaluated on MRI. 5.Additional chronic findings as above. Sue M  Gershon, DO 03/21/2023 8:51 AM    CT Angio Chest (PE study)    Result Date: 03/21/2023  1.No acute abnormality within the chest, abdomen, or pelvis. 2.No findings of acute pulmonary embolism within the limitations stated above. 3.Hyperdense pancreatic head lesion. Further evaluation with MRI/MRCP is  recommended. 4.Mildly hyperdense right renal lesion, possible hemorrhagic/proteinaceous cyst. This can also be further evaluated on MRI. 5.Additional chronic findings as above. Sue CHRISTELLA Gershon, DO 03/21/2023 8:51 AM    Chest AP Portable    Result Date: 03/21/2023  No acute pulmonary process. Odella Edelson, MD 03/21/2023 5:31 AM   Lab Results   Component Value Date/Time    HGB 12.8 03/23/2023 03:30 AM    HGB 16.2 (H) 11/07/2020 02:48 PM    HCT 40.4 03/23/2023 03:30 AM    HCT 48.8 (H) 11/07/2020 02:48 PM    K 3.8 03/23/2023 03:30 AM    K 3.9 11/07/2020 02:48 PM    NA 139 03/23/2023 03:30 AM    NA 139 11/07/2020 02:48 PM    INR 1.0 03/22/2023 02:41 PM    INR 1.2 (H) 07/05/2007 09:25 PM    TROPI <2.7 03/21/2023 05:06 AM       Social History:   Prior level of function: Independent with ADLs, Ambulates independently  Baseline Activity Level: Community ambulation  Ambulated 100 feet or more prior to admission: Yes  Driving: independent  Dressing - Upper Body: independent  Dressing - Lower Body: independent  Cooking: Yes  Feeding: independent  Bathing: independent  Grooming: independent  Toileting: independent  Employment: Unemployed    Home Living Arrangements:  Living Arrangements: Family members  Type of Home: Apartment  Home Layout: One level, Stairs to enter with rails (add number in comment), Able to live on main level with bedroom/bathroom (5, railing R side)     Bathroom Toilet: Standard     Home Living - Notes / Comments: Pt lives with her son, his three children (twins 10 y.o. old and 12 month old), son's girlfriend, her daughter, daughter's boyfriend, dog and cat. Pt has been sleeping on air mattress in son's room. Pt reports her car was taken by her ex with oxygen machine inside it. Due to health complications after COVID patient has not been able to work. Pt reports feeling very depressed with life stressors.    Subjective: Subjective: When I get up I get dizzy.   Patient is agreeable to participation in therapy  session and Nursing clears patient for therapy.    Patient Goal: Patient Goal: To be independent, work  Pain:   Pain Assessment:  (Pt reports radiating head, R neck and RUE pain under control s/p morphine  administration.)             Pain interventions: premedicated, emotional support, and rest    Objective:   Patient is in bed with dressings and peripheral IV, O2 at 3.5 liters/minute via nasal cannula in place.  Pt wore mask during therapy session:No      Vital Signs:  BP after activity: 205/115 mmHg (report of dizziness). RN notified immediately via phone call and attended to patient.  BP Sitting EOB after activity: 160/131 mmHg  BP Supine: 125/76 mmHg  SPO2 96-99% on 3.5L O2 NC [place]ter throughout  HR up to 113 bpm with OOB activity    Cognitive Status and Neuro Exam:  Arousal/Alertness: Appropriate responses to stimuli  Attention Span: Appears intact  Orientation Level: Oriented X4  Memory: Appears intact  Following Commands: independent  Safety Awareness: independent  Insights: Fully aware of deficits  Problem Solving: Able to problem solve independently  Behavior: attentive, calm, cooperative  Motor Planning: intact  Coordination: intact  Hand Dominance: right handed    Musculoskeletal Examination  Right Upper Extremity ROM: within functional limits  Left Upper Extremity ROM: within functional limits     Right Upper Extremity Strength: within functional limits  Left Upper Extremity Strength: within functional limits    Sensory/Oculomotor Examination  Auditory: intact  Tactile - Light Touch: intact  Visual Acuity: intact    Activities of Daily Living  Eating: Independent, in bed  Grooming: Supervision, standing at sink, wash/dry face, wash/dry hands, teeth care, brushing hair  UB Dressing: Modified Independent, at edge of bed  LB Dressing: Supervision, edge of bed  Toileting: Modified Independent, sitting, standing, clothing management up, clothing management down, perineal hygiene    Functional Mobility:  Supine  to Sit: Modified Independent  Sit to Supine: Modified Independent  Sit to Stand: Modified Independent  Functional Mobility/Ambulation: Supervision    Balance  Static Sitting Balance: good  Dyanamic Sitting Balance: good  Static Standing Balance: good  Dynamic Standing Balance: good    Participation and Activity Tolerance  Participation Effort: excellent  Endurance: Tolerates 30 min exercise with multiple rests  [facility] Dyspnea Scale: 1+ Dyspnea    Patient left with call bell within reach, all needs met,   SCD's off (as found)  Floor mat in place  Bed alarm on  and all questions answered. RN notified of session outcome and patient response.      Goals:  Time For Goal Achievement: 3 visits  ADL Goals  Patient will groom self: Independent  Patient will dress lower body: Independent  Pt will complete bathing: Independent  Mobility and Transfer Goals  Other Goal: Pt will mobilize a large household distance independently, rest breaks as needed                         PPE worn during session: procedural mask and gloves    Tech present: N/A  PPE worn by tech: N/A        Duwaine Fowler, MS, OTR/L  Can be reached via Secure Chat       Time of Treatment:   OT Received On: 03/23/23  Start Time: 0914  Stop Time: 0945  Time Calculation (min): 31 min       [1]   Past Medical History:  Diagnosis Date    COVID-19     COVID-19 2021    Heart valve problem     Kidney stone     Pneumonia    [2]   Past Surgical History:  Procedure Laterality Date    CYST REMOVAL      GALLBLADDER SURGERY      HERNIA REPAIR      HYSTERECTOMY      TUBAL LIGATION

## 2023-03-23 NOTE — Progress Notes (Addendum)
 Pt arrived to floor and ambulated from stretcher to bed. C/o nausea, PRN zofran  administered with good effect. Pt oriented to room and educated on safety protocols. Pt resting comfortably, plan of care ongoing.    4 eyes in 4 hours pressure injury assessment note:      Completed with: Alem CT  Unit & Time admitted: [facility] 2340             Bony Prominences: Check appropriate box; if wound is present enter wound assessment in LDA     Occiput:                 [x] WNL  []  Wound present  Face:                     [x] WNL  []  Wound present  Ears:                      [x] WNL  []  Wound present  Spine:                    [x] WNL  []  Wound present  Shoulders:             [x] WNL  []  Wound present  Elbows:                  [x] WNL  []  Wound present  Sacrum/coccyx:     [x] WNL  []  Wound present  Ischial Tuberosity:  [x] WNL  []  Wound present  Trochanter/Hip:      [x] WNL  []  Wound present  Knees:                   [x] WNL  []  Wound present  Ankles:                   [x] WNL  []  Wound present  Heels:                    [x] WNL  []  Wound present  Other pressure areas:  []  Wound location       Device related: []  Device name:         LDA completed if wound present: no  Consult WOCN if necessary    Other skin related issues, ie tears, rash, etc, document in Integumentary flowsheet

## 2023-03-23 NOTE — Plan of Care (Signed)
 Nursing progress note:      Neuro: A/Ox4, patient calm and cooperative  Vitals: Vitals remained stable on 2-4L NC, Cpap for sleep  Integ: Intact  Respiratory: Lung sounds clear, patient denies SOB  Cardio: No tele, patient denies chest pain  GU: external cath; adequate urine output  GI: small and bite sized diet, +N/-V; +flatus LBM pta  IV: SL     Pain control: Managed with PRN morphine  x2  Mobility: x1-2 bed mobility d/t pain  Safety: safety precautions in place, call bell within reach     Problem: Pain interferes with ability to perform ADL  Goal: Pain at adequate level as identified by patient  Outcome: Progressing     Problem: Side Effects from Pain Analgesia  Goal: Patient will experience minimal side effects of analgesic therapy  Outcome: Progressing     Problem: Day of Admission - Heart Failure  Goal: Heart Failure Admission  Outcome: Progressing     Problem: Everyday - Heart Failure  Goal: Stable Vital Signs and Fluid Balance  Outcome: Progressing  Goal: Mobility/Activity is Maintained at Optimal Level for Patient  Outcome: Progressing  Goal: Nutritional Intake is Adequate  Outcome: Progressing  Goal: Teaching-Using CHF Warning Zones and Educational Videos  Outcome: Progressing     Problem: Day of Discharge - Heart Failure  Goal: Discharge Education  Outcome: Progressing     Problem: Compromised Activity/Mobility  Goal: Activity/Mobility Interventions  Outcome: Progressing     Problem: Compromised Sensory Perception  Goal: Sensory Perception Interventions  Outcome: Progressing     Problem: Compromised Moisture  Goal: Moisture level Interventions  Outcome: Progressing     Problem: Compromised Nutrition  Goal: Nutrition Interventions  Outcome: Progressing     Problem: Compromised Friction/Shear  Goal: Friction and Shear Interventions  Outcome: Progressing     Problem: Moderate/High Fall Risk Score >5  Goal: Patient will remain free of falls  Outcome: Progressing

## 2023-03-23 NOTE — Nursing Progress Note (Signed)
 Received pt from [employer]. Pt awake and in stable condition. RR 25, otherwise VSS. Pt on 3.5L NC. Reported pain; heat pad given.

## 2023-03-23 NOTE — Plan of Care (Signed)
 Nursing Progress Note:    Neuro: Pt is A&Ox4, VSS, On 2-4 L NC  GIGU:  + N/V, no BM this shift, on small bite sized diet   Cardiac: No tele, No chest pain  Pulm: denies SOB  Skin: with scattered bruising and tattoos  Pain: morphine  given once  Mobility: One person's assist with walker  Safety: Pt is a fall risk. Fall precautions in place: bed in lowest position, fall mat in place, bed alarm on, call light is within reach.        Problem: Pain interferes with ability to perform ADL  Goal: Pain at adequate level as identified by patient  Outcome: Progressing  Flowsheets (Taken 03/23/2023 0845)  Pain at adequate level as identified by patient:   Identify patient comfort function goal   Assess pain on admission, during daily assessment and/or before any as needed intervention(s)   Reassess pain within 30-60 minutes of any procedure/intervention, per Pain Assessment, Intervention, Reassessment (AIR) Cycle     Problem: Side Effects from Pain Analgesia  Goal: Patient will experience minimal side effects of analgesic therapy  Outcome: Progressing  Flowsheets (Taken 03/23/2023 0845)  Patient will experience minimal side effects of analgesic therapy: Monitor/assess patient's respiratory status (RR depth, effort, breath sounds)     Problem: Day of Admission - Heart Failure  Goal: Heart Failure Admission  Outcome: Progressing  Flowsheets (Taken 03/23/2023 1851)  Heart Failure Admission:   Strict Intake/Output   Oxygen as needed   Vital signs and telemetry per policy   Assess for swelling/edema and document   Initiate education with patient and caregiver using CHF Warning Zones and Educational Videos ([employer] or [employer])     Problem: Everyday - Heart Failure  Goal: Stable Vital Signs and Fluid Balance  Outcome: Progressing  Flowsheets (Taken 03/23/2023 1851)  Stable Vital Signs and Fluid Balance:   Monitor, assess vital signs and telemetry per policy   Monitor labs and report abnormalities to physician   Strict  Intake/Output   Fluid Restriction   Assess for swelling/edema  Goal: Mobility/Activity is Maintained at Optimal Level for Patient  Outcome: Progressing  Flowsheets (Taken 03/23/2023 1851)  Mobility/Activity is Maintained at Optimal Level for Patient:   Increase mobility as tolerated/progressive mobility protocol   Reposition patient every 2 hours and as needed unless able to reposition self   Assess for changes in respiratory status, level of consciousness and/or development of fatigue   Consult with Physical Therapy and/or Occupational Therapy  Goal: Nutritional Intake is Adequate  Outcome: Progressing  Flowsheets (Taken 03/23/2023 1851)  Nutritional Intake is Adequate:   Assess appetite,anorexia and amount of meal/food tolerated   Encourage/perform oral hygiene as appropriate   Patient and family teaching on low sodium diet  Goal: Teaching-Using CHF Warning Zones and Educational Videos  Outcome: Progressing  Flowsheets (Taken 03/23/2023 1851)  Teaching-Using CHF Warning Zones and Educational Videos:   Signs & Symptoms of CHF   Medications     Problem: Compromised Activity/Mobility  Goal: Activity/Mobility Interventions  Outcome: Progressing  Flowsheets (Taken 03/23/2023 0800)  Activity/Mobility Interventions: Pad bony prominences, TAP Seated positioning system when OOB, Promote PMP, Reposition q 2 hrs / turn clock, Offload heels     Problem: Compromised Sensory Perception  Goal: Sensory Perception Interventions  Outcome: Progressing  Flowsheets (Taken 03/23/2023 0800)  Sensory Perception Interventions: Offload heels, Pad bony prominences, Reposition q 2hrs/turn Clock, Q2 hour skin assessment under devices if present     Problem: Compromised Moisture  Goal: Moisture  level Interventions  Outcome: Progressing  Flowsheets (Taken 03/23/2023 0800)  Moisture level Interventions: Moisture wicking products, Moisture barrier cream     Problem: Compromised Nutrition  Goal: Nutrition Interventions  Outcome: Progressing  Flowsheets  (Taken 03/23/2023 0800)  Nutrition Interventions: Discuss nutrition at Rounds, I&Os, Document % meal eaten, Daily weights     Problem: Compromised Friction/Shear  Goal: Friction and Shear Interventions  Outcome: Progressing  Flowsheets (Taken 03/23/2023 0800)  Friction and Shear Interventions: Pad bony prominences, Off load heels, HOB 30 degrees or less unless contraindicated, Consider: TAP seated positioning, Heel foams     Problem: Moderate/High Fall Risk Score >5  Goal: Patient will remain free of falls  Outcome: Progressing  Flowsheets (Taken 03/23/2023 0800)  Moderate Risk (6-13):   MOD-Apply bed exit alarm if patient is confused   MOD-Floor mat at bedside (where available) if appropriate   MOD-Remain with patient during toileting   MOD-Place bedside commode and assistive devices out of sight when not in use   MOD-Re-orient confused patients   MOD-Perform dangle, stand, walk (DSW) prior to mobilization   MOD-Request PT/OT consult order for patients with gait/mobility impairment   MOD-include family in multidisciplinary POC discussions

## 2023-03-23 NOTE — Consults (Signed)
 [facility]    INITIAL CONSULT NOTE  Gastroenterology Consult Service - [facility]  Epic Chat (Group): [facility]; Pager ID: [phone]  Appointments: [phone]    Date/Time: 03/23/23 3:31 PM  Patient Name: Valerie May  Requesting Physician: Fernand Torrie LABOR, DO     Reason for Consultation:   Abdominal pain and nausea    Assessment and Plan:   Valerie May is a 53 y.o. female with history notable for prior hernia repair, cholecystectomy, polycythemia, chronic pain, recent/recurrent pneumonia, DM2, and GERD who is admitted for intractable nausea/vomiting and abdominal pain, as well as more generalized R sided pain. Additionally, she is found to have abnormal LFTs and a indeterminate pancreatic head lesion.    Pertinent problem list:  Nausea with vomiting x 1.5 mo, progressive, not tolerating any PO - broad ddx including gastric outlet obstruction, gastroparesis, PUD/gastritis, malignancy, other  R sided and epigastric abd pain; no abnormal findings on CT to explain pain, possibly due to constipation and gastritis/PUD  Heartburn d/t vomiting most likely  Chronic constipation, possibly dysmotility vs pelvic floor dysphagia  Acute elevation in LFTs, mild and improving  - CT A/P 2/22 showed normal liver morphology, patent portal and hepatic veins, and no biliary ductal dilatation  - acetaminophen  level undetectable and utox only +opiates  Leukocytosis on admission, resolved  Acute hypercapnic respiratory failure likely related to opioids, resolved  Pancreatic head lesion, 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 Patient was also seen by Sharyne Akin,  PA during this encounter. I agree with all clinical information, including the physical exam, patient history, and planning as documented by the APP within the note. We discussed and formulated the assessment and plan together and have updated as needed.    Carole CHRISTELLA Rice, MD   History:   HPI: Valerie May is a 53 y.o. female with past medical/surgical history as below, notable for polycythemia, chronic pain, recent pneumonia, DM2, and GERD, who presents to the hospital on 03/21/2023 with intractable nausea and vomiting, progressive x 1.5 months. She is nauseated regardless of PO intake. She vomits solids and liquids indiscriminately; last occurred 1 year ago after drinking water  and eating jello. She also reports epigastric pain x 1 month and RLQ/R hip pain x 3 months. She presented now due to intractable R sided generalized pain (shoulder, abdomen, leg) x 5 days. She also reports frequent headaches. She reports taking multiple pain medications including OTC advil , prescription ibuprofen , tylenol , Excedrin, and a muscle relaxant. She has chronic constipation for at least 1 year; she has BM 2-3 x/week only with the help from laxatives. She often has to strain and sometimes has to manually disimpact her stool. She has noticed some red blood on the tissue but not with the stool. She endorses heartburn.     She has no prior GI workup. She is agreeable to endoscopic eval but is nervous she will not be able to tolerate a bowel prep.     Past medical history: Medical History[1]   Past surgical history: Past  Surgical History[2]    Family history:  The patient's family history includes Bone cancer in her maternal aunt; Diabetes in her father; Heart attack in her maternal grandfather, maternal uncle, mother, and paternal grandfather; Stomach cancer in her cousin; Stroke in her son. She also reports family  history of colon cancer.     Social history:  The patient reports that she has never smoked. She has been exposed to tobacco smoke. She has never used smokeless tobacco. She reports that she does not currently use alcohol . She reports that she does not use drugs.    Allergies: Benadryl  [diphenhydramine ], Dust mite extract, Methocarbamol, Molds & smuts, Other, and Compazine [prochlorperazine]     Home Medications: Prescriptions Prior to Admission[3]  Hospital Medications: Current Inpatient Medications with Last Dose Taken[4]    Endoscopy:   No prior endoscopies performed per patient and/or family.    GI Pathology:   No GI pathology found in chart.    Physical Exam:   Visit Vitals  BP 115/69   Pulse 89   Temp 97.7 F (36.5 C) (Oral)   Resp (!) 24   Ht 1.499 m (4' 11)   Wt 84 kg (185 lb 3 oz)   LMP 03/18/2012   SpO2 97%   BMI 37.40 kg/m     General appearance - Patient observed sitting upright comfortably in bed, no acute distress.  HEENT: Sclera anicteric.   Respiratory - Non labored respirations, no audible wheezing.  Gastrointestinal - Tender with deep palpation in RUQ and RLQ and reports R sided pain when L side is palpated. Soft, non-distended. Post-surgical scaring noted around the umbilicus.   Neurologic - Alert and interactive. Oriented to person, place, time, and situation.  Skin: Warm and dry, normal color.     Labs:     Recent Labs   Lab 03/23/23  0330 03/22/23  1441 03/22/23  0420 03/21/23  0506   WBC 6.62 5.94 6.65 17.25*   Hemoglobin 12.8 12.9 13.3 15.5*   Hematocrit 40.4 40.5 42.3 46.4*   Platelet Count 250 260 282 343   Sodium 139 138 141 141   Potassium 3.8 3.7 3.4* 3.7   Chloride 104 104 107 107   CO2 30* 30* 29 24   BUN 5* 8 9 16    Creatinine 0.5 0.6 0.5 0.6   Calcium  8.5 8.4* 8.5 9.0   AST (SGOT) 58* 75* 98* 31   ALT 110* 120* 141* 25   Alkaline Phosphatase 99 82 82 87   Bilirubin, Total 0.4 0.5 0.5 0.5   Albumin 3.2* 3.2* 3.4* 4.1   Protein, Total 5.9* 6.1 6.2 7.4   Glucose 107* 95 111* 137*    Lipase  --   --   --  26   PT  --  11.8  --   --    INR  --  1.0  --   --        Radiology:   CT Abd/Pelvis with IV Contrast only  Result Date: 03/21/2023  1.No acute abnormality within the chest, abdomen, or pelvis. 2.No findings of acute pulmonary embolism within the limitations stated above. 3.Hyperdense pancreatic head lesion. Further evaluation with MRI/MRCP is recommended. 4.Mildly hyperdense right renal lesion, possible hemorrhagic/proteinaceous cyst. This can also be further evaluated on MRI. 5.Additional chronic findings as above. Sue CHRISTELLA Fila, DO 03/21/2023 8:51 AM         [1]   Past Medical History:  Diagnosis Date    COVID-19  COVID-19 2021    Heart valve problem     Kidney stone     Pneumonia    [2]   Past Surgical History:  Procedure Laterality Date    CYST REMOVAL      GALLBLADDER SURGERY      HERNIA REPAIR      HYSTERECTOMY      TUBAL LIGATION     [3]   Medications Prior to Admission   Medication Sig Dispense Refill Last Dose/Taking    acetaminophen  (TYLENOL ) 650 MG CR tablet TAKE 1 TABLET BY MOUTH EVERY 8 HOURS AS NEEDED FOR PAIN (Patient taking differently: every 8 (eight) hours as needed) 90 tablet 1     albuterol  sulfate HFA (PROVENTIL ) 108 (90 Base) MCG/ACT inhaler Inhale 1-2 puffs into the lungs every 4 (four) hours as needed       benzonatate  (TESSALON ) 200 MG capsule Take 1 capsule (200 mg) by mouth 3 (three) times daily as needed for Cough       Budeson-Glycopyrrol-Formoterol  160-9-4.8 MCG/ACT Aerosol Inhale 2 puffs into the lungs 2 (two) times daily 10.7 g 5     budesonide -formoterol  (SYMBICORT ) 160-4.5 MCG/ACT inhaler Inhale 2 puffs into the lungs 2 (two) times daily as needed       cetirizine  (ZyrTEC ) 10 MG tablet Take 10 mg by mouth daily (Patient not taking: Reported on 12/09/2022)       [employer] Aspirin  Low Dose 81 MG EC tablet Take 1 tablet (81 mg) by mouth daily       diphenhydrAMINE -APAP, sleep, (Excedrin PM) 38-500 MG Tab Take by mouth daily       famotidine  (PEPCID ) 10 MG tablet  Take 10 mg by mouth 2 (two) times daily (Patient not taking: Reported on 12/09/2022)       metFORMIN (GLUCOPHAGE) 500 MG tablet Take 1 tablet (500 mg) by mouth 2 (two) times daily with meals       methocarbamol (ROBAXIN) 750 MG tablet as needed       montelukast  (SINGULAIR ) 10 MG tablet Take 1 tablet (10 mg) by mouth nightly       naproxen  (EC NAPROSYN ) 500 MG EC tablet Take 1 tablet (500 mg) by mouth 2 (two) times daily with meals (Patient not taking: Reported on 12/09/2022) 60 tablet 1     ondansetron  (ZOFRAN ) 8 MG tablet Take by mouth every 8 (eight) hours as needed for Nausea (Patient not taking: Reported on 12/09/2022)       pantoprazole  (PROTONIX ) 40 MG tablet TAKE 1 TABLET BY MOUTH EVERY DAY (Patient taking differently: as needed) 30 tablet 1     vitamin D , ergocalciferol , (DRISDOL ) 50000 UNIT Cap Take 1 capsule (50,000 Units total) by mouth once a week 12 capsule 1     [DISCONTINUED] ofloxacin  (FLOXIN ) 0.3 % otic solution Place 5 drops into both ears daily (Patient not taking: Reported on 12/09/2022) 10 mL 0     [DISCONTINUED] pregabalin  (LYRICA ) 75 MG capsule Take 1 capsule (75 mg) by mouth 2 (two) times daily (Patient not taking: Reported on 12/09/2022) 60 capsule 2     [DISCONTINUED] venlafaxine  (EFFEXOR -XR) 75 MG 24 hr capsule TAKE 1 CAPSULE BY MOUTH EVERY DAY (Patient not taking: Reported on 12/09/2022) 30 capsule 1    [4]   Current Facility-Administered Medications   Medication Dose Route Frequency Last Rate Last Admin    acetaminophen  (TYLENOL ) tablet 650 mg  650 mg Oral Q8H PRN        albuterol  (PROVENTIL ) (2.5 MG/3ML) 0.083% nebulizer solution  2.5 mg  2.5 mg Nebulization Q6H PRN   2.5 mg at 03/22/23 2302    aspirin  EC tablet 81 mg  81 mg Oral Daily   81 mg at 03/23/23 0908    bengay greaseless (BENGAY) 10-15 % cream   Topical Q8H PRN   Given at 03/23/23 0335    benzocaine -menthol  (CEPACOL/CHLORASEPTIC) lozenge 1 lozenge  1 lozenge Buccal Q2H PRN        benzonatate  (TESSALON ) capsule 200 mg  200 mg  Oral TID PRN        budesonide -formoterol  (SYMBICORT ) 160-4.5 MCG/ACT inhaler 2 puff  2 puff Inhalation BID   2 puff at 03/22/23 2038    butalbital -acetaminophen -caffeine  (FIORICET ) 50-325-40 MG per tablet 1 tablet  1 tablet Oral Q8H PRN   1 tablet at 03/23/23 1053    carboxymethylcellulose (PF) (REFRESH PLUS) 0.5 % ophthalmic solution 1 drop  1 drop Both Eyes TID PRN        cetirizine  (ZyrTEC ) tablet 10 mg  10 mg Oral Daily   10 mg at 03/23/23 0907    dexAMETHasone  (DECADRON ) 10 mg in sodium chloride  0.9 % 50 mL IVPB  10 mg Intravenous Once        dextrose  (GLUCOSE) 40 % oral gel 15 g of glucose  15 g of glucose Oral PRN        Or    dextrose  (D10W) 10% bolus 125 mL  12.5 g Intravenous PRN        Or    dextrose  50 % bolus 12.5 g  12.5 g Intravenous PRN        Or    glucagon  (rDNA) (GLUCAGEN) injection 1 mg  1 mg Intramuscular PRN        enoxaparin  (LOVENOX ) syringe 40 mg  40 mg Subcutaneous Daily   40 mg at 03/23/23 0849    insulin  lispro injection 1-4 Units  1-4 Units Subcutaneous QHS        insulin  lispro injection 1-8 Units  1-8 Units Subcutaneous TID AC        ketorolac  (TORADOL ) injection 30 mg  30 mg Intravenous 4 times per day        magnesium  sulfate 1g in dextrose  5% 100mL IVPB (premix)  1 g Intravenous PRN   Stopped at 03/23/23 1322    magnesium  sulfate 1g in dextrose  5% 100mL IVPB (premix)  1 g Intravenous Q12H        melatonin tablet 3 mg  3 mg Oral QHS PRN        metoclopramide  (REGLAN ) injection 5 mg  5 mg Intravenous 4 times per day   5 mg at 03/23/23 1217    montelukast  (SINGULAIR ) tablet 10 mg  10 mg Oral QHS   10 mg at 03/22/23 2048    morphine  injection 2 mg  2 mg Intravenous Q4H PRN   2 mg at 03/23/23 0845    naloxone  (NARCAN ) injection 0.2 mg  0.2 mg Intravenous PRN        ondansetron  (ZOFRAN ) tablet 4 mg  4 mg Oral Q8H PRN        Or    ondansetron  (ZOFRAN ) injection 4 mg  4 mg Intravenous Q8H PRN   4 mg at 03/23/23 1100    pantoprazole  (PROTONIX ) injection 40 mg  40 mg Intravenous Q12H SCH    40 mg at 03/23/23 0849    polycarbophil (FIBERCON) tablet 625 mg  625 mg Oral BID        potassium & sodium  phosphates (PHOS-NAK) 280-160-250 MG packet 2 packet  2 packet Oral PRN        potassium chloride  (KLOR-CON  M20) CR tablet 0-60 mEq  0-60 mEq Oral PRN   20 mEq at 03/23/23 0732    Or    potassium chloride  (KLOR-CON ) packet 0-60 mEq  0-60 mEq Oral PRN        Or    potassium chloride  10 mEq in 100 mL IVPB (premix)  10 mEq Intravenous PRN        saline (OCEAN NASAL SPRAY) 0.65 % nasal solution 2 spray  2 spray Each Nare Q4H PRN        scopolamine  (TRANSDERM-SCOP) patch 1 mg/72 hrs 1 patch  1 patch Transdermal Q72H   1 patch at 03/21/23 1908    senna-docusate (PERICOLACE) 8.6-50 MG per tablet 1 tablet  1 tablet Oral BID   1 tablet at 03/23/23 [phone]

## 2023-03-23 NOTE — Progress Notes (Signed)
 Patient offers that she has applied for Medicaid but after applying she was kicked out of her apartment because she could not pay the rent. She asked her landlord if she had received any correspondence from Medicaid and the landlord told her that if she paid the landlord $150 for past rent (1 week rent) the landlord would give the patient her mail. Patient does not know whether she was approved or not. Patient had not been able to get her prescriptions filled because she cannot afford to pay out of pocket.    CM will continue to follow for discharge needs/planning.     03/23/23 1153   Patient Type   Within 30 Days of Previous Admission? No   Healthcare Decisions   Interviewed: Patient   Orientation/Decision Making Abilities of Patient Alert and Oriented x3, able to make decisions   Advance Directive Patient does not have advance directive   Prior to admission   Prior level of function Independent with ADLs;Ambulates independently   Type of Residence Private residence  [phone])   Home Layout One level   Have running water , electricity, heat, etc? Yes   Living Arrangements Children  (Lives in a one bedroom apartmet with her son, DIL, 3 grandchild, 2nd son & his SO, and a dtr and her boyfriend.)   How do you get to your MD appointments? family, has applied for Medicaid   How do you get your groceries? family-doordash   Who fixes your meals? everyone pitches in   Who does your laundry? everyone pitches in   Who picks up your prescriptions? family, Patient has not had her medications because she cannot afford   Dressing Independent  (when feeling well)   Grooming Independent   Feeding Independent   Bathing Independent  (when feeling well)   Toileting Independent  (when feeling well)   DME Currently at Home   (none)   Home Care/Community Services None   Prior SNF admission? (Detail) NA   Prior Rehab admission? (Detail) NA   Discharge Planning   Support Systems Children;Family members    Patient expects to be discharged to: home   Anticipated [place] plan discussed with: Patient   Mode of transportation: Private car (family member)   Programmer, [employer]   How hard is it for you to pay for the very basics like food, housing, medical care, and heating? Very Hard   Housing Stability   In the last 12 months, was there a time when you were not able to pay the mortgage or rent on time? Y   In the past 12 months, how many times have you moved where you were living? 5   At any time in the past 12 months, were you homeless or living in a shelter (including now)? N   Transportation Needs   In the past 12 months, has lack of transportation kept you from medical appointments or from getting medications? yes   In the past 12 months, has lack of transportation kept you from meetings, work, or from getting things needed for daily living? Yes   Consults/Providers   PT Evaluation Needed 1   OT Evalulation Needed 1   SLP Evaluation Needed 2   Correct PCP listed in Epic? Yes  [facility] in [place])   Family and PCP   PCP on file was verified as the current PCP? Yes   In case you are admitted, transferred or discharged, would like family notified? Yes  Name of family member to be notified Reale,Sanros Dorn (Son)  [phone] [facility])   In case you are admitted, transferred or discharged, would like your PCP notified? Yes     Richerd Lehmann MSN, RN, CCM   [facility] - Remote Case Manager
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
## 2023-03-23 NOTE — Progress Notes (Signed)
 Remote IDPA attempted.   Contacted patient via room # [phone]. Patient requested a return call at a later time stating, not right now I am in big pain.   Will return call later today.     Richerd Lehmann MSN, RN, CCM   [facility] - Remote Case Manager

## 2023-03-24 ENCOUNTER — Inpatient Hospital Stay: Payer: MEDICAID

## 2023-03-24 ENCOUNTER — Other Ambulatory Visit: Payer: Self-pay

## 2023-03-24 DIAGNOSIS — R1084 Generalized abdominal pain: Secondary | ICD-10-CM

## 2023-03-24 DIAGNOSIS — R110 Nausea: Secondary | ICD-10-CM

## 2023-03-24 LAB — BASIC METABOLIC PANEL
Anion Gap: 10 (ref 5.0–15.0)
BUN: 7 mg/dL (ref 7–21)
CO2: 28 meq/L (ref 17–29)
Calcium: 9.2 mg/dL (ref 8.5–10.5)
Chloride: 101 meq/L (ref 99–111)
Creatinine: 0.5 mg/dL (ref 0.4–1.0)
GFR: >60 mL/min/{1.73_m2} (ref >=60.0)
Glucose: 132 mg/dL — ABNORMAL HIGH (ref 70–100)
Potassium: 4 meq/L (ref 3.5–5.3)
Sodium: 139 meq/L (ref 135–145)

## 2023-03-24 LAB — LAB USE ONLY - CBC WITH DIFFERENTIAL
Absolute Basophils: 0.02 10*3/uL (ref 0.00–0.08)
Absolute Eosinophils: 0.01 10*3/uL (ref 0.00–0.44)
Absolute Immature Granulocytes: 0.03 10*3/uL (ref 0.00–0.07)
Absolute Lymphocytes: 0.84 10*3/uL (ref 0.42–3.22)
Absolute Monocytes: 0.4 10*3/uL (ref 0.21–0.85)
Absolute Neutrophils: 3.9 10*3/uL (ref 1.10–6.33)
Absolute nRBC: 0 10*3/uL (ref <=0.00)
Basophils %: 0.4 %
Eosinophils %: 0.2 %
Hematocrit: 43.8 % — ABNORMAL HIGH (ref 34.7–43.7)
Hemoglobin: 14.1 g/dL (ref 11.4–14.8)
Immature Granulocytes %: 0.6 %
Lymphocytes %: 16.2 %
MCH: 29.7 pg (ref 25.1–33.5)
MCHC: 32.2 g/dL (ref 31.5–35.8)
MCV: 92.4 fL (ref 78.0–96.0)
MPV: 9.4 fL (ref 8.9–12.5)
Monocytes %: 7.7 %
Neutrophils %: 74.9 %
Platelet Count: 308 10*3/uL (ref 142–346)
Preliminary Absolute Neutrophil Count: 3.9 10*3/uL (ref 1.10–6.33)
RBC: 4.74 10*6/uL (ref 3.90–5.10)
RDW: 12 % (ref 11–15)
WBC: 5.2 10*3/uL (ref 3.10–9.50)
nRBC %: 0 /100{WBCs} (ref <=0.0)

## 2023-03-24 LAB — HEPATIC FUNCTION PANEL (LFT)
ALT: 92 U/L — ABNORMAL HIGH (ref <=55)
AST (SGOT): 36 U/L (ref <=41)
Albumin/Globulin Ratio: 1.1 (ref 0.9–2.2)
Albumin: 3.6 g/dL (ref 3.5–5.0)
Alkaline Phosphatase: 104 U/L (ref 37–117)
Bilirubin Direct: 0.1 mg/dL (ref 0.0–0.5)
Bilirubin Indirect: 0.2 mg/dL (ref 0.2–1.0)
Bilirubin, Total: 0.3 mg/dL (ref 0.2–1.2)
Globulin: 3.3 g/dL (ref 2.0–3.6)
Protein, Total: 6.9 g/dL (ref 6.0–8.3)

## 2023-03-24 LAB — WHOLE BLOOD GLUCOSE POCT
Whole Blood Glucose POCT: 106 mg/dL — ABNORMAL HIGH (ref 70–100)
Whole Blood Glucose POCT: 101 mg/dL — ABNORMAL HIGH (ref 70–100)
Whole Blood Glucose POCT: 121 mg/dL — ABNORMAL HIGH (ref 70–100)

## 2023-03-24 LAB — MAGNESIUM: Magnesium: 2.9 mg/dL — ABNORMAL HIGH (ref 1.6–2.6)

## 2023-03-24 MED ORDER — GADOTERATE MEGLUMINE 10 MMOL/20ML IV SOLN (CLARISCAN)
16.0000 mL | Freq: Once | INTRAVENOUS | Status: AC | PRN
Start: 2023-03-24 — End: 2023-03-24
  Administered 2023-03-24: 16 mL via INTRAVENOUS

## 2023-03-24 NOTE — Plan of Care (Signed)
 Nursing Progress Note: VSS. During the beginning of the shift pt stated she was hungry. After meal tray was ordered. Pt tolerated oral intake well. Weaned pt off oxygen to 1L. Around 5 am pt stated she was nauseous. Zofran  was given once.     Neuro: A&Ox 4  GI: +flatus, LBM PTA (4 days ago per pt)  GU: voiding via ext cath and bathroom   Cardiac: HR regular  Pulm: On 1L NC, no c/o chest pain or SOB  Integumentary: scattered tattoos/bruising  Pain: Pain managed with scheduled meds and morphine   Mobility: SBA  Safety: Pt is a mod fall risk. Fall precautions in place: bed in lowest position, fall mat in place, bed alarm on, call light is within reach.  Active lines: PIV 22G diffusix L AC-SL, PIV 20G RF-SL     POC:   -pain mgmt  -monitor N/V  -NPO 4hr for MRI in the morning    Problem: Pain interferes with ability to perform ADL  Goal: Pain at adequate level as identified by patient  Outcome: Progressing     Problem: Side Effects from Pain Analgesia  Goal: Patient will experience minimal side effects of analgesic therapy  Outcome: Progressing     Problem: Day of Admission - Heart Failure  Goal: Heart Failure Admission  Outcome: Progressing     Problem: Everyday - Heart Failure  Goal: Stable Vital Signs and Fluid Balance  Outcome: Progressing  Goal: Mobility/Activity is Maintained at Optimal Level for Patient  Outcome: Progressing  Goal: Nutritional Intake is Adequate  Outcome: Progressing  Goal: Teaching-Using CHF Warning Zones and Educational Videos  Outcome: Progressing

## 2023-03-24 NOTE — Progress Notes (Signed)
 PROGRESS NOTE  Gastroenterology Consult Service - [facility]  Epic Chat (Group): [facility]  [address], [address] [place], [place] [address]  Appointments: [phone]    Date Time: 03/24/23 7:36 AM  Patient Name: Valerie May D  Requesting Physician: Seldon Adine DEL, MD      Chief Complaint:   Pancreatic head lesion  Abdominal pain   Nausea     Assessment and Plan:   Assessment:  53 y.o. 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  Acute elevation in LFTs, mild and improving   MRCP with mild prominence of tapering toward the ampulla but likely physiologic in setting of cholecystectomy   Acetaminophen  level undetectable and utox only +opiates  Nausea with vomiting x 1.5 mo, progressive, not tolerating any PO - could be related to above but broad ddx including gastric outlet obstruction, gastroparesis, PUD/gastritis, other  R sided and epigastric abd pain - consider related to pancreatic mass though may also be related to constipation, gastritis, PUD   Heartburn d/t vomiting most likely  Chronic constipation, possibly dysmotility vs pelvic floor dysfunction  Leukocytosis on admission, resolved  Acute hypercapnic respiratory failure likely related to opioids, resolved  DM2, A1c 6.0    Plan:  1. Will plan for EGD and EUS tomorrow 2/26 for further evaluation  2. NPO after midnight   3. Hold 2/26 AM dose of lovenox   4. During initial consult, a colonoscopy was also discussed,however, she has ongoing nausea with difficulty tolerating small amounts of PO intake so will hold off at this time    5. Monitor LFTs daily   6. Antiemetics and pain control per primary team       Preparation for endoscopy: (criteria for safe sedation)  - K > 3.2 and < 5.5  - Na > 126 and < 145  - HgB > 7, and > 8 for cardiac history  - Oxygen saturation of 95% on 3 liters of oxygen or less  - platelets >50  - INR 1.5-1.7 pending procedure      I spent a total of 31 minutes with the patient today which includes review of records, discussion with nurses or other providers, face-to-face with the patient, counseling and coordinating care, any family discussion, and documentation of this encounter.  This time is minus any separate billable procedures that may have been performed.     Case has been reviewed and discussed with the GI attending, Dr. Carole Rice, MD, and plan of care formulated together.    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 This lesion appears confined of the pancreas and demonstrates no major vascular interfaces. Recommend EUS for further evaluation.  2.No findings suspicious for metastatic disease within the imaged abdomen.  3.Mid right renal 1.7 cm cyst, recently demonstrating intermediate density by CT and is benign.  4.Hepatic steatosis.  5.Colonic diverticulosis.    CT A/P with IV contrast 03/21/23:  1.No acute abnormality within the chest, abdomen, or pelvis.  2.No findings of acute pulmonary embolism within the  limitations stated above.  3.Hyperdense pancreatic head lesion. Further evaluation with MRI/MRCP is recommended.  4.Mildly hyperdense right renal lesion, possible hemorrhagic/proteinaceous cyst. This can also be further evaluated on MRI.  5.Additional chronic findings as above.      Endoscopy:     No prior endoscopic evaluation available for review       Subjective:   Pain is well controlled with medication though does have recurrence  Nausea still present and not  tolerating much PO intake; no vomiting   Still feeling constipated     Medications:     Current Facility-Administered Medications   Medication Dose Route Frequency    aspirin  EC  81 mg Oral Daily    bisacodyl   10 mg Oral Daily    budesonide -formoterol   2 puff Inhalation BID    cetirizine   10 mg Oral Daily    enoxaparin   40 mg Subcutaneous Daily    insulin  lispro  1-4 Units Subcutaneous QHS    insulin  lispro  1-8 Units Subcutaneous TID AC    ketorolac   30 mg Intravenous 4 times per day    magnesium  sulfate  1 g Intravenous Q12H    metoclopramide   5 mg Intravenous 4 times per day    montelukast   10 mg Oral QHS    pantoprazole   40 mg Intravenous Q12H SCH    polycarbophil  625 mg Oral BID    scopolamine   1 patch Transdermal Q72H    senna-docusate  1 tablet Oral BID       Review of Systems:   General:  +lack of appetite   HEENT:  Patient denies headache, hoarseness   Cardiovascular:  Patient denies swelling of hands/feet, fainting/blacking out, chest pain.   Respiratory:  Patient denies chronic cough, difficulty breathing, wheezing.   Genitourinary:  Patient denies blood in urine, dark urine  Musculoskeletal: Patient denies joint pain, joint stiffness, joint swelling.   Skin:  Patient denies itching, rash.   Neurologic:  Patient denies dizziness, loss of consciousness, fainting, confusion  Heme/Lymphatic:  Patient denies easy bruising.       Pertinent positives noted in HPI.    Physical Exam:     Vitals:    03/24/23 0359   BP:    Pulse: 88   Resp:    Temp:    SpO2: 95%       General appearance: Appears stated age and in NAD  Eyes: Sclera anicteric, pink conjunctivae, no ptosis  ENMT: mucous membranes moist, nose and ears appear normal.  Oropharynx clear.  Chest: Non labored respirations, no audible wheezing, no clubbing or cyanosis  CV:  Regular rate and rhythm, no JVD, no LE edema  Abdomen: soft, non-tender, non-distended, no masses or organomegaly. Negative fluid wave.  Skin: No pallor, no jaundice, Normal color and  turgor, no rashes, no suspicious skin lesions noted  Neuro:  No gross movement disorders noted. No asterixis.  Mental status: Appropriate affect, alert and oriented x 3      Labs:     Recent Labs     03/24/23  0423 03/23/23  0330   WBC 5.20 6.62   Hemoglobin 14.1 12.8   Hematocrit 43.8* 40.4   Platelet Count 308 250   MCV 92.4 93.7       Recent Labs     03/24/23  0423 03/23/23  0330   Sodium 139 139   Potassium 4.0 3.8   Chloride 101 104   CO2 28 30*   BUN 7 5*  Creatinine 0.5 0.5   Glucose 132* 107*   Calcium  9.2 8.5   Magnesium  2.9* 1.9       Recent Labs     03/24/23  0423 03/23/23  0330   AST (SGOT) 36 58*   ALT 92* 110*   Alkaline Phosphatase 104 99   Bilirubin, Total 0.3 0.4   Bilirubin Direct 0.1 0.2   Protein, Total 6.9 5.9*   Albumin 3.6 3.2*       Recent Labs     03/22/23  1441   PTT 30   PT 11.8   INR 1.0            Jim Lundin R Kista Robb, PA

## 2023-03-24 NOTE — PT Progress Note (Signed)
 [facility]   Physical Therapy Cancellation Note      Patient:  Valerie May MRN#:  [id number]  Unit:  [facility] Room/Bed:  T671/T671.98    03/24/2023  Time: 1156am      PT Cancellation: Visit  PT Visit Cancellation Reason: Patient/caregiver declines therapy at this time (tearful after conversation w/ MD. Reports not being in a good headspace declining mobility for today)     Will continue to monitor & return as schedule permits.    Dania Bring, PT, DPT, Pager [phone], 03/24/2023, 11:57 AM

## 2023-03-24 NOTE — Plan of Care (Signed)
 Neuro: A/Ox4,   Respiratory: Lung sounds diminished; patient denies SOB  Cardio: No tele, patient denies chest pain;   GU: Voiding via ex cath and freely   GI: Regular diet- small and bite size, NPO at midnight due to EGD and EUS tomorrow, +N- Zofran , Reglan , scopolamine /-V; +flatus, LBM PTA;  Pain control: Pain managed w/ PRN morphine   Mobility: x1 minimal assistance   Safety: Safety precautions maintained and call bell within reach.     Problem: Pain interferes with ability to perform ADL  Goal: Pain at adequate level as identified by patient  03/24/2023 1354 by Ladora Grate, RN  Outcome: Progressing  Flowsheets (Taken 03/24/2023 1354)  Pain at adequate level as identified by patient:   Identify patient comfort function goal   Assess for risk of opioid induced respiratory depression, including snoring/sleep apnea. Alert healthcare team of risk factors identified.   Assess pain on admission, during daily assessment and/or before any as needed intervention(s)   Reassess pain within 30-60 minutes of any procedure/intervention, per Pain Assessment, Intervention, Reassessment (AIR) Cycle   Evaluate if patient comfort function goal is met   Evaluate patient's satisfaction with pain management progress   Offer non-pharmacological pain management interventions  03/24/2023 1349 by Ladora Grate, RN  Outcome: Progressing     Problem: Side Effects from Pain Analgesia  Goal: Patient will experience minimal side effects of analgesic therapy  03/24/2023 1354 by Ladora Grate, RN  Outcome: Progressing  Flowsheets (Taken 03/24/2023 1354)  Patient will experience minimal side effects of analgesic therapy:   Monitor/assess patient's respiratory status (RR depth, effort, breath sounds)   Assess for changes in cognitive function   Prevent/manage side effects per LIP orders (i.e. nausea, vomiting, pruritus, constipation, urinary retention, etc.)  03/24/2023 1349 by Ladora Grate, RN  Outcome: Progressing     Problem: Day of Admission - Heart  Failure  Goal: Heart Failure Admission  03/24/2023 1354 by Ladora Grate, RN  Outcome: Progressing  Flowsheets (Taken 03/24/2023 1354)  Heart Failure Admission:   Strict Intake/Output   Assess for swelling/edema and document  03/24/2023 1349 by Ladora Grate, RN  Outcome: Progressing     Problem: Everyday - Heart Failure  Goal: Stable Vital Signs and Fluid Balance  03/24/2023 1354 by Ladora Grate, RN  Outcome: Progressing  Flowsheets (Taken 03/24/2023 1354)  Stable Vital Signs and Fluid Balance:   Monitor labs and report abnormalities to physician   Strict Intake/Output   Wean oxygen as needed if appropriate   Assess for swelling/edema   Monitor, assess vital signs and telemetry per policy  03/24/2023 1349 by Ladora Grate, RN  Outcome: Progressing  Goal: Mobility/Activity is Maintained at Optimal Level for Patient  03/24/2023 1354 by Ladora Grate, RN  Outcome: Progressing  Flowsheets (Taken 03/24/2023 1354)  Mobility/Activity is Maintained at Optimal Level for Patient: Assess for changes in respiratory status, level of consciousness and/or development of fatigue  03/24/2023 1349 by Ladora Grate, RN  Outcome: Progressing  Goal: Nutritional Intake is Adequate  03/24/2023 1354 by Ladora Grate, RN  Outcome: Progressing  Flowsheets (Taken 03/24/2023 1354)  Nutritional Intake is Adequate: Assess appetite,anorexia and amount of meal/food tolerated  03/24/2023 1349 by Ladora Grate, RN  Outcome: Progressing  Goal: Teaching-Using CHF Warning Zones and Educational Videos  03/24/2023 1354 by Ladora Grate, RN  Outcome: Progressing  03/24/2023 1349 by Ladora Grate, RN  Outcome: Progressing     Problem: Day of Discharge - Heart Failure  Goal: Discharge Education  03/24/2023 1354 by Ladora,  Erman Thum, RN  Outcome: Progressing  03/24/2023 1349 by Ladora Grate, RN  Outcome: Progressing     Problem: Compromised Activity/Mobility  Goal: Activity/Mobility Interventions  03/24/2023 1354 by Ladora Grate, RN  Outcome: Progressing  Flowsheets (Taken 03/24/2023  0845)  Activity/Mobility Interventions: Pad bony prominences, TAP Seated positioning system when OOB, Promote PMP, Reposition q 2 hrs / turn clock, Offload heels  03/24/2023 1349 by Ladora Grate, RN  Outcome: Progressing     Problem: Compromised Sensory Perception  Goal: Sensory Perception Interventions  03/24/2023 1354 by Ladora Grate, RN  Outcome: Progressing  Flowsheets (Taken 03/24/2023 0845)  Sensory Perception Interventions: Offload heels, Pad bony prominences, Reposition q 2hrs/turn Clock, Q2 hour skin assessment under devices if present  03/24/2023 1349 by Ladora Grate, RN  Outcome: Progressing     Problem: Compromised Moisture  Goal: Moisture level Interventions  03/24/2023 1354 by Ladora Grate, RN  Outcome: Progressing  Flowsheets (Taken 03/24/2023 0845)  Moisture level Interventions: Moisture wicking products, Moisture barrier cream  03/24/2023 1349 by Ladora Grate, RN  Outcome: Progressing     Problem: Compromised Nutrition  Goal: Nutrition Interventions  03/24/2023 1354 by Ladora Grate, RN  Outcome: Progressing  Flowsheets (Taken 03/24/2023 0845)  Nutrition Interventions: Discuss nutrition at Rounds, I&Os, Document % meal eaten, Daily weights  03/24/2023 1349 by Ladora Grate, RN  Outcome: Progressing     Problem: Compromised Friction/Shear  Goal: Friction and Shear Interventions  03/24/2023 1354 by Ladora Grate, RN  Outcome: Progressing  Flowsheets (Taken 03/24/2023 0845)  Friction and Shear Interventions: Pad bony prominences, Off load heels, HOB 30 degrees or less unless contraindicated, Consider: TAP seated positioning, Heel foams  03/24/2023 1349 by Ladora Grate, RN  Outcome: Progressing     Problem: Moderate/High Fall Risk Score >5  Goal: Patient will remain free of falls  03/24/2023 1354 by Ladora Grate, RN  Outcome: Progressing  Flowsheets (Taken 03/24/2023 0845)  Moderate Risk (6-13):   MOD-Floor mat at bedside (where available) if appropriate   MOD-Apply bed exit alarm if patient is confused   MOD-Consider  activation of bed alarm if appropriate   LOW-Fall Interventions Appropriate for Low Fall Risk   LOW-Anticoagulation education for injury risk  03/24/2023 1349 by Ladora Grate, RN  Outcome: Progressing
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
## 2023-03-25 ENCOUNTER — Encounter
Admission: EM | Disposition: A | Discharge status: Home / Self Care | Payer: Self-pay | Source: Home / Self Care | Attending: Hospitalist

## 2023-03-25 ENCOUNTER — Inpatient Hospital Stay: Payer: Medicaid Other | Admitting: Anesthesiology

## 2023-03-25 DIAGNOSIS — K8689 Other specified diseases of pancreas: Secondary | ICD-10-CM

## 2023-03-25 DIAGNOSIS — R933 Abnormal findings on diagnostic imaging of other parts of digestive tract: Secondary | ICD-10-CM

## 2023-03-25 HISTORY — PX: ESOPHAGOGASTRODUODENOSCOPY (EGD), ESOPHAGEAL ULTRASOUND (EUS) WITH FINE NEEDLE ASPIRATION/BIOPSY OF ESOPHAGUS, STOMACH, OR DUODENUM: SHX3897

## 2023-03-25 LAB — COMPREHENSIVE METABOLIC PANEL
ALT: 69 U/L — ABNORMAL HIGH (ref <=55)
AST (SGOT): 34 U/L (ref <=41)
Albumin/Globulin Ratio: 1.2 (ref 0.9–2.2)
Albumin: 3.4 g/dL — ABNORMAL LOW (ref 3.5–5.0)
Alkaline Phosphatase: 94 U/L (ref 37–117)
Anion Gap: 10 (ref 5.0–15.0)
BUN: 8 mg/dL (ref 7–21)
Bilirubin, Total: 0.4 mg/dL (ref 0.2–1.2)
CO2: 29 meq/L (ref 17–29)
Calcium: 8.7 mg/dL (ref 8.5–10.5)
Chloride: 103 meq/L (ref 99–111)
Creatinine: 0.6 mg/dL (ref 0.4–1.0)
GFR: >60 mL/min/{1.73_m2} (ref >=60.0)
Globulin: 2.8 g/dL (ref 2.0–3.6)
Glucose: 90 mg/dL (ref 70–100)
Potassium: 3.7 meq/L (ref 3.5–5.3)
Protein, Total: 6.2 g/dL (ref 6.0–8.3)
Sodium: 142 meq/L (ref 135–145)

## 2023-03-25 LAB — PT/INR
INR: 1 (ref 0.9–1.1)
PT: 11 s (ref 10.1–12.9)

## 2023-03-25 LAB — WHOLE BLOOD GLUCOSE POCT
Whole Blood Glucose POCT: 100 mg/dL (ref 70–100)
Whole Blood Glucose POCT: 136 mg/dL — ABNORMAL HIGH (ref 70–100)
Whole Blood Glucose POCT: 146 mg/dL — ABNORMAL HIGH (ref 70–100)
Whole Blood Glucose POCT: 129 mg/dL — ABNORMAL HIGH (ref 70–100)

## 2023-03-25 SURGERY — ESOPHAGOGASTRODUODENOSCOPY (EGD), ESOPHAGEAL ULTRASOUND (EUS) WITH FINE NEEDLE ASPIRATION/BIOPSY OF ESOPHAGUS, STOMACH, OR DUODENUM
Anesthesia: Anesthesia General | Site: Abdomen

## 2023-03-25 MED ORDER — GLYCOPYRROLATE 0.2 MG/ML IJ SOLN (WRAP)
INTRAMUSCULAR | Status: DC | PRN
Start: 2023-03-25 — End: 2023-03-25
  Administered 2023-03-25: 100 ug via INTRAVENOUS

## 2023-03-25 MED ORDER — PROPOFOL 10 MG/ML IV EMUL (WRAP)
INTRAVENOUS | Status: AC
Start: 2023-03-25 — End: ?
  Filled 2023-03-25: qty 50

## 2023-03-25 MED ORDER — LIDOCAINE HCL (PF) 2 % IJ SOLN
INTRAMUSCULAR | Status: AC
Start: 2023-03-25 — End: ?
  Filled 2023-03-25: qty 5

## 2023-03-25 MED ORDER — PROPOFOL INFUSION 10 MG/ML
INTRAVENOUS | Status: DC | PRN
Start: 2023-03-25 — End: 2023-03-25
  Administered 2023-03-25: 120 ug/kg/min via INTRAVENOUS

## 2023-03-25 MED ORDER — PROPOFOL 10 MG/ML IV EMUL (WRAP)
INTRAVENOUS | Status: DC | PRN
Start: 2023-03-25 — End: 2023-03-25
  Administered 2023-03-25: 40 mg via INTRAVENOUS
  Administered 2023-03-25: 110 mg via INTRAVENOUS
  Administered 2023-03-25: 40 mg via INTRAVENOUS

## 2023-03-25 MED ORDER — LACTATED RINGERS IV SOLN
INTRAVENOUS | Status: DC | PRN
Start: 2023-03-25 — End: 2023-03-25

## 2023-03-25 MED ORDER — SUCCINYLCHOLINE CHLORIDE 20 MG/ML IJ SOLN
INTRAMUSCULAR | Status: DC | PRN
Start: 2023-03-25 — End: 2023-03-25
  Administered 2023-03-25: 160 mg via INTRAVENOUS

## 2023-03-25 MED ORDER — ROCURONIUM BROMIDE 50 MG/5ML IV SOLN
INTRAVENOUS | Status: AC
Start: 2023-03-25 — End: ?
  Filled 2023-03-25: qty 5

## 2023-03-25 MED ORDER — ONDANSETRON HCL 4 MG/2ML IJ SOLN
INTRAMUSCULAR | Status: DC | PRN
Start: 2023-03-25 — End: 2023-03-25
  Administered 2023-03-25: 4 mg via INTRAVENOUS

## 2023-03-25 MED ORDER — METOPROLOL TARTRATE 5 MG/5ML IV SOLN
INTRAVENOUS | Status: DC | PRN
Start: 2023-03-25 — End: 2023-03-25
  Administered 2023-03-25: 2 mg via INTRAVENOUS

## 2023-03-25 MED ORDER — PANTOPRAZOLE SODIUM 40 MG PO TBEC
40.0000 mg | DELAYED_RELEASE_TABLET | Freq: Two times a day (BID) | ORAL | Status: DC
Start: 2023-03-25 — End: 2023-04-07
  Administered 2023-03-25 – 2023-04-06 (×23): 40 mg via ORAL
  Filled 2023-03-25 (×25): qty 1

## 2023-03-25 MED ORDER — FLUCONAZOLE 100 MG PO TABS
400.0000 mg | ORAL_TABLET | ORAL | Status: DC
Start: 2023-03-25 — End: 2023-04-07
  Administered 2023-03-25 – 2023-04-05 (×11): 400 mg via ORAL
  Filled 2023-03-25 (×15): qty 4

## 2023-03-25 MED ORDER — MIDAZOLAM HCL 1 MG/ML IJ SOLN (WRAP)
INTRAMUSCULAR | Status: AC
Start: 2023-03-25 — End: ?
  Filled 2023-03-25: qty 2

## 2023-03-25 MED ORDER — SIMETHICONE 40 MG/0.6ML PO SUSP
ORAL | Status: AC
Start: 2023-03-25 — End: ?
  Filled 2023-03-25: qty 30

## 2023-03-25 MED ORDER — IHS MAGIC MOUTHWASH ORAL SUSPENSION (W/O NYSTATIN)
15.0000 mL | Freq: Four times a day (QID) | ORAL | Status: DC
Start: 2023-03-25 — End: 2023-04-06
  Administered 2023-03-25 – 2023-04-05 (×44): 15 mL via ORAL
  Filled 2023-03-25 (×9): qty 90

## 2023-03-25 MED ORDER — PHENYLEPHRINE 100 MCG/ML IV BOLUS (ANESTHESIA)
PREFILLED_SYRINGE | INTRAVENOUS | Status: DC | PRN
Start: 2023-03-25 — End: 2023-03-25
  Administered 2023-03-25: 100 ug via INTRAVENOUS
  Administered 2023-03-25: 50 ug via INTRAVENOUS

## 2023-03-25 MED ORDER — GLYCOPYRROLATE 0.2 MG/ML IJ SOLN (WRAP)
INTRAMUSCULAR | Status: AC
Start: 2023-03-25 — End: ?
  Filled 2023-03-25: qty 1

## 2023-03-25 MED ORDER — LIDOCAINE HCL 2 % IJ SOLN
INTRAMUSCULAR | Status: DC | PRN
Start: 2023-03-25 — End: 2023-03-25
  Administered 2023-03-25: 80 mg

## 2023-03-25 MED ORDER — ONDANSETRON HCL 4 MG/2ML IJ SOLN
INTRAMUSCULAR | Status: AC
Start: 2023-03-25 — End: ?
  Filled 2023-03-25: qty 2

## 2023-03-25 MED ORDER — MIDAZOLAM HCL 1 MG/ML IJ SOLN (WRAP)
INTRAMUSCULAR | Status: DC | PRN
Start: 2023-03-25 — End: 2023-03-25
  Administered 2023-03-25: 2 mg via INTRAVENOUS

## 2023-03-25 SURGICAL SUPPLY — 42 items
BLOCK BITE OD60 FR BLOX (Other) ×1 IMPLANT
BRUSH CYTOLOGY 230CM SHTH BRSTL PSTV RING 3MM 2.1MM CONMED COLONOSCOPE (Brushes) ×1 IMPLANT
BRUSH CYTOLOGY L230 CM SHEATH BRISTLE (Brushes) ×1 IMPLANT
CONNECTOR IRRIGATION AUXILIARY WATER JET (Connector) ×1 IMPLANT
CONNECTOR IRRIGATION AUXILIARY WATER JET 1 WAY VALVE HYDRA (Connector) ×1 IMPLANT
CONTAINER CYTOLOGY CELL FIXATION SELF (Lab Supplies) ×1 IMPLANT
CONTAINER CYTOLOGY CELL FIXATION SELF CONTAINED MODIFIED SACCOMANNO (Lab Supplies) ×1 IMPLANT
CONTAINER HISTOLOGY 60 ML 30 ML GRADUATE LEAK RESISTANT O RING PREFILL (Procedure Accessories) IMPLANT
FORCEPS BIOPSY L240 CM STANDARD CAPACITY (Disposable Instruments) IMPLANT
FORCEPS BIOPSY L240 CM STANDARD CAPACITY NEEDLE OD2.2 MM RADIAL JAW (Disposable Instruments) IMPLANT
GLOVE EXAM LARGE NITRILE CHEMOTHERAPY POWDER FREE SENSE OATMEAL (Glove) ×1 IMPLANT
GLOVE EXAM NITRILE RESTORE LG (Glove) ×1 IMPLANT
GOWN SRG LG SMARTGOWN LF STRL LVL 4 (Gown) ×1 IMPLANT
GOWN SRGCL LG LEVEL 4 BRTHBL STRL LF DSPSBL SMARTGOWN (Gown) ×1 IMPLANT
KIT ENDOSCOPIC COMPLIANCE ENDOKIT (Kits) ×1 IMPLANT
KIT ENDOSCOPIC COMPLIANCE ENDOKIT ORCAPOD 3 1.1 OZ (Kits) ×1 IMPLANT
MANIFOLD SUCTION 2 STANDARD 4 PORT (Filter) ×1 IMPLANT
MANIFOLD SUCTION 2 STANDARD 4 PORT NEPTUNE 2 WASTE MANAGEMENT SYSTEM (Filter) ×1 IMPLANT
NEEDLE ASPIRATION EXPECT OD19 GA (Needles) IMPLANT
NEEDLE ASPIRATION EXPECT OD22 GA (Needles) IMPLANT
NEEDLE ASPIRATION EXPECT OD25 GA (Needles) IMPLANT
NEEDLE ASPIRATION EXPECTâ„¢ OD19 GA ODSEC1.73 MM L8- CM ECHOGENIC (Needles) IMPLANT
NEEDLE ASPIRATION EXPECTâ„¢ OD22 GA ODSEC1.83 MM L8- CM ECHOGENIC (Needles) IMPLANT
NEEDLE ASPIRATION EXPECTâ„¢ OD25 GA ODSEC1.83 MM L8- CM ECHOGENIC (Needles) IMPLANT
NEEDLE BIOPSY OD19 GA ODSEC1.14 MM (Needles) IMPLANT
NEEDLE BIOPSY OD19 GA ODSEC1.14 MM ACQUIRE ULTRASOUND (Needles) IMPLANT
NEEDLE BIOPSY OD22 GA ACQUIRE FINE (Needles) IMPLANT
NEEDLE BIOPSY OD22 GA ACQUIREâ„¢ FINE ENDOSCOPIC ULTRASOUND (Needles) IMPLANT
NEEDLE BIOPSY OD25 GA ACQUIRE (Needles) IMPLANT
NEEDLE BIOPSY OD25 GA ACQUIRE ENDOSCOPIC ULTRASOUND FINE NEEDLE (Needles) IMPLANT
SET CAP 24 HOUR ERBEFLOW TUBING ENDOSCOPY PUMP (Endoscopic Supplies) ×1 IMPLANT
SET TUBING ERBEFLOW STRL CAP 24 HR DISP (Endoscopic Supplies) ×1 IMPLANT
SOL FORMALIN 10% PREFILL 30ML (Procedure Accessories) IMPLANT
SYRINGE 50 ML GRADUATE NONPYROGENIC DEHP (Syringes, Needles) ×1 IMPLANT
SYRINGE 50 ML GRADUATE NONPYROGENIC DEHP FREE PVC FREE BD MEDICAL (Syringes, Needles) ×1 IMPLANT
SYSTEM BIOPSY NEEDLE SHARKCORE FNB OD22 (Endoscopic Supplies) ×1 IMPLANT
SYSTEM BIOPSY NEEDLE SHARKCORE FNB OD22 GA NITINOL (Endoscopic Supplies) ×1 IMPLANT
SYSTEM BIOPSY NEEDLE SHARKCORE FNB OD25 (Needles) IMPLANT
SYSTEM BIOPSY NEEDLE SHARKCORE FNB OD25 GA NITINOL (Needles) IMPLANT
TUBING SUCTION OD3/16 IN L12 FT FEMALE (Tubing) ×1 IMPLANT
TUBING SUCTION OD3/16 IN L12 FT FEMALE CONNECTOR RIBBED UNIVERSAL (Tubing) ×1 IMPLANT
WATER STERILE PVC FREE DEHP FREE 1000 ML (Solution) ×2 IMPLANT

## 2023-03-25 NOTE — Progress Notes (Addendum)
 MEDICINE PROGRESS NOTE    Date Time: 03/25/23 8:14 AM  Patient Name: [facility]  Attending Physician: Jakaila Norment H, MD    Hospital Course/Interim Summary:   DUSTI TETRO is a 53 y.o. female with a history of asthma, eosinophilia, mild bronchiectasis, recurrent pneumonia/bronchitis, migraine headaches, prolonged admit in [place]  for COVID-19 in 2021, sinus disease, GERD, DM2 who presented to [facility] 03/21/2023 with intractable nausea and vomiting, dyspnea, chest abdominal pain, acute on chronic pain in right arm, headache.  Early course complicated by acute respiratory acidosis felt to be due to IV opiates.  An MRI showed concern for pancreatic cancer.  She is undergoing EGD/EUS 2/26.    Assessment:       Pancreatic lesion, concern for neoplasia  Intractable nausea, vomiting  Generalized diffuse pain, headache, abdominal pain, chest pain, unclear etiology  Esophageal candidiasis (based on appearance on scope, follow-up brushing to confirm this is not eosinophilic esophagitis)  Acute metabolic encephalopathy, resolved  Acute hypercapnic and hypoxic respiratory failure  Asthma with bronchiectasis  History of migraines  Mild transaminitis, resolved  Noninfectious SIRS  GERD  NIDDM  Right renal lesion  Class II obesity based on BMI calculation    Plan:   EGD/EUS today, n.p.o. -demonstrated likely esophageal candidiasis.  Will start empirically on fluconazole  400 mg daily for 14 to 21-day course  Monitor for improvement in symptoms with treatment of esophageal candidiasis  Check HIV and hepatitis screen  Follow-up esophageal brushing  Follow-up pathology from EUS biopsy of pancreatic lesion  Analgesia and antiemetics as needed  Outpatient follow-up for right renal lesion    Safety Checklist:   DVT Prophylaxis: Lovenox   Nutrition: Regular diet  Code Status: Full code      Subjective     CC: Generalized abdominal pain    Interval History/24 hour events/HPI/Subjective:   Underwent EGD/EUS with biopsy of pancreatic  lesion  Having throat pain and lip swelling  Wants to try advancing diet with regular diet    Review of Systems:     Negative except per HPI    Physical Exam:     VITAL SIGNS PHYSICAL EXAM   Temp:  [97.4 F (36.3 C)-98.6 F (37 C)] 97.4 F (36.3 C)  Heart Rate:  [88-99] 92  Resp Rate:  [14-18] 14  BP: (113-137)/(63-86) 137/63        Intake/Output Summary (Last 24 hours) at 03/25/2023 0814  Last data filed at 03/25/2023 0513  Gross per 24 hour   Intake 400 ml   Output 1200 ml   Net -800 ml    Physical Exam  General: awake, alert, oriented x 3; no acute distress.  HEENT: perrla, eomi, sclera anicteric oropharynx clear without lesions, mucous membranes moist, edematous upper lip  Neck: supple, no LAD, no thyromegaly, no JVD, no carotid bruits  Cardiovascular: regular rate and rhythm, no murmurs, rubs, or gallops  Lungs: clear to auscultation bilaterally, without wheezing, rhonchi, or rales  Abdomen: soft, non-tender, non-distended; no palpable masses, no hepatosplenomegaly, normoactive bowel sounds, no rebound or guarding  Extremities: no clubbing, cyanosis, or edema  Neuro: cranial nerves grossly intact, strength 5/5 in upper and lower extremities, sensation intact  Skin: no rashes or lesions noted         Meds:     Medications were reviewed:    Labs:     Labs (last 72 hours):    Recent Labs   Lab 03/24/23  0423 03/23/23  0330   WBC 5.20 6.62  Hemoglobin 14.1 12.8   Hematocrit 43.8* 40.4   Platelet Count 308 250       Recent Labs   Lab 03/25/23  0318 03/22/23  1441   PT 11.0 11.8   INR 1.0 1.0   PTT  --  30       Microbiology, reviewed and are significant for:  Microbiology Results (last 15 days)       Procedure Component Value Units Date/Time    Respiratory Pathogen Panel With COVID-19, PCR [[id number]]     Order Status: Sent Specimen: Nasopharyngeal Swab from Nasopharynx     COVID-19 and Influenza (Liat) (symptomatic) [[id number]]  (Normal) Collected: 03/21/23 0532    Order Status: Completed Specimen: Swab from  Anterior Nares Updated: 03/21/23 0646     SARS-CoV-2 (COVID-19) RNA Not Detected     Influenza A RNA Not Detected     Influenza B RNA Not Detected    Narrative:      A result of Detected indicates POSITIVE for the presence of viral RNA  A result of Not Detected indicates NEGATIVE for the presence of viral RNA    Test performed using the Roche cobas Liat SARS-CoV-2 & Influenza A/B assay. This is a multiplex real-time RT-PCR assay for the detection of SARS-CoV-2, influenza A, and influenza B virus RNA. Viral nucleic acids may persist in vivo, independent of viability. Detection of viral nucleic acid does not imply the presence of infectious virus, or that virus nucleic acid is the cause of clinical symptoms. Negative results do not preclude SARS-CoV-2, influenza A, and/or influenza B infection and should not be used as the sole basis for diagnosis, treatment or other patient management decisions. Invalid results may be due to inhibiting substances in the specimen and recollection should occur.     Culture, Blood, Aerobic And Anaerobic [[id number]] Collected: 03/21/23 0506    Order Status: Completed Specimen: Blood, Venous Updated: 03/24/23 0900     Culture Blood No growth at 3 days              Imaging, reviewed and are significant for:  MRI Abdomen W WO Contrast MRCP    Result Date: 03/24/2023   Exam quality degraded by artifact arising from motion. The following observations are made within these confines. 1.Pancreatic head 1.2 cm enhancing lesion, suspected neoplasm. This lesion appears confined of the pancreas and demonstrates no major vascular interfaces. Recommend EUS for further evaluation. 2.No findings suspicious for metastatic disease within the imaged abdomen. 3.Mid right renal 1.7 cm cyst, recently demonstrating intermediate density by CT and is benign. 4.Hepatic steatosis. 5.Colonic diverticulosis. Elwood Miyamoto, MD 03/24/2023 8:49 AM    CT Head WO Contrast    Result Date: 03/22/2023   No acute  intracranial abnormality. Debby ONEIDA Sewer, MD 03/22/2023 7:28 PM    Shoulder Right 2+ Views    Result Date: 03/21/2023  1. No fracture of the shoulder. 2. Old healed fractures of the right lateral third and fourth ribs. Derick Clap, MD 03/21/2023 2:09 PM    CT Abd/Pelvis with IV Contrast only    Result Date: 03/21/2023  1.No acute abnormality within the chest, abdomen, or pelvis. 2.No findings of acute pulmonary embolism within the limitations stated above. 3.Hyperdense pancreatic head lesion. Further evaluation with MRI/MRCP is recommended. 4.Mildly hyperdense right renal lesion, possible hemorrhagic/proteinaceous cyst. This can also be further evaluated on MRI. 5.Additional chronic findings as above. Sue CHRISTELLA Fila, DO 03/21/2023 8:51 AM    CT Angio Chest (PE study)  Result Date: 03/21/2023  1.No acute abnormality within the chest, abdomen, or pelvis. 2.No findings of acute pulmonary embolism within the limitations stated above. 3.Hyperdense pancreatic head lesion. Further evaluation with MRI/MRCP is recommended. 4.Mildly hyperdense right renal lesion, possible hemorrhagic/proteinaceous cyst. This can also be further evaluated on MRI. 5.Additional chronic findings as above. Sue CHRISTELLA Fila, DO 03/21/2023 8:51 AM    Chest AP Portable    Result Date: 03/21/2023  No acute pulmonary process. Odella Edelson, MD 03/21/2023 5:31 AM    Recent Labs   Lab 03/25/23  0318 03/24/23  0423   Sodium 142 139   Potassium 3.7 4.0   Chloride 103 101   CO2 29 28   BUN 8 7   Creatinine 0.6 0.5   Calcium  8.7 9.2   Albumin 3.4* 3.6   Protein, Total 6.2 6.9   Bilirubin, Total 0.4 0.3   Alkaline Phosphatase 94 104   ALT 69* 92*   AST (SGOT) 34 36   Glucose 90 132*        Signed by: Adine VEAR Fort, MD

## 2023-03-25 NOTE — Anesthesia Postprocedure Evaluation (Addendum)
 Anesthesia Post Evaluation    Patient: Valerie May    ESOPHAGOGASTRODUODENOSCOPY (EGD), ESOPHAGEAL ULTRASOUND (EUS) WITH FINE NEEDLE ASPIRATION/BIOPSY OF ESOPHAGUS, STOMACH, OR DUODENUM (Abdomen)    Anesthesia type: general    Last Vitals:   Vitals Value Taken Time   BP 121/74 03/25/23 1138   Temp 36.9 C (98.4 F) 03/25/23 1138   Pulse 102 03/25/23 1138   Resp 17 03/25/23 1138   SpO2 80 % 03/25/23 1138                 Anesthesia Post Evaluation:     Patient Evaluated: PACU  Patient Participation: complete - patient participated  Level of Consciousness: awake and alert    Pain Management: adequate  Multimodal analgesia pain management approach  Strategies: acetaminophen  and local anesthesia  Airway Patency: patent  Two or more mitigation strategies used for obstructive sleep apnea.  Strategies: awake extubation, multimodal analgesia, intraop administration of CPAP/nasophargyneal airway/oral appliance and verification of full NMB reversal    Anesthetic complications: No          Cardiovascular status: acceptable  Respiratory status: acceptable  Hydration status: acceptable  Comments: SaO2 92-96%      Signed by: Redell DELENA Bless, MD, 03/25/2023 11:41 AM

## 2023-03-25 NOTE — H&P (Signed)
 GI PRE PROCEDURE NOTE    Proceduralist Comments:   Review of Systems and Past Medical / Surgical History performed: Yes     Indications: pancreatic head mass    Previous Adverse Reaction to Anesthesia or Sedation (if yes, describe): No    Physical Exam / Laboratory Data (If applicable)   Airway Classification: Class III    General: Alert and cooperative  Lungs: Lungs clear to auscultation  Cardiac: RRR, normal S1S2.    Abdomen: Soft, non tender. Normal active bowel sounds  Other:     Recent CBC No results for input(s): HGB, HCT, PLT in the last 24 hours.    Invalid input(s): WBCIR  Recent CMP   Recent Labs     03/25/23  0318   Glucose 90   BUN 8   Creatinine 0.6   Sodium 142   Chloride 103   CO2 29   Calcium  8.7   Albumin 3.4*   AST (SGOT) 34   ALT 69*   Globulin 2.8     Recent PT/INR   Recent Labs     03/25/23  0318   INR 1.0       American Society of Anesthesiologists (ASA) Physical Status Classification:   ASA 3 - Patient with moderate systemic disease with functional limitations    Planned Sedation:   Deep sedation with anesthesia    Attestation:   Valerie May has been reassessed immediately prior to the procedure and is an appropriate candidate for the planned sedation and procedure. Risks, benefits and alternatives to the planned procedure and sedation have been explained to the patient or guardian:  yes        Signed by: Carole CHRISTELLA Rice, MD

## 2023-03-25 NOTE — Plan of Care (Signed)
 Nursing Progress Note: VSS. No acute events this shift. K+ 40 mEq given. Around 6 am [facility] called stating pt is scheduled for 8:30 and they will send transport around change of shift.     Neuro: A&Ox 4  GI: +flatus, LBM 2/26 (x2 small, yellow per pt)  GU: voiding  Cardiac: HR regular  Pulm: On RA, no c/o chest pain or SOB  Integumentary: scattered bruising/tattoos  Pain: Pain managed with morphine ,   Mobility: SBA  Safety: Pt is a mod fall risk. Fall precautions in place: bed in lowest position, fall mat in place, bed alarm on, call light is within reach.  Active lines: PIV LF removed due to pt complaining of pain. PIV L AC-SL     POC:   -NPO since midnight for EGD/EUS  -Pain mgmt   -wean off oxygen   -aniemetics   -PT/ OT following   -GI following   -Hold 2/26 AM dose of lovenox      Problem: Pain interferes with ability to perform ADL  Goal: Pain at adequate level as identified by patient  Outcome: Progressing     Problem: Side Effects from Pain Analgesia  Goal: Patient will experience minimal side effects of analgesic therapy  Outcome: Progressing     Problem: Day of Admission - Heart Failure  Goal: Heart Failure Admission  Outcome: Progressing     Problem: Everyday - Heart Failure  Goal: Stable Vital Signs and Fluid Balance  Outcome: Progressing  Goal: Mobility/Activity is Maintained at Optimal Level for Patient  Outcome: Progressing  Goal: Nutritional Intake is Adequate  Outcome: Progressing  Goal: Teaching-Using CHF Warning Zones and Educational Videos  Outcome: Progressing

## 2023-03-25 NOTE — Plan of Care (Signed)
 Went off unit for EGD/EUS  Neuro: A/Ox4  Respiratory: Lung sounds diminished; patient denies SOB; uses CPAP @ night  Cardio: No tele, patient denies chest pain;   GU: Voiding freely ;   GI: Regular diet, +N- PRN Zofran  and scopolamine  patch (R ear); -V; +flatus, LBM 2/25; per patient  Pain control: Pain managed with PRN morphine .  Mobility: x1 minimal assistance w/ walker  Safety: Safety precautions maintained and call bell within reach.    Problem: Pain interferes with ability to perform ADL  Goal: Pain at adequate level as identified by patient  Outcome: Progressing  Flowsheets (Taken 03/24/2023 1354)  Pain at adequate level as identified by patient:   Identify patient comfort function goal   Assess for risk of opioid induced respiratory depression, including snoring/sleep apnea. Alert healthcare team of risk factors identified.   Assess pain on admission, during daily assessment and/or before any as needed intervention(s)   Reassess pain within 30-60 minutes of any procedure/intervention, per Pain Assessment, Intervention, Reassessment (AIR) Cycle   Evaluate if patient comfort function goal is met   Evaluate patient's satisfaction with pain management progress   Offer non-pharmacological pain management interventions     Problem: Side Effects from Pain Analgesia  Goal: Patient will experience minimal side effects of analgesic therapy  Outcome: Progressing  Flowsheets (Taken 03/24/2023 1354)  Patient will experience minimal side effects of analgesic therapy:   Monitor/assess patient's respiratory status (RR depth, effort, breath sounds)   Assess for changes in cognitive function   Prevent/manage side effects per LIP orders (i.e. nausea, vomiting, pruritus, constipation, urinary retention, etc.)     Problem: Day of Admission - Heart Failure  Goal: Heart Failure Admission  Outcome: Progressing     Problem: Everyday - Heart Failure  Goal: Stable Vital Signs and Fluid Balance  Outcome: Progressing  Goal:  Mobility/Activity is Maintained at Optimal Level for Patient  Outcome: Progressing  Goal: Nutritional Intake is Adequate  Outcome: Progressing  Goal: Teaching-Using CHF Warning Zones and Educational Videos  Outcome: Progressing     Problem: Day of Discharge - Heart Failure  Goal: Discharge Education  Outcome: Progressing     Problem: Compromised Activity/Mobility  Goal: Activity/Mobility Interventions  Outcome: Progressing  Flowsheets (Taken 03/25/2023 1030)  Activity/Mobility Interventions: Pad bony prominences, TAP Seated positioning system when OOB, Promote PMP, Reposition q 2 hrs / turn clock, Offload heels     Problem: Compromised Sensory Perception  Goal: Sensory Perception Interventions  Outcome: Progressing  Flowsheets (Taken 03/25/2023 1030)  Sensory Perception Interventions: Offload heels, Pad bony prominences, Reposition q 2hrs/turn Clock, Q2 hour skin assessment under devices if present     Problem: Compromised Moisture  Goal: Moisture level Interventions  Outcome: Progressing  Flowsheets (Taken 03/25/2023 1030)  Moisture level Interventions: Moisture wicking products, Moisture barrier cream     Problem: Compromised Nutrition  Goal: Nutrition Interventions  Outcome: Progressing  Flowsheets (Taken 03/25/2023 1030)  Nutrition Interventions: Discuss nutrition at Rounds, I&Os, Document % meal eaten, Daily weights     Problem: Compromised Friction/Shear  Goal: Friction and Shear Interventions  Outcome: Progressing  Flowsheets (Taken 03/25/2023 1030)  Friction and Shear Interventions: Pad bony prominences, Off load heels, HOB 30 degrees or less unless contraindicated, Consider: TAP seated positioning, Heel foams     Problem: Moderate/High Fall Risk Score >5  Goal: Patient will remain free of falls  Outcome: Progressing

## 2023-03-25 NOTE — Plan of Care (Signed)
 GI Post-Procedure Note:    Procedure completed. Please review proVation note for details. EGD revealed esophageal candidiasis. Brush cytology obtained. Mild gastritis. 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 FNA biopsy was obtained. Recommend following esophageal brushing and treat for candidiasis, Protonix  40 mg po daily x 2 weeks, follow up with FNA of the pancreas mass. Outpatient GI follow up. Ok to resume diet. Call us  back with questions or new developments. GI service will sign-off.     Carole CHRISTELLA Rice, MD

## 2023-03-25 NOTE — Anesthesia Preprocedure Evaluation (Addendum)
 Anesthesia Evaluation    AIRWAY    Mallampati: III    TM distance: >3 FB  Neck ROM: full  Mouth Opening:full   CARDIOVASCULAR    regular and normal       DENTAL    no notable dental hx               PULMONARY    clear to auscultation     OTHER FINDINGS    Allergies:   -- Benadryl  [Diphenhydramine ]    -- Dust Mite Extract    -- Methocarbamol -- Itching   -- Molds & Smuts    -- Other    -- Compazine [Prochlorperazine] -- Anxiety  All Rx:  Scheduled Meds:Current Facility-Administered Medications:  aspirin  EC, 81 mg, Oral, Daily  bisacodyl , 10 mg, Oral, Daily  budesonide -formoterol , 2 puff, Inhalation, BID  cetirizine , 10 mg, Oral, Daily  [Held by provider] enoxaparin , 40 mg, Subcutaneous, Daily  insulin  lispro, 1-4 Units, Subcutaneous, QHS  insulin  lispro, 1-8 Units, Subcutaneous, TID AC  magnesium  sulfate, 1 g, Intravenous, Q12H  metoclopramide , 5 mg, Intravenous, 4 times per day  montelukast , 10 mg, Oral, QHS  pantoprazole , 40 mg, Intravenous, Q12H SCH  polycarbophil, 625 mg, Oral, BID  scopolamine , 1 patch, Transdermal, Q72H  senna-docusate, 1 tablet, Oral, BID      Continuous Infusions:   PRN Meds:.acetaminophen , albuterol , bengay greaseless, benzocaine -menthol , benzonatate , butalbital -acetaminophen -caffeine , carboxymethylcellulose sodium, dextrose  **OR** dextrose  **OR** dextrose  **OR** glucagon  (rDNA), magnesium  sulfate, melatonin, morphine , naloxone , ondansetron  **OR** ondansetron , potassium & sodium phosphates , potassium chloride  **OR** potassium chloride  **OR** potassium chloride , saline  Problem List:  Patient Active Problem List    Generalized abdominal pain         Date Noted: 03/21/2023      Pancreatic mass         Date Noted: 03/21/2023      Nausea & vomiting         Date Noted: 03/21/2023      Severe persistent asthma without complication         Date Noted: 12/10/2022      Eosinophilia         Date Noted: 12/10/2022      Bronchiectasis without complication         Date Noted: 12/10/2022      Personal  history of COVID-19         Date Noted: 12/10/2022      Personal history of pneumonia (recurrent)         Date Noted: 12/10/2022      Abnormal chest CT         Date Noted: 12/10/2022      Chronic cough         Date Noted: 12/10/2022      Ischemic cerebrovascular accident (CVA)         Date Noted: 11/14/2020      Polycythemia         Date Noted: 11/08/2020      Vitamin D  deficiency         Date Noted: 11/08/2020      Hypoxemia         Date Noted: 03/25/2020      Uterine leiomyoma         Date Noted: 10/11/2017      Intractable chronic migraine without aura         Date Noted: 05/02/2017      Lumbar spondylosis         Date Noted: 04/22/2017  Obstructive sleep apnea (adult) (pediatric)         Date Noted: 10/04/2016      GERD (gastroesophageal reflux disease)         Date Noted: 02/19/2016      Unspecified astigmatism, bilateral         Date Noted: 02/19/2016      History:  Past Medical History:  No date: COVID-19  2021: COVID-19  No date: Heart valve problem  No date: Kidney stone  No date: Pneumonia  Past Surgical History:  No date: CYST REMOVAL  No date: GALLBLADDER SURGERY  No date: HERNIA REPAIR  No date: HYSTERECTOMY  No date: TUBAL LIGATION  Labs    Lab Results       Component                Value               Date                       WBC                      5.20                03/24/2023                 HGB                      14.1                03/24/2023                 HCT                      43.8 (H)            03/24/2023                 PLT                      308                 03/24/2023                 ALT                      69 (H)              03/25/2023                 AST                      34                  03/25/2023                 NA                       142                 03/25/2023                 K                        3.7  03/25/2023                 CL                       103                 03/25/2023                 CO2                      29                   03/25/2023                 CREAT                    0.6                 03/25/2023                 BUN                      8                   03/25/2023                 TSH                      0.12 (L)            03/22/2023                 PT                       11.0                03/25/2023                 PTT                      30                  03/22/2023                 INR                      1.0                 03/25/2023                 GLU                      90                  03/25/2023                 HGBA1C                   6.0 (H)             03/22/2023                 MG                       2.9 (H)  03/24/2023                Redell DELENA Bless, MD, MD                                    Relevant Problems   ANESTHESIA   (+) Obstructive sleep apnea (adult) (pediatric)      PULMONARY   (+) Obstructive sleep apnea (adult) (pediatric)      NEURO/PSYCH   (+) Intractable chronic migraine without aura   (+) Ischemic cerebrovascular accident (CVA)      CARDIO   (+) Intractable chronic migraine without aura      GI   (+) GERD (gastroesophageal reflux disease)               Anesthesia Plan    ASA 3     general               (Snoring with OSA between answering questions)      intravenous induction               informed consent obtained                 Signed by: Redell DELENA Bless, MD 03/25/23 7:25 AM

## 2023-03-26 ENCOUNTER — Encounter: Payer: Self-pay | Admitting: Internal Medicine

## 2023-03-26 LAB — COMPREHENSIVE METABOLIC PANEL
ALT: 68 U/L — ABNORMAL HIGH (ref <=55)
AST (SGOT): 34 U/L (ref <=41)
Albumin/Globulin Ratio: 1.3 (ref 0.9–2.2)
Albumin: 3.8 g/dL (ref 3.5–5.0)
Alkaline Phosphatase: 117 U/L (ref 37–117)
Anion Gap: 10 (ref 5.0–15.0)
BUN: 11 mg/dL (ref 7–21)
Bilirubin, Total: 0.5 mg/dL (ref 0.2–1.2)
CO2: 26 meq/L (ref 17–29)
Calcium: 9.2 mg/dL (ref 8.5–10.5)
Chloride: 103 meq/L (ref 99–111)
Creatinine: 0.6 mg/dL (ref 0.4–1.0)
GFR: >60 mL/min/{1.73_m2} (ref >=60.0)
Globulin: 2.9 g/dL (ref 2.0–3.6)
Glucose: 147 mg/dL — ABNORMAL HIGH (ref 70–100)
Potassium: 4.2 meq/L (ref 3.5–5.3)
Protein, Total: 6.7 g/dL (ref 6.0–8.3)
Sodium: 139 meq/L (ref 135–145)

## 2023-03-26 LAB — CULTURE BLOOD AEROBIC AND ANAEROBIC: Culture Blood: NO GROWTH

## 2023-03-26 LAB — LAB USE ONLY - LAVENDER - EDTA HOLD TUBE

## 2023-03-26 LAB — LAB USE ONLY - GOLD SST HOLD TUBE

## 2023-03-26 LAB — WHOLE BLOOD GLUCOSE POCT
Whole Blood Glucose POCT: 105 mg/dL — ABNORMAL HIGH (ref 70–100)
Whole Blood Glucose POCT: 137 mg/dL — ABNORMAL HIGH (ref 70–100)
Whole Blood Glucose POCT: 125 mg/dL — ABNORMAL HIGH (ref 70–100)
Whole Blood Glucose POCT: 132 mg/dL — ABNORMAL HIGH (ref 70–100)

## 2023-03-26 LAB — HEPATITIS PANEL, ACUTE
Hepatitis A Antibody, IgM: NONREACTIVE
Hepatitis B Core IgM: NONREACTIVE
Hepatitis B Surface Antigen: NONREACTIVE
Hepatitis C Antibody: NONREACTIVE

## 2023-03-26 LAB — HIV-1/2, ANTIGEN AND ANTIBODY WITH REFLEX TO CONFIRMATION: HIV Ag/Ab 4th Generation: NONREACTIVE

## 2023-03-26 MED ORDER — METOCLOPRAMIDE HCL 10 MG PO TABS
5.0000 mg | ORAL_TABLET | Freq: Four times a day (QID) | ORAL | Status: DC
Start: 2023-03-26 — End: 2023-04-06
  Administered 2023-03-26 – 2023-04-05 (×39): 5 mg via ORAL
  Filled 2023-03-26 (×41): qty 1

## 2023-03-26 MED ORDER — POLYETHYLENE GLYCOL 3350 17 G PO PACK
17.0000 g | PACK | Freq: Every day | ORAL | Status: DC
Start: 2023-03-26 — End: 2023-04-06
  Administered 2023-03-26 – 2023-04-05 (×10): 17 g via ORAL
  Filled 2023-03-26 (×11): qty 1

## 2023-03-26 MED ORDER — LIDOCAINE VISCOUS HCL 2 % MT SOLN
5.0000 mL | Freq: Four times a day (QID) | OROMUCOSAL | Status: AC | PRN
Start: 2023-03-26 — End: ?
  Administered 2023-03-26 – 2023-04-07 (×2): 5 mL via ORAL
  Filled 2023-03-26: qty 15
  Filled 2023-03-26 (×2): qty 5
  Filled 2023-03-26: qty 15
  Filled 2023-03-26 (×2): qty 5

## 2023-03-26 NOTE — Plan of Care (Addendum)
 Patient reported feeling small lump under R breast-night MD made aware, will pass to day attending    Nursing progress note:   Neuro: A&Ox4, afebrile  Vitals: Vital signs stable on 1L NC  Integumentary: Scattered scars, scattered bruising, scattered tattoos, generalized non-pitting edema  Respiratory: Expiratory wheezing, rhonchi, diminished lung sounds, patient denies SOB, encouraged use of IS  Cardio: RRR, No telemetry, patient denies chest pain  GU: Voiding freely  GI: Regular diet, -N/V; +flatus, LBM 2/25 per pt x2 small BMs  IV: 20G LAC SL.   Pain control: Patient reported 10/10 pain in mouth, throat, neck, and R ear- morphine  and lidocaine  viscous given. Patient educated on pain management.  Mobility: SBA x1  Safety: Safety precautions in place, call bell within reach    POC: Pain mngmt, anti-fungal, magic mouthwash    Problem: Pain interferes with ability to perform ADL  Goal: Pain at adequate level as identified by patient  Outcome: Progressing  Flowsheets (Taken 03/25/2023 2031)  Pain at adequate level as identified by patient:   Identify patient comfort function goal   Assess for risk of opioid induced respiratory depression, including snoring/sleep apnea. Alert healthcare team of risk factors identified.   Assess pain on admission, during daily assessment and/or before any as needed intervention(s)   Reassess pain within 30-60 minutes of any procedure/intervention, per Pain Assessment, Intervention, Reassessment (AIR) Cycle   Evaluate if patient comfort function goal is met   Evaluate patient's satisfaction with pain management progress   Offer non-pharmacological pain management interventions     Problem: Side Effects from Pain Analgesia  Goal: Patient will experience minimal side effects of analgesic therapy  Outcome: Progressing  Flowsheets (Taken 03/25/2023 2031)  Patient will experience minimal side effects of analgesic therapy:   Monitor/assess patient's respiratory status (RR depth, effort, breath  sounds)   Assess for changes in cognitive function   Prevent/manage side effects per LIP orders (i.e. nausea, vomiting, pruritus, constipation, urinary retention, etc.)   Evaluate for opioid-induced sedation with appropriate assessment tool (i.e. POSS)     Problem: Day of Admission - Heart Failure  Goal: Heart Failure Admission  Outcome: Progressing  Flowsheets (Taken 03/26/2023 0301)  Heart Failure Admission:   Strict Intake/Output   Assess for swelling/edema and document     Problem: Everyday - Heart Failure  Goal: Stable Vital Signs and Fluid Balance  Outcome: Progressing  Flowsheets (Taken 03/26/2023 0301)  Stable Vital Signs and Fluid Balance:   Monitor, assess vital signs and telemetry per policy   Daily Standing Weights in the morning using the same scale, after using the bathroom and before breadfast.  If unable to stand, zero the bed and use the bed scale   Monitor labs and report abnormalities to physician   Strict Intake/Output   Assess for swelling/edema   Wean oxygen as needed if appropriate  Goal: Mobility/Activity is Maintained at Optimal Level for Patient  Outcome: Progressing  Flowsheets (Taken 03/26/2023 0301)  Mobility/Activity is Maintained at Optimal Level for Patient:   Increase mobility as tolerated/progressive mobility protocol   Assess for changes in respiratory status, level of consciousness and/or development of fatigue  Goal: Nutritional Intake is Adequate  Outcome: Progressing  Flowsheets (Taken 03/26/2023 0301)  Nutritional Intake is Adequate: Assess appetite,anorexia and amount of meal/food tolerated  Goal: Teaching-Using CHF Warning Zones and Educational Videos  Outcome: Progressing  Flowsheets (Taken 03/26/2023 0301)  Teaching-Using CHF Warning Zones and Educational Videos:   Signs & Symptoms of CHF   Medications  Problem: Day of Discharge - Heart Failure  Goal: Discharge Education  Outcome: Progressing  Flowsheets (Taken 03/26/2023 0301)  Day of Discharge:   After Visit Summary  (Discharge Instuctions) with medications   CHF Warning Zones and when to call for help   Follow-up appointments   Daily Standing Weights   Low Sodium Diet     Problem: Compromised Activity/Mobility  Goal: Activity/Mobility Interventions  Outcome: Progressing  Flowsheets (Taken 03/25/2023 2031)  Activity/Mobility Interventions: Pad bony prominences, TAP Seated positioning system when OOB, Promote PMP, Reposition q 2 hrs / turn clock, Offload heels     Problem: Compromised Sensory Perception  Goal: Sensory Perception Interventions  Outcome: Progressing  Flowsheets (Taken 03/25/2023 2031)  Sensory Perception Interventions: Offload heels, Pad bony prominences, Reposition q 2hrs/turn Clock, Q2 hour skin assessment under devices if present     Problem: Compromised Moisture  Goal: Moisture level Interventions  Outcome: Progressing  Flowsheets (Taken 03/25/2023 2031)  Moisture level Interventions: Moisture wicking products, Moisture barrier cream     Problem: Compromised Nutrition  Goal: Nutrition Interventions  Outcome: Progressing  Flowsheets (Taken 03/25/2023 2031)  Nutrition Interventions: Discuss nutrition at Rounds, I&Os, Document % meal eaten, Daily weights     Problem: Compromised Friction/Shear  Goal: Friction and Shear Interventions  Outcome: Progressing  Flowsheets (Taken 03/25/2023 2031)  Friction and Shear Interventions: Pad bony prominences, Off load heels, HOB 30 degrees or less unless contraindicated, Consider: TAP seated positioning, Heel foams     Problem: Moderate/High Fall Risk Score >5  Goal: Patient will remain free of falls  Outcome: Progressing  Flowsheets (Taken 03/26/2023 0301)  Moderate Risk (6-13):   MOD-Consider activation of bed alarm if appropriate   MOD-Floor mat at bedside (where available) if appropriate   MOD-Remain with patient during toileting   MOD-Perform dangle, stand, walk (DSW) prior to mobilization

## 2023-03-26 NOTE — Plan of Care (Addendum)
 Neuro: A/Ox4  Respiratory: Lung sounds rhonchi and crackles; patient denies SOB; uses CPAP @ night; cough managed with tessalon  pearls and Cepacol   Cardio: No tele, patient denies chest pain;   GU: Voiding freely ;   GI: Regular diet, +N- PRN Zofran  and scopolamine  patch (R ear); -V; +flatus, LBM 2/25; per patient  Pain control: Pain managed with PRN morphine .  Mobility: x1 minimal assistance w/ walker  Safety: Safety precautions maintained and call bell within reach.        Problem: Pain interferes with ability to perform ADL  Goal: Pain at adequate level as identified by patient  Flowsheets (Taken 03/26/2023 1628)  Pain at adequate level as identified by patient:   Identify patient comfort function goal   Assess for risk of opioid induced respiratory depression, including snoring/sleep apnea. Alert healthcare team of risk factors identified.   Assess pain on admission, during daily assessment and/or before any as needed intervention(s)   Reassess pain within 30-60 minutes of any procedure/intervention, per Pain Assessment, Intervention, Reassessment (AIR) Cycle   Evaluate if patient comfort function goal is met   Evaluate patient's satisfaction with pain management progress   Offer non-pharmacological pain management interventions     Problem: Side Effects from Pain Analgesia  Goal: Patient will experience minimal side effects of analgesic therapy  Flowsheets (Taken 03/26/2023 1628)  Patient will experience minimal side effects of analgesic therapy:   Monitor/assess patient's respiratory status (RR depth, effort, breath sounds)   Assess for changes in cognitive function   Prevent/manage side effects per LIP orders (i.e. nausea, vomiting, pruritus, constipation, urinary retention, etc.)     Problem: Everyday - Heart Failure  Goal: Stable Vital Signs and Fluid Balance  Flowsheets (Taken 03/26/2023 1628)  Stable Vital Signs and Fluid Balance:   Assess for swelling/edema   Monitor labs and report abnormalities to  physician   Strict Intake/Output   Monitor, assess vital signs and telemetry per policy  Goal: Mobility/Activity is Maintained at Optimal Level for Patient  Flowsheets (Taken 03/26/2023 1628)  Mobility/Activity is Maintained at Optimal Level for Patient:   Assess for changes in respiratory status, level of consciousness and/or development of fatigue   Increase mobility as tolerated/progressive mobility protocol     Problem: Compromised Activity/Mobility  Goal: Activity/Mobility Interventions  Flowsheets (Taken 03/26/2023 0845)  Activity/Mobility Interventions: Pad bony prominences, TAP Seated positioning system when OOB, Promote PMP, Reposition q 2 hrs / turn clock, Offload heels     Problem: Compromised Sensory Perception  Goal: Sensory Perception Interventions  Flowsheets (Taken 03/26/2023 0845)  Sensory Perception Interventions: Offload heels, Pad bony prominences, Reposition q 2hrs/turn Clock, Q2 hour skin assessment under devices if present     Problem: Compromised Moisture  Goal: Moisture level Interventions  Flowsheets (Taken 03/26/2023 0845)  Moisture level Interventions: Moisture wicking products, Moisture barrier cream     Problem: Compromised Nutrition  Goal: Nutrition Interventions  Flowsheets (Taken 03/26/2023 0845)  Nutrition Interventions: Discuss nutrition at Rounds, I&Os, Document % meal eaten, Daily weights     Problem: Compromised Friction/Shear  Goal: Friction and Shear Interventions  Flowsheets (Taken 03/26/2023 0845)  Friction and Shear Interventions: Pad bony prominences, Off load heels, HOB 30 degrees or less unless contraindicated, Consider: TAP seated positioning, Heel foams     Problem: Moderate/High Fall Risk Score >5  Goal: Patient will remain free of falls  Flowsheets (Taken 03/26/2023 0845)  Moderate Risk (6-13):   MOD-Consider activation of bed alarm if appropriate   MOD-Floor mat at  bedside (where available) if appropriate   MOD-Apply bed exit alarm if patient is confused   LOW-Fall  Interventions Appropriate for Low Fall Risk

## 2023-03-26 NOTE — Progress Notes (Signed)
 Quick Doc  [facility]   Patient Name: Valerie May   Attending Physician: Nitta, Bradley H, MD   Today's date:   03/26/2023 LOS: 4 days   Expected Discharge Date      Quick  Assessment:                                                              ReAdmit Risk Score: 12.1    CM Comments: 2/25: abdominal pain, NV, GI consulted - pending EGD/colon.  DISPO: home w/family - need MCD app update    Discussed in MDR this morning. Esophageal candidiasis and pancreatic mass on EUS/EGD, monitor for symptom improvement, pathology pending and may follow up outpatient.  [place] pending symptom management.    DISPO: home                                                                                   Provider Notifications:

## 2023-03-26 NOTE — OT Progress Note (Signed)
 Occupational Therapy Treatment Valerie May        Post Acute Care Therapy Recommendations:     Discharge Recommendations:  Home with supervision    DME needs IF patient is discharging home: Shower chair    Therapy discharge recommendations may change with patient status.  Please refer to most recent note for up-to-date recommendations.      Assessment:   Significant Findings: Pt has met all goals, D/C from OT acute caseload.    Pt received in bed and was agreeable to OT. Pt A&O x4, reports taking her oxygen off. Pt mostly impacted by abdominal discomfort and impaired activity tolerance (requiring 1L O2 NC during functional mobility w/FWW). Pt overall independent>modI during ADL tasks and reports she has engaged in x3 showers with nursing staff without issues. Pt with no further questions or concerns regarding performance of self care tasks at home. Pt has met all acute OT POC goals, no additional skilled acute OT needs at this time. Please reconsult if there is any significant changes in ADL independence or functional mobility. D/C acute OT services.    Treatment Activities: BADL training, Functional mobility/transfer training, ECT/pacing, Pt education    Educated the patient to role of occupational therapy, plan of care, goals of therapy and safety with mobility and ADLs.    Plan:   OT Frequency Recommended: therapy discontinued     Discharge from OT Acute Care Services.    Unit: [place]  Bed: T671/T671.98        Precautions and Contraindications:   Weight Bearing Status: no restrictions  Fall Risks: Medium, History of fall(s), Impaired balance/gait, Impaired mobility, Muscle weakness (dizziness)  Other Precautions: Falls, Dizziness    Updated Medical Status/Imaging/Tests/Labs:  Rad: reviewed   Lab Results   Component Value Date/Time    HGB 14.1 03/24/2023 04:23 AM    HGB 16.2 (H) 11/07/2020 02:48 PM    HCT 43.8 (H) 03/24/2023 04:23 AM    HCT 48.8 (H) 11/07/2020 02:48 PM    K 4.2  03/26/2023 03:08 AM    K 3.9 11/07/2020 02:48 PM    NA 139 03/26/2023 03:08 AM    NA 139 11/07/2020 02:48 PM    INR 1.0 03/25/2023 03:18 AM    INR 1.2 (H) 07/05/2007 09:25 PM    TROPI <2.7 03/21/2023 05:06 AM       Consult received for Valerie May for OT Evaluation and Treatment.  Patient's medical condition is appropriate for Occupational Therapy intervention at this time.    Subjective:  I'm doing better    Patient is agreeable to participation in therapy session and Nursing clears patient for therapy.    Patient Goal: Patient Goal: To be independent, work  Pain:   Pain Assessment: Numeric Scale (0-10) (Pt)  Pain Score: 9-severe pain  Pain Location: Abdomen  Pain Orientation: Mid, Anterior, Posterior    Pain interventions: re-positioned, emotional support, rest, and RN made aware    Objective:   Patient is seated in a bedside chair with dressings and peripheral IV, O2 at 1 liters/minute via nasal cannula in place.  Pt wore mask during therapy session:No      Vital Signs:  SpO2 at rest: 89% on room air  SpO2 with activity: 94%  on 1 liters/min via nasal cannula    Cognitive Status and Neuro Exam:  Arousal/Alertness: appropriate responses to stimuli  Attention span: appears intact  Orientation Level: Oriented x4  Memory: appears intact  Follows commands: independent  Safety Awareness: independent    Activities of Daily Living:  Eating: independent in a chair  Grooming: independent at sink for hand hygiene  LB Dressing: mod I at EOB to don socks  Toileting: independent in bathroom for LB clothing management  for pericare    Bed Mobility:   Rolling: Independent    Supine to Sit: Modified Independent  Scooting to EOB: Independent    Functional Transfers/Mobility:  Sit to Stand: Independent w/ RW  Stand to Sit: Independent w/ RW  Bed to Bedside Chair: Modified Independent, stand-step transfer and with RW  Ambulation: Modified Independent w/ RW    PMP Activity: Step 6 - Walks in Room    Balance:  Static Sitting:  good  Dynamic Sitting: good  Static Standing: good  Dynamic Standing: good-    Therapeutic Exercises:  During ADL participation       Participation and Activity Tolerance:  Participation Effort: excellent  Endurance: good    Patient left with call bell within reach, all needs met,   SCD's off (as found)  Floor mat in place  Chair alarm in place  and all questions answered. RN notified of session outcome and patient response.      Goals:  Time For Goal Achievement: 3 visits  ADL Goals  Patient will groom self: Independent, Goal met  Patient will dress lower body: Independent, Goal met  Pt will complete bathing: Independent, Discontinued (comment) (patient reports engaging in x3 showers during this hospital stay without issues)  Mobility and Transfer Goals  Other Goal: Pt will mobilize a large household distance independently, rest breaks as needed modI: Goal Met                         PPE worn during session: procedural mask and gloves    Tech present: N/A  PPE worn by tech: N/A        Duwaine Fowler, MS, OTR/L  Can be reached via Secure Chat       Time of Treatment:   OT Received On: 03/26/23  Start Time: 1430  Stop Time: 1455  Time Calculation (min): 25 min

## 2023-03-26 NOTE — Progress Notes (Signed)
 MEDICINE PROGRESS NOTE    Date Time: 03/26/23 8:16 AM  Patient Name: [name]  Attending Physician: Valerie Hearty H, MD    Hospital Course/Interim Summary:   Valerie May is a 53 y.o. female with a history of asthma, eosinophilia, mild bronchiectasis, recurrent pneumonia/bronchitis, migraine headaches, prolonged admit in [place]  for COVID-19 in 2021, sinus disease, GERD, DM2 who presented to [facility] 03/21/2023 with intractable nausea and vomiting, dyspnea, chest abdominal pain, acute on chronic pain in right arm, headache.  Early course complicated by acute respiratory acidosis felt to be due to IV opiates.  An MRI showed concern for pancreatic cancer.  She is undergoing EGD/EUS 2/26.    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  Continue empiric fluconazole  400 mg daily for 14 to 21-day course  Monitor for improvement in symptoms with treatment of esophageal candidiasis  Negative HIV and hepatitis screen  Follow-up esophageal brushing  Follow-up pathology from EUS biopsy of pancreatic lesion  Analgesia and antiemetics as needed  Outpatient follow-up for right renal lesion  Bowel regimen    Safety Checklist:   DVT Prophylaxis: Lovenox   Nutrition: Regular diet  Code Status: Full code      Subjective     CC: Generalized abdominal pain    Interval History/24 hour events/HPI/Subjective:   Underwent EGD/EUS with biopsy of pancreatic  lesion  Still having throat pain and lip swelling  Doing okay on regular diet  Some abdominal pain    Review of Systems:     Negative except per HPI    Physical Exam:     VITAL SIGNS PHYSICAL EXAM   Temp:  [97 F (36.1 C)-99.1 F (37.3 C)] 98.4 F (36.9 C)  Heart Rate:  [91-108] 91  Resp Rate:  [15-24] 24  BP: (107-129)/(56-87) 118/78        Intake/Output Summary (Last 24 hours) at 03/26/2023 0816  Last data filed at 03/26/2023 9361  Gross per 24 hour   Intake 1671 ml   Output --   Net 1671 ml    Physical Exam  General: awake, alert, oriented x 3; no acute distress.  HEENT: perrla, eomi, sclera anicteric oropharynx clear without lesions, mucous membranes moist, edematous upper lip  Neck: supple, no LAD, no thyromegaly, no JVD, no carotid bruits  Cardiovascular: regular rate and rhythm, no murmurs, rubs, or gallops  Lungs: clear to auscultation bilaterally, without wheezing, rhonchi, or rales  Abdomen: soft, non-tender, non-distended; no palpable masses, no hepatosplenomegaly, normoactive bowel sounds, no rebound or guarding  Extremities: no clubbing, cyanosis, or edema  Neuro: cranial nerves grossly intact, strength 5/5 in upper and lower extremities, sensation intact  Skin: no rashes or lesions noted         Meds:     Medications were reviewed:    Labs:     Labs (last 72 hours):    Recent Labs   Lab 03/24/23  0423 03/23/23  0330   WBC 5.20 6.62  Hemoglobin 14.1 12.8   Hematocrit 43.8* 40.4   Platelet Count 308 250       Recent Labs   Lab 03/25/23  0318 03/22/23  1441   PT 11.0 11.8   INR 1.0 1.0   PTT  --  30       Microbiology, reviewed and are significant for:  Microbiology Results (last 15 days)       Procedure Component Value Units Date/Time    Respiratory Pathogen Panel With COVID-19, PCR [[id number]]     Order Status: Sent Specimen: Nasopharyngeal Swab from Nasopharynx     COVID-19 and Influenza (Liat) (symptomatic) [[id number]]  (Normal) Collected: 03/21/23 0532    Order Status: Completed Specimen: Swab  from Anterior Nares Updated: 03/21/23 0646     SARS-CoV-2 (COVID-19) RNA Not Detected     Influenza A RNA Not Detected     Influenza B RNA Not Detected    Narrative:      A result of Detected indicates POSITIVE for the presence of viral RNA  A result of Not Detected indicates NEGATIVE for the presence of viral RNA    Test performed using the Roche cobas Liat SARS-CoV-2 & Influenza A/B assay. This is a multiplex real-time RT-PCR assay for the detection of SARS-CoV-2, influenza A, and influenza B virus RNA. Viral nucleic acids may persist in vivo, independent of viability. Detection of viral nucleic acid does not imply the presence of infectious virus, or that virus nucleic acid is the cause of clinical symptoms. Negative results do not preclude SARS-CoV-2, influenza A, and/or influenza B infection and should not be used as the sole basis for diagnosis, treatment or other patient management decisions. Invalid results may be due to inhibiting substances in the specimen and recollection should occur.     Culture, Blood, Aerobic And Anaerobic [[id number]] Collected: 03/21/23 0506    Order Status: Completed Specimen: Blood, Venous Updated: 03/25/23 0900     Culture Blood No growth at 4 days              Imaging, reviewed and are significant for:  MRI Abdomen W WO Contrast MRCP    Result Date: 03/24/2023   Exam quality degraded by artifact arising from motion. The following observations are made within these confines. 1.Pancreatic head 1.2 cm enhancing lesion, suspected neoplasm. This lesion appears confined of the pancreas and demonstrates no major vascular interfaces. Recommend EUS for further evaluation. 2.No findings suspicious for metastatic disease within the imaged abdomen. 3.Mid right renal 1.7 cm cyst, recently demonstrating intermediate density by CT and is benign. 4.Hepatic steatosis. 5.Colonic diverticulosis. Valerie Miyamoto, MD 03/24/2023 8:49 AM    CT Head WO Contrast    Result Date: 03/22/2023   No acute  intracranial abnormality. Valerie ONEIDA Sewer, MD 03/22/2023 7:28 PM    Shoulder Right 2+ Views    Result Date: 03/21/2023  1. No fracture of the shoulder. 2. Old healed fractures of the right lateral third and fourth ribs. Derick Clap, MD 03/21/2023 2:09 PM    CT Abd/Pelvis with IV Contrast only    Result Date: 03/21/2023  1.No acute abnormality within the chest, abdomen, or pelvis. 2.No findings of acute pulmonary embolism within the limitations stated above. 3.Hyperdense pancreatic head lesion. Further evaluation with MRI/MRCP is recommended. 4.Mildly hyperdense right renal lesion, possible hemorrhagic/proteinaceous cyst. This can also be further evaluated on MRI. 5.Additional chronic findings as above. Sue CHRISTELLA Fila, DO 03/21/2023 8:51 AM    CT Angio Chest (PE study)  Result Date: 03/21/2023  1.No acute abnormality within the chest, abdomen, or pelvis. 2.No findings of acute pulmonary embolism within the limitations stated above. 3.Hyperdense pancreatic head lesion. Further evaluation with MRI/MRCP is recommended. 4.Mildly hyperdense right renal lesion, possible hemorrhagic/proteinaceous cyst. This can also be further evaluated on MRI. 5.Additional chronic findings as above. Sue CHRISTELLA Fila, DO 03/21/2023 8:51 AM    Chest AP Portable    Result Date: 03/21/2023  No acute pulmonary process. Odella Edelson, MD 03/21/2023 5:31 AM    Recent Labs   Lab 03/26/23  0308 03/25/23  0318   Sodium 139 142   Potassium 4.2 3.7   Chloride 103 103   CO2 26 29   BUN 11 8   Creatinine 0.6 0.6   Calcium  9.2 8.7   Albumin 3.8 3.4*   Protein, Total 6.7 6.2   Bilirubin, Total 0.5 0.4   Alkaline Phosphatase 117 94   ALT 68* 69*   AST (SGOT) 34 34   Glucose 147* 90        Signed by: Adine VEAR Fort, MD

## 2023-03-27 LAB — COMPREHENSIVE METABOLIC PANEL
ALT: 96 U/L — ABNORMAL HIGH (ref <=55)
AST (SGOT): 55 U/L — ABNORMAL HIGH (ref <=41)
Albumin/Globulin Ratio: 1.2 (ref 0.9–2.2)
Albumin: 3.8 g/dL (ref 3.5–5.0)
Alkaline Phosphatase: 148 U/L — ABNORMAL HIGH (ref 37–117)
Anion Gap: 10 (ref 5.0–15.0)
BUN: 9 mg/dL (ref 7–21)
Bilirubin, Total: 0.5 mg/dL (ref 0.2–1.2)
CO2: 25 meq/L (ref 17–29)
Calcium: 9.6 mg/dL (ref 8.5–10.5)
Chloride: 103 meq/L (ref 99–111)
Creatinine: 0.6 mg/dL (ref 0.4–1.0)
GFR: >60 mL/min/{1.73_m2} (ref >=60.0)
Globulin: 3.3 g/dL (ref 2.0–3.6)
Glucose: 109 mg/dL — ABNORMAL HIGH (ref 70–100)
Potassium: 4.2 meq/L (ref 3.5–5.3)
Protein, Total: 7.1 g/dL (ref 6.0–8.3)
Sodium: 138 meq/L (ref 135–145)

## 2023-03-27 LAB — WHOLE BLOOD GLUCOSE POCT
Whole Blood Glucose POCT: 104 mg/dL — ABNORMAL HIGH (ref 70–100)
Whole Blood Glucose POCT: 156 mg/dL — ABNORMAL HIGH (ref 70–100)
Whole Blood Glucose POCT: 155 mg/dL — ABNORMAL HIGH (ref 70–100)
Whole Blood Glucose POCT: 164 mg/dL — ABNORMAL HIGH (ref 70–100)

## 2023-03-27 MED ORDER — VENLAFAXINE HCL 25 MG PO TABS
25.0000 mg | ORAL_TABLET | Freq: Three times a day (TID) | ORAL | Status: AC
Start: 2023-03-27 — End: ?
  Administered 2023-03-27 – 2023-04-06 (×28): 25 mg via ORAL
  Filled 2023-03-27 (×39): qty 1

## 2023-03-27 NOTE — Plan of Care (Addendum)
 NURSING PROGRESS NOTE  Neuro: A&Ox4  Resp: Lung sounds dimished/rhonchi, encouraged use of IS often   Cardiac: Reg HR, No telemetry, patient denies chest pain  Integumentary: scattered scars, bruising, tattoos. Scopolamine  patch behind R ear  GU: Voiding freely  GI: Reg diet, ACHS, +N (x1 zofran  given this shift)/-V, (+) flatus, LBM: 2/28  IV: PIV to LFA- SL  Pain control:  Generalized pain of 6/10, morphine  and rest providing relief. Patient educated on pain management.  Mobility: SBA  Safety: Safety precautions in place: bed in lowest position, fall mat in place, call bell within reach.    Vitals: VS on 1L NC    Problem: Pain interferes with ability to perform ADL  Goal: Pain at adequate level as identified by patient  Outcome: Progressing     Problem: Side Effects from Pain Analgesia  Goal: Patient will experience minimal side effects of analgesic therapy  Outcome: Progressing     Problem: Everyday - Heart Failure  Goal: Nutritional Intake is Adequate  Outcome: Progressing  Goal: Teaching-Using CHF Warning Zones and Educational Videos  Outcome: Progressing     Problem: Compromised Activity/Mobility  Goal: Activity/Mobility Interventions  Outcome: Progressing

## 2023-03-27 NOTE — Progress Notes (Signed)
 Chaplain Service      Assessment:       Background:  Visit Type: Initial was made by Chaplain with patient, Gasper JONETTA Mater, based on Source: Patient Request.  Present at Visit: Patient.  Spiritual Care Provided to: patient.    .    Summary:  Reason for Visit: Emotional Support, Spiritual Support, Prayer Request       Spiritual Care Outcomes: Made a positive connection with patient, Patient expressed appreciation of visit, Emotional catharsis                    Situation: Chaplain visited with pt per pt request for spiritual support. Chaplain provided reflective listening, emotional support, and spiritual encouragement as pt processed health journey, family relationships, and spirituality. Chaplain prayed with pt.        Background: Pt is Catholic and identified her relationship with God as a strong source of encouragement.        Assessment: At the beginning of the visit, pt expressed a desire for prayer. At the end of the visit, pt expressed appreciation for visit.        Recommendation: Pt expressed openness to meeting with Catholic chaplain. Chaplaincy services are always available upon request.          Idelia Little, MDiv.  PRN [facility]  Pager: [phone]  [email or web address]   For emergencies, page the On-Call Chaplain at [phone]

## 2023-03-27 NOTE — Plan of Care (Signed)
 Nursing progress note:   Neuro: A&Ox4, afebrile  Vitals: Vital signs stable on 1-2L NC  Integumentary: Scattered scars, scattered bruising, scattered tattoos, generalized non-pitting edema, swollen upper lip after EGD  Respiratory: Expiratory wheezing, rhonchi, diminished lung sounds, patient denies SOB, encouraged use of IS  Cardio: RRR, No telemetry, patient denies chest pain  GU: Voiding freely  GI: Regular diet, +N/-V; +flatus, LBM 2/25 per pt x2 small BMs  IV: 20G LAC SL.   Pain control: Patient reported 10/10 pain in mouth, throat, neck, and R ear- morphine  given. Patient educated on pain management.  Mobility: SBA x1  Safety: Safety precautions in place, call bell within reach     POC: Pain mngmt, anti-fungal, magic mouthwash, control nausea.    Problem: Pain interferes with ability to perform ADL  Goal: Pain at adequate level as identified by patient  Outcome: Progressing  Flowsheets (Taken 03/26/2023 2009)  Pain at adequate level as identified by patient:   Identify patient comfort function goal   Assess for risk of opioid induced respiratory depression, including snoring/sleep apnea. Alert healthcare team of risk factors identified.   Reassess pain within 30-60 minutes of any procedure/intervention, per Pain Assessment, Intervention, Reassessment (AIR) Cycle   Assess pain on admission, during daily assessment and/or before any as needed intervention(s)   Evaluate if patient comfort function goal is met   Evaluate patient's satisfaction with pain management progress   Offer non-pharmacological pain management interventions     Problem: Side Effects from Pain Analgesia  Goal: Patient will experience minimal side effects of analgesic therapy  Outcome: Progressing  Flowsheets (Taken 03/26/2023 2009)  Patient will experience minimal side effects of analgesic therapy:   Monitor/assess patient's respiratory status (RR depth, effort, breath sounds)   Assess for changes in cognitive function   Prevent/manage side  effects per LIP orders (i.e. nausea, vomiting, pruritus, constipation, urinary retention, etc.)   Evaluate for opioid-induced sedation with appropriate assessment tool (i.e. POSS)     Problem: Day of Admission - Heart Failure  Goal: Heart Failure Admission  Outcome: Progressing  Flowsheets (Taken 03/26/2023 0301)  Heart Failure Admission:   Strict Intake/Output   Assess for swelling/edema and document     Problem: Everyday - Heart Failure  Goal: Stable Vital Signs and Fluid Balance  Outcome: Progressing  Flowsheets (Taken 03/27/2023 0354)  Stable Vital Signs and Fluid Balance:   Monitor labs and report abnormalities to physician   Daily Standing Weights in the morning using the same scale, after using the bathroom and before breadfast.  If unable to stand, zero the bed and use the bed scale   Assess for swelling/edema   Strict Intake/Output  Goal: Mobility/Activity is Maintained at Optimal Level for Patient  Outcome: Progressing  Flowsheets (Taken 03/27/2023 0354)  Mobility/Activity is Maintained at Optimal Level for Patient:   Increase mobility as tolerated/progressive mobility protocol   Assess for changes in respiratory status, level of consciousness and/or development of fatigue  Goal: Nutritional Intake is Adequate  Outcome: Progressing  Flowsheets (Taken 03/26/2023 0301)  Nutritional Intake is Adequate: Assess appetite,anorexia and amount of meal/food tolerated  Goal: Teaching-Using CHF Warning Zones and Educational Videos  Outcome: Progressing  Flowsheets (Taken 03/26/2023 0301)  Teaching-Using CHF Warning Zones and Educational Videos:   Signs & Symptoms of CHF   Medications     Problem: Day of Discharge - Heart Failure  Goal: Discharge Education  Outcome: Progressing  Flowsheets (Taken 03/26/2023 0301)  Day of Discharge:   After Visit Summary (  Discharge Instuctions) with medications   CHF Warning Zones and when to call for help   Follow-up appointments   Daily Standing Weights   Low Sodium Diet     Problem:  Compromised Activity/Mobility  Goal: Activity/Mobility Interventions  Outcome: Progressing  Flowsheets (Taken 03/26/2023 2009)  Activity/Mobility Interventions: Pad bony prominences, TAP Seated positioning system when OOB, Promote PMP, Reposition q 2 hrs / turn clock, Offload heels     Problem: Compromised Sensory Perception  Goal: Sensory Perception Interventions  Outcome: Progressing  Flowsheets (Taken 03/26/2023 2009)  Sensory Perception Interventions: Offload heels, Pad bony prominences, Reposition q 2hrs/turn Clock, Q2 hour skin assessment under devices if present     Problem: Compromised Moisture  Goal: Moisture level Interventions  Outcome: Progressing  Flowsheets (Taken 03/26/2023 2009)  Moisture level Interventions: Moisture wicking products, Moisture barrier cream     Problem: Compromised Nutrition  Goal: Nutrition Interventions  Outcome: Progressing  Flowsheets (Taken 03/26/2023 2009)  Nutrition Interventions: Discuss nutrition at Rounds, I&Os, Document % meal eaten, Daily weights     Problem: Compromised Friction/Shear  Goal: Friction and Shear Interventions  Outcome: Progressing  Flowsheets (Taken 03/26/2023 2009)  Friction and Shear Interventions: Pad bony prominences, Off load heels, HOB 30 degrees or less unless contraindicated, Consider: TAP seated positioning, Heel foams     Problem: Moderate/High Fall Risk Score >5  Goal: Patient will remain free of falls  Outcome: Progressing  Flowsheets (Taken 03/26/2023 2009)  Moderate Risk (6-13):   MOD- Consider video monitoring   MOD-Perform dangle, stand, walk (DSW) prior to mobilization   MOD-Request PT/OT consult order for patients with gait/mobility impairment   MOD-Utilize diversion activities   MOD-Remain with patient during toileting   MOD-Floor mat at bedside (where available) if appropriate   MOD-Consider activation of bed alarm if appropriate

## 2023-03-27 NOTE — Progress Notes (Addendum)
 Quick Doc  [facility]   Patient Name: [facility]   Attending Physician: Nitta, Bradley H, MD   Today's date:   03/27/2023 LOS: 5 days   Expected Discharge Date      Quick  Assessment:                                                              ReAdmit Risk Score: 12.15    CM Comments: 2/27: EGD/EUS - esophageal candidiasis & pancreatic mass, working on symptom mgmt.  DISPO: home w/family - f/u outpatient for pathology - MCD pending    CM received notification that patient is requesting to speak with CM.  Patient requested update on Medicaid application.  CM informed process takes 30-45 days to complete.  Patient inquired about [facility] and CM noted application was left with patient to complete.  Patient reported she has  not received an application.  CM will follow up with [facility]. Patient requested assistance with finding an apartment and funds to cover monthly payments.  CM informed does not have resources and that once she receives Medicaid, she can check in with SW.  CM offered to locate temporary housing at shelter and patient declined.    CM contacted Legrand Overland with [employer] and requested they follow up with patient.   ____________________  Patient to discharge back home with family/friends.  Working on symptom management and adjusting medications.                                                                                Provider Notifications:

## 2023-03-27 NOTE — Progress Notes (Signed)
 MEDICINE PROGRESS NOTE    Date Time: 03/27/23 8:10 AM  Patient Name: Valerie May  Attending Physician: Valerie Shanholtzer H, MD    Hospital Course/Interim Summary:   Valerie May is a 53 y.o. female with a history of asthma, eosinophilia, mild bronchiectasis, recurrent pneumonia/bronchitis, migraine headaches, prolonged admit in [place]  for COVID-19 in 2021, sinus disease, GERD, DM2 who presented to [facility] 03/21/2023 with intractable nausea and vomiting, dyspnea, chest abdominal pain, acute on chronic pain in right arm, headache.  Early course complicated by acute respiratory acidosis felt to be due to IV opiates.  An MRI showed concern for pancreatic cancer.  She is undergoing EGD/EUS 2/26.    Assessment:     Pancreatic lesion, concern for neoplasia  Intractable nausea, vomiting  Generalized diffuse pain, headache, abdominal pain, chest pain, unclear etiology  Esophageal candidiasis (based on appearance on scope, follow-up brushing to confirm this is not eosinophilic esophagitis)  Acute metabolic encephalopathy, resolved  Transaminitis, presumably DILI related to fluconazole   Acute hypercapnic and hypoxic respiratory failure  Asthma with bronchiectasis  History of migraines  Mild transaminitis, resolved  Noninfectious SIRS  GERD  NIDDM  Right renal lesion  MDD/GAD  Class II obesity based on BMI calculation    Plan:   EGD/EUS 2/26 demonstrated likely esophageal candidiasis.  Continue empiric fluconazole  400 mg daily for 14 to 21-day course  Monitor for improvement in symptoms with treatment of esophageal candidiasis  Monitor transaminitis, consider discontinuing fluconazole   Negative HIV and hepatitis screen  Follow-up esophageal brushing  Follow-up pathology from EUS biopsy of pancreatic lesion  Analgesia and antiemetics as needed  Outpatient follow-up for right renal lesion  Bowel regimen  Started on venlafaxine  for major depression.  Also requested spiritual care consult    Safety Checklist:   DVT Prophylaxis:  Lovenox   Nutrition: Regular diet  Code Status: Full code      Subjective     CC: Generalized abdominal pain    Interval History/24 hour events/HPI/Subjective:   Lip swelling improved  Still with postprocedure abdominal pain  Feeling very depressed, demoralized  Review of Systems:     Negative except per HPI    Physical Exam:     VITAL SIGNS PHYSICAL EXAM   Temp:  [97.3 F (36.3 C)-98.8 F (37.1 C)] 98.4 F (36.9 C)  Heart Rate:  [97-107] 103  Resp Rate:  [17-24] 22  BP: (118-149)/(79-94) 118/79        Intake/Output Summary (Last 24 hours) at 03/27/2023 0810  Last data filed at 03/27/2023 0700  Gross per 24 hour   Intake 2360 ml   Output 400 ml   Net 1960 ml    Physical Exam  General: awake, alert, oriented x 3; no acute distress.  HEENT: perrla, eomi, sclera anicteric oropharynx clear without lesions, mucous membranes moist, edematous upper lip  Neck: supple, no LAD, no thyromegaly, no JVD, no carotid bruits  Cardiovascular: regular rate and rhythm, no murmurs, rubs, or gallops  Lungs: clear to auscultation bilaterally, without wheezing, rhonchi, or rales  Abdomen: soft, non-tender, non-distended; no palpable masses, no hepatosplenomegaly, normoactive bowel sounds, no rebound or guarding  Extremities: no clubbing, cyanosis, or edema  Neuro: cranial nerves grossly intact, strength 5/5 in upper and lower extremities, sensation intact  Skin: no rashes or lesions noted         Meds:     Medications were reviewed:    Labs:     Labs (last 72 hours):    Recent Labs  Lab 03/24/23  0423 03/23/23  0330   WBC 5.20 6.62   Hemoglobin 14.1 12.8   Hematocrit 43.8* 40.4   Platelet Count 308 250       Recent Labs   Lab 03/25/23  0318 03/22/23  1441   PT 11.0 11.8   INR 1.0 1.0   PTT  --  30       Microbiology, reviewed and are significant for:  Microbiology Results (last 15 days)       Procedure Component Value Units Date/Time    Respiratory Pathogen Panel With COVID-19, PCR [[id number]]     Order Status: Sent Specimen:  Nasopharyngeal Swab from Nasopharynx     COVID-19 and Influenza ([place]) (symptomatic) [[id number]]  (Normal) Collected: 03/21/23 0532    Order Status: Completed Specimen: Swab from Anterior Nares Updated: 03/21/23 0646     SARS-CoV-2 (COVID-19) RNA Not Detected     Influenza A RNA Not Detected     Influenza B RNA Not Detected    Narrative:      A result of Detected indicates POSITIVE for the presence of viral RNA  A result of Not Detected indicates NEGATIVE for the presence of viral RNA    Test performed using the Roche cobas [place] SARS-CoV-2 & Influenza A/B assay. This is a multiplex real-time RT-PCR assay for the detection of SARS-CoV-2, influenza A, and influenza B virus RNA. Viral nucleic acids may persist in vivo, independent of viability. Detection of viral nucleic acid does not imply the presence of infectious virus, or that virus nucleic acid is the cause of clinical symptoms. Negative results do not preclude SARS-CoV-2, influenza A, and/or influenza B infection and should not be used as the sole basis for diagnosis, treatment or other patient management decisions. Invalid results may be due to inhibiting substances in the specimen and recollection should occur.     Culture, Blood, Aerobic And Anaerobic [[id number]] Collected: 03/21/23 0506    Order Status: Completed Specimen: Blood, Venous Updated: 03/26/23 0900     Culture Blood No growth at 5 days              Imaging, reviewed and are significant for:  MRI Abdomen W WO Contrast MRCP    Result Date: 03/24/2023   Exam quality degraded by artifact arising from motion. The following observations are made within these confines. 1.Pancreatic head 1.2 cm enhancing lesion, suspected neoplasm. This lesion appears confined of the pancreas and demonstrates no major vascular interfaces. Recommend EUS for further evaluation. 2.No findings suspicious for metastatic disease within the imaged abdomen. 3.Mid right renal 1.7 cm cyst, recently demonstrating intermediate  density by CT and is benign. 4.Hepatic steatosis. 5.Colonic diverticulosis. Valerie Miyamoto, MD 03/24/2023 8:49 AM    CT Head WO Contrast    Result Date: 03/22/2023   No acute intracranial abnormality. Debby ONEIDA Sewer, MD 03/22/2023 7:28 PM    Shoulder Right 2+ Views    Result Date: 03/21/2023  1. No fracture of the shoulder. 2. Old healed fractures of the right lateral third and fourth ribs. Derick Clap, MD 03/21/2023 2:09 PM    CT Abd/Pelvis with IV Contrast only    Result Date: 03/21/2023  1.No acute abnormality within the chest, abdomen, or pelvis. 2.No findings of acute pulmonary embolism within the limitations stated above. 3.Hyperdense pancreatic head lesion. Further evaluation with MRI/MRCP is recommended. 4.Mildly hyperdense right renal lesion, possible hemorrhagic/proteinaceous cyst. This can also be further evaluated on MRI. 5.Additional chronic findings as above. Sue CHRISTELLA Fila, DO  03/21/2023 8:51 AM    CT Angio Chest (PE study)    Result Date: 03/21/2023  1.No acute abnormality within the chest, abdomen, or pelvis. 2.No findings of acute pulmonary embolism within the limitations stated above. 3.Hyperdense pancreatic head lesion. Further evaluation with MRI/MRCP is recommended. 4.Mildly hyperdense right renal lesion, possible hemorrhagic/proteinaceous cyst. This can also be further evaluated on MRI. 5.Additional chronic findings as above. Sue CHRISTELLA Fila, DO 03/21/2023 8:51 AM    Chest AP Portable    Result Date: 03/21/2023  No acute pulmonary process. Odella Edelson, MD 03/21/2023 5:31 AM    Recent Labs   Lab 03/27/23  0350 03/26/23  0308   Sodium 138 139   Potassium 4.2 4.2   Chloride 103 103   CO2 25 26   BUN 9 11   Creatinine 0.6 0.6   Calcium  9.6 9.2   Albumin 3.8 3.8   Protein, Total 7.1 6.7   Bilirubin, Total 0.5 0.5   Alkaline Phosphatase 148* 117   ALT 96* 68*   AST (SGOT) 55* 34   Glucose 109* 147*        Signed by: Adine VEAR Fort, MD

## 2023-03-28 ENCOUNTER — Inpatient Hospital Stay: Admitting: Radiology

## 2023-03-28 LAB — HEPATIC FUNCTION PANEL (LFT)
ALT: 68 U/L — ABNORMAL HIGH (ref <=55)
AST (SGOT): 28 U/L (ref <=41)
Albumin/Globulin Ratio: 1.2 (ref 0.9–2.2)
Albumin: 3.9 g/dL (ref 3.5–5.0)
Alkaline Phosphatase: 135 U/L — ABNORMAL HIGH (ref 37–117)
Bilirubin Direct: 0.1 mg/dL (ref 0.0–0.5)
Bilirubin Indirect: 0.4 mg/dL (ref 0.2–1.0)
Bilirubin, Total: 0.5 mg/dL (ref 0.2–1.2)
Globulin: 3.2 g/dL (ref 2.0–3.6)
Protein, Total: 7.1 g/dL (ref 6.0–8.3)

## 2023-03-28 LAB — ECG 12-LEAD
Atrial Rate: 91 {beats}/min
IHS MUSE NARRATIVE AND IMPRESSION: NORMAL
P Axis: 27 degrees
P-R Interval: 120 ms
Q-T Interval: 368 ms
QRS Duration: 104 ms
QTC Calculation (Bezet): 452 ms
R Axis: 18 degrees
T Axis: 31 degrees
Ventricular Rate: 91 {beats}/min

## 2023-03-28 LAB — HIGH SENSITIVITY TROPONIN-I WITH DELTA: hs Troponin: <2.7 ng/L (ref <=14.0)

## 2023-03-28 LAB — WHOLE BLOOD GLUCOSE POCT
Whole Blood Glucose POCT: 130 mg/dL — ABNORMAL HIGH (ref 70–100)
Whole Blood Glucose POCT: 143 mg/dL — ABNORMAL HIGH (ref 70–100)
Whole Blood Glucose POCT: 122 mg/dL — ABNORMAL HIGH (ref 70–100)
Whole Blood Glucose POCT: 135 mg/dL — ABNORMAL HIGH (ref 70–100)

## 2023-03-28 LAB — HIGH SENSITIVITY TROPONIN-I: hs Troponin: <2.7 ng/L (ref <=14.0)

## 2023-03-28 NOTE — Plan of Care (Signed)
 Patient had one episode of high BP 144/92 MAP 109, re-checked a little later and it went back down to 128/82 MAP 98.    Nursing progress note:   Neuro: A&Ox4, afebrile  Vitals: Vital signs stable on 2L NC, increased from 1L to 2L at night because pt was desatting to 89%  Integumentary: Scattered scars, scattered bruising, scattered tattoos, generalized non-pitting edema, swollen upper lip after EGD  Respiratory: Diminished lung sounds, patient denies SOB, encouraged use of IS  Cardio: RRR, No telemetry, patient denies chest pain  GU: Voiding freely  GI: Regular diet, +N/-V; +flatus, LBM 2/28 per pt   IV: 20G LAC SL.   Pain control: Patient reported 7/10 pain in her stomach, but did not request PRN pain meds. Patient educated on pain management.  Mobility: SBA x1  Safety: Safety precautions in place, call bell within reach     POC: Pain mngmt, anti-fungal, magic mouthwash, control nausea.        Problem: Pain interferes with ability to perform ADL  Goal: Pain at adequate level as identified by patient  Outcome: Progressing  Flowsheets (Taken 03/27/2023 1956)  Pain at adequate level as identified by patient:   Identify patient comfort function goal   Assess for risk of opioid induced respiratory depression, including snoring/sleep apnea. Alert healthcare team of risk factors identified.   Assess pain on admission, during daily assessment and/or before any as needed intervention(s)   Reassess pain within 30-60 minutes of any procedure/intervention, per Pain Assessment, Intervention, Reassessment (AIR) Cycle   Evaluate if patient comfort function goal is met   Evaluate patient's satisfaction with pain management progress   Offer non-pharmacological pain management interventions     Problem: Side Effects from Pain Analgesia  Goal: Patient will experience minimal side effects of analgesic therapy  Outcome: Progressing  Flowsheets (Taken 03/27/2023 1956)  Patient will experience minimal side effects of analgesic therapy:    Monitor/assess patient's respiratory status (RR depth, effort, breath sounds)   Assess for changes in cognitive function   Prevent/manage side effects per LIP orders (i.e. nausea, vomiting, pruritus, constipation, urinary retention, etc.)   Evaluate for opioid-induced sedation with appropriate assessment tool (i.e. POSS)     Problem: Day of Admission - Heart Failure  Goal: Heart Failure Admission  Outcome: Progressing  Flowsheets (Taken 03/26/2023 0301)  Heart Failure Admission:   Strict Intake/Output   Assess for swelling/edema and document     Problem: Everyday - Heart Failure  Goal: Stable Vital Signs and Fluid Balance  Outcome: Progressing  Flowsheets (Taken 03/27/2023 0354)  Stable Vital Signs and Fluid Balance:   Monitor labs and report abnormalities to physician   Daily Standing Weights in the morning using the same scale, after using the bathroom and before breadfast.  If unable to stand, zero the bed and use the bed scale   Assess for swelling/edema   Strict Intake/Output  Goal: Mobility/Activity is Maintained at Optimal Level for Patient  Outcome: Progressing  Flowsheets (Taken 03/27/2023 0354)  Mobility/Activity is Maintained at Optimal Level for Patient:   Increase mobility as tolerated/progressive mobility protocol   Assess for changes in respiratory status, level of consciousness and/or development of fatigue  Goal: Nutritional Intake is Adequate  Outcome: Progressing  Flowsheets (Taken 03/26/2023 0301)  Nutritional Intake is Adequate: Assess appetite,anorexia and amount of meal/food tolerated  Goal: Teaching-Using CHF Warning Zones and Educational Videos  Outcome: Progressing  Flowsheets (Taken 03/26/2023 0301)  Teaching-Using CHF Warning Zones and Educational Videos:   Signs &  Symptoms of CHF   Medications     Problem: Compromised Activity/Mobility  Goal: Activity/Mobility Interventions  Outcome: Progressing  Flowsheets (Taken 03/27/2023 1956)  Activity/Mobility Interventions: Pad bony prominences, TAP  Seated positioning system when OOB, Promote PMP, Reposition q 2 hrs / turn clock, Offload heels     Problem: Compromised Sensory Perception  Goal: Sensory Perception Interventions  Outcome: Progressing  Flowsheets (Taken 03/27/2023 1956)  Sensory Perception Interventions: Offload heels, Pad bony prominences, Reposition q 2hrs/turn Clock, Q2 hour skin assessment under devices if present     Problem: Compromised Moisture  Goal: Moisture level Interventions  Outcome: Progressing  Flowsheets (Taken 03/27/2023 1956)  Moisture level Interventions: Moisture wicking products, Moisture barrier cream     Problem: Compromised Nutrition  Goal: Nutrition Interventions  Outcome: Progressing  Flowsheets (Taken 03/27/2023 1956)  Nutrition Interventions: Discuss nutrition at Rounds, I&Os, Document % meal eaten, Daily weights     Problem: Compromised Friction/Shear  Goal: Friction and Shear Interventions  Outcome: Progressing  Flowsheets (Taken 03/27/2023 1956)  Friction and Shear Interventions: Pad bony prominences, Off load heels, HOB 30 degrees or less unless contraindicated, Consider: TAP seated positioning, Heel foams     Problem: Moderate/High Fall Risk Score >5  Goal: Patient will remain free of falls  Outcome: Progressing  Flowsheets (Taken 03/28/2023 0232)  Moderate Risk (6-13): MOD-Floor mat at bedside (where available) if appropriate

## 2023-03-28 NOTE — Progress Notes (Signed)
 MEDICINE PROGRESS NOTE    Date Time: 03/28/23 1:39 PM  Patient Name: [facility] D  Attending Physician: Ritvik Mczeal H, MD    Hospital Course/Interim Summary:   Valerie May is a 53 y.o. female with a history of asthma, eosinophilia, mild bronchiectasis, recurrent pneumonia/bronchitis, migraine headaches, prolonged admit in [place]  for COVID-19 in 2021, sinus disease, GERD, DM2 who presented to [facility] 03/21/2023 with intractable nausea and vomiting, dyspnea, chest abdominal pain, acute on chronic pain in right arm, headache.  Early course complicated by acute respiratory acidosis felt to be due to IV opiates.  An MRI showed concern for pancreatic cancer.  She is undergoing EGD/EUS 2/26.    Assessment:     Pancreatic lesion, concern for neoplasia  Intractable nausea, vomiting  Generalized diffuse pain, headache, abdominal pain, chest pain, unclear etiology  Esophageal candidiasis (based on appearance on scope, follow-up brushing to confirm this is not eosinophilic esophagitis)  Acute metabolic encephalopathy, resolved  Transaminitis, presumably DILI related to fluconazole   Acute hypercapnic and hypoxic respiratory failure  Asthma with bronchiectasis  History of migraines  Mild transaminitis, resolved  Noninfectious SIRS  GERD  NIDDM  Right renal lesion  MDD/GAD  Class II obesity based on BMI calculation    Plan:   EGD/EUS 2/26 demonstrated likely esophageal candidiasis.  Continue empiric fluconazole  400 mg daily for 14 to 21-day course  Monitor for improvement in symptoms with treatment of esophageal candidiasis  Monitor transaminitis - f/u LFTs today, consider discontinuing fluconazole   Negative HIV and hepatitis screen  Follow-up esophageal brushing  Follow-up pathology from EUS biopsy of pancreatic lesion  Analgesia and antiemetics as needed  Outpatient follow-up for right renal lesion  Bowel regimen  Continue venlafaxine  for major depression.  Also requested spiritual care consult  Chest pain this morning,  seems non-cardiac, EKG, CXR, trop reassuring    Safety Checklist:   DVT Prophylaxis: Lovenox   Nutrition: Regular diet  Code Status: Full code      Subjective     CC: Generalized abdominal pain    Interval History/24 hour events/HPI/Subjective:   Chest pain this morning  Continues to have pain almost everywhere  However lip swelling and throat pain a bit better  Review of Systems:     Negative except per HPI    Physical Exam:     VITAL SIGNS PHYSICAL EXAM   Temp:  [97.5 F (36.4 C)-98.4 F (36.9 C)] 97.7 F (36.5 C)  Heart Rate:  [87-101] 95  Resp Rate:  [0-24] 22  BP: (106-144)/(66-92) 127/71        Intake/Output Summary (Last 24 hours) at 03/28/2023 1339  Last data filed at 03/28/2023 1221  Gross per 24 hour   Intake 2540 ml   Output 250 ml   Net 2290 ml    Physical Exam  General: awake, alert, oriented x 3; no acute distress.  HEENT: perrla, eomi, sclera anicteric oropharynx clear without lesions, mucous membranes moist, edematous upper lip  Neck: supple, no LAD, no thyromegaly, no JVD, no carotid bruits  Cardiovascular: regular rate and rhythm, no murmurs, rubs, or gallops  Lungs: clear to auscultation bilaterally, without wheezing, rhonchi, or rales  Abdomen: soft, non-tender, non-distended; no palpable masses, no hepatosplenomegaly, normoactive bowel sounds, no rebound or guarding  Extremities: no clubbing, cyanosis, or edema  Neuro: cranial nerves grossly intact, strength 5/5 in upper and lower extremities, sensation intact  Skin: no rashes or lesions noted         Meds:  Medications were reviewed:    Labs:     Labs (last 72 hours):    Recent Labs   Lab 03/24/23  0423 03/23/23  0330   WBC 5.20 6.62   Hemoglobin 14.1 12.8   Hematocrit 43.8* 40.4   Platelet Count 308 250       Recent Labs   Lab 03/25/23  0318 03/22/23  1441   PT 11.0 11.8   INR 1.0 1.0   PTT  --  30       Microbiology, reviewed and are significant for:  Microbiology Results (last 15 days)       Procedure Component Value Units Date/Time     Respiratory Pathogen Panel With COVID-19, PCR [[id number]]     Order Status: Sent Specimen: Nasopharyngeal Swab from Nasopharynx     COVID-19 and Influenza (Liat) (symptomatic) [[id number]]  (Normal) Collected: 03/21/23 0532    Order Status: Completed Specimen: Swab from Anterior Nares Updated: 03/21/23 0646     SARS-CoV-2 (COVID-19) RNA Not Detected     Influenza A RNA Not Detected     Influenza B RNA Not Detected    Narrative:      A result of Detected indicates POSITIVE for the presence of viral RNA  A result of Not Detected indicates NEGATIVE for the presence of viral RNA    Test performed using the Roche cobas Liat SARS-CoV-2 & Influenza A/B assay. This is a multiplex real-time RT-PCR assay for the detection of SARS-CoV-2, influenza A, and influenza B virus RNA. Viral nucleic acids may persist in vivo, independent of viability. Detection of viral nucleic acid does not imply the presence of infectious virus, or that virus nucleic acid is the cause of clinical symptoms. Negative results do not preclude SARS-CoV-2, influenza A, and/or influenza B infection and should not be used as the sole basis for diagnosis, treatment or other patient management decisions. Invalid results may be due to inhibiting substances in the specimen and recollection should occur.     Culture, Blood, Aerobic And Anaerobic [[id number]] Collected: 03/21/23 0506    Order Status: Completed Specimen: Blood, Venous Updated: 03/26/23 0900     Culture Blood No growth at 5 days              Imaging, reviewed and are significant for:  XR Chest AP Portable    Result Date: 03/28/2023   No acute cardiopulmonary disease. Reena D'Heureux, MD 03/28/2023 10:21 AM    MRI Abdomen W WO Contrast MRCP    Result Date: 03/24/2023   Exam quality degraded by artifact arising from motion. The following observations are made within these confines. 1.Pancreatic head 1.2 cm enhancing lesion, suspected neoplasm. This lesion appears confined of the pancreas and  demonstrates no major vascular interfaces. Recommend EUS for further evaluation. 2.No findings suspicious for metastatic disease within the imaged abdomen. 3.Mid right renal 1.7 cm cyst, recently demonstrating intermediate density by CT and is benign. 4.Hepatic steatosis. 5.Colonic diverticulosis. Elwood Miyamoto, MD 03/24/2023 8:49 AM    CT Head WO Contrast    Result Date: 03/22/2023   No acute intracranial abnormality. Debby ONEIDA Sewer, MD 03/22/2023 7:28 PM    Recent Labs   Lab 03/27/23  0350 03/26/23  0308   Sodium 138 139   Potassium 4.2 4.2   Chloride 103 103   CO2 25 26   BUN 9 11   Creatinine 0.6 0.6   Calcium  9.6 9.2   Albumin 3.8 3.8   Protein, Total 7.1 6.7  Bilirubin, Total 0.5 0.5   Alkaline Phosphatase 148* 117   ALT 96* 68*   AST (SGOT) 55* 34   Glucose 109* 147*        Signed by: Adine VEAR Fort, MD

## 2023-03-28 NOTE — PT Progress Note (Signed)
 [facility]   Physical Therapy Cancellation Note      Patient:  Valerie May MRN#:  [id number]  Unit:  [facility] Room/Bed:  T671/T671.98    03/28/2023  Time: 909am      PT Cancellation: Order     PT Order Cancellation Reason: Other (comment required) (pt denies necessity of further PT, ambulating & showering w/ RNs declines stair negotiation assessment & training in prep for d/c home.)  Will continue to monitor & return as schedule permits.    Dania Bring, PT, DPT, Pager [phone], 03/28/2023, 9:10 AM

## 2023-03-28 NOTE — Plan of Care (Addendum)
 NURSING PROGRESS NOTE  Neuro: A&Ox4  Resp: Lung sounds diminished, encouraged use of IS often, occasional moist cough with yellow drainage  HEENT: slightly painful to swallow, cracked lips  Cardiac: No telemetry, patient denies chest pain at end of shift  Integumentary: scattered scars and bruising, abrasions to bilateral knees, scattered tattoos  GU: voiding freely, adequate UOP  GI: regular diet, +N/-V, (+) flatus, LBM: 3/1  IV: PIV to LFA SL  Pain control: mostly abd pain of 10/10, morphine  providing relief. Patient educated on pain management.  Mobility: standby assist  Safety: Safety precautions in place: bed in lowest position, fall mat in place, call bell within reach.    Vitals: VSS on 1 L NC    Intermittent chest pain this shift, chest xray, ECG, troponin taken, all normal. Strict I/O.    Problem: Pain interferes with ability to perform ADL  Goal: Pain at adequate level as identified by patient  Outcome: Progressing     Problem: Side Effects from Pain Analgesia  Goal: Patient will experience minimal side effects of analgesic therapy  Outcome: Progressing     Problem: Day of Admission - Heart Failure  Goal: Heart Failure Admission  Outcome: Progressing     Problem: Everyday - Heart Failure  Goal: Stable Vital Signs and Fluid Balance  Outcome: Progressing  Goal: Mobility/Activity is Maintained at Optimal Level for Patient  Outcome: Progressing  Goal: Nutritional Intake is Adequate  Outcome: Progressing  Goal: Teaching-Using CHF Warning Zones and Educational Videos  Outcome: Progressing     Problem: Day of Discharge - Heart Failure  Goal: Discharge Education  Outcome: Progressing     Problem: Compromised Activity/Mobility  Goal: Activity/Mobility Interventions  Outcome: Progressing     Problem: Compromised Sensory Perception  Goal: Sensory Perception Interventions  Outcome: Progressing     Problem: Compromised Moisture  Goal: Moisture level Interventions  Outcome: Progressing     Problem: Compromised  Nutrition  Goal: Nutrition Interventions  Outcome: Progressing     Problem: Compromised Friction/Shear  Goal: Friction and Shear Interventions  Outcome: Progressing     Problem: Moderate/High Fall Risk Score >5  Goal: Patient will remain free of falls  Outcome: Progressing

## 2023-03-29 LAB — WHOLE BLOOD GLUCOSE POCT
Whole Blood Glucose POCT: 108 mg/dL — ABNORMAL HIGH (ref 70–100)
Whole Blood Glucose POCT: 103 mg/dL — ABNORMAL HIGH (ref 70–100)
Whole Blood Glucose POCT: 150 mg/dL — ABNORMAL HIGH (ref 70–100)
Whole Blood Glucose POCT: 135 mg/dL — ABNORMAL HIGH (ref 70–100)

## 2023-03-29 MED ORDER — ONDANSETRON HCL 4 MG/2ML IJ SOLN
4.0000 mg | INTRAMUSCULAR | Status: AC | PRN
Start: 2023-03-29 — End: ?
  Administered 2023-03-31 – 2023-04-07 (×11): 4 mg via INTRAVENOUS
  Filled 2023-03-29 (×11): qty 2

## 2023-03-29 MED ORDER — OXYCODONE HCL 5 MG PO TABS
5.0000 mg | ORAL_TABLET | Freq: Four times a day (QID) | ORAL | Status: DC | PRN
Start: 2023-03-29 — End: 2023-03-29
  Administered 2023-03-29: 5 mg via ORAL
  Filled 2023-03-29: qty 1

## 2023-03-29 MED ORDER — OXYCODONE HCL 10 MG PO TABS
10.0000 mg | ORAL_TABLET | Freq: Four times a day (QID) | ORAL | Status: DC | PRN
Start: 2023-03-29 — End: 2023-04-01
  Administered 2023-03-29 – 2023-03-31 (×4): 10 mg via ORAL
  Filled 2023-03-29 (×5): qty 1

## 2023-03-29 MED ORDER — MORPHINE SULFATE 2 MG/ML IJ/IV SOLN (WRAP)
2.0000 mg | Status: DC | PRN
Start: 2023-03-29 — End: 2023-03-31
  Administered 2023-03-30 – 2023-03-31 (×2): 2 mg via INTRAVENOUS
  Filled 2023-03-29 (×2): qty 1

## 2023-03-29 MED ORDER — ALUM & MAG HYDROXIDE-SIMETH 200-200-20 MG/5ML PO SUSP
30.0000 mL | ORAL | Status: DC | PRN
Start: 2023-03-29 — End: 2023-04-07
  Administered 2023-03-29 – 2023-04-06 (×5): 30 mL via ORAL
  Filled 2023-03-29 (×5): qty 30

## 2023-03-29 MED ORDER — ONDANSETRON 4 MG PO TBDP
8.0000 mg | ORAL_TABLET | ORAL | Status: DC | PRN
Start: 2023-03-29 — End: 2023-04-07
  Administered 2023-03-29 – 2023-04-01 (×5): 8 mg via ORAL
  Filled 2023-03-29 (×8): qty 2

## 2023-03-29 NOTE — Plan of Care (Signed)
 Nursing Shift Note    Refused CPAP overnight, on 1L NC    Neuro: A&O x4, calm and cooperative, able to make needs known and follows commands. Stable vital signs.  Cardiac: Regular heart rate, not on telemetry monitoring, denies chest pain.  Pulmonary: Regular, unlabored respirations, bilateral clear lungs sounds, 1L NC. Pt refuse CPAP for night. Denies shortness of breath. PRN tessalon  given for cough.  Skin: Abrasions bilateral knees. Blanchable redness to bilateral heels. Scattered bruising, scars, and tattoos.  Neurovascular: Generalized non pitting edema, moderate pulses.   Gastrointestinal/Genitourinary:  Continent of bowel and bladder. X1 BM overnight, last BM 3/1. Abdomen soft and rounded, tenderness and hyperactive bowel sounds. Pt endorses nausea but refused scheduled reglan , PRN zofran  provided.  Diet: Regular diet, able to feed self, takes  meds whole.  Pain: Severe abdominal pain, minimal relief with PRN IV morphine .  Mobility:  Independent. Mobility devices: none. Able to turn self in bed.  Safety: Low fall risk. Fall precautions in place: bed in lowest position, fall mat in place, call light is within reach.    Active lines :   Patient Lines/Drains/Airways Status       Active Lines, Drains and Airways       Name Placement date Placement time Site Days    Peripheral IV 03/26/23 20 G Standard Anterior;Distal;Left Forearm 03/26/23  0550  Forearm  2                    Vitals:    03/28/23 1617 03/28/23 1929 03/29/23 0015 03/29/23 0020   BP: 114/77 122/78 115/73    Pulse: 89 99 85    Resp: 22 17 17     Temp: 98.4 F (36.9 C) 98.1 F (36.7 C) 98.8 F (37.1 C)    TempSrc: Oral Oral Axillary    SpO2: 91% 94% 95% 97%   Weight:       Height:             Intake/Output Summary (Last 24 hours) at 03/29/2023 0346  Last data filed at 03/29/2023 0014  Gross per 24 hour   Intake 2512 ml   Output 2150 ml   Net 362 ml       Problem: Pain interferes with ability to perform ADL  Goal: Pain at adequate level as identified by  patient  Outcome: Progressing     Problem: Side Effects from Pain Analgesia  Goal: Patient will experience minimal side effects of analgesic therapy  Outcome: Progressing     Problem: Day of Admission - Heart Failure  Goal: Heart Failure Admission  Outcome: Progressing     Problem: Everyday - Heart Failure  Goal: Stable Vital Signs and Fluid Balance  Outcome: Progressing  Goal: Mobility/Activity is Maintained at Optimal Level for Patient  Outcome: Progressing  Goal: Nutritional Intake is Adequate  Outcome: Progressing  Goal: Teaching-Using CHF Warning Zones and Educational Videos  Outcome: Progressing     Problem: Day of Discharge - Heart Failure  Goal: Discharge Education  Outcome: Progressing     Problem: Compromised Activity/Mobility  Goal: Activity/Mobility Interventions  Outcome: Progressing     Problem: Compromised Sensory Perception  Goal: Sensory Perception Interventions  Outcome: Progressing     Problem: Compromised Moisture  Goal: Moisture level Interventions  Outcome: Progressing     Problem: Compromised Nutrition  Goal: Nutrition Interventions  Outcome: Progressing     Problem: Compromised Friction/Shear  Goal: Friction and Shear Interventions  Outcome: Progressing     Problem: Moderate/High  Fall Risk Score >5  Goal: Patient will remain free of falls  Outcome: Progressing

## 2023-03-29 NOTE — Progress Notes (Signed)
 Chaplain Service      Background:  Visit Type: Initial was made by Chaplain with patient, Valerie May, based on Source: Chaplain Initiated.  Present at Visit: Patient.  Spiritual Care Provided to: patient.    .    Summary:  Spiritual Care Interventions: Provided emotional support, Provided reflective and compassionate listening, Provided comfort, encouragement,affirmation, Provided prayer  Reason for Visit: Spiritual Support, Sacramental/ritual, Prayer Request   Spiritual Care Outcomes: Patient expressed appreciation of visit      Fr. Koren Caldron  [facility]   Ext [phone] / Pager ID # [phone]  For emergencies, page pager [phone]

## 2023-03-29 NOTE — Progress Notes (Signed)
 MEDICINE PROGRESS NOTE    Date Time: 03/29/23 8:24 AM  Patient Name: Valerie May  Attending Physician: Neesha Langton H, MD    Hospital Course/Interim Summary:   Valerie May is a 53 y.o. female with a history of asthma, eosinophilia, mild bronchiectasis, recurrent pneumonia/bronchitis, migraine headaches, prolonged admit in [place]  for COVID-19 in 2021, sinus disease, GERD, DM2 who presented to [facility] 03/21/2023 with intractable nausea and vomiting, dyspnea, chest abdominal pain, acute on chronic pain in right arm, headache.  Early course complicated by acute respiratory acidosis felt to be due to IV opiates.  An MRI showed concern for pancreatic cancer.  She is undergoing EGD/EUS 2/26.    Assessment:     Pancreatic lesion, concern for neoplasia  Intractable nausea, vomiting  Generalized diffuse pain, headache, abdominal pain, chest pain, unclear etiology  Esophageal candidiasis (based on appearance on scope, follow-up brushing to confirm this is not eosinophilic esophagitis)  Acute metabolic encephalopathy, resolved  Transaminitis, presumably DILI related to fluconazole   Acute hypercapnic and hypoxic respiratory failure  Asthma with bronchiectasis  History of migraines  Mild transaminitis, resolved  Noninfectious SIRS  GERD  NIDDM  Right renal lesion  MDD/GAD  Class II obesity based on BMI calculation    Plan:   EGD/EUS 2/26 demonstrated likely esophageal candidiasis.  Continue empiric fluconazole  400 mg daily for 14 to 21-day course  Monitor for improvement in symptoms with treatment of esophageal candidiasis  Monitor transaminitis - f/u improving so continue fluconazole   Negative HIV and hepatitis screen  Follow-up esophageal brushing  Follow-up pathology from EUS biopsy of pancreatic lesion  Analgesia and antiemetics as needed - discussed trial to take PO PRNs in anticipation of discharge soon  Outpatient follow-up for right renal lesion  Bowel regimen  Continue venlafaxine  for major depression.  Also  requested spiritual care consult    Safety Checklist:   DVT Prophylaxis: Lovenox   Nutrition: Regular diet  Code Status: Full code      Subjective     CC: Generalized abdominal pain    Interval History/24 hour events/HPI/Subjective:   Continue to have multifocal pain  However lip swelling and throat pain a bit better  Review of Systems:     Negative except per HPI    Physical Exam:     VITAL SIGNS PHYSICAL EXAM   Temp:  [97.5 F (36.4 C)-98.8 F (37.1 C)] 98.4 F (36.9 C)  Heart Rate:  [84-99] 95  Resp Rate:  [17-24] 17  BP: (101-142)/(66-92) 101/70        Intake/Output Summary (Last 24 hours) at 03/29/2023 0824  Last data filed at 03/29/2023 9376  Gross per 24 hour   Intake 2512 ml   Output 2750 ml   Net -238 ml    Physical Exam  General: awake, alert, oriented x 3; no acute distress.  HEENT: perrla, eomi, sclera anicteric oropharynx clear without lesions, mucous membranes moist, edematous upper lip  Neck: supple, no LAD, no thyromegaly, no JVD, no carotid bruits  Cardiovascular: regular rate and rhythm, no murmurs, rubs, or gallops  Lungs: clear to auscultation bilaterally, without wheezing, rhonchi, or rales  Abdomen: soft, non-tender, non-distended; no palpable masses, no hepatosplenomegaly, normoactive bowel sounds, no rebound or guarding  Extremities: no clubbing, cyanosis, or edema  Neuro: cranial nerves grossly intact, strength 5/5 in upper and lower extremities, sensation intact  Skin: no rashes or lesions noted         Meds:     Medications were reviewed:  Labs:     Labs (last 72 hours):    Recent Labs   Lab 03/24/23  0423 03/23/23  0330   WBC 5.20 6.62   Hemoglobin 14.1 12.8   Hematocrit 43.8* 40.4   Platelet Count 308 250       Recent Labs   Lab 03/25/23  0318 03/22/23  1441   PT 11.0 11.8   INR 1.0 1.0   PTT  --  30       Microbiology, reviewed and are significant for:  Microbiology Results (last 15 days)       Procedure Component Value Units Date/Time    Respiratory Pathogen Panel With COVID-19, PCR  [[id number]]     Order Status: Sent Specimen: Nasopharyngeal Swab from Nasopharynx     COVID-19 and Influenza (Liat) (symptomatic) [[id number]]  (Normal) Collected: 03/21/23 0532    Order Status: Completed Specimen: Swab from Anterior Nares Updated: 03/21/23 0646     SARS-CoV-2 (COVID-19) RNA Not Detected     Influenza A RNA Not Detected     Influenza B RNA Not Detected    Narrative:      A result of Detected indicates POSITIVE for the presence of viral RNA  A result of Not Detected indicates NEGATIVE for the presence of viral RNA    Test performed using the Roche cobas Liat SARS-CoV-2 & Influenza A/B assay. This is a multiplex real-time RT-PCR assay for the detection of SARS-CoV-2, influenza A, and influenza B virus RNA. Viral nucleic acids may persist in vivo, independent of viability. Detection of viral nucleic acid does not imply the presence of infectious virus, or that virus nucleic acid is the cause of clinical symptoms. Negative results do not preclude SARS-CoV-2, influenza A, and/or influenza B infection and should not be used as the sole basis for diagnosis, treatment or other patient management decisions. Invalid results may be due to inhibiting substances in the specimen and recollection should occur.     Culture, Blood, Aerobic And Anaerobic [[id number]] Collected: 03/21/23 0506    Order Status: Completed Specimen: Blood, Venous Updated: 03/26/23 0900     Culture Blood No growth at 5 days              Imaging, reviewed and are significant for:  XR Chest AP Portable    Result Date: 03/28/2023   No acute cardiopulmonary disease. Reena May'Heureux, MD 03/28/2023 10:21 AM    MRI Abdomen W WO Contrast MRCP    Result Date: 03/24/2023   Exam quality degraded by artifact arising from motion. The following observations are made within these confines. 1.Pancreatic head 1.2 cm enhancing lesion, suspected neoplasm. This lesion appears confined of the pancreas and demonstrates no major vascular interfaces. Recommend  EUS for further evaluation. 2.No findings suspicious for metastatic disease within the imaged abdomen. 3.Mid right renal 1.7 cm cyst, recently demonstrating intermediate density by CT and is benign. 4.Hepatic steatosis. 5.Colonic diverticulosis. Elwood Miyamoto, MD 03/24/2023 8:49 AM    CT Head WO Contrast    Result Date: 03/22/2023   No acute intracranial abnormality. Debby ONEIDA Sewer, MD 03/22/2023 7:28 PM    Recent Labs   Lab 03/28/23  1318 03/27/23  0350 03/26/23  0308   Sodium  --  138 139   Potassium  --  4.2 4.2   Chloride  --  103 103   CO2  --  25 26   BUN  --  9 11   Creatinine  --  0.6 0.6   Calcium   --  9.6 9.2   Albumin 3.9 3.8 3.8   Protein, Total 7.1 7.1 6.7   Bilirubin, Total 0.5 0.5 0.5   Alkaline Phosphatase 135* 148* 117   ALT 68* 96* 68*   AST (SGOT) 28 55* 34   Glucose  --  109* 147*        Signed by: Valerie VEAR Fort, MD

## 2023-03-29 NOTE — Plan of Care (Signed)
 NURSING PROGRESS NOTE  Neuro: A&Ox4  Resp: Lung sounds diminished, encouraged use of IS often, occasional moist cough with yellow/green drainage  HEENT: no more pain when swallowing  Cardiac: No telemetry  Integumentary: scattered scars and bruising, abrasions to bilateral knees, scattered tattoos  GU: voiding freely, adequate UOP  GI: regular diet, +N (zofran  disintegrating tablet given)/-V, (+) flatus, LBM: 3/2  IV: PIV to LFA SL  Pain control: mostly abd and leg pain of 10/10, morphine  provided relief but MD switched to PO oxycodone . Patient educated on pain management and weaning of IV meds for possible discharge.  Mobility: standby assist  Safety: Safety precautions in place: bed in lowest position, fall mat in place, call bell within reach.    Vitals: VSS on 1 L NC, soft BP at times     POC: Strict I/O. Wean off IV pain meds.    Problem: Pain interferes with ability to perform ADL  Goal: Pain at adequate level as identified by patient  Outcome: Progressing     Problem: Side Effects from Pain Analgesia  Goal: Patient will experience minimal side effects of analgesic therapy  Outcome: Progressing     Problem: Day of Admission - Heart Failure  Goal: Heart Failure Admission  Outcome: Progressing     Problem: Everyday - Heart Failure  Goal: Stable Vital Signs and Fluid Balance  Outcome: Progressing  Goal: Mobility/Activity is Maintained at Optimal Level for Patient  Outcome: Progressing  Goal: Nutritional Intake is Adequate  Outcome: Progressing  Goal: Teaching-Using CHF Warning Zones and Educational Videos  Outcome: Progressing     Problem: Day of Discharge - Heart Failure  Goal: Discharge Education  Outcome: Progressing     Problem: Compromised Activity/Mobility  Goal: Activity/Mobility Interventions  Outcome: Progressing     Problem: Compromised Sensory Perception  Goal: Sensory Perception Interventions  Outcome: Progressing     Problem: Compromised Moisture  Goal: Moisture level Interventions  Outcome:  Progressing     Problem: Compromised Nutrition  Goal: Nutrition Interventions  Outcome: Progressing     Problem: Compromised Friction/Shear  Goal: Friction and Shear Interventions  Outcome: Progressing     Problem: Moderate/High Fall Risk Score >5  Goal: Patient will remain free of falls  Outcome: Progressing

## 2023-03-30 LAB — COMPREHENSIVE METABOLIC PANEL
ALT: 47 U/L (ref <=55)
AST (SGOT): 26 U/L (ref <=41)
Albumin/Globulin Ratio: 0.9 (ref 0.9–2.2)
Albumin: 3.6 g/dL (ref 3.5–5.0)
Alkaline Phosphatase: 136 U/L — ABNORMAL HIGH (ref 37–117)
Anion Gap: 9 (ref 5.0–15.0)
BUN: 12 mg/dL (ref 7–21)
Bilirubin, Total: 0.4 mg/dL (ref 0.2–1.2)
CO2: 28 meq/L (ref 17–29)
Calcium: 9.7 mg/dL (ref 8.5–10.5)
Chloride: 96 meq/L — ABNORMAL LOW (ref 99–111)
Creatinine: 0.7 mg/dL (ref 0.4–1.0)
GFR: >60 mL/min/{1.73_m2} (ref >=60.0)
Globulin: 3.8 g/dL — ABNORMAL HIGH (ref 2.0–3.6)
Glucose: 126 mg/dL — ABNORMAL HIGH (ref 70–100)
Potassium: 4.3 meq/L (ref 3.5–5.3)
Protein, Total: 7.4 g/dL (ref 6.0–8.3)
Sodium: 133 meq/L — ABNORMAL LOW (ref 135–145)

## 2023-03-30 LAB — WHOLE BLOOD GLUCOSE POCT
Whole Blood Glucose POCT: 138 mg/dL — ABNORMAL HIGH (ref 70–100)
Whole Blood Glucose POCT: 124 mg/dL — ABNORMAL HIGH (ref 70–100)
Whole Blood Glucose POCT: 153 mg/dL — ABNORMAL HIGH (ref 70–100)
Whole Blood Glucose POCT: 131 mg/dL — ABNORMAL HIGH (ref 70–100)

## 2023-03-30 LAB — MEDICAL CYTOLOGY

## 2023-03-30 MED ORDER — LORAZEPAM 0.5 MG PO TABS
0.2500 mg | ORAL_TABLET | ORAL | Status: AC | PRN
Start: 2023-03-30 — End: ?
  Administered 2023-04-01 – 2023-04-03 (×3): 0.25 mg via ORAL
  Filled 2023-03-30 (×3): qty 1

## 2023-03-30 MED ORDER — LORAZEPAM 0.5 MG PO TABS
0.5000 mg | ORAL_TABLET | ORAL | Status: DC | PRN
Start: 2023-03-30 — End: 2023-03-30
  Administered 2023-03-30: 0.5 mg via ORAL
  Filled 2023-03-30: qty 1

## 2023-03-30 MED ORDER — FLUOCINONIDE 0.05 % EX CREA
TOPICAL_CREAM | Freq: Two times a day (BID) | CUTANEOUS | Status: AC
Start: 2023-03-30 — End: ?
  Filled 2023-03-30 (×2): qty 15

## 2023-03-30 MED ORDER — LUBRIFRESH P.M. OP OINT
TOPICAL_OINTMENT | OPHTHALMIC | Status: DC | PRN
Start: 2023-03-30 — End: 2023-04-07
  Filled 2023-03-30: qty 3.5

## 2023-03-30 NOTE — Progress Notes (Signed)
 MEDICINE PROGRESS NOTE    Date Time: 03/30/23 8:19 AM  Patient Name: Valerie May  Attending Physician: Kin Galbraith H, MD    Hospital Course/Interim Summary:   Valerie May is a 53 y.o. female with a history of asthma, eosinophilia, mild bronchiectasis, recurrent pneumonia/bronchitis, migraine headaches, prolonged admit in [place]  for COVID-19 in 2021, sinus disease, GERD, DM2 who presented to [facility] 03/21/2023 with intractable nausea and vomiting, dyspnea, chest abdominal pain, acute on chronic pain in right arm, headache.  Early course complicated by acute respiratory acidosis felt to be due to IV opiates.  An MRI showed concern for pancreatic cancer.  She underwent EGD/EUS 2/26. Biopsy of pancreatic lesion is pending. Esophageal brushing confirmed esophageal candidiasis and she has been on a course of fluconazole . She continues to have persistent migratory pains and nausea and we are attempting to wean IV medications so that she can discharge.    Assessment:     Pancreatic lesion, concern for neoplasia  Intractable nausea, vomiting  Generalized diffuse pain, headache, abdominal pain, chest pain, unclear etiology  Esophageal candidiasis   Acute metabolic encephalopathy, resolved  Transaminitis, presumably DILI related to fluconazole , though resolved and fluconazole  continued so unclear etiology  Acute hypercapnic and hypoxic respiratory failure  Asthma with bronchiectasis  History of migraines  Mild transaminitis, resolved  Noninfectious SIRS  GERD  NIDDM  Right renal lesion  MDD/GAD  Class II obesity based on BMI calculation    Plan:   EGD/EUS 2/26 demonstrated likely esophageal candidiasis - confirmed on brushing.  Continue fluconazole  400 mg daily for 14 to 21-day course  Monitor for improvement in symptoms with treatment of esophageal candidiasis  Negative HIV and hepatitis screen  Follow-up pathology from EUS biopsy of pancreatic lesion  Analgesia and antiemetics as needed - discussed trial to take PO  PRNs in anticipation of discharge soon  Outpatient follow-up for right renal lesion  Bowel regimen  Continue venlafaxine  for major depression, short course benzo for acute anxiety until SNRI kicks in    Safety Checklist:   DVT Prophylaxis: Lovenox   Nutrition: Regular diet  Code Status: Full code      Subjective     CC: Generalized abdominal pain    Interval History/24 hour events/HPI/Subjective:   Continues to have multifocal pain  No new complaints but she's anxious PO medications will not be effective at home  Review of Systems:     Negative except per HPI    Physical Exam:     VITAL SIGNS PHYSICAL EXAM   Temp:  [97.7 F (36.5 C)-99 F (37.2 C)] 99 F (37.2 C)  Heart Rate:  [91-103] 101  Resp Rate:  [14-23] 17  BP: (107-117)/(70-77) 111/75        Intake/Output Summary (Last 24 hours) at 03/30/2023 0819  Last data filed at 03/30/2023 0404  Gross per 24 hour   Intake 1792 ml   Output 2500 ml   Net -708 ml    Physical Exam  General: awake, alert, oriented x 3; no acute distress.  HEENT: perrla, eomi, sclera anicteric oropharynx clear without lesions, mucous membranes moist, edematous upper lip  Neck: supple, no LAD, no thyromegaly, no JVD, no carotid bruits  Cardiovascular: regular rate and rhythm, no murmurs, rubs, or gallops  Lungs: clear to auscultation bilaterally, without wheezing, rhonchi, or rales  Abdomen: soft, non-tender, non-distended; no palpable masses, no hepatosplenomegaly, normoactive bowel sounds, no rebound or guarding  Extremities: no clubbing, cyanosis, or edema  Neuro: cranial nerves grossly intact,  strength 5/5 in upper and lower extremities, sensation intact  Skin: no rashes or lesions noted         Meds:     Medications were reviewed:    Labs:     Labs (last 72 hours):    Recent Labs   Lab 03/24/23  0423   WBC 5.20   Hemoglobin 14.1   Hematocrit 43.8*   Platelet Count 308       Recent Labs   Lab 03/25/23  0318   PT 11.0   INR 1.0       Microbiology, reviewed and are significant  for:  Microbiology Results (last 15 days)       Procedure Component Value Units Date/Time    Respiratory Pathogen Panel With COVID-19, PCR [[id number]]     Order Status: Sent Specimen: Nasopharyngeal Swab from Nasopharynx     COVID-19 and Influenza (L[place]) (symptomatic) [[id number]]  (Normal) Collected: 03/21/23 0532    Order Status: Completed Specimen: Swab from Anterior Nares Updated: 03/21/23 0646     SARS-CoV-2 (COVID-19) RNA Not Detected     Influenza A RNA Not Detected     Influenza B RNA Not Detected    Narrative:      A result of Detected indicates POSITIVE for the presence of viral RNA  A result of Not Detected indicates NEGATIVE for the presence of viral RNA    Test performed using the Roche cobas Liat SARS-CoV-2 & Influenza A/B assay. This is a multiplex real-time RT-PCR assay for the detection of SARS-CoV-2, influenza A, and influenza B virus RNA. Viral nucleic acids may persist in vivo, independent of viability. Detection of viral nucleic acid does not imply the presence of infectious virus, or that virus nucleic acid is the cause of clinical symptoms. Negative results do not preclude SARS-CoV-2, influenza A, and/or influenza B infection and should not be used as the sole basis for diagnosis, treatment or other patient management decisions. Invalid results may be due to inhibiting substances in the specimen and recollection should occur.     Culture, Blood, Aerobic And Anaerobic [[id number]] Collected: 03/21/23 0506    Order Status: Completed Specimen: Blood, Venous Updated: 03/26/23 0900     Culture Blood No growth at 5 days              Imaging, reviewed and are significant for:  XR Chest AP Portable    Result Date: 03/28/2023   No acute cardiopulmonary disease. Reena May'Heureux, MD 03/28/2023 10:21 AM    MRI Abdomen W WO Contrast MRCP    Result Date: 03/24/2023   Exam quality degraded by artifact arising from motion. The following observations are made within these confines. 1.Pancreatic head 1.2 cm  enhancing lesion, suspected neoplasm. This lesion appears confined of the pancreas and demonstrates no major vascular interfaces. Recommend EUS for further evaluation. 2.No findings suspicious for metastatic disease within the imaged abdomen. 3.Mid right renal 1.7 cm cyst, recently demonstrating intermediate density by CT and is benign. 4.Hepatic steatosis. 5.Colonic diverticulosis. Elwood Miyamoto, MD 03/24/2023 8:49 AM    Recent Labs   Lab 03/30/23  0430 03/28/23  1318 03/27/23  0350   Sodium 133*  --  138   Potassium 4.3  --  4.2   Chloride 96*  --  103   CO2 28  --  25   BUN 12  --  9   Creatinine 0.7  --  0.6   Calcium  9.7  --  9.6   Albumin 3.6 3.9  3.8   Protein, Total 7.4 7.1 7.1   Bilirubin, Total 0.4 0.5 0.5   Alkaline Phosphatase 136* 135* 148*   ALT 47 68* 96*   AST (SGOT) 26 28 55*   Glucose 126*  --  109*        Signed by: Adine VEAR Fort, MD

## 2023-03-30 NOTE — Plan of Care (Addendum)
 NURSE NOTE SUMMARY  [place] [facility] [place] [facility]   Patient Name: [facility] D   Attending Physician: Nitta, Bradley H, MD   Today's date:   03/30/2023 LOS: 8 days   Shift Summary:                                                                Vitals:   VSS  Telemetry: no  Continuous pulse oximetry: no    Neurovascular: A&O x4, anxious    Respiratory:   1L NC when awake  CPAP when asleep  Scheduled 2x/day inhaler    GI:  Diet: Regular   Nausea/Vomiting: yes, Scope patch behind R ear, PRN Zofran  given  Passing Gas: yes  LBM: 3/2    GU: Voids freely    Integumentary:      03/29/23 2055   Integumentary   Integumentary (WDL) X   Skin Assessment Abrasion;Bruising;Scars;Tattoos;Edema (non pitting)   Abrasion Skin Location BL knees   Bruising Skin Location Scattered   Scar Location Scattered   Tattoos Location Scattered   Edema (non pitting) Location Generalized       PV: Generalized non-pitting edema      Pain: Patient reports pain, but states PRN PO medication does not work for her. Declined PRN Tylenol  and Oxycodone . MD gave OK to give IV morphine . Fioricet  given for 10/10 headache.       Ambulating: Yes, independent    Provider Notifications:   MD was notified of chest pain and shakiness since 3/1. Patient reports falling asleep and waking up confused and in a panic. MD ordered Maalox plus and suggested giving melatonin.     MD contacted about 10/10 pain and patient declining PO medication because they do not help. MD gave OK to give IV morphine .    Rapid Response Notifications:  Mobility:     N/A PMP Activity: Step 6 - Walks in Room (03/29/2023  7:18 PM)     Weight tracking:  Family Dynamic:   Last 3 Weights for the past 72 hrs (Last 3 readings):   Weight   03/29/23 0618 79.6 kg (175 lb 8 oz)   03/28/23 0700 84.6 kg (186 lb 8.2 oz)             Recent Vitals Last Bowel Movement   BP: 111/75 (03/30/2023  4:26 AM)  Heart Rate: (!) 101 (03/30/2023  4:26 AM)  Temp: 99 F (37.2 C) (03/30/2023  4:26  AM)  Resp Rate: 17 (03/30/2023  4:26 AM)  Weight: 79.6 kg (175 lb 8 oz) (03/29/2023  6:18 AM)  SpO2: 91 % (03/30/2023  4:26 AM)   Last BM Date: 03/29/23          Problem: Compromised Activity/Mobility  Goal: Activity/Mobility Interventions  Outcome: Progressing  Flowsheets (Taken 03/28/2023 2040 by Sydna Pagan, RN)  Activity/Mobility Interventions: Pad bony prominences, TAP Seated positioning system when OOB, Promote PMP, Reposition q 2 hrs / turn clock, Offload heels     Problem: Safety  Goal: Patient will be free from injury during hospitalization  03/30/2023 0108 by Lavonia Kast, RN  Outcome: Progressing  Flowsheets (Taken 03/30/2023 0107)  Patient will be free from injury during hospitalization:   Assess patient's risk for falls and implement fall prevention plan of  care per policy   Provide and maintain safe environment   Use appropriate transfer methods   Assess for patients risk for elopement and implement Elopement Risk Plan per policy   Include patient/ family/ care giver in decisions related to safety  03/30/2023 0107 by Lavonia Kast, RN  Outcome: Progressing  Flowsheets (Taken 03/30/2023 0107)  Patient will be free from injury during hospitalization:   Assess patient's risk for falls and implement fall prevention plan of care per policy   Provide and maintain safe environment   Use appropriate transfer methods   Assess for patients risk for elopement and implement Elopement Risk Plan per policy   Include patient/ family/ care giver in decisions related to safety     Problem: Psychosocial and Spiritual Needs  Goal: Demonstrates ability to cope with hospitalization/illness  03/30/2023 0108 by Lavonia Kast, RN  Outcome: Progressing  Flowsheets (Taken 03/30/2023 0107)  Demonstrates ability to cope with hospitalizations/illness:   Encourage verbalization of feelings/concerns/expectations   Provide quiet environment   Assist patient to identify own strengths and abilities   Encourage patient to set small goals  for self  03/30/2023 0107 by Lavonia Kast, RN  Outcome: Progressing  Flowsheets (Taken 03/30/2023 0107)  Demonstrates ability to cope with hospitalizations/illness:   Encourage verbalization of feelings/concerns/expectations   Provide quiet environment   Assist patient to identify own strengths and abilities   Encourage patient to set small goals for self

## 2023-03-30 NOTE — Progress Notes (Signed)
 Quick Doc  [place] [facility]   Patient Name: [facility] D   Attending Physician: Nitta, Bradley H, MD   Today's date:   03/30/2023 LOS: 8 days   Expected Discharge Date      Quick  Assessment:                                                              ReAdmit Risk Score: 14.42    CM Comments: 2/28: esophageal candidiasis & pancreatic mass, working on symptom mgmt.  DISPO: return home w/family & friends- f/u outpatient for pathology - MCD pending    Discussed in MDR this morning.  Patient to discharge once tolerating PO medications and pain management.                                                                                   Provider Notifications:

## 2023-03-30 NOTE — SLP Progress Note (Signed)
 [facility]   Speech Language Pathology  Treatment Note    Patient: Valerie May    MRN#: [id number]  Room: T671/T671.98    Treatment Type: Dysphagia     Recommendations/Plan:   Recommendations:  Solids:  Regular Solids (RG7)  Liquids: Thin Liquids (IDDSI TN0) via, any modality  Meds: PO, whole, crushed, with liquid, with puree (IDDSI level 4)     Precautions:   Precautions/Compensations: alert and awake, upright positioning, small single bites/sips  Supervision: Independent    Plan:   Referrals: none  SLP Frequency Recommended: 2-3x/wk  Discharge recommendations: No follow up required    Assessment:   Patient seen for swallow f/u at bedside. Pt s/p EGD and EUS which revealed esophageal candidiasis, mild gastritis, and pancreatic head mass. Pt c/o continued stomach pain and nausea. Today, pt tolerated sips of thin water  with adequate oral phase and no overt s/s aspiration. Recommend continue regular solids and thin liquids. No concern for oropharyngeal dysphagia. Defer to GI. No further skilled SLP services indicated at this time. Please re-consult with any changes in swallow function or overt s/s aspiration.     Subjective:   Patient is agreeable to participation in the therapy session. Nursing clears patient for therapy. Patient is alert and agreeable to session. No family present at bedside.   PAIN: yes, Pain rating: did not rate; Location: stomach, Intervention: RN previously aware    Behavior/cognition:  awake, alert, calm, pleasant, and cooperative    Objective:   Patient Status:    - Current diet: Regular solids (RG7) and Thin liquids (TN0)   - Position: in bed and upright    - Medical equipment in place: IV   - Respiratory Status:  2 L O2 via NC    - Interpreter services: n/a   - Precautions:  none     - Food allergies: none    Dysphagia:   Oral Inspection:   Lips: moist/pink  Tongue: moist/pink  Saliva: WFL  Teeth: WFL  Oral care provided: no  Comments: n/a    PO Trials Presented:   Thin (TN0)  via cup  Further trials deferred 2/2 stomach pains/nausea     Oral Phase:  WFL     Pharyngeal Phase/Airway Protection:  present hyolaryngeal movement   1-3 swallows per bolus  no overt signs or symptoms of aspiration     Exercises: n/a    Patient Education: verbal, pt voiced understanding     Patient left with call bell within reach, all needs met, and all questions answered. RN notified of session outcome and patient response.    Goals:   -Pt will consume least restrictive diet without overt clinical concern for aspiration across 24-28 hours. MET  -Pt will participate in ongoing assessment to determine patient need for instrumental evaluation of swallowing. D/c      Ivalene Platte, M.S., CCC-SLP   Available via EpicChat    PPE Worn by Provider: gloves and face mask     Time of Treatment:  SLP Received On: 03/30/23  Start Time: 1200  Stop Time: 1215  Time Calculation (min): 15 min

## 2023-03-30 NOTE — Plan of Care (Signed)
 Nursing Progress Note - Shift Summary and Care Plan    Shift Summary:   Larazapam prescribed for anxiety, fluocinonide  prescribed for rash/abrasions on bilateral knees  Vitals:    03/30/23 1536   BP: 113/77   Pulse: 93   Resp: 18   Temp: 97.5 F (36.4 C)   SpO2: 95%   On 1 L NC    Critical Events:   N/A    LDAs & Indication:  Patient Lines/Drains/Airways Status       Active Lines, Drains and Airways       Name Placement date Placement time Site Days    Peripheral IV 03/26/23 20 G Standard Anterior;Distal;Left Forearm 03/26/23  0550  Forearm  4                     Systems Overview:  Neuro:   A&Ox4   Resp:   On NC 1 L  Cardiac:   WDL  GI/GU:   Last BM Date: 03/29/23   1.5 mL/kg/hr (24hr)   Nutrition:   Tolerating regular diet.   +N/-V? Zofran  given  Blood sugar checks- No need for insulin   Activity/Safety/Mobility:   Patient is a standby assist  Skin/Wounds:    Abrasions/rash on bilateral knees (cream prescribed and applied)      Treatments/Wound Care  VTE Prophylaxis:   Medication VTE Prophylaxis Orders: enoxaparin  (LOVENOX ) syringe 40 mg  Mechanical VTE Prophylaxis Orders: Maintain sequential compression device     Upcoming Tests/Procedures  N/A    Discharge Plan:  Mar 25, 2023   Wean off IV pain medication use  Planning to discharge home      Care Plan    Problem: Pain interferes with ability to perform ADL  Goal: Pain at adequate level as identified by patient  Outcome: Progressing     Problem: Side Effects from Pain Analgesia  Goal: Patient will experience minimal side effects of analgesic therapy  Outcome: Progressing     Problem: Day of Admission - Heart Failure  Goal: Heart Failure Admission  Outcome: Progressing     Problem: Everyday - Heart Failure  Goal: Stable Vital Signs and Fluid Balance  Outcome: Progressing  Goal: Mobility/Activity is Maintained at Optimal Level for Patient  Outcome: Progressing  Goal: Nutritional Intake is Adequate  Outcome: Progressing  Goal: Teaching-Using CHF Warning Zones and  Educational Videos  Outcome: Progressing     Problem: Day of Discharge - Heart Failure  Goal: Discharge Education  Outcome: Progressing     Problem: Compromised Activity/Mobility  Goal: Activity/Mobility Interventions  Outcome: Progressing     Problem: Compromised Sensory Perception  Goal: Sensory Perception Interventions  Outcome: Progressing     Problem: Compromised Moisture  Goal: Moisture level Interventions  Outcome: Progressing     Problem: Compromised Nutrition  Goal: Nutrition Interventions  Outcome: Progressing     Problem: Compromised Friction/Shear  Goal: Friction and Shear Interventions  Outcome: Progressing     Problem: Moderate/High Fall Risk Score >5  Goal: Patient will remain free of falls  Outcome: Progressing     Problem: Safety  Goal: Patient will be free from injury during hospitalization  Outcome: Progressing  Goal: Patient will be free from infection during hospitalization  Outcome: Progressing     Problem: Pain  Goal: Pain at adequate level as identified by patient  Outcome: Progressing     Problem: Discharge Barriers  Goal: Patient will be discharged home or other facility with appropriate resources  Outcome: Progressing  Problem: Psychosocial and Spiritual Needs  Goal: Demonstrates ability to cope with hospitalization/illness  Outcome: Progressing

## 2023-03-31 LAB — COMPREHENSIVE METABOLIC PANEL
ALT: 85 U/L — ABNORMAL HIGH (ref <=55)
AST (SGOT): 54 U/L — ABNORMAL HIGH (ref <=41)
Albumin/Globulin Ratio: 1 (ref 0.9–2.2)
Albumin: 3.8 g/dL (ref 3.5–5.0)
Alkaline Phosphatase: 204 U/L — ABNORMAL HIGH (ref 37–117)
Anion Gap: 8 (ref 5.0–15.0)
BUN: 11 mg/dL (ref 7–21)
Bilirubin, Total: 0.3 mg/dL (ref 0.2–1.2)
CO2: 31 meq/L — ABNORMAL HIGH (ref 17–29)
Calcium: 9.5 mg/dL (ref 8.5–10.5)
Chloride: 97 meq/L — ABNORMAL LOW (ref 99–111)
Creatinine: 0.7 mg/dL (ref 0.4–1.0)
GFR: >60 mL/min/{1.73_m2} (ref >=60.0)
Globulin: 3.7 g/dL — ABNORMAL HIGH (ref 2.0–3.6)
Glucose: 120 mg/dL — ABNORMAL HIGH (ref 70–100)
Potassium: 4.2 meq/L (ref 3.5–5.3)
Protein, Total: 7.5 g/dL (ref 6.0–8.3)
Sodium: 136 meq/L (ref 135–145)

## 2023-03-31 LAB — WHOLE BLOOD GLUCOSE POCT
Whole Blood Glucose POCT: 118 mg/dL — ABNORMAL HIGH (ref 70–100)
Whole Blood Glucose POCT: 171 mg/dL — ABNORMAL HIGH (ref 70–100)
Whole Blood Glucose POCT: 132 mg/dL — ABNORMAL HIGH (ref 70–100)
Whole Blood Glucose POCT: 120 mg/dL — ABNORMAL HIGH (ref 70–100)

## 2023-03-31 MED ORDER — BUTALBITAL-APAP-CAFFEINE 50-325-40 MG PO TABS
1.0000 | ORAL_TABLET | ORAL | Status: DC | PRN
Start: 2023-03-31 — End: 2023-04-07
  Administered 2023-03-31 – 2023-04-06 (×4): 1 via ORAL
  Filled 2023-03-31 (×5): qty 1

## 2023-03-31 MED ORDER — HYDROMORPHONE HCL 1 MG/ML IJ SOLN
0.4000 mg | Freq: Once | INTRAMUSCULAR | Status: DC
Start: 2023-03-31 — End: 2023-03-31

## 2023-03-31 MED ORDER — MORPHINE SULFATE 2 MG/ML IJ/IV SOLN (WRAP)
2.0000 mg | Status: DC | PRN
Start: 2023-03-31 — End: 2023-04-01
  Administered 2023-03-31 – 2023-04-01 (×4): 2 mg via INTRAVENOUS
  Filled 2023-03-31 (×4): qty 1

## 2023-03-31 MED ORDER — HYDROMORPHONE HCL 4 MG PO TABS
4.0000 mg | ORAL_TABLET | Freq: Once | ORAL | Status: AC
Start: 2023-03-31 — End: 2023-03-31
  Administered 2023-03-31: 4 mg via ORAL
  Filled 2023-03-31: qty 1

## 2023-03-31 NOTE — Progress Notes (Addendum)
 Quick Doc  [place] [facility] [place] [facility]   Patient Name: Valerie May   Attending Physician: Erick Larve, DO   Today's date:   03/31/2023 LOS: 9 days   Expected Discharge Date      Quick  Assessment:                                                              ReAdmit Risk Score: 14.95    CM Comments: 3/3: esophageal candidiasis & pancreatic mass, working on symptom mgmt and PO intake.  DISPO: return home w/family & friends- f/u outpatient for pathology - MCD pending    Discussed in MDR this morning.  Patient will remain inpatient pending pathology report.      CM noted patient is Medicaid approved.                                                                             Provider Notifications:

## 2023-03-31 NOTE — Plan of Care (Signed)
 Nursing Progress Note - Shift Summary and Care Plan    Shift Summary:   Pt complained of pain 2x this shift. Oxycodone  PRN given with good effect temporarily, Pt then later complained of pain again, hospitalist notified and order 4mg  of Dilaudid  1x. With good effect.   Visit Vitals  BP 118/77   Pulse 93   Temp 98.1 F (36.7 C) (Oral)   Resp 19   Ht 1.499 m (4' 11)   Wt 80.2 kg (176 lb 12.8 oz)   LMP 03/18/2012   SpO2 (!) 87%   BMI 35.71 kg/m        Critical Events:   N/A    LDAs & Indication:  Patient Lines/Drains/Airways Status       Active Lines, Drains and Airways       Name Placement date Placement time Site Days    Peripheral IV 03/26/23 20 G Standard Anterior;Distal;Left Forearm 03/26/23  0550  Forearm  5                     Systems Overview:  Neuro:   A&Ox4  Resp:   1L NC  Cardiac:   No tele  GI/GU:   Last BM Date: 03/30/23   1 mL/kg/hr (24hr)   Nutrition:   Tolerating regulat diet.   - N/V  Blood sugar checks, No coverage this shift  Activity/Safety/Mobility:   Patient is independent    Skin/Wounds:    Scattered bruising, scars, tattoos  Abrasions/rash on BL knees  Treatments/Wound Care - at bed side for knees  VTE Prophylaxis:   N/A  Upcoming Tests/Procedures  N/A    Discharge Plan:  03/31/2023   Plan to wean pt off of IV meds to PO    Care Plan     Problem: Pain interferes with ability to perform ADL  Goal: Pain at adequate level as identified by patient  Outcome: Progressing     Problem: Side Effects from Pain Analgesia  Goal: Patient will experience minimal side effects of analgesic therapy  Outcome: Progressing     Problem: Day of Admission - Heart Failure  Goal: Heart Failure Admission  Outcome: Progressing     Problem: Everyday - Heart Failure  Goal: Stable Vital Signs and Fluid Balance  Outcome: Progressing  Goal: Mobility/Activity is Maintained at Optimal Level for Patient  Outcome: Progressing  Goal: Nutritional Intake is Adequate  Outcome: Progressing  Goal: Teaching-Using CHF Warning Zones and  Educational Videos  Outcome: Progressing     Problem: Day of Discharge - Heart Failure  Goal: Discharge Education  Outcome: Progressing     Problem: Compromised Activity/Mobility  Goal: Activity/Mobility Interventions  Outcome: Progressing     Problem: Compromised Sensory Perception  Goal: Sensory Perception Interventions  Outcome: Progressing     Problem: Compromised Moisture  Goal: Moisture level Interventions  Outcome: Progressing     Problem: Compromised Nutrition  Goal: Nutrition Interventions  Outcome: Progressing     Problem: Compromised Friction/Shear  Goal: Friction and Shear Interventions  Outcome: Progressing     Problem: Moderate/High Fall Risk Score >5  Goal: Patient will remain free of falls  Outcome: Progressing     Problem: Safety  Goal: Patient will be free from injury during hospitalization  Outcome: Progressing  Goal: Patient will be free from infection during hospitalization  Outcome: Progressing     Problem: Pain  Goal: Pain at adequate level as identified by patient  Outcome: Progressing     Problem: Discharge Barriers  Goal: Patient will be discharged home or other facility with appropriate resources  Outcome: Progressing     Problem: Psychosocial and Spiritual Needs  Goal: Demonstrates ability to cope with hospitalization/illness  Outcome: Progressing

## 2023-03-31 NOTE — Progress Notes (Signed)
 MEDICINE PROGRESS NOTE    Date Time: 03/31/23 10:14 AM  Patient Name: [facility]  Attending Physician: Erick Larve, DO    Hospital Course/Interim Summary:   Valerie May is a 53 y.o. female with a history of asthma, eosinophilia, mild bronchiectasis, recurrent pneumonia/bronchitis, migraine headaches, prolonged admit in [place]  for COVID-19 in 2021, sinus disease, GERD, DM2 who presented to [facility] 03/21/2023 with intractable nausea and vomiting, dyspnea, chest abdominal pain, acute on chronic pain in right arm, headache.  Early course complicated by acute respiratory acidosis felt to be due to IV opiates.  An MRI showed concern for pancreatic cancer.  She underwent EGD/EUS 2/26. Biopsy of pancreatic lesion is pending. Esophageal brushing confirmed esophageal candidiasis and she has been on a course of fluconazole . She continues to have persistent migratory pains and nausea and we are attempting to wean IV medications so that she can discharge.    Subjective     CC: Generalized abdominal pain    Interval History/24 hour events/HPI/Subjective:   Continues to have multifocal pain, abdominal pain, vulva area - says morphine  works but dissipates after 3 hrs. Feels nauseas, compazine allergy      Assessment:     Pancreatic lesion, concern for neoplasia  Intractable nausea, vomiting  Generalized diffuse pain, headache, abdominal pain, chest pain, unclear etiology  Esophageal candidiasis   Acute metabolic encephalopathy, resolved  Transaminitis, presumably DILI related to fluconazole , though resolved and fluconazole  continued so unclear etiology  Acute hypercapnic and hypoxic respiratory failure  Asthma with bronchiectasis  History of migraines  Mild transaminitis, resolved  Noninfectious SIRS  GERD  NIDDM  Right renal lesion  MDD/GAD  Class II obesity based on BMI calculation    Plan:   EGD/EUS 2/26 demonstrated likely esophageal candidiasis - confirmed on brushing.  Continue fluconazole  400 mg daily for 14 to  21-day course  Monitor for improvement in symptoms with treatment of esophageal candidiasis  ?ID consult    Negative HIV and hepatitis screen    Follow-up pathology from EUS biopsy/FNA of pancreatic lesion - Pathology might need more sample    Analgesia and antiemetics as needed - discussed trial to take PO PRNs in anticipation of discharge soon  Make morphine  dosing more frequent  Will likely need palliative to see her for pain management    Fioricet  for HA    Outpatient follow-up for right renal lesion  Bowel regimen    Continue venlafaxine  for major depression, short course benzo for acute anxiety until SNRI kicks in    Safety Checklist:   DVT Prophylaxis: Lovenox   Nutrition: Regular diet  Code Status: Full code    Review of Systems:     Negative except per HPI    Physical Exam:     VITAL SIGNS PHYSICAL EXAM   Temp:  [97.5 F (36.4 C)-98.4 F (36.9 C)] 97.5 F (36.4 C)  Heart Rate:  [73-100] 73  Resp Rate:  [18-22] 18  BP: (110-132)/(62-83) 110/66        Intake/Output Summary (Last 24 hours) at 03/31/2023 1014  Last data filed at 03/30/2023 2150  Gross per 24 hour   Intake 1100 ml   Output 1350 ml   Net -250 ml    Physical Exam  General: awake, alert, oriented x 3; no acute distress.  HEENT: perrla, eomi, sclera anicteric oropharynx clear without lesions, mucous membranes moist, edematous upper lip  Neck: supple, no LAD, no thyromegaly, no JVD, no carotid bruits  Cardiovascular: regular rate and rhythm, no  murmurs, rubs, or gallops  Lungs: clear to auscultation bilaterally, without wheezing, rhonchi, or rales  Abdomen: soft, non-tender, non-distended; no palpable masses, no hepatosplenomegaly, normoactive bowel sounds, no rebound or guarding  Extremities: no clubbing, cyanosis, or edema  Neuro: cranial nerves grossly intact, strength 5/5 in upper and lower extremities, sensation intact  Skin: no rashes or lesions noted         Meds:     Medications were reviewed:    Labs:     Labs (last 72 hours):             Recent Labs   Lab 03/25/23  0318   PT 11.0   INR 1.0       Microbiology, reviewed and are significant for:  Microbiology Results (last 15 days)       Procedure Component Value Units Date/Time    Respiratory Pathogen Panel With COVID-19, PCR [[id number]]     Order Status: Sent Specimen: Nasopharyngeal Swab from Nasopharynx     COVID-19 and Influenza ([place]) (symptomatic) [[id number]]  (Normal) Collected: 03/21/23 0532    Order Status: Completed Specimen: Swab from Anterior Nares Updated: 03/21/23 0646     SARS-CoV-2 (COVID-19) RNA Not Detected     Influenza A RNA Not Detected     Influenza B RNA Not Detected    Narrative:      A result of Detected indicates POSITIVE for the presence of viral RNA  A result of Not Detected indicates NEGATIVE for the presence of viral RNA    Test performed using the Roche cobas Liat SARS-CoV-2 & Influenza A/B assay. This is a multiplex real-time RT-PCR assay for the detection of SARS-CoV-2, influenza A, and influenza B virus RNA. Viral nucleic acids may persist in vivo, independent of viability. Detection of viral nucleic acid does not imply the presence of infectious virus, or that virus nucleic acid is the cause of clinical symptoms. Negative results do not preclude SARS-CoV-2, influenza A, and/or influenza B infection and should not be used as the sole basis for diagnosis, treatment or other patient management decisions. Invalid results may be due to inhibiting substances in the specimen and recollection should occur.     Culture, Blood, Aerobic And Anaerobic [[id number]] Collected: 03/21/23 0506    Order Status: Completed Specimen: Blood, Venous Updated: 03/26/23 0900     Culture Blood No growth at 5 days              Imaging, reviewed and are significant for:  XR Chest AP Portable    Result Date: 03/28/2023   No acute cardiopulmonary disease. Sharon D'Heureux, MD 03/28/2023 10:21 AM    Recent Labs   Lab 03/31/23  0359 03/30/23  0430   Sodium 136 133*   Potassium 4.2 4.3    Chloride 97* 96*   CO2 31* 28   BUN 11 12   Creatinine 0.7 0.7   Calcium  9.5 9.7   Albumin 3.8 3.6   Protein, Total 7.5 7.4   Bilirubin, Total 0.3 0.4   Alkaline Phosphatase 204* 136*   ALT 85* 47   AST (SGOT) 54* 26   Glucose 120* 126*        Signed by: Reese Netter, DO

## 2023-03-31 NOTE — Plan of Care (Addendum)
 Nursing Progress Note - Shift Summary and Care Plan    Shift Summary:  PRN IV Morphine  2mg  administered in AM for pain in abdomen  Fiorecet PRN administered for headache  Zofran  PRN IV administered   One dose of Oxycodone  PRN administered for pain  New order for sputum samples to be collected to r/o viral infection     Visit Vitals  BP 130/84   Pulse 98   Temp 97.5 F (36.4 C) (Oral)   Resp 18   Ht 1.499 m (4' 11)   Wt 80.4 kg (177 lb 4 oz)   LMP 03/18/2012   SpO2 91%   BMI 35.80 kg/m       LDAs & Indication:  Patient Lines/Drains/Airways Status       Active Lines, Drains and Airways       Name Placement date Placement time Site Days    Peripheral IV 03/26/23 20 G Standard Anterior;Distal;Left Forearm 03/26/23  0550  Forearm  5                     Systems Overview:  Neuro:   A&Ox4  Resp:   On room air, no signs of respiratory distress  Cardiac:   No telemetry monitoring, patient denies chest pain  GI/GU:   Last BM Date: 03/30/23   0.6 mL/kg/hr (24hr)   Nutrition:   Tolerating regular diet.     Activity/Safety/Mobility:   Patient ambulates independently  Skin/Wounds:    Scattered tattoos, bruising, abrasion to b/l knees       Upcoming Tests/Procedures  Pending sputum collection  PCR for COVID collection needed        Care Plan    Problem: Pain interferes with ability to perform ADL  Goal: Pain at adequate level as identified by patient  Outcome: Progressing  Flowsheets (Taken 03/31/2023 1055)  Pain at adequate level as identified by patient:   Identify patient comfort function goal   Assess for risk of opioid induced respiratory depression, including snoring/sleep apnea. Alert healthcare team of risk factors identified.   Assess pain on admission, during daily assessment and/or before any as needed intervention(s)   Reassess pain within 30-60 minutes of any procedure/intervention, per Pain Assessment, Intervention, Reassessment (AIR) Cycle   Evaluate if patient comfort function goal is met   Evaluate patient's  satisfaction with pain management progress   Offer non-pharmacological pain management interventions     Problem: Compromised Nutrition  Goal: Nutrition Interventions  Outcome: Progressing  Flowsheets (Taken 03/31/2023 1055)  Nutrition Interventions: Discuss nutrition at Rounds, I&Os, Document % meal eaten, Daily weights     Problem: Moderate/High Fall Risk Score >5  Goal: Patient will remain free of falls  Outcome: Progressing  Flowsheets (Taken 03/31/2023 1055)  Moderate Risk (6-13): LOW-Fall Interventions Appropriate for Low Fall Risk

## 2023-04-01 DIAGNOSIS — Z515 Encounter for palliative care: Secondary | ICD-10-CM

## 2023-04-01 DIAGNOSIS — K5900 Constipation, unspecified: Secondary | ICD-10-CM

## 2023-04-01 DIAGNOSIS — R52 Pain, unspecified: Secondary | ICD-10-CM

## 2023-04-01 LAB — COMPREHENSIVE METABOLIC PANEL
ALT: 104 U/L — ABNORMAL HIGH (ref <=55)
AST (SGOT): 64 U/L — ABNORMAL HIGH (ref <=41)
Albumin/Globulin Ratio: 1 (ref 0.9–2.2)
Albumin: 3.6 g/dL (ref 3.5–5.0)
Alkaline Phosphatase: 199 U/L — ABNORMAL HIGH (ref 37–117)
Anion Gap: 7 (ref 5.0–15.0)
BUN: 10 mg/dL (ref 7–21)
Bilirubin, Total: 0.3 mg/dL (ref 0.2–1.2)
CO2: 30 meq/L — ABNORMAL HIGH (ref 17–29)
Calcium: 9.6 mg/dL (ref 8.5–10.5)
Chloride: 98 meq/L — ABNORMAL LOW (ref 99–111)
Creatinine: 0.7 mg/dL (ref 0.4–1.0)
GFR: >60 mL/min/{1.73_m2} (ref >=60.0)
Globulin: 3.6 g/dL (ref 2.0–3.6)
Glucose: 97 mg/dL (ref 70–100)
Potassium: 4.3 meq/L (ref 3.5–5.3)
Protein, Total: 7.2 g/dL (ref 6.0–8.3)
Sodium: 135 meq/L (ref 135–145)

## 2023-04-01 LAB — LAB USE ONLY - CBC WITH DIFFERENTIAL
Absolute Basophils: 0.08 10*3/uL (ref 0.00–0.08)
Absolute Eosinophils: 0.59 10*3/uL — ABNORMAL HIGH (ref 0.00–0.44)
Absolute Immature Granulocytes: 0.09 10*3/uL — ABNORMAL HIGH (ref 0.00–0.07)
Absolute Lymphocytes: 2.85 10*3/uL (ref 0.42–3.22)
Absolute Monocytes: 0.79 10*3/uL (ref 0.21–0.85)
Absolute Neutrophils: 3.91 10*3/uL (ref 1.10–6.33)
Absolute nRBC: 0 10*3/uL (ref <=0.00)
Basophils %: 1 %
Eosinophils %: 7.1 %
Hematocrit: 44.7 % — ABNORMAL HIGH (ref 34.7–43.7)
Hemoglobin: 14.2 g/dL (ref 11.4–14.8)
Immature Granulocytes %: 1.1 %
Lymphocytes %: 34.3 %
MCH: 29.8 pg (ref 25.1–33.5)
MCHC: 31.8 g/dL (ref 31.5–35.8)
MCV: 93.7 fL (ref 78.0–96.0)
MPV: 10 fL (ref 8.9–12.5)
Monocytes %: 9.5 %
Neutrophils %: 47 %
Platelet Count: 384 10*3/uL — ABNORMAL HIGH (ref 142–346)
Preliminary Absolute Neutrophil Count: 3.91 10*3/uL (ref 1.10–6.33)
RBC: 4.77 10*6/uL (ref 3.90–5.10)
RDW: 12 % (ref 11–15)
WBC: 8.31 10*3/uL (ref 3.10–9.50)
nRBC %: 0 /100{WBCs} (ref <=0.0)

## 2023-04-01 LAB — FINE NEEDLE ASPIRATE

## 2023-04-01 LAB — WHOLE BLOOD GLUCOSE POCT
Whole Blood Glucose POCT: 135 mg/dL — ABNORMAL HIGH (ref 70–100)
Whole Blood Glucose POCT: 145 mg/dL — ABNORMAL HIGH (ref 70–100)
Whole Blood Glucose POCT: 134 mg/dL — ABNORMAL HIGH (ref 70–100)
Whole Blood Glucose POCT: 128 mg/dL — ABNORMAL HIGH (ref 70–100)

## 2023-04-01 LAB — TISSUE TRANSGLUTAMINASE ANTIBODIES, IGG/IGA
Tissue Transglutaminase Antibody, IgA: <1 U/mL (ref <15.0)
Tissue Transglutaminase Antibody, IgG: <1 U/mL (ref <15.0)

## 2023-04-01 MED ORDER — SENNOSIDES-DOCUSATE SODIUM 8.6-50 MG PO TABS
2.0000 | ORAL_TABLET | Freq: Every evening | ORAL | Status: DC
Start: 2023-04-01 — End: 2023-04-06
  Administered 2023-04-01 – 2023-04-05 (×4): 2 via ORAL
  Filled 2023-04-01 (×4): qty 2

## 2023-04-01 MED ORDER — BISACODYL 5 MG PO TBEC
10.0000 mg | DELAYED_RELEASE_TABLET | Freq: Every day | ORAL | Status: AC | PRN
Start: 2023-04-01 — End: ?

## 2023-04-01 MED ORDER — HYDROMORPHONE HCL 1 MG/ML IJ SOLN
0.4000 mg | INTRAMUSCULAR | Status: DC | PRN
Start: 2023-04-01 — End: 2023-04-02
  Administered 2023-04-02 (×2): 0.4 mg via INTRAVENOUS
  Filled 2023-04-01 (×2): qty 1

## 2023-04-01 MED ORDER — OXYCODONE HCL 10 MG PO TABS
10.0000 mg | ORAL_TABLET | Freq: Four times a day (QID) | ORAL | Status: DC
Start: 2023-04-01 — End: 2023-04-02
  Administered 2023-04-01 – 2023-04-02 (×3): 10 mg via ORAL
  Filled 2023-04-01 (×4): qty 1

## 2023-04-01 MED ORDER — OXYCODONE HCL 5 MG PO TABS
5.0000 mg | ORAL_TABLET | ORAL | Status: DC | PRN
Start: 2023-04-01 — End: 2023-04-02
  Filled 2023-04-01 (×2): qty 1

## 2023-04-01 NOTE — Plan of Care (Signed)
 Nursing Progress Note - Shift Summary and Care Plan    Shift Summary:   Pt complained of pain, given PRN IV morphine  x2 with good effect. Pt later complained of Nasuea, given PO Zofran . Pt used Bipap at night.   Visit Vitals  BP 100/68   Pulse 90   Temp 97.5 F (36.4 C) (Oral)   Resp 17   Ht 1.499 m (4' 11)   Wt 80.4 kg (177 lb 4 oz)   LMP 03/18/2012   SpO2 94%   BMI 35.80 kg/m        Critical Events:   N/A    LDAs & Indication:  Patient Lines/Drains/Airways Status       Active Lines, Drains and Airways       Name Placement date Placement time Site Days    Peripheral IV 03/26/23 20 G Standard Anterior;Distal;Left Forearm 03/26/23  0550  Forearm  5                     Systems Overview:  Neuro:   A&Ox4  Resp:   On 1L, complains of SOB when off NC, which happens recurrently. Pt takes off NC.  Cardiac:   N/A  GI/GU:   LBM:03/30/33   0.6 mL/kg/hr (24hr)   Nutrition:   Tolerating Regular diet.   + N/V, Zofran  given  No insulin  coverage  Activity/Safety/Mobility:   Patient is Independent  Moderate fall risk  Skin/Wounds:    Scattered bruising, tattoos, Abrasion to BL knees, Cream in bin to be applied per order.    VTE Prophylaxis:     Upcoming Tests/Procedures  Biopsy pending    Discharge Plan:  N/A  Pending palliative care consult    Care Plan     Problem: Pain interferes with ability to perform ADL  Goal: Pain at adequate level as identified by patient  Outcome: Progressing     Problem: Side Effects from Pain Analgesia  Goal: Patient will experience minimal side effects of analgesic therapy  Outcome: Progressing     Problem: Day of Admission - Heart Failure  Goal: Heart Failure Admission  Outcome: Progressing     Problem: Everyday - Heart Failure  Goal: Stable Vital Signs and Fluid Balance  Outcome: Progressing  Goal: Mobility/Activity is Maintained at Optimal Level for Patient  Outcome: Progressing  Goal: Nutritional Intake is Adequate  Outcome: Progressing  Goal: Teaching-Using CHF Warning Zones and Educational  Videos  Outcome: Progressing     Problem: Day of Discharge - Heart Failure  Goal: Discharge Education  Outcome: Progressing     Problem: Compromised Activity/Mobility  Goal: Activity/Mobility Interventions  Outcome: Progressing     Problem: Compromised Sensory Perception  Goal: Sensory Perception Interventions  Outcome: Progressing     Problem: Compromised Moisture  Goal: Moisture level Interventions  Outcome: Progressing     Problem: Compromised Nutrition  Goal: Nutrition Interventions  Outcome: Progressing     Problem: Compromised Friction/Shear  Goal: Friction and Shear Interventions  Outcome: Progressing     Problem: Moderate/High Fall Risk Score >5  Goal: Patient will remain free of falls  Outcome: Progressing     Problem: Safety  Goal: Patient will be free from injury during hospitalization  Outcome: Progressing  Goal: Patient will be free from infection during hospitalization  Outcome: Progressing     Problem: Pain  Goal: Pain at adequate level as identified by patient  Outcome: Progressing     Problem: Discharge Barriers  Goal: Patient will be discharged home  or other facility with appropriate resources  Outcome: Progressing     Problem: Psychosocial and Spiritual Needs  Goal: Demonstrates ability to cope with hospitalization/illness  Outcome: Progressing

## 2023-04-01 NOTE — Nursing Progress Note (Signed)
 Nursing progress note:   -Palliative following for severe back and abd pain  Neuro: A&Ox4,   Vitals: Vital signs stable on room air  Integumentary: Scattered bruising, scars and tattoos.   Respiratory: Lung sounds clear, patient denies SOB  Cardio: No telemetry, patient denies chest pain  GU: Voiding freely  GI: Regular diet, -N/V; +flatus, LBM 03/05  IV: 20G LFA, Flushed, SL   Pain control: Pt complained of severe back pain, PRN Oxycodone /Morphine  given for pain per order. Pt complained of nausea after meals, PRN Zosyn  given for nausea per order. Patient educated on pain/nausea management. MD aware  Mobility: Independent  Safety: Safety precautions in place, call bell within reach       Problem: Pain interferes with ability to perform ADL  Goal: Pain at adequate level as identified by patient  04/01/2023 1718 by Delitha Elms, RN  Outcome: Progressing  04/01/2023 1119 by Arnaldo Heffron, RN  Outcome: Progressing     Problem: Side Effects from Pain Analgesia  Goal: Patient will experience minimal side effects of analgesic therapy  04/01/2023 1718 by Vinicius Brockman, RN  Outcome: Progressing  04/01/2023 1119 by Mervil Wacker, RN  Outcome: Progressing     Problem: Day of Admission - Heart Failure  Goal: Heart Failure Admission  04/01/2023 1718 by Almedia Cordell, RN  Outcome: Progressing  04/01/2023 1119 by Lema Heinkel, RN  Outcome: Progressing     Problem: Everyday - Heart Failure  Goal: Stable Vital Signs and Fluid Balance  04/01/2023 1718 by Anara Cowman, RN  Outcome: Progressing  04/01/2023 1119 by Bejamin Hackbart, RN  Outcome: Progressing  Goal: Mobility/Activity is Maintained at Optimal Level for Patient  04/01/2023 1718 by Doretha Goding, RN  Outcome: Progressing  04/01/2023 1119 by Dorothea Yow, RN  Outcome: Progressing  Goal: Nutritional Intake is Adequate  04/01/2023 1718 by Bluford Sedler, RN  Outcome: Progressing  04/01/2023 1119 by Caelan Atchley, RN  Outcome: Progressing  Goal: Teaching-Using CHF Warning Zones and  Educational Videos  04/01/2023 1718 by Nakeeta Sebastiani, RN  Outcome: Progressing  04/01/2023 1119 by Rivers Hamrick, RN  Outcome: Progressing     Problem: Day of Discharge - Heart Failure  Goal: Discharge Education  04/01/2023 1718 by Jakeya Gherardi, RN  Outcome: Progressing  04/01/2023 1119 by Long Brimage, RN  Outcome: Progressing     Problem: Compromised Activity/Mobility  Goal: Activity/Mobility Interventions  04/01/2023 1718 by Tarry Fountain, RN  Outcome: Progressing  04/01/2023 1119 by Tiesha Marich, RN  Outcome: Progressing     Problem: Compromised Sensory Perception  Goal: Sensory Perception Interventions  04/01/2023 1718 by Cana Mignano, RN  Outcome: Progressing  04/01/2023 1119 by Giavanni Zeitlin, RN  Outcome: Progressing     Problem: Compromised Moisture  Goal: Moisture level Interventions  04/01/2023 1718 by Arnoldo Hildreth, RN  Outcome: Progressing  04/01/2023 1119 by Hartleigh Edmonston, RN  Outcome: Progressing     Problem: Compromised Nutrition  Goal: Nutrition Interventions  04/01/2023 1718 by Jabre Heo, RN  Outcome: Progressing  04/01/2023 1119 by Lowell Mcgurk, RN  Outcome: Progressing     Problem: Compromised Friction/Shear  Goal: Friction and Shear Interventions  04/01/2023 1718 by Jersi Mcmaster, RN  Outcome: Progressing  04/01/2023 1119 by Ikeya Brockel, RN  Outcome: Progressing     Problem: Moderate/High Fall Risk Score >5  Goal: Patient will remain free of falls  04/01/2023 1718 by Wen Merced, RN  Outcome: Progressing  04/01/2023 1119 by Abhijot Straughter, RN  Outcome: Progressing     Problem: Safety  Goal: Patient will be free from injury during hospitalization  04/01/2023 1718 by Brigida Scotti, RN  Outcome: Progressing  04/01/2023 1119 by Jnai Snellgrove, RN  Outcome: Progressing  Goal: Patient will be free from infection during hospitalization  04/01/2023 1718 by Samaria Anes, RN  Outcome: Progressing  04/01/2023 1119 by Kurstin Dimarzo, RN  Outcome: Progressing     Problem: Pain  Goal: Pain at adequate level as  identified by patient  04/01/2023 1718 by Dailyn Kempner, RN  Outcome: Progressing  04/01/2023 1119 by Trumaine Wimer, RN  Outcome: Progressing     Problem: Discharge Barriers  Goal: Patient will be discharged home or other facility with appropriate resources  04/01/2023 1718 by Kairen Hallinan, RN  Outcome: Progressing  04/01/2023 1119 by Roben Schliep, RN  Outcome: Progressing     Problem: Psychosocial and Spiritual Needs  Goal: Demonstrates ability to cope with hospitalization/illness  04/01/2023 1718 by Conleigh Heinlein, RN  Outcome: Progressing  04/01/2023 1119 by Ethal Gotay, RN  Outcome: Progressing

## 2023-04-01 NOTE — Progress Notes (Signed)
 Palliative Medicine & Comprehensive Care  Service Phone Number: FX: [phone], Mon-Fri 9a-4p / [facility] Pager: [phone] (24/7)    Pastoral Care - Consult/Progress Note     Proposed Spiritual Plan of Care: Offer emotional and spiritual support.     Narrative:   Palliative care chaplain met with patient for spiritual assessment/support. Patient was sitting on the couch with all the lights off when chaplain arrived. Chaplain offered to turn on lights and patient agreed. Patient was tearful and acknowledged that she has prayed that when se dies it would be with suffering and peaceful. Patient tearfully explained that the most challenging aspect of dying is leaving her four children especially the one son who has bipolar disease. Patient is concerned with the fact that there is no one to care for him is her absence. Patient acknowledged that she does not follow in particular faith tradition but believes in God. Chaplain provided prayer for healing recovery per patient's request.     Assessment:    Spiritual Assessment: Finding strength in faith, Coping with illness/hospitalization, Grief concerns, Anxious/fearful emotions, Overwhelmed, and Patient needs spiritual/emotional support    Spiritual Strengths: Faith in God/higher power    Spiritual Distress: Expresses sadness/despair and Grieving    Background:    Visit Type: Initial    Source: Other    Present at Visit: Patient    Spiritual Care Provided to: Patient only    Length of Visit: 0-15 minutes    Summary:     Reason for Request: Emotional support, End of life issues, Spiritual support, and Spiritual assessment    Spiritual Care Interventions: Provided emotional support, Provided reflective and compassionate listening, Provided silent and supportive presence, Provided comfort, encouragement, affirmation, Provided prayer, and Made a positive connection with patient    Spiritual Care Outcomes: Patient expressed appreciation of visit    Follow up: Follow-up important

## 2023-04-01 NOTE — Progress Notes (Signed)
 MEDICINE PROGRESS NOTE    Date Time: 04/01/23 9:52 AM  Patient Name: Valerie May  Attending Physician: Erick Larve, DO    Hospital Course/Interim Summary:   Valerie May is a 53 y.o. female with a history of asthma, eosinophilia, mild bronchiectasis, recurrent pneumonia/bronchitis, migraine headaches, prolonged admit in [place]  for COVID-19 in 2021, sinus disease, GERD, DM2 who presented to [facility] 03/21/2023 with intractable nausea and vomiting, dyspnea, chest abdominal pain, acute on chronic pain in right arm, headache.  Early course complicated by acute respiratory acidosis felt to be due to IV opiates.  An MRI showed concern for pancreatic cancer.  She underwent EGD/EUS 2/26. Biopsy of pancreatic lesion is pending. Esophageal brushing confirmed esophageal candidiasis and she has been on a course of fluconazole . She continues to have persistent migratory pains and nausea and we are attempting to wean IV medications so that she can discharge.    Assessment:     Pancreatic lesion, concern for neoplasia  Intractable nausea, vomiting  Generalized diffuse pain, headache, abdominal pain, chest pain  Esophageal candidiasis   Acute metabolic encephalopathy, resolved  Transaminitis, presumably DILI related to fluconazole , though resolved and fluconazole  continued so unclear etiology  Acute hypercapnic and hypoxic respiratory failure  Asthma with bronchiectasis  History of migraines  Mild transaminitis, resolved  Noninfectious SIRS  GERD  NIDDM  Right renal lesion  MDD/GAD  Class II obesity based on BMI calculation    Subjective     CC: Generalized abdominal pain    Interval History/24 hour events/HPI/Subjective:     Patient feeling down today - anxious about possible diagnosis. Says she feels drowsy, pain meds helping     Plan:   EGD/EUS 2/26 demonstrated likely esophageal candidiasis - confirmed on brushing.  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 Unfortunately, all the cellularity is in the smears made on-site and there is nothing abnormal in the cell block for staining to further characterize tumor type.  Repeat cellblock from remnant supernate is also acellular.    This case is peer-reviewed intradepartmentally, with concurrence.    Analgesia and antiemetics as needed - discussed trial to take PO PRNs in anticipation of discharge soon  Make morphine  dosing more frequent  Palliative c/s for pain management - appreciate input -pt worried about her son with BPD  Oxycodone  10mg  q6h, Oxy 5mg  PRN, dilaudid  0.4mg  q4h PRN    Fioricet  for HA    Outpatient follow-up for right renal lesion  Bowel regimen    Continue venlafaxine  for major depression, short course benzo for acute anxiety until SNRI kicks in    Safety Checklist:   DVT Prophylaxis: Lovenox   Nutrition: Regular diet  Code Status: Full code    Review of Systems:     Negative except per HPI    Physical Exam:     VITAL SIGNS PHYSICAL EXAM   Temp:  [97.5 F (36.4 C)-99.1 F (37.3 C)] 99.1 F (37.3 C)  Heart Rate:  [90-98] 96  Resp Rate:  [17-19] 17  BP: (97-130)/(64-84) 108/74  Intake/Output Summary (Last 24 hours) at 04/01/2023 9047  Last data filed at 03/31/2023 2313  Gross per 24 hour   Intake 1102 ml   Output 1260 ml   Net -158 ml    Physical Exam  General: awake, alert, oriented x 3;anxious  HEENT: perrla, eomi, sclera anicteric oropharynx clear without lesions, mucous membranes moist, upper lip now normal  Neck: supple, no LAD, no thyromegaly, no JVD, no carotid  bruits  Cardiovascular: regular rate and rhythm, no murmurs, rubs, or gallops  Lungs: clear to auscultation bilaterally, without wheezing, rhonchi, or rales  Abdomen: soft, mildly tender, non-distended; no palpable masses, no hepatosplenomegaly, normoactive bowel sounds, no rebound or guarding  Extremities: no clubbing, cyanosis, or edema  Neuro: cranial nerves grossly intact, strength 5/5 in upper and lower extremities, sensation intact  Skin: no rashes or lesions noted         Meds:     Medications were reviewed:    Labs:     Labs (last 72 hours):    Recent Labs   Lab 04/01/23  0332   WBC 8.31   Hemoglobin 14.2   Hematocrit 44.7*   Platelet Count 384*                 Microbiology, reviewed and are significant for:  Microbiology Results (last 15 days)       Procedure Component Value Units Date/Time    Culture, Sputum and Lower Respiratory [[id number]]     Order Status: Sent Specimen: Sputum, Expectorated     Respiratory Pathogen Panel With COVID-19, PCR [[id number]]     Order Status: Sent Specimen: Nasopharyngeal Swab from Nasopharynx     Respiratory Pathogen Panel With COVID-19, PCR [[id number]]     Order Status: Sent Specimen: Nasopharyngeal Swab from Nasopharynx     COVID-19 and Influenza ([place]) (symptomatic) [[id number]]  (Normal) Collected: 03/21/23 0532    Order Status: Completed Specimen: Swab from Anterior Nares Updated: 03/21/23 0646     SARS-CoV-2 (COVID-19) RNA Not Detected     Influenza A RNA Not Detected     Influenza B RNA Not Detected    Narrative:      A result of Detected indicates POSITIVE for the presence of viral RNA  A result of Not Detected indicates NEGATIVE for the presence of viral RNA    Test performed using the Roche cobas [place] SARS-CoV-2 & Influenza A/B assay. This is a multiplex real-time RT-PCR assay for the detection of SARS-CoV-2, influenza A, and influenza B virus RNA. Viral nucleic acids may persist in vivo, independent of viability. Detection of viral nucleic acid does not  imply the presence of infectious virus, or that virus nucleic acid is the cause of clinical symptoms. Negative results do not preclude SARS-CoV-2, influenza A, and/or influenza B infection and should not be used as the sole basis for diagnosis, treatment or other patient management decisions. Invalid results may be due to inhibiting substances in the specimen and recollection should occur.     Culture, Blood, Aerobic And Anaerobic [[id number]] Collected: 03/21/23 0506    Order Status: Completed Specimen: Blood, Venous Updated: 03/26/23 0900     Culture Blood No growth at 5 days              Imaging, reviewed and are significant for:  XR Chest AP Portable    Result Date: 03/28/2023   No acute cardiopulmonary disease. Reena May'Heureux, MD 03/28/2023 10:21 AM    Recent Labs   Lab 04/01/23  9667 03/31/23  0359   Sodium 135 136   Potassium 4.3 4.2   Chloride 98* 97*   CO2 30* 31*   BUN 10 11   Creatinine 0.7 0.7   Calcium  9.6 9.5   Albumin 3.6 3.8   Protein, Total 7.2 7.5   Bilirubin, Total 0.3 0.3   Alkaline Phosphatase 199* 204*   ALT 104* 85*   AST (SGOT) 64* 54*   Glucose 97 120*        Signed by: Reese Netter, DO

## 2023-04-01 NOTE — Consults (Signed)
 [facility] Palliative Medicine & Comprehensive Care     Palliative Care Consult   Date Time: 04/01/23 11:33 AM   Patient Name: Valerie May, Valerie May   Location: T671/T671.98   Attending Physician: Erick Larve, DO   Primary Care Physician: Michiel Olam RAMAN, FNP   Consulting Provider: Elveria Duncans, NP   Consulting Service: Palliative Medicine and Comprehensive Care  Consult request from Erick Larve, DO to see patient regarding:   Reason for Referral: Pain     Palliative Diagnosis Category: Cancer  Patient Type: New       Assessment & Plan   Impression   Valerie May 53 y.o. female with PMHx sthma, eosinophilia, mild bronchiectasis, recurrent pneumonia/bronchitis, migraine headaches, prolonged admit in [place]  for COVID-19 in 2021, sinus disease, GERD, DM2. 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 S/p EGD/EUS 2/26. FNA w/ neoplasm.Palliative Care consult for pain.          Recommendations   1. Goals of Care:   Introduced Palliative Care services to pt  Palliative Care is available to participate in family meetings, please page/epic chat/ call to arrange.   Recommend oncology consult  Palliative care consulted for pain/symptoms, see below.     ACP Validation: Not addressed  Surrogate Decision Maker: Next of Kin status: children:   ACP Document: None    CODE STATUS: Full Code     2. Malignancy Related Pain:   - Central malignancy related nociceptive abdominal pain- constant, worse with eating  RUE/RLE pain chronic, injury related- not managed by palliative care  - Trial non Pharm interventions as indicated (I.e. hot/cold packs; repositioning; activity; spiritual care; relaxation techniques)  Tylenol  PRN mild pain  Schedule Oxycodone  10 mg PO Q 6 hour ATC  Oxycodone  5mg  PO Q 4 hour PRN moderate pain OR Dilaudid  0.4mg  IV Q 4 hour PRN severe pain  These medication requires intensive monitoring for drug toxicity and will be monitored for ADE e.g. over somnolence, constipation, respiratory  depression.       3. Non-Pain Symptoms:   Constipation:   Change to pericolace 2 tab PO qhs  Change bisacodyl  to prn  Cont Miralax     Nausea/Vomiting:  Hx jitteryness with compazine  Cont scop patch   Cont reglan  QID  Zofran  4mg  IV/PO Q 6 hour PRN nausea    Depression/Anxiety:  Continue home regimen (venlafaxine )   Continue Ativan  0.25mg  PO Q 6 hour PRN anxiety  Incorporate non-pharmacological treatment of anxiety when appropriate including emotional support, distraction strategies, and relaxation exercises.        4. Psychosocial: Palliative Care Therapist is following  Pt is single, 4 adult children  Her youngest son lives with her and has needs related to bipolar disorder dx      5. Spiritual: Catholic. Palliative Care Chaplain is following    6. PC Team follow-up plans: tomorrow      Discharge Disposition: TBD      Outpatient Follow Up Recommended: Yes    Outcomes: Improved pain intervention and Improved non-pain symptom therapy    Coordination of care with Patient, referring provider, and Bedside Nurse with recommendations above.        Elveria Duncans, MSN, AG-ACNP-BC, ANP-BC, [facility]  Palliative Medicine and Comprehensive Care  Spectra : [phone] M-F,  8a-4:30p  Extend Pager: [phone],    8a-4:30p  Team Phone Number:  [phone], M-F 8a-4:30P  Team Xtend Pager: [phone], 8a-4:30p, Daily     History of Presenting Illness   Valerie May is a  53 y.o. female admitted to hospital on 03/21/2023 with Generalized abdominal pain [R10.84]     PDMP review: reviewed    MAR review:52 OME    Note: OMEs calculated using updated opioid conversion table (McPherson ML. Demystifying Opioid Conversion Calculations, A Guide for Effective Dosing, 2nd Edition. ASHP; 2018.)    Pt awake in bed. I introduced Palliative Medicine services here at [facility]. I described how our service provides symptom management and support for those living with serious illness.  Provided overview of the interdisciplinary team for Palliative Medicine that  consist of nurse practitioners, clinical therapist, physicians, and chaplain.      Pt feels like she doesn't have much information given to her regarding her medical problems. She was able to discuss her candida and her biopsy and that more information was needed for diagnosis and she's unsure if it is cancer. We discussed that path/imaging appears concerning for neoplasm and that additional data may be required by oncology to determine type. We spoke about preparing for difficult news regarding your health and sources of strength and support. Pt feels like her mood has been low and she worries about her son that lives with her and has needs related to bipolar disorder. She is agreeable to chaplaincy and therapist support.       LBM today. + chronic RUE/LE pain. Central abdominal pain that comes/go in intensity and worsens with eating. Opioids help but often takes them when the pain is high so she feels like pills don't help. + Nausea, meds have been helping. Able to eat.       Goals of Care   CURRENT CPR Status: Full Code         [place]  Health Care Decisions Act: Procedure in absence of an advance directive ([phone])  1. A guardian for the patient. This subdivision shall not be construed to require such appointment in order that a health care decision can be made under this section; or  2. The patient's spouse except where a divorce action has been filed and the divorce is not final; or  3. An adult child of the patient; or  4. A parent of the patient; or  5. An adult brother or sister of the patient; or  6. Any other relative of the patient in the descending order of blood relationship; or  7. Except in cases in which the proposed treatment recommendation involves the withholding or withdrawing of a life-prolonging procedure, any adult, except any interior and spatial designer, employee, or agent of a health care provider currently involved in the care of the patient, who (i) has exhibited special care and concern for the patient  and (ii) is familiar with the patient's religious beliefs and basic values and any preferences previously expressed by the patient regarding health care, to the extent that they are known.  Source: https://law.[place] .gov/vacode/title54.1/chapter29/section54.01-2984/         Palliative Functional and Symptom Assessment         Palliative Performance Scale: 70% - Reduced ambulation, unable to do normal work, some evidence of disease, full self-care, normal or reduced intake, full LOC        [place] Symptom Assessment Scale (ESAS): Completed       Review of Systems     [x]  As per HPI and physical exam, otherwise all systems negative            Past Medical, Surgical and Family History   Medical History[1]   Past Surgical History[2]   Family History[3]  Social History   Substance Use:    reports that she has never smoked. She has been exposed to tobacco smoke. She has never used smokeless tobacco.    reports that she does not currently use alcohol .    reports no history of drug use.     Marital Status: single        Cultural Concerns:    Translator needed: [x]  NO   []  YES                                       Language: English  ID#_________    Spirituality and Importance: Catholic      Medications   Scheduled Meds  Current Facility-Administered Medications   Medication Dose Route Frequency   . aluminum & magnesium  / diphenhydrAMINE  / lidocaine   15 mL Oral QID   . aspirin  EC  81 mg Oral Daily   . bisacodyl   10 mg Oral Daily   . budesonide -formoterol   2 puff Inhalation BID   . cetirizine   10 mg Oral Daily   . enoxaparin   40 mg Subcutaneous Daily   . fluconazole   400 mg Oral Q24H SCH   . fluocinonide    Topical BID   . insulin  lispro  1-4 Units Subcutaneous QHS   . insulin  lispro  1-8 Units Subcutaneous TID AC   . metoclopramide   5 mg Oral QID   . montelukast   10 mg Oral QHS   . pantoprazole   40 mg Oral Q12H SCH   . polycarbophil  625 mg Oral BID   . polyethylene glycol  17 g Oral Daily   . scopolamine   1 patch  Transdermal Q72H   . senna-docusate  1 tablet Oral BID   . venlafaxine   25 mg Oral TID MEALS      DRIPS     PRN MEDS  PRN Medications[4]    Allergies   Allergies[5]    Physical Exam   BP 117/77   Pulse 95   Temp 98.2 F (36.8 C) (Oral)   Resp 18   Ht 1.499 m (4' 11)   Wt 80.1 kg (176 lb 9.4 oz)   LMP 03/18/2012   SpO2 96%   BMI 35.67 kg/m    Physical Exam:  General: woman in bed in  no acute distress   HEENT:  EOMI, sclera anicteric, OP/OC clear  Neck: supple, FROM   CV: regular rate and rhythm  Lungs:unlabored, no accessory muscle use  Abd: soft, NT, ND, +bs  Ext: no clubbing, cyanosis  Neuro: awake, alert, oriented x 3, no focal deficits  Psych:  appropriate insight and judgement, mood and affect  Skin: no rashes or lesions noted      Labs / Radiology   Lab and diagnostics: reviewed in Epic  Recent Labs   Lab 04/01/23  0332   WBC 8.31   Hemoglobin 14.2   Hematocrit 44.7*   Platelet Count 384*              Recent Labs   Lab 04/01/23  0332   Sodium 135   Potassium 4.3   Chloride 98*   CO2 30*   BUN 10   Creatinine 0.7   GFR >60.0   Glucose 97   Calcium  9.6     Recent Labs   Lab 04/01/23  0332 03/30/23  0430 03/28/23  1318   Bilirubin, Total 0.3  More results  in Results Review 0.5   Bilirubin Direct  --   --  0.1   Protein, Total 7.2  More results in Results Review 7.1   Albumin 3.6  More results in Results Review 3.9   ALT 104*  More results in Results Review 68*   AST (SGOT) 64*  More results in Results Review 28   More results in Results Review = values in this interval not displayed.          XR Chest AP Portable    Result Date: 03/28/2023   No acute cardiopulmonary disease. Reena D'Heureux, MD 03/28/2023 10:21 AM                    [1]  Past Medical History:  Diagnosis Date   . COVID-19    . COVID-19 2021   . Heart valve problem    . Kidney stone    . Pneumonia    [2]  Past Surgical History:  Procedure Laterality Date   . CYST REMOVAL     . ESOPHAGOGASTRODUODENOSCOPY (EGD), ESOPHAGEAL ULTRASOUND (EUS)  WITH FINE NEEDLE ASPIRATION/BIOPSY OF ESOPHAGUS, STOMACH, OR DUODENUM N/A 03/25/2023    Procedure: ESOPHAGOGASTRODUODENOSCOPY (EGD), ESOPHAGEAL ULTRASOUND (EUS) WITH FINE NEEDLE ASPIRATION/BIOPSY OF ESOPHAGUS, STOMACH, OR DUODENUM;  Surgeon: Abagail Carole HERO, MD;  Location: [id number];  Service: Gastroenterology;  Laterality: N/A;   . GALLBLADDER SURGERY     . HERNIA REPAIR     . HYSTERECTOMY     . TUBAL LIGATION     [3]  Family History  Problem Relation Name Age of Onset   . Heart attack Mother     . Diabetes Father     . Heart attack Maternal Grandfather     . Heart attack Paternal Grandfather     . Stroke Son     . Heart attack Maternal Uncle     . Stomach cancer Cousin     . Bone cancer Maternal Aunt     . Colon cancer Neg Hx     . Breast cancer Neg Hx     [4]  Current Facility-Administered Medications   Medication Dose   . acetaminophen   650 mg   . albuterol   2.5 mg   . alum & mag hydroxide-simethicone   30 mL   . bengay greaseless     . benzocaine -menthol   1 lozenge   . benzonatate   200 mg   . butalbital -acetaminophen -caffeine   1 tablet   . carboxymethylcellulose sodium  1 drop   . dextrose   15 g of glucose    Or   . dextrose   12.5 g    Or   . dextrose   12.5 g    Or   . glucagon  (rDNA)  1 mg   . lidocaine  viscous  5 mL   . LORazepam   0.25 mg   . Lubrifresh PM     . magnesium  sulfate  1 g   . melatonin  3 mg   . morphine   2 mg   . naloxone   0.2 mg   . ondansetron   8 mg    Or   . ondansetron   4 mg   . oxyCODONE   10 mg   . potassium & sodium phosphates   2 packet   . potassium chloride   0-60 mEq    Or   . potassium chloride   0-60 mEq    Or   . potassium chloride   10 mEq   . saline  2 spray   [5]  Allergies  Allergen Reactions   . Benadryl  [Diphenhydramine ]    . Dust Mite Extract    . Methocarbamol Itching   . Molds & Smuts    . Other    . Compazine [Prochlorperazine] Anxiety

## 2023-04-02 LAB — RESTRICTED TEST**RESPIRATORY PATHOGEN PANEL WITH COVID-19, PCR
Adenovirus DNA: NOT DETECTED
Bordetella parapertussis DNA: NOT DETECTED
Bordetella pertussis DNA: NOT DETECTED
Chlamydophila pneumoniae DNA: NOT DETECTED
Coronavirus 229E RNA: NOT DETECTED
Coronavirus HKU1 RNA: NOT DETECTED
Coronavirus NL63 RNA: NOT DETECTED
Coronavirus OC43 RNA: NOT DETECTED
Human Metapneumovirus RNA: NOT DETECTED
Human Rhinovirus/Enterovirus RNA: NOT DETECTED
Influenza A RNA: NOT DETECTED
Influenza B RNA: NOT DETECTED
Mycoplasma pneumoniae DNA: NOT DETECTED
Parainfluenza Virus 1 RNA: NOT DETECTED
Parainfluenza Virus 2 RNA: NOT DETECTED
Parainfluenza Virus 3 RNA: NOT DETECTED
Parainfluenza Virus 4 RNA: NOT DETECTED
Respiratory Syncytial Virus RNA: NOT DETECTED
SARS-CoV-2 (COVID-19) RNA: NOT DETECTED

## 2023-04-02 LAB — WHOLE BLOOD GLUCOSE POCT
Whole Blood Glucose POCT: 121 mg/dL — ABNORMAL HIGH (ref 70–100)
Whole Blood Glucose POCT: 110 mg/dL — ABNORMAL HIGH (ref 70–100)
Whole Blood Glucose POCT: 133 mg/dL — ABNORMAL HIGH (ref 70–100)
Whole Blood Glucose POCT: 165 mg/dL — ABNORMAL HIGH (ref 70–100)

## 2023-04-02 LAB — BASIC METABOLIC PANEL
Anion Gap: 10 (ref 5.0–15.0)
BUN: 8 mg/dL (ref 7–21)
CO2: 27 meq/L (ref 17–29)
Calcium: 9.4 mg/dL (ref 8.5–10.5)
Chloride: 98 meq/L — ABNORMAL LOW (ref 99–111)
Creatinine: 0.7 mg/dL (ref 0.4–1.0)
GFR: >60 mL/min/{1.73_m2} (ref >=60.0)
Glucose: 119 mg/dL — ABNORMAL HIGH (ref 70–100)
Potassium: 4 meq/L (ref 3.5–5.3)
Sodium: 135 meq/L (ref 135–145)

## 2023-04-02 MED ORDER — SUMATRIPTAN SUCCINATE 6 MG/0.5 ML SC SOLN (WRAP)
6.0000 mg | Freq: Once | SUBCUTANEOUS | Status: AC
Start: 2023-04-02 — End: 2023-04-02
  Administered 2023-04-02: 6 mg via SUBCUTANEOUS
  Filled 2023-04-02: qty 0.5

## 2023-04-02 MED ORDER — HYDROMORPHONE HCL 2 MG PO TABS
2.0000 mg | ORAL_TABLET | ORAL | Status: DC | PRN
Start: 2023-04-02 — End: 2023-04-03

## 2023-04-02 MED ORDER — HYDROMORPHONE HCL 2 MG PO TABS
2.0000 mg | ORAL_TABLET | Freq: Four times a day (QID) | ORAL | Status: DC
Start: 2023-04-02 — End: 2023-04-03
  Administered 2023-04-02 – 2023-04-03 (×3): 2 mg via ORAL
  Filled 2023-04-02 (×3): qty 1

## 2023-04-02 MED ORDER — HYDROMORPHONE HCL 1 MG/ML IJ SOLN
0.6000 mg | Freq: Once | INTRAMUSCULAR | Status: AC
Start: 2023-04-02 — End: 2023-04-02
  Administered 2023-04-02: 0.6 mg via INTRAVENOUS
  Filled 2023-04-02: qty 1

## 2023-04-02 MED ORDER — HYDROMORPHONE HCL 1 MG/ML IJ SOLN
0.6000 mg | INTRAMUSCULAR | Status: DC | PRN
Start: 2023-04-02 — End: 2023-04-03
  Administered 2023-04-02 – 2023-04-03 (×3): 0.6 mg via INTRAVENOUS
  Filled 2023-04-02 (×2): qty 1

## 2023-04-02 MED ORDER — HYDROMORPHONE HCL 1 MG/ML IJ SOLN
0.6000 mg | INTRAMUSCULAR | Status: DC | PRN
Start: 2023-04-02 — End: 2023-04-02
  Filled 2023-04-02: qty 1

## 2023-04-02 MED ORDER — MORPHINE SULFATE 2 MG/ML IJ/IV SOLN (WRAP)
2.0000 mg | Status: DC | PRN
Start: 2023-04-02 — End: 2023-04-02
  Filled 2023-04-02: qty 1

## 2023-04-02 NOTE — Progress Notes (Signed)
 MEDICINE PROGRESS NOTE    Date Time: 04/02/23 10:02 AM  Patient Name: Valerie May  Attending Physician: Erick Larve, DO    Hospital Course/Interim Summary:   Valerie May is a 53 y.o. female with a history of asthma, eosinophilia, mild bronchiectasis, recurrent pneumonia/bronchitis, migraine headaches, prolonged admit in [place]  for COVID-19 in 2021, sinus disease, GERD, DM2 who presented to [facility] ([place] 03/21/2023 with intractable nausea and vomiting, dyspnea, chest abdominal pain, acute on chronic pain in right arm, headache.  Early course complicated by acute respiratory acidosis felt to be due to IV opiates.  An MRI showed concern for pancreatic cancer.  She underwent EGD/EUS 2/26. Biopsy of pancreatic lesion is pending. Esophageal brushing confirmed esophageal candidiasis and she has been on a course of fluconazole . She continues to have persistent migratory pains and nausea and we are attempting to wean IV medications so that she can discharge.    Assessment:     Pancreatic lesion, concern for neoplasia  Intractable nausea, vomiting  Generalized diffuse pain, headache, abdominal pain, chest pain  Esophageal candidiasis   Acute metabolic encephalopathy, resolved  Transaminitis, presumably DILI related to fluconazole , though resolved and fluconazole  continued so unclear etiology  Acute hypercapnic and hypoxic respiratory failure  Asthma with bronchiectasis  History of migraines  Mild transaminitis, resolved  Noninfectious SIRS  GERD  NIDDM  Right renal lesion  MDD/GAD  Class II obesity based on BMI calculation    Subjective     CC: Generalized abdominal pain    Interval History/24 hour events/HPI/Subjective:     Patient with pain, HA, 10/10  Tearful during exam   Says morphine  works best     Plan:   EGD/EUS 2/26 demonstrated likely esophageal candidiasis - confirmed on brushing.  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 Unfortunately, all the cellularity is in the smears made on-site and there is nothing abnormal in the cell block for staining to further characterize tumor type.  Repeat cellblock from remnant supernate is also acellular.    This case is peer-reviewed intradepartmentally, with concurrence.    Analgesia and antiemetics as needed - discussed trial to take PO PRNs in anticipation of discharge soon  Make morphine  dosing more frequent  Palliative c/s for pain management - appreciate input -recommending PO over IV as patient is wanting only IV due to nausea, adjusting meds as below:  Smock oxycodone , pt refusing  Rotate morphine  in setting of refractory nausea and concern for opioid related nausea  Dilaudid  2mg  PO Q 6 hour ATC  Dilaudid  2mg  PO Q 3 hour PRN pain  Dilaudid  0.6mg  IV Q 4 hour PRN breakthrough pain.     Fioricet  for HA, triptan x1, if still with HA can try migraine cocktail and might need head CT  Moving all extremities fine     Outpatient follow-up for right renal lesion  Bowel regimen    Continue venlafaxine  for major depression, short course benzo for acute anxiety until SNRI kicks  in    Safety Checklist:   DVT Prophylaxis: Lovenox   Nutrition: Regular diet  Code Status: Full code    Review of Systems:     Negative except per HPI    Physical Exam:     VITAL SIGNS PHYSICAL EXAM   Temp:  [97.9 F (36.6 C)-98.2 F (36.8 C)] 98 F (36.7 C)  Heart Rate:  [88-95] 90  Resp Rate:  [16-18] 16  BP: (106-125)/(70-81) 125/78        Intake/Output Summary  (Last 24 hours) at 04/02/2023 1002  Last data filed at 04/02/2023 0220  Gross per 24 hour   Intake 200 ml   Output 2100 ml   Net -1900 ml    Physical Exam  General: awake, alert, oriented x 3;anxious  HEENT: perrla, eomi, sclera anicteric oropharynx clear without lesions, mucous membranes moist, upper lip now normal  Neck: supple, no LAD, no thyromegaly, no JVD, no carotid bruits  Cardiovascular: regular rate and rhythm, no murmurs, rubs, or gallops  Lungs: clear to auscultation bilaterally, without wheezing, rhonchi, or rales  Abdomen: soft, mildly tender, non-distended; no palpable masses, no hepatosplenomegaly, normoactive bowel sounds, no rebound or guarding  Extremities: no clubbing, cyanosis, or edema  Neuro: cranial nerves grossly intact, strength 5/5 in upper and lower extremities, sensation intact  Skin: no rashes or lesions noted         Meds:     Medications were reviewed:    Labs:     Labs (last 72 hours):    Recent Labs   Lab 04/01/23  0332   WBC 8.31   Hemoglobin 14.2   Hematocrit 44.7*   Platelet Count 384*                 Microbiology, reviewed and are significant for:  Microbiology Results (last 15 days)       Procedure Component Value Units Date/Time    Culture, Sputum and Lower Respiratory [[id number]] Collected: 04/02/23 0847    Order Status: Sent Specimen: Sputum, Expectorated Updated: 04/02/23 0859    Respiratory Pathogen Panel With COVID-19, PCR [[id number]]     Order Status: Sent Specimen: Nasopharyngeal Swab from Nasopharynx     Respiratory Pathogen Panel With COVID-19, PCR [[id number]]     Order Status: Sent Specimen: Nasopharyngeal Swab from Nasopharynx     COVID-19 and Influenza ([place]) (symptomatic) [[id number]]  (Normal) Collected: 03/21/23 0532    Order Status: Completed Specimen: Swab from Anterior Nares Updated: 03/21/23 0646     SARS-CoV-2 (COVID-19) RNA Not Detected     Influenza A RNA Not Detected     Influenza B RNA Not Detected    Narrative:      A result of Detected indicates  POSITIVE for the presence of viral RNA  A result of Not Detected indicates NEGATIVE for the presence of viral RNA    Test performed using the Roche cobas [place] SARS-CoV-2 & Influenza A/B assay. This is a multiplex real-time RT-PCR assay for the detection of SARS-CoV-2, influenza A, and influenza B virus RNA. Viral nucleic acids may persist in vivo, independent of viability. Detection of viral nucleic acid does not imply the presence of infectious virus, or that virus nucleic acid is the cause of clinical symptoms. Negative results do not preclude SARS-CoV-2, influenza A, and/or influenza B infection and should not be used as the sole basis for diagnosis, treatment or other patient management decisions. Invalid results may be due to inhibiting substances in the specimen and recollection should occur.  Culture, Blood, Aerobic And Anaerobic [[id number]] Collected: 03/21/23 0506    Order Status: Completed Specimen: Blood, Venous Updated: 03/26/23 0900     Culture Blood No growth at 5 days              Imaging, reviewed and are significant for:  XR Chest AP Portable    Result Date: 03/28/2023   No acute cardiopulmonary disease. Sharon May'Heureux, MD 03/28/2023 10:21 AM    Recent Labs   Lab 04/02/23  0415 04/01/23  0332 03/31/23  0359   Sodium 135 135 136   Potassium 4.0 4.3 4.2   Chloride 98* 98* 97*   CO2 27 30* 31*   BUN 8 10 11    Creatinine 0.7 0.7 0.7   Calcium  9.4 9.6 9.5   Albumin  --  3.6 3.8   Protein, Total  --  7.2 7.5   Bilirubin, Total  --  0.3 0.3   Alkaline Phosphatase  --  199* 204*   ALT  --  104* 85*   AST (SGOT)  --  64* 54*   Glucose 119* 97 120*        Signed by: Reese Netter, DO

## 2023-04-02 NOTE — Plan of Care (Signed)
 [facility]    GI PLAN OF CARE    Date Time:    04/02/23    4:08 PM  Patient Name: Valerie May     GI is asked to repeat EUS w/ bx tomorrow d/t inadequate tissue from initial FNB on 2/26. Will plan for EUS with biopsy tomorrow, 04/03/23  NPO at [facility]  Hold chemical DVT ppx in AM  To ensure patient is optimized for procedure, please check CBC and BMP in AM    Preparation for endoscopy: (criteria for safe sedation)  - K > 3.2 and < 5.5  - Na > 126 and < 145  - HgB > 7, and > 8 for cardiac history  - Oxygen saturation of 95% on 3 liters of oxygen or less      Signed by: Sharyne SHAUNNA Akin, PA- C

## 2023-04-02 NOTE — Progress Notes (Signed)
 Palliative Medicine & Comprehensive Care  Service Phone Number: FX: [phone], Mon-Fri 9a-4p / [employer]end Pager: [phone] (24/7)    Clinical Therapist Note     Palliative Care Clinical Therapist attempted to meet with Ms. Guillot to introduce self and role however, she was on the phone and requested that Clinical Therapist return at another time.        Seretha Rosenthal, MSW  Palliative Care Clinical Therapist I  Palliative Medicine & Comprehensive [facility]  [phone]  Spectra : [phone]

## 2023-04-02 NOTE — Plan of Care (Deleted)
 Nursing Progress Note - Shift Summary and Care Plan    Shift Summary:   Patient complained of headache, one time dose of Sumatriptan  given. PRN Ativan  x1 for anxiety. Refused scheduled oxycodone , prn dilaudid  x1. One episode of nausea, prn Zofran  x1.   Visit Vitals  BP 125/78   Pulse 90   Temp 98 F (36.7 C) (Oral)   Resp 16   Ht 1.499 m (4' 11)   Wt 80.4 kg (177 lb 4 oz)   LMP 03/18/2012   SpO2 96%   BMI 35.80 kg/m        Critical Events:   None    LDAs & Indication:  Patient Lines/Drains/Airways Status       Active Lines, Drains and Airways       Name Placement date Placement time Site Days    Peripheral IV 03/26/23 20 G Standard Anterior;Distal;Left Forearm 03/26/23  0550  Forearm  7                     Systems Overview:  Neuro:   A&Ox4   Resp:   On 2L NC  Cardiac:   No tele  GI/GU:   LBM 04/02/2023   Nutrition:   Tolerating Regular diet.   +N/V, prn Zofran    Blood sugar checks- ACHS  Activity/Safety/Mobility:   Patient is independent  Skin/Wounds:    Intact, bilateral rash on knees with cream applied, scattered tattoos and scars  Treatments/Wound Care  VTE Prophylaxis:   Medication VTE Prophylaxis Orders: enoxaparin  (LOVENOX ) syringe 40 mg  Mechanical VTE Prophylaxis Orders: Maintain sequential compression device     Upcoming Tests/Procedures  May need repeat EUS for pancreatic mass biopsy     Discharge Plan:  Apr 06, 2023   Wean off IV pain meds, see palliative care's note  Planning to discharge home with family/ friends     Care Plan     Problem: Pain interferes with ability to perform ADL  Goal: Pain at adequate level as identified by patient  Outcome: Progressing  Flowsheets (Taken 04/01/2023 2100 by Ferna Gerold, RN)  Pain at adequate level as identified by patient:   Identify patient comfort function goal   Assess for risk of opioid induced respiratory depression, including snoring/sleep apnea. Alert healthcare team of risk factors identified.   Assess pain on admission, during daily assessment and/or  before any as needed intervention(s)   Reassess pain within 30-60 minutes of any procedure/intervention, per Pain Assessment, Intervention, Reassessment (AIR) Cycle   Evaluate if patient comfort function goal is met   Evaluate patient's satisfaction with pain management progress   Offer non-pharmacological pain management interventions   Consult/collaborate with Pain Service   Consult/collaborate with Physical Therapy, Occupational Therapy, and/or Speech Therapy     Problem: Side Effects from Pain Analgesia  Goal: Patient will experience minimal side effects of analgesic therapy  Outcome: Progressing  Flowsheets (Taken 04/02/2023 1049)  Patient will experience minimal side effects of analgesic therapy:   Monitor/assess patient's respiratory status (RR depth, effort, breath sounds)   Assess for changes in cognitive function     Problem: Day of Admission - Heart Failure  Goal: Heart Failure Admission  Outcome: Progressing  Flowsheets (Taken 04/02/2023 1049)  Heart Failure Admission:   Vital signs and telemetry per policy   Strict Intake/Output     Problem: Everyday - Heart Failure  Goal: Stable Vital Signs and Fluid Balance  Outcome: Progressing  Flowsheets (Taken 04/02/2023 1049)  Stable Vital Signs and  Fluid Balance:   Daily Standing Weights in the morning using the same scale, after using the bathroom and before breadfast.  If unable to stand, zero the bed and use the bed scale   Monitor labs and report abnormalities to physician   Strict Intake/Output  Goal: Mobility/Activity is Maintained at Optimal Level for Patient  Outcome: Progressing  Flowsheets (Taken 03/27/2023 0354 by Erla Drone, RN)  Mobility/Activity is Maintained at Optimal Level for Patient:   Increase mobility as tolerated/progressive mobility protocol   Assess for changes in respiratory status, level of consciousness and/or development of fatigue  Goal: Nutritional Intake is Adequate  Outcome: Progressing  Flowsheets (Taken 03/26/2023 0301 by Erla Drone, RN)  Nutritional Intake is Adequate: Assess appetite,anorexia and amount of meal/food tolerated  Goal: Teaching-Using CHF Warning Zones and Educational Videos  Outcome: Progressing  Flowsheets (Taken 03/26/2023 0301 by Erla Drone, RN)  Teaching-Using CHF Warning Zones and Educational Videos:   Signs & Symptoms of CHF   Medications     Problem: Day of Discharge - Heart Failure  Goal: Discharge Education  Outcome: Progressing  Flowsheets (Taken 03/26/2023 0301 by Erla Drone, RN)  Day of Discharge:   After Visit Summary (Discharge Instuctions) with medications   CHF Warning Zones and when to call for help   Follow-up appointments   Daily Standing Weights   Low Sodium Diet     Problem: Compromised Activity/Mobility  Goal: Activity/Mobility Interventions  Outcome: Progressing  Flowsheets (Taken 04/02/2023 0827)  Activity/Mobility Interventions: Pad bony prominences, TAP Seated positioning system when OOB, Promote PMP, Reposition q 2 hrs / turn clock, Offload heels     Problem: Compromised Sensory Perception  Goal: Sensory Perception Interventions  Outcome: Progressing  Flowsheets (Taken 04/02/2023 0827)  Sensory Perception Interventions: Offload heels, Pad bony prominences, Reposition q 2hrs/turn Clock, Q2 hour skin assessment under devices if present     Problem: Compromised Moisture  Goal: Moisture level Interventions  Outcome: Progressing  Flowsheets (Taken 04/02/2023 0827)  Moisture level Interventions: Moisture wicking products, Moisture barrier cream     Problem: Compromised Nutrition  Goal: Nutrition Interventions  Outcome: Progressing  Flowsheets (Taken 04/02/2023 0827)  Nutrition Interventions: Discuss nutrition at Rounds, I&Os, Document % meal eaten, Daily weights     Problem: Compromised Friction/Shear  Goal: Friction and Shear Interventions  Outcome: Progressing  Flowsheets (Taken 04/02/2023 0827)  Friction and Shear Interventions: Pad bony prominences, Off load heels, HOB 30 degrees or less unless  contraindicated, Consider: TAP seated positioning, Heel foams     Problem: Pain  Goal: Pain at adequate level as identified by patient  Outcome: Progressing  Flowsheets (Taken 04/01/2023 2100 by Ferna Gerold, RN)  Pain at adequate level as identified by patient:   Identify patient comfort function goal   Assess for risk of opioid induced respiratory depression, including snoring/sleep apnea. Alert healthcare team of risk factors identified.   Assess pain on admission, during daily assessment and/or before any as needed intervention(s)   Reassess pain within 30-60 minutes of any procedure/intervention, per Pain Assessment, Intervention, Reassessment (AIR) Cycle   Evaluate if patient comfort function goal is met   Evaluate patient's satisfaction with pain management progress   Offer non-pharmacological pain management interventions   Consult/collaborate with Pain Service   Consult/collaborate with Physical Therapy, Occupational Therapy, and/or Speech Therapy

## 2023-04-02 NOTE — Progress Notes (Addendum)
 NUTRITION SCREEN  Reason for Assessment: LOS    Intervention/Plan:  Continue with regular diet, encouraging small frequent meals that are bland, room temp/cold, with fluids in between.  Will order Ensure Max and Gelatein 20 to try between meals.  Please offer when pt consumes <75% of meals in efforts to optimize oral intake.    Goals: To meet >60% of nutrition goal with PO at meals and oral supplements or nutrition support by next RD intervention.    Clinical / Nutrition Assessment: Valerie May is a(n) 53 y.o. 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  Oncology, GI, and palliative involved.    Chart reviewed.  Pt has been consuming 50-100% of meals during admission, per RN documentation.  Intermittent nausea after meals, and worsening abd pain with meals reported.  Course c/b esophageal candidiasis currently undergoing tx for.  Plan for NPO at [facility] for repeat EUS with bx tomorrow d/t inadequate tissue from initial FNB 2/26.    Orders Placed This Encounter   Procedures    Adult diet Regular    Diet NPO time specified       Patient Lines/Drains/Airways Status       Active PICC Line / CVC Line / PIV Line / Drain / Airway / Intraosseous Line / Epidural Line / ART Line / Line / Wound / Pressure Ulcer / NG/OG Tube       Name Placement date Placement time Site Days    Peripheral IV 03/26/23 20 G Standard Anterior;Distal;Left Forearm 03/26/23  0550  Forearm  7                    Pertinent Labs:glu 119, Cl 98    Current Facility-Administered Medications   Medication Dose Route Frequency    aluminum & magnesium  / diphenhydrAMINE  / lidocaine   15 mL  Oral QID    aspirin  EC  81 mg Oral Daily    budesonide -formoterol   2 puff Inhalation BID    cetirizine   10 mg Oral Daily    [Held by provider] enoxaparin   40 mg Subcutaneous Daily    fluconazole   400 mg Oral Q24H SCH    fluocinonide    Topical BID    HYDROmorphone   2 mg Oral Q6H    insulin  lispro  1-4 Units Subcutaneous QHS    insulin  lispro  1-8 Units Subcutaneous TID AC    metoclopramide   5 mg Oral QID    montelukast   10 mg Oral QHS    pantoprazole   40 mg Oral Q12H SCH    polycarbophil  625 mg Oral BID    polyethylene glycol  17 g Oral Daily    scopolamine   1 patch Transdermal Q72H    senna-docusate  2 tablet Oral QHS    venlafaxine   25 mg Oral TID MEALS         Anthropometrics:  Height: 149.9 cm (4' 11)  Weight: 80.4 kg (177 lb 4 oz)  Body mass index is 35.8 kg/m.  IBW:51.7 kg    %Weight Change: Questionable bedscale weight measurements bolded below given wt hx available.  Overall weight maintenance x 4 months.   Weight Monitoring     Weight Weight Method   12/09/2022  79.379 kg     03/21/2023 80.287 kg  Stated     80.287 kg     03/22/2023 80.287 kg  Stated     84 kg  Bed Scale    03/23/2023 84 kg  Bed Scale    03/28/2023 84.6 kg     03/29/2023 79.606 kg  Standing Scale    03/30/2023 80.196 kg  Standing Scale    03/31/2023 80.4 kg  Standing Scale     80.4 kg     04/01/2023 80.1 kg     04/02/2023 80.4 kg  Standing Scale      GI function: Last BM Date: 04/01/23.  N/V (last episode of emesis this morning) with abd tenderness.    Estimated Nutrition Needs:   1135-1300 kcals (22-25 kcal/kg IBW)  104 gm pro (2 gm/kg IBW)    Nutrition Diagnosis:   Nutrition assessment completed and at this time the patient does not meet the Academy of Nutrition and Dietetics & the American Society for Parenteral and Enteral Nutrition's criteria for diagnosis of adult malnutrition. The patient is however at high/increased nutrition risk based on oral intake average 75% > 1 week.    Monitor/Eval:  Monitoring Evaluation   PO/EN/PN Adequacy  PO/Access/Delivery   GI Profile GI fxn/tolerance   Weight/NFPE Trends/Skin/Edema   Labs CMP, mg, phos, LFTs, TG   Meds Food/med interactions     [name] M. Jaleen Grupp, RD, CNSC  Clinical Dietitian Specialist II  # [phone], # [phone] (main RD line)

## 2023-04-02 NOTE — Plan of Care (Signed)
 Nursing Progress Note - Shift Summary and Care Plan    Shift Summary:   Pt refused Respiratory Inhaler. Pt was feeling anxious and gave ativan . Pt complained of constant pain throughout shift, given scheduled pain meds. Pt complained of pain again and refused PO oxycodone . Given PRN IV dilaudid .  Visit Vitals  BP 121/73   Pulse 94   Temp 98.2 F (36.8 C) (Oral)   Resp 17   Ht 1.499 m (4' 11)   Wt 80.4 kg (177 lb 4 oz)   LMP 03/18/2012   SpO2 95%   BMI 35.80 kg/m        Critical Events:   No critical events this shift    LDAs & Indication:  Patient Lines/Drains/Airways Status       Active Lines, Drains and Airways       Name Placement date Placement time Site Days    Peripheral IV 03/26/23 20 G Standard Anterior;Distal;Left Forearm 03/26/23  0550  Forearm  6                     Systems Overview:  Neuro:   A&Ox4  Resp:   On 2L NC  Cardiac:   No tele  GI/GU:   Last BM Date: 04/01/23   0.9 mL/kg/hr (24hr)   Nutrition:   Tolerating Regular diet.   - N/V this shift  No Insulin  coverage this shift  Activity/Safety/Mobility:   Patient is independent  Low fall risk  Skin/Wounds:    BL Knee rash cream available.  Scattered scars and tattoos  Treatments/Wound Care    Upcoming Tests/Procedures  Pending Biopsy    Discharge Plan:  Apr 06, 2023   Wean of IV meds  Palliative care following    Care Plan     Problem: Pain interferes with ability to perform ADL  Goal: Pain at adequate level as identified by patient  Outcome: Progressing     Problem: Side Effects from Pain Analgesia  Goal: Patient will experience minimal side effects of analgesic therapy  Outcome: Progressing     Problem: Day of Admission - Heart Failure  Goal: Heart Failure Admission  Outcome: Progressing     Problem: Everyday - Heart Failure  Goal: Stable Vital Signs and Fluid Balance  Outcome: Progressing  Goal: Mobility/Activity is Maintained at Optimal Level for Patient  Outcome: Progressing  Goal: Nutritional Intake is Adequate  Outcome: Progressing  Goal:  Teaching-Using CHF Warning Zones and Educational Videos  Outcome: Progressing     Problem: Day of Discharge - Heart Failure  Goal: Discharge Education  Outcome: Progressing     Problem: Compromised Activity/Mobility  Goal: Activity/Mobility Interventions  Outcome: Progressing     Problem: Compromised Sensory Perception  Goal: Sensory Perception Interventions  Outcome: Progressing     Problem: Compromised Moisture  Goal: Moisture level Interventions  Outcome: Progressing     Problem: Compromised Nutrition  Goal: Nutrition Interventions  Outcome: Progressing     Problem: Compromised Friction/Shear  Goal: Friction and Shear Interventions  Outcome: Progressing     Problem: Moderate/High Fall Risk Score >5  Goal: Patient will remain free of falls  Outcome: Progressing     Problem: Safety  Goal: Patient will be free from injury during hospitalization  Outcome: Progressing  Goal: Patient will be free from infection during hospitalization  Outcome: Progressing     Problem: Pain  Goal: Pain at adequate level as identified by patient  Outcome: Progressing     Problem: Discharge Barriers  Goal: Patient will be discharged home or other facility with appropriate resources  Outcome: Progressing     Problem: Psychosocial and Spiritual Needs  Goal: Demonstrates ability to cope with hospitalization/illness  Outcome: Progressing

## 2023-04-02 NOTE — Progress Notes (Signed)
 [facility] Palliative Medicine & Comprehensive Care        Palliative Care Progress Note   Date Time: 04/02/23 1:07 PM   Patient Name: Valerie May, Valerie May   Location: T671/T671.98   Attending Physician: Erick Larve, DO   Primary Care Physician: Michiel Olam RAMAN, FNP   Consulting Provider: Elveria Duncans, NP   Consulting Service: Palliative Medicine and Comprehensive Care  Consult request from Erick Larve, DO to see patient regarding:   Reason for Referral: Pain          Assessment & Plan   Impression   Valerie May 53 y.o. female with PMHx asthma, eosinophilia, mild bronchiectasis, recurrent pneumonia/bronchitis, migraine headaches, prolonged admit in [place]  for COVID-19 in 2021, sinus disease, GERD, DM2. 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 S/p EGD/EUS 2/26. FNA w/ neoplasm.Palliative Care consult for pain.            Recommendations   1. Goals of Care:   Palliative Care is available to participate in family meetings, please page/epic chat/ call to arrange.   Recommend oncology consult  Palliative care consulted for pain/symptoms, see below.       ACP Validation: Not addressed  Surrogate Decision Maker: Next of Kin status: children:   ACP Document: None    CODE STATUS: Full Code     2.  Malignancy Related Pain:   - Central malignancy related nociceptive abdominal pain- constant, worse with eating  Migraines, Chronic MSK pain; RUE/RLE pain chronic, injury related- not managed by palliative care  - Trial non Pharm interventions as indicated (I.e. hot/cold packs; repositioning; activity; spiritual care; relaxation techniques)  Tylenol  PRN mild pain  Altmar oxycodone , pt refusing  Rotate morphine  in setting of refractory nausea and concern for opioid related nausea  Start Dilaudid  2mg  PO Q 6 hour ATC  Start Dilaudid  2mg  PO Q 3 hour PRN pain  Start Dilaudid  0.6mg  IV Q 4 hour PRN breakthrough pain.   Pt exhibits signs of total pain. Palliative Care chaplain, therapist are following. Ativan   PRN available as well.   Pt with various pain complaints and patterns of requesting only IV opioids as well as specific IV opioids. Explained at length today rationale for above regimen as well as recommendation to utilize oral opioids for longer duration of effect in comparison to IV. Attempting to transition to regimen that can be utilized on an outpatient basis.   These medication requires intensive monitoring for drug toxicity and will be monitored for ADE e.g. over somnolence, constipation, respiratory depression.         3. Non-Pain Symptoms:   Constipation:   Pericolace 2 tab PO qhs  Change bisacodyl  to prn  Cont Miralax      Nausea/Vomiting:  Hx jitteryness with compazine  Cont scop patch   Cont reglan  QID  Zofran  4mg  IV/PO Q 6 hour PRN nausea     Depression/Anxiety:  Continue home regimen (venlafaxine )   Continue Ativan  0.25mg  PO Q 6 hour PRN anxiety  Incorporate non-pharmacological treatment of anxiety when appropriate including emotional support, distraction strategies, and relaxation exercises.          4. Psychosocial: Palliative Care Therapist is following  Pt is single, 4 adult children  Her youngest son lives with her and has needs related to bipolar disorder dx    Case Management would be beneficial to follow along as pt has housing and financial concerns.      5. Spiritual: Catholic. Palliative Care Chaplain is following  6.  PC Team follow-up plans: tomorrow      Discharge Disposition: TBD      Outpatient Follow Up Recommended: Yes    Outcomes: Improved pain intervention and Improved non-pain symptom therapy    Counseling & coordination of care with Patient, referring provider, and Bedside Nurse with recommendations above.         Elveria Duncans, MSN, AG-ACNP-BC, ANP-BC, [facility]  Palliative Medicine and Comprehensive Care  Spectra : [phone] M-F,  8a-4:30p  Extend Pager: [phone],    8a-4:30p  Team Phone Number:  [phone], M-F 8a-4:30P  Team Xtend Pager: [phone], 8a-4:30p, Daily     Interval History    Valerie May is a 53 y.o. female admitted to hospital on 03/21/2023 with Generalized abdominal pain [R10.84]     A 10 point ROS was completed and is negative except for as documented     Past Family/Social History reviewed from H+P and unchanged    MAR review: 47.5 OME    Note: OMEs calculated using updated opioid conversion table Honor ML. Demystifying Opioid Conversion Calculations, A Guide for Effective Dosing, 2nd Edition. [employer]P; 2018.)    Pt awake in bed. C/o HA pain in neck and base of head. + Hx of migraines but feels like this one is different than prior. Chest pain earlier. Also complained of pain in arms and RLE. Did not complain about abdominal pain until asked and then pt pointed to center of epigastric region. + nausea but keeping down pills. Had some bites of breakfast.     Pt asked about her FNA results. I explained that it shows a neoplasm but it appears more testing may be warranted to further define type and treatment plan. Support offered. Pt expressed stress with remaining hospitalized as well as financial and family stress. She is agreeable to [facility] therapist and chaplain.     Pt has been requesting only IV opioids as well as specific IV opioids. Explained at length today the rationale for above regimen. Explained that she is having refractory nausea and that may be associated with the morphine  she was regularly taking. As the nausea is effecting her ability to take pills it warrants a rotation from morphine  to another opioid. Also explained recommendation to utilize oral opioids for longer duration of effect in comparison to IV. Attempting to transition to regimen that can be utilized on an outpatient basis.          Goals of Care   CURRENT CPR Status: Full Code          [place]  Health Care Decisions Act: Procedure in absence of an advance directive ([id number])  1. A guardian for the patient. This subdivision shall not be construed to require such appointment in order that a health care  decision can be made under this section; or  2. The patient's spouse except where a divorce action has been filed and the divorce is not final; or  3. An adult child of the patient; or  4. A parent of the patient; or  5. An adult brother or sister of the patient; or  6. Any other relative of the patient in the descending order of blood relationship; or  7. Except in cases in which the proposed treatment recommendation involves the withholding or withdrawing of a life-prolonging procedure, any adult, except any interior and spatial designer, employee, or agent of a health care provider currently involved in the care of the patient, who (i) has exhibited special care and concern for the patient and (ii) is familiar  with the patient's religious beliefs and basic values and any preferences previously expressed by the patient regarding health care, to the extent that they are known.  Source: https://law.[place] .gov/vacode/title54.1/chapter29/section54.01-2984/        Palliative Functional and Symptom Assessment     Palliative Performance Scale: 70% - Reduced ambulation, unable to do normal work, some evidence of disease, full self-care, normal or reduced intake, full LOC    [place] Symptom Assessment Scale (ESAS): Completed       Medications   Scheduled Meds  Current Facility-Administered Medications   Medication Dose Route Frequency    aluminum & magnesium  / diphenhydrAMINE  / lidocaine   15 mL Oral QID    aspirin  EC  81 mg Oral Daily    budesonide -formoterol   2 puff Inhalation BID    cetirizine   10 mg Oral Daily    enoxaparin   40 mg Subcutaneous Daily    fluconazole   400 mg Oral Q24H SCH    fluocinonide    Topical BID    HYDROmorphone   2 mg Oral Q6H    insulin  lispro  1-4 Units Subcutaneous QHS    insulin  lispro  1-8 Units Subcutaneous TID AC    metoclopramide   5 mg Oral QID    montelukast   10 mg Oral QHS    pantoprazole   40 mg Oral Q12H SCH    polycarbophil  625 mg Oral BID    polyethylene glycol  17 g Oral Daily    scopolamine   1 patch  Transdermal Q72H    senna-docusate  2 tablet Oral QHS    venlafaxine   25 mg Oral TID MEALS      DRIPS     PRN MEDS  PRN Medications[1]    Allergies   Allergies[2]    Physical Exam   BP 120/75   Pulse 92   Temp 98.3 F (36.8 C) (Oral)   Resp 17   Ht 1.499 m (4' 11)   Wt 80.4 kg (177 lb 4 oz)   LMP 03/18/2012   SpO2 96%   BMI 35.80 kg/m    Physical Exam:  General: woman in bed in  no acute distress   HEENT:  EOMI, sclera anicteric, OP/OC clear  Neck: supple, FROM   CV: regular rate and rhythm  Lungs:unlabored, no accessory muscle use  Abd: soft, NT, ND, +bs  Ext: no clubbing, cyanosis  Neuro: awake, alert, oriented x 3, no focal deficits  Psych:  appropriate insight and judgement, mood and affect  Skin: no rashes or lesions noted     Labs / Radiology   Lab and diagnostics: reviewed in Epic  Recent Labs   Lab 04/01/23  0332   WBC 8.31   Hemoglobin 14.2   Hematocrit 44.7*   Platelet Count 384*              Recent Labs   Lab 04/02/23  0415   Sodium 135   Potassium 4.0   Chloride 98*   CO2 27   BUN 8   Creatinine 0.7   GFR >60.0   Glucose 119*   Calcium  9.4     Recent Labs   Lab 04/01/23  0332 03/30/23  0430 03/28/23  1318   Bilirubin, Total 0.3  More results in Results Review 0.5   Bilirubin Direct  --   --  0.1   Protein, Total 7.2  More results in Results Review 7.1   Albumin 3.6  More results in Results Review 3.9   ALT 104*  More results in  Results Review 68*   AST (SGOT) 64*  More results in Results Review 28   More results in Results Review = values in this interval not displayed.          XR Chest AP Portable    Result Date: 03/28/2023   No acute cardiopulmonary disease. Reena D'Heureux, MD 03/28/2023 10:21 AM                [1]   Current Facility-Administered Medications   Medication Dose    acetaminophen   650 mg    albuterol   2.5 mg    alum & mag hydroxide-simethicone   30 mL    bengay greaseless      benzocaine -menthol   1 lozenge    benzonatate   200 mg    bisacodyl   10 mg     butalbital -acetaminophen -caffeine   1 tablet    carboxymethylcellulose sodium  1 drop    dextrose   15 g of glucose    Or    dextrose   12.5 g    Or    dextrose   12.5 g    Or    glucagon  (rDNA)  1 mg    HYDROmorphone   0.6 mg    HYDROmorphone   2 mg    lidocaine  viscous  5 mL    LORazepam   0.25 mg    Lubrifresh PM      magnesium  sulfate  1 g    melatonin  3 mg    naloxone   0.2 mg    ondansetron   8 mg    Or    ondansetron   4 mg    potassium & sodium phosphates   2 packet    potassium chloride   0-60 mEq    Or    potassium chloride   0-60 mEq    Or    potassium chloride   10 mEq    saline  2 spray   [2]   Allergies  Allergen Reactions    Benadryl  [Diphenhydramine ]     Dust Mite Extract     Methocarbamol Itching    Molds & Smuts     Other     Compazine [Prochlorperazine] Anxiety

## 2023-04-02 NOTE — Plan of Care (Signed)
 Nursing Progress Note - Shift Summary and Care Plan    Shift Summary:   Patient complained of headache, one time dose of sumatriptan  given. PRN ativan  x1 for anxiety. Refused scheduled morning oxycodone . Pain regimen adjusted this shift. One episode of nausea, prn zofran  x2. PRN dilaudid  x2. PRN Fioricet  x1 for headache.   Visit Vitals  BP 120/75   Pulse 92   Temp 98.3 F (36.8 C) (Oral)   Resp 17   Ht 1.499 m (4' 11)   Wt 80.4 kg (177 lb 4 oz)   LMP 03/18/2012   SpO2 96%   BMI 35.80 kg/m        Critical Events:   None    LDAs & Indication:  Patient Lines/Drains/Airways Status       Active Lines, Drains and Airways       Name Placement date Placement time Site Days    Peripheral IV 03/26/23 20 G Standard Anterior;Distal;Left Forearm 03/26/23  0550  Forearm  7                     Systems Overview:  Neuro:   A&Ox4   Resp:   On 2L NC   Cardiac:   No tele  GI/GU:   LBM 04/02/23      Nutrition:   Tolerating Regular diet.   +N/V, Zofran  given   Blood sugar checks ACHS no coverage needed  Activity/Safety/Mobility:   Patient is independent   Skin/Wounds:    Intact, bilateral rash on knees with creamed applied, scattered tattoos and scars       Treatments/Wound Care  VTE Prophylaxis:   Medication VTE Prophylaxis Orders: enoxaparin  (LOVENOX ) syringe 40 mg  Mechanical VTE Prophylaxis Orders: Maintain sequential compression device     Upcoming Tests/Procedures  Repeat EUS tomorrow, will be NPO at midnight tonight.     Discharge Plan:  TBD   Wean off IV pain meds   Planning to discharge home with family and friends     Care Plan     Problem: Pain interferes with ability to perform ADL  Goal: Pain at adequate level as identified by patient  Outcome: Progressing  Flowsheets (Taken 04/01/2023 2100 by Ferna Gerold, RN)  Pain at adequate level as identified by patient:   Identify patient comfort function goal   Assess for risk of opioid induced respiratory depression, including snoring/sleep apnea. Alert healthcare team of risk  factors identified.   Assess pain on admission, during daily assessment and/or before any as needed intervention(s)   Reassess pain within 30-60 minutes of any procedure/intervention, per Pain Assessment, Intervention, Reassessment (AIR) Cycle   Evaluate if patient comfort function goal is met   Evaluate patient's satisfaction with pain management progress   Offer non-pharmacological pain management interventions   Consult/collaborate with Pain Service   Consult/collaborate with Physical Therapy, Occupational Therapy, and/or Speech Therapy     Problem: Side Effects from Pain Analgesia  Goal: Patient will experience minimal side effects of analgesic therapy  Outcome: Progressing  Flowsheets (Taken 04/02/2023 1049)  Patient will experience minimal side effects of analgesic therapy:   Monitor/assess patient's respiratory status (RR depth, effort, breath sounds)   Assess for changes in cognitive function     Problem: Day of Admission - Heart Failure  Goal: Heart Failure Admission  Outcome: Progressing  Flowsheets (Taken 04/02/2023 1049)  Heart Failure Admission:   Vital signs and telemetry per policy   Strict Intake/Output     Problem: Everyday - Heart Failure  Goal: Stable Vital Signs and Fluid Balance  Outcome: Progressing  Flowsheets (Taken 04/02/2023 1049)  Stable Vital Signs and Fluid Balance:   Daily Standing Weights in the morning using the same scale, after using the bathroom and before breadfast.  If unable to stand, zero the bed and use the bed scale   Monitor labs and report abnormalities to physician   Strict Intake/Output  Goal: Mobility/Activity is Maintained at Optimal Level for Patient  Outcome: Progressing  Flowsheets (Taken 03/27/2023 0354 by Erla Drone, RN)  Mobility/Activity is Maintained at Optimal Level for Patient:   Increase mobility as tolerated/progressive mobility protocol   Assess for changes in respiratory status, level of consciousness and/or development of fatigue  Goal: Nutritional Intake is  Adequate  Outcome: Progressing  Flowsheets (Taken 03/26/2023 0301 by Erla Drone, RN)  Nutritional Intake is Adequate: Assess appetite,anorexia and amount of meal/food tolerated  Goal: Teaching-Using CHF Warning Zones and Educational Videos  Outcome: Progressing  Flowsheets (Taken 03/26/2023 0301 by Erla Drone, RN)  Teaching-Using CHF Warning Zones and Educational Videos:   Signs & Symptoms of CHF   Medications     Problem: Day of Discharge - Heart Failure  Goal: Discharge Education  Outcome: Progressing  Flowsheets (Taken 03/26/2023 0301 by Erla Drone, RN)  Day of Discharge:   After Visit Summary (Discharge Instuctions) with medications   CHF Warning Zones and when to call for help   Follow-up appointments   Daily Standing Weights   Low Sodium Diet     Problem: Compromised Activity/Mobility  Goal: Activity/Mobility Interventions  Outcome: Progressing  Flowsheets (Taken 04/02/2023 0827)  Activity/Mobility Interventions: Pad bony prominences, TAP Seated positioning system when OOB, Promote PMP, Reposition q 2 hrs / turn clock, Offload heels     Problem: Compromised Sensory Perception  Goal: Sensory Perception Interventions  Outcome: Progressing  Flowsheets (Taken 04/02/2023 0827)  Sensory Perception Interventions: Offload heels, Pad bony prominences, Reposition q 2hrs/turn Clock, Q2 hour skin assessment under devices if present     Problem: Compromised Moisture  Goal: Moisture level Interventions  Outcome: Progressing  Flowsheets (Taken 04/02/2023 0827)  Moisture level Interventions: Moisture wicking products, Moisture barrier cream     Problem: Compromised Nutrition  Goal: Nutrition Interventions  Outcome: Progressing  Flowsheets (Taken 04/02/2023 0827)  Nutrition Interventions: Discuss nutrition at Rounds, I&Os, Document % meal eaten, Daily weights     Problem: Compromised Friction/Shear  Goal: Friction and Shear Interventions  Outcome: Progressing  Flowsheets (Taken 04/02/2023 0827)  Friction and Shear Interventions: Pad  bony prominences, Off load heels, HOB 30 degrees or less unless contraindicated, Consider: TAP seated positioning, Heel foams     Problem: Pain  Goal: Pain at adequate level as identified by patient  Outcome: Progressing  Flowsheets (Taken 04/01/2023 2100 by Ferna Gerold, RN)  Pain at adequate level as identified by patient:   Identify patient comfort function goal   Assess for risk of opioid induced respiratory depression, including snoring/sleep apnea. Alert healthcare team of risk factors identified.   Assess pain on admission, during daily assessment and/or before any as needed intervention(s)   Reassess pain within 30-60 minutes of any procedure/intervention, per Pain Assessment, Intervention, Reassessment (AIR) Cycle   Evaluate if patient comfort function goal is met   Evaluate patient's satisfaction with pain management progress   Offer non-pharmacological pain management interventions   Consult/collaborate with Pain Service   Consult/collaborate with Physical Therapy, Occupational Therapy, and/or Speech Therapy

## 2023-04-02 NOTE — Progress Notes (Signed)
 Quick Doc  [facility]   Patient Name: [facility] D   Attending Physician: Erick Larve, DO   Today's date:   04/02/2023 LOS: 11 days   Expected Discharge Date      Quick  Assessment:                                                              ReAdmit Risk Score: 15.69    CM Comments: 3/4: esophageal candidiasis & pancreatic mass, pending pathology.  DISPO: home w/family & friends-  MCD approved    Discussed in MDR this morning.  Palliative consulted for symptom management, may need repeat EUS for pancreatic mass biopsy, previous sample not sufficient.                                                                                    Provider Notifications:

## 2023-04-02 NOTE — Consults (Signed)
 Inpatient Solid Tumor Team  Secure Chat: [facility] *preferred contact*  Spectra  [phone]  After hours call [phone]      INPATIENT ONCOLOGY CONSULTATION    Date Time: 04/02/23 9:46 AM  Patient Name: Valerie May D  Requesting Physician: Erick Larve, DO  Attending Oncologist:     Reason for Consultation:   Pancreatic mass    Assessment:   53 y/o female with asthma, esoinophilic bronchiectasis, t2dm presenting with n/v, abdominal and chest pain, found to have 1.2x1.1cm pancreatic lesion, biopsy of which is indeterminate but suspicious for NEN, acinar cell carcinoma, or solid pseudopapillary neoplasm.     Plan:     Recommend repeat attempt at EUS + biopsy as per GI, need pathology to drive management  Recommend surg onc consultation  [id number], chromogranin a ordered    Attending Note:  Patient seen/examined.  I performed the substantive portion of this visit by personally conducting the medical decision making in its entirety.  I have reviewed and updated the documented findings and plan of care accordingly.  Patient was also seen by Brad Fireman, MD, hematology/oncology fellow    Assessment:  Small enhancing mass lesion in the HOP, initial EUS non-diagnostic but suspicious for NET vs ACC. I do not think this is the cause of her symptoms.   Esophageal candidiasis  Epigastric pain with nausea/vomiting    Plan:  Repeat EUS and biopsy  Surgical oncology consultation  Will follow    Quintin Moat, MD  Gastrointestinal Oncology  [facility]      History:   Valerie May is a 53 y.o. female with history of asthma, eosinophilic bronchiectasis, recurrent pneumonia, migraines, GERD, T2DM who presents to the hospital on 03/21/2023 with 4 months of progressive intermittent nausea, vomiting, shortness of breath, and epigastric abdominal and chest pain.    CT chest abdomen pelvis with hyperdense pancreatic head lesion, no pulmonary embolism, no other distant disease.    MRCP  2/25:  1.Pancreatic head 1.2 cm enhancing lesion, suspected neoplasm. This lesion  appears confined of the pancreas and demonstrates no major vascular  interfaces. Recommend EUS for further evaluation.  2.No findings suspicious for metastatic disease within the imaged abdomen.  3.Mid right renal 1.7 cm cyst, recently demonstrating intermediate density  by CT and is benign.  4.Hepatic steatosis.  5.Colonic diverticulosis.    2/26 EUS/EGD with [facility].  Diffuse white plaques in the esophagus, confirmed Candida, gastritis noted on EGD.  EUS was notable for a round mass in the pancreatic head, hypoechoic.  1.2 x 1.1 cm with well-defined borders.  Biopsy obtained.    Pathology of pancreatic head mass was consistent with neoplasm, although low cellularity on block.  Differential including acinar cell carcinoma, neuroendocrine neoplasm and solid pseudopapillary neoplasm.     Pain has somewhat impoved with treatment of candida    Past Medical History:     Past Medical History:   Diagnosis Date    COVID-19     COVID-19 2021    Heart valve problem     Kidney stone     Pneumonia        Past Surgical History:   Past Surgical History[1]    Family History:   Family History[2]    Social History:     Social History     Socioeconomic History    Marital status: Single     Spouse name: Not on file    Number of children: Not on file    Years of education:  Not on file    Highest education level: Not on file   Occupational History    Not on file   Tobacco Use    Smoking status: Never     Passive exposure: Current    Smokeless tobacco: Never   Vaping Use    Vaping status: Never Used   Substance and Sexual Activity    Alcohol  use: Not Currently    Drug use: Never    Sexual activity: Not Currently   Other Topics Concern    Not on file   Social History Narrative    Not on file     Social Drivers of Health     Financial Resource Strain: High Risk (03/23/2023)    Overall Financial Resource Strain (CARDIA)     Difficulty of Paying Living  Expenses: Very hard   Food Insecurity: No Food Insecurity (03/21/2023)    Hunger Vital Sign     Worried About Running Out of Food in the Last Year: Never true     Ran Out of Food in the Last Year: Never true   Transportation Needs: Unmet Transportation Needs (03/23/2023)    PRAPARE - Therapist, Art (Medical): Yes     Lack of Transportation (Non-Medical): Yes   Physical Activity: Not on file   Stress: Not on file   Social Connections: Not on file   Intimate Partner Violence: Not At Risk (03/21/2023)    Humiliation, Afraid, Rape, and Kick questionnaire     Fear of Current or Ex-Partner: No     Emotionally Abused: No     Physically Abused: No     Sexually Abused: No   Housing Stability: High Risk (03/23/2023)    Housing Stability Vital Sign     Unable to Pay for Housing in the Last Year: Yes     Number of Times Moved in the Last Year: 5     Homeless in the Last Year: No       Allergies:   Allergies[3]    Medications:     Current Facility-Administered Medications   Medication Dose Route Frequency    aluminum & magnesium  / diphenhydrAMINE  / lidocaine   15 mL Oral QID    aspirin  EC  81 mg Oral Daily    budesonide -formoterol   2 puff Inhalation BID    cetirizine   10 mg Oral Daily    enoxaparin   40 mg Subcutaneous Daily    fluconazole   400 mg Oral Q24H SCH    fluocinonide    Topical BID    insulin  lispro  1-4 Units Subcutaneous QHS    insulin  lispro  1-8 Units Subcutaneous TID AC    metoclopramide   5 mg Oral QID    montelukast   10 mg Oral QHS    oxyCODONE   10 mg Oral Q6H    pantoprazole   40 mg Oral Q12H SCH    polycarbophil  625 mg Oral BID    polyethylene glycol  17 g Oral Daily    scopolamine   1 patch Transdermal Q72H    senna-docusate  2 tablet Oral QHS    SUMAtriptan  succinate  6 mg Subcutaneous Once    venlafaxine   25 mg Oral TID MEALS       Physical Exam:     Vitals:    04/02/23 0759   BP: 125/78   Pulse: 90   Resp: 16   Temp: 98 F (36.7 C)   SpO2: 96%       Intake and Output  Summary (Last 24  hours) at Date Time    Intake/Output Summary (Last 24 hours) at 04/02/2023 0946  Last data filed at 04/02/2023 0220  Gross per 24 hour   Intake 200 ml   Output 2100 ml   Net -1900 ml       Gen: Comfortable appearing 53 y.o. female. NAD.   Neuro: AOX4. No gross assymetries in muscles of facial expression. Moving all extremities against gravity  HEENT: atraumatic, normocephalic. Normal conjunctiva  Neck: Supple, normal ROM.   Pulm: CTAB. Normal work of breathing  Cards: RRR, no m/r/g.   Abd: soft, non-distended, non rigid. NTTP.  Extremities: no edema  Skin: warm and dry. No rashes or lesions      Labs Reviewed:     Results       Procedure Component Value Units Date/Time    Culture, Sputum and Lower Respiratory [[id number]] Collected: 04/02/23 0847    Specimen: Sputum, Expectorated Updated: 04/02/23 0859    Whole Blood Glucose POCT [[id number]]  (Abnormal) Collected: 04/02/23 0759    Specimen: Blood, Capillary Updated: 04/02/23 0801     Whole Blood Glucose POCT 121 mg/dL     Basic Metabolic Panel [[id number]]  (Abnormal) Collected: 04/02/23 0415    Specimen: Blood, Venous Updated: 04/02/23 0527     Glucose 119 mg/dL      BUN 8 mg/dL      Creatinine 0.7 mg/dL      Calcium  9.4 mg/dL      Sodium 864 mEq/L      Potassium 4.0 mEq/L      Chloride 98 mEq/L      CO2 27 mEq/L      Anion Gap 10.0     GFR >60.0 mL/min/1.73 m2     Whole Blood Glucose POCT [[id number]]  (Abnormal) Collected: 04/01/23 2125    Specimen: Blood, Capillary Updated: 04/01/23 2133     Whole Blood Glucose POCT 128 mg/dL     Whole Blood Glucose POCT [[id number]]  (Abnormal) Collected: 04/01/23 1514    Specimen: Blood, Capillary Updated: 04/01/23 1515     Whole Blood Glucose POCT 134 mg/dL     Whole Blood Glucose POCT [[id number]]  (Abnormal) Collected: 04/01/23 1053    Specimen: Blood, Capillary Updated: 04/01/23 1210     Whole Blood Glucose POCT 145 mg/dL               Rads:   Radiological Procedure reviewed.     Signed by: Brad FORBES Fireman, MD        [facility]  [address]   [place] [address]  [phone]         [1]   Past Surgical History:  Procedure Laterality Date    CYST REMOVAL      ESOPHAGOGASTRODUODENOSCOPY (EGD), ESOPHAGEAL ULTRASOUND (EUS) WITH FINE NEEDLE ASPIRATION/BIOPSY OF ESOPHAGUS, STOMACH, OR DUODENUM N/A 03/25/2023    Procedure: ESOPHAGOGASTRODUODENOSCOPY (EGD), ESOPHAGEAL ULTRASOUND (EUS) WITH FINE NEEDLE ASPIRATION/BIOPSY OF ESOPHAGUS, STOMACH, OR DUODENUM;  Surgeon: Abagail Carole HERO, MD;  Location: [facility];  Service: Gastroenterology;  Laterality: N/A;    GALLBLADDER SURGERY      HERNIA REPAIR      HYSTERECTOMY      TUBAL LIGATION     [2]   Family History  Problem Relation Name Age of Onset    Heart attack Mother      Diabetes Father      Heart attack Maternal Grandfather      Heart attack Paternal  Grandfather      Stroke Son      Heart attack Maternal Uncle      Stomach cancer Cousin      Bone cancer Maternal Aunt      Colon cancer Neg Hx      Breast cancer Neg Hx     [3]   Allergies  Allergen Reactions    Benadryl  [Diphenhydramine ]     Dust Mite Extract     Methocarbamol Itching    Molds & Smuts     Other     Compazine [Prochlorperazine] Anxiety

## 2023-04-03 ENCOUNTER — Encounter: Payer: Self-pay | Admitting: Internal Medicine

## 2023-04-03 ENCOUNTER — Encounter
Admission: EM | Disposition: A | Discharge status: Home / Self Care | Payer: Self-pay | Source: Home / Self Care | Attending: Hospitalist

## 2023-04-03 ENCOUNTER — Inpatient Hospital Stay

## 2023-04-03 ENCOUNTER — Inpatient Hospital Stay: Admitting: Anesthesiology

## 2023-04-03 DIAGNOSIS — G9341 Metabolic encephalopathy: Secondary | ICD-10-CM

## 2023-04-03 DIAGNOSIS — J9622 Acute and chronic respiratory failure with hypercapnia: Secondary | ICD-10-CM

## 2023-04-03 DIAGNOSIS — R739 Hyperglycemia, unspecified: Secondary | ICD-10-CM

## 2023-04-03 DIAGNOSIS — J9621 Acute and chronic respiratory failure with hypoxia: Secondary | ICD-10-CM

## 2023-04-03 HISTORY — PX: ESOPHAGOGASTRODUODENOSCOPY (EGD), ESOPHAGEAL ULTRASOUND (EUS) WITH FINE NEEDLE ASPIRATION/BIOPSY OF ESOPHAGUS, STOMACH, OR DUODENUM: SHX3897

## 2023-04-03 LAB — CBC
Absolute nRBC: 0 10*3/uL (ref <=0.00)
Hematocrit: 42.9 % (ref 34.7–43.7)
Hemoglobin: 13.5 g/dL (ref 11.4–14.8)
MCH: 29.6 pg (ref 25.1–33.5)
MCHC: 31.5 g/dL (ref 31.5–35.8)
MCV: 94.1 fL (ref 78.0–96.0)
MPV: 9.5 fL (ref 8.9–12.5)
Platelet Count: 396 10*3/uL — ABNORMAL HIGH (ref 142–346)
RBC: 4.56 10*6/uL (ref 3.90–5.10)
RDW: 12 % (ref 11–15)
WBC: 9.02 10*3/uL (ref 3.10–9.50)
nRBC %: 0 /100{WBCs} (ref <=0.0)

## 2023-04-03 LAB — VENOUS EG7 POCT
Calcium Ionized POCT: 2.5 meq/L (ref 2.44–2.64)
FIO2: 50
Hematocrit POCT: 41 % (ref 34.7–43.7)
Hemoglobin POCT: 13.9 g/dL (ref 12.0–15.6)
Patient Temperature: 98
Potassium POCT: 4.5 meq/L (ref 3.5–4.9)
Sodium POCT: 137 meq/L (ref 136–146)
Venous Base Excess POCT: 7 meq/L — ABNORMAL HIGH (ref <=6.0)
Venous HCO3 Bicarbonate: 38.8 meq/L — ABNORMAL HIGH (ref 23.5–31.3)
Venous O2 Saturation POCT: 71 % (ref 24.0–95.0)
Venous Total CO2: 42 meq/L — ABNORMAL HIGH (ref 23.0–33.0)
Venous pCO2 POCT: 95.2 mmHg — ABNORMAL HIGH (ref 41.0–58.0)
Venous pH POCT: 7.216 — ABNORMAL LOW (ref 7.310–7.410)
Venous pO2 POCT: 47 mmHg (ref 17.0–59.0)

## 2023-04-03 LAB — BASIC METABOLIC PANEL
Anion Gap: 7 (ref 5.0–15.0)
BUN: 7 mg/dL (ref 7–21)
CO2: 31 meq/L — ABNORMAL HIGH (ref 17–29)
Calcium: 9.6 mg/dL (ref 8.5–10.5)
Chloride: 97 meq/L — ABNORMAL LOW (ref 99–111)
Creatinine: 0.6 mg/dL (ref 0.4–1.0)
GFR: >60 mL/min/{1.73_m2} (ref >=60.0)
Glucose: 106 mg/dL — ABNORMAL HIGH (ref 70–100)
Potassium: 4.4 meq/L (ref 3.5–5.3)
Sodium: 135 meq/L (ref 135–145)

## 2023-04-03 LAB — WHOLE BLOOD GLUCOSE POCT
Whole Blood Glucose POCT: 123 mg/dL — ABNORMAL HIGH (ref 70–100)
Whole Blood Glucose POCT: 151 mg/dL — ABNORMAL HIGH (ref 70–100)
Whole Blood Glucose POCT: 132 mg/dL — ABNORMAL HIGH (ref 70–100)
Whole Blood Glucose POCT: 115 mg/dL — ABNORMAL HIGH (ref 70–100)
Whole Blood Glucose POCT: 148 mg/dL — ABNORMAL HIGH (ref 70–100)

## 2023-04-03 LAB — NARES, MRSA (METHICILLIN-RESISTANT STAPHYLOCOCCUS AUREUS) SCREENING, PCR: MRSA (methicillin resistant Staphylococcus aureus) DNA: NOT DETECTED

## 2023-04-03 SURGERY — ESOPHAGOGASTRODUODENOSCOPY (EGD), ESOPHAGEAL ULTRASOUND (EUS) WITH FINE NEEDLE ASPIRATION/BIOPSY OF ESOPHAGUS, STOMACH, OR DUODENUM
Anesthesia: Anesthesia General

## 2023-04-03 MED ORDER — CARBOXYMETHYLCELLULOSE SOD PF 0.5 % OP SOLN
1.0000 [drp] | OPHTHALMIC | Status: DC | PRN
Start: 2023-04-03 — End: 2023-04-03

## 2023-04-03 MED ORDER — PHENYLEPHRINE 100 MCG/ML IV BOLUS (ANESTHESIA)
PREFILLED_SYRINGE | INTRAVENOUS | Status: DC | PRN
Start: 2023-04-03 — End: 2023-04-03
  Administered 2023-04-03: 200 ug via INTRAVENOUS

## 2023-04-03 MED ORDER — KETOROLAC TROMETHAMINE 30 MG/ML IJ SOLN
30.0000 mg | Freq: Four times a day (QID) | INTRAMUSCULAR | Status: DC | PRN
Start: 2023-04-03 — End: 2023-04-08
  Administered 2023-04-03 – 2023-04-07 (×4): 30 mg via INTRAVENOUS
  Filled 2023-04-03 (×4): qty 1

## 2023-04-03 MED ORDER — ALBUTEROL SULFATE HFA 108 (90 BASE) MCG/ACT IN AERS
INHALATION_SPRAY | RESPIRATORY_TRACT | Status: DC | PRN
Start: 2023-04-03 — End: 2023-04-03
  Administered 2023-04-03: 4 via RESPIRATORY_TRACT
  Administered 2023-04-03: 6 via RESPIRATORY_TRACT

## 2023-04-03 MED ORDER — ONDANSETRON HCL 4 MG/2ML IJ SOLN
INTRAMUSCULAR | Status: AC
Start: 2023-04-03 — End: ?
  Filled 2023-04-03: qty 2

## 2023-04-03 MED ORDER — SUCCINYLCHOLINE CHLORIDE 20 MG/ML IJ SOLN
INTRAMUSCULAR | Status: DC | PRN
Start: 2023-04-03 — End: 2023-04-03
  Administered 2023-04-03: 140 mg via INTRAVENOUS

## 2023-04-03 MED ORDER — ESMOLOL HCL 100 MG/10ML IV SOLN
INTRAVENOUS | Status: DC | PRN
Start: 2023-04-03 — End: 2023-04-03
  Administered 2023-04-03: 20 mg via INTRAVENOUS
  Administered 2023-04-03: 30 mg via INTRAVENOUS

## 2023-04-03 MED ORDER — PROPOFOL INFUSION 10 MG/ML
0.0000 ug/kg/min | INTRAVENOUS | Status: DC
Start: 2023-04-03 — End: 2023-04-03

## 2023-04-03 MED ORDER — PROPOFOL 10 MG/ML IV EMUL (WRAP)
INTRAVENOUS | Status: AC
Start: 2023-04-03 — End: ?
  Filled 2023-04-03: qty 20

## 2023-04-03 MED ORDER — LIDOCAINE HCL (PF) 2 % IJ SOLN
INTRAMUSCULAR | Status: AC
Start: 2023-04-03 — End: ?
  Filled 2023-04-03: qty 5

## 2023-04-03 MED ORDER — PROPOFOL 10 MG/ML IV EMUL (WRAP)
INTRAVENOUS | Status: DC | PRN
Start: 2023-04-03 — End: 2023-04-03
  Administered 2023-04-03: 30 mg via INTRAVENOUS
  Administered 2023-04-03: 160 mg via INTRAVENOUS

## 2023-04-03 MED ORDER — ONDANSETRON HCL 4 MG/2ML IJ SOLN
INTRAMUSCULAR | Status: DC | PRN
Start: 2023-04-03 — End: 2023-04-03
  Administered 2023-04-03: 4 mg via INTRAVENOUS

## 2023-04-03 MED ORDER — LACTATED RINGERS IV SOLN
INTRAVENOUS | Status: DC
Start: 2023-04-03 — End: 2023-04-03

## 2023-04-03 MED ORDER — PROPOFOL INFUSION 10 MG/ML
INTRAVENOUS | Status: DC | PRN
Start: 2023-04-03 — End: 2023-04-03
  Administered 2023-04-03: 50 ug/kg/min via INTRAVENOUS

## 2023-04-03 MED ORDER — MORPHINE SULFATE 2 MG/ML IJ/IV SOLN (WRAP)
1.0000 mg | Status: DC | PRN
Start: 2023-04-03 — End: 2023-04-04
  Administered 2023-04-03 (×2): 1 mg via INTRAVENOUS
  Filled 2023-04-03 (×2): qty 1

## 2023-04-03 MED ORDER — LUBRIFRESH P.M. OP OINT
TOPICAL_OINTMENT | OPHTHALMIC | Status: DC | PRN
Start: 2023-04-03 — End: 2023-04-03

## 2023-04-03 MED ORDER — LIDOCAINE HCL 2 % IJ SOLN
INTRAMUSCULAR | Status: DC | PRN
Start: 2023-04-03 — End: 2023-04-03
  Administered 2023-04-03: 80 mg

## 2023-04-03 SURGICAL SUPPLY — 40 items
BLOCK BITE OD60 FR BLOX (Other) ×1 IMPLANT
CONNECTOR IRRIGATION AUXILIARY WATER JET (Connector) ×1 IMPLANT
CONNECTOR IRRIGATION AUXILIARY WATER JET 1 WAY VALVE HYDRA (Connector) ×1 IMPLANT
CONTAINER CYTOLOGY CELL FIXATION SELF (Lab Supplies) IMPLANT
CONTAINER CYTOLOGY CELL FIXATION SELF CONTAINED MODIFIED SACCOMANNO (Lab Supplies) IMPLANT
CONTAINER HISTOLOGY 60 ML 30 ML GRADUATE LEAK RESISTANT O RING PREFILL (Procedure Accessories) IMPLANT
FORCEPS BIOPSY L240 CM STANDARD CAPACITY (Disposable Instruments) IMPLANT
FORCEPS BIOPSY L240 CM STANDARD CAPACITY NEEDLE OD2.2 MM RADIAL JAW (Disposable Instruments) IMPLANT
GLOVE EXAM LARGE NITRILE CHEMOTHERAPY POWDER FREE SENSE OATMEAL (Glove) ×1 IMPLANT
GLOVE EXAM NITRILE RESTORE LG (Glove) ×1 IMPLANT
GOWN SRG LG SMARTGOWN LF STRL LVL 4 (Gown) ×1 IMPLANT
GOWN SRGCL LG LEVEL 4 BRTHBL STRL LF DSPSBL SMARTGOWN (Gown) ×1 IMPLANT
KIT ENDOSCOPIC COMPLIANCE ENDOKIT (Kits) ×1 IMPLANT
KIT ENDOSCOPIC COMPLIANCE ENDOKIT ORCAPOD 3 1.1 OZ (Kits) ×1 IMPLANT
MANIFOLD SUCTION 2 STANDARD 4 PORT (Filter) ×1 IMPLANT
MANIFOLD SUCTION 2 STANDARD 4 PORT NEPTUNE 2 WASTE MANAGEMENT SYSTEM (Filter) ×1 IMPLANT
NEEDLE ASPIRATION EXPECT OD19 GA (Needles) IMPLANT
NEEDLE ASPIRATION EXPECT OD22 GA (Needles) IMPLANT
NEEDLE ASPIRATION EXPECT OD25 GA (Needles) IMPLANT
NEEDLE ASPIRATION EXPECTâ„¢ OD19 GA ODSEC1.73 MM L8- CM ECHOGENIC (Needles) IMPLANT
NEEDLE ASPIRATION EXPECTâ„¢ OD22 GA ODSEC1.83 MM L8- CM ECHOGENIC (Needles) IMPLANT
NEEDLE ASPIRATION EXPECTâ„¢ OD25 GA ODSEC1.83 MM L8- CM ECHOGENIC (Needles) IMPLANT
NEEDLE BIOPSY OD19 GA ODSEC1.14 MM (Needles) IMPLANT
NEEDLE BIOPSY OD19 GA ODSEC1.14 MM ACQUIRE ULTRASOUND (Needles) IMPLANT
NEEDLE BIOPSY OD22 GA ACQUIRE FINE (Needles) IMPLANT
NEEDLE BIOPSY OD22 GA ACQUIREâ„¢ FINE ENDOSCOPIC ULTRASOUND (Needles) IMPLANT
NEEDLE BIOPSY OD25 GA ACQUIRE (Needles) IMPLANT
NEEDLE BIOPSY OD25 GA ACQUIRE ENDOSCOPIC ULTRASOUND FINE NEEDLE (Needles) IMPLANT
SET CAP 24 HOUR ERBEFLOW TUBING ENDOSCOPY PUMP (Endoscopic Supplies) ×1 IMPLANT
SET TUBING ERBEFLOW STRL CAP 24 HR DISP (Endoscopic Supplies) ×1 IMPLANT
SOL FORMALIN 10% PREFILL 30ML (Procedure Accessories) IMPLANT
SYRINGE 50 ML GRADUATE NONPYROGENIC DEHP (Syringes, Needles) ×1 IMPLANT
SYRINGE 50 ML GRADUATE NONPYROGENIC DEHP FREE PVC FREE BD MEDICAL (Syringes, Needles) ×1 IMPLANT
SYSTEM BIOPSY NEEDLE SHARKCORE FNB OD22 (Endoscopic Supplies) IMPLANT
SYSTEM BIOPSY NEEDLE SHARKCORE FNB OD22 GA NITINOL (Endoscopic Supplies) IMPLANT
SYSTEM BIOPSY NEEDLE SHARKCORE FNB OD25 (Needles) IMPLANT
SYSTEM BIOPSY NEEDLE SHARKCORE FNB OD25 GA NITINOL (Needles) IMPLANT
TUBING SUCTION OD3/16 IN L12 FT FEMALE (Tubing) ×1 IMPLANT
TUBING SUCTION OD3/16 IN L12 FT FEMALE CONNECTOR RIBBED UNIVERSAL (Tubing) ×1 IMPLANT
WATER STERILE PVC FREE DEHP FREE 1000 ML (Solution) ×2 IMPLANT

## 2023-04-03 NOTE — H&P (Signed)
 [x]  Patient seen prior to planned EGD, EUS, FNB  [x]  I confirmed key history components and performed focused examination.    [x]  Reviewed Laboratory data and Imaging.  [x]  The Indication for planned endoscopic procedure is appropriate.    CLINICAL DIAGNOSIS:   53 y/o female with asthma, esoinophilic bronchiectasis, t2dm presenting with n/v, abdominal and chest pain, found to have 1.2x1.1cm pancreatic lesion, biopsy of which is indeterminate but suspicious for NEN, acinar cell carcinoma, or solid pseudopapillary neoplasm. Plan for additional sampling of the pancreatic lesion.    INFORMED CONSENT:  [x]  Patient is able to consent       [x]  Video Interpretation Used    [x]  Informed about benefits of the procedure.  [x]  Discussed rare risks of bleeding, perforation and missed lesions.   [x]  Rare need for emergent surgery and repeat procedures was addressed.   [x]  Opportunity to ask questions was given.   [x]  Shared informed consent was obtained.    BRIEF PHYSICAL EXAMINATION:    Vitals:    04/03/23 0706   BP: 115/68   Pulse: 92   Resp: 19   Temp: 97.5 F (36.4 C)   SpO2: 96%      General: Alert and cooperative  Lungs: Lungs clear to auscultation  Cardiac: RRR, normal S1S2.    Abdomen: Soft, non tender. Normal active bowel sounds  Neurological: No focal weakness    Allergies[1]    Medical History[2]    Past Surgical History[3]      Lab Results   Component Value Date/Time    HCT 42.9 04/03/2023 03:02 AM    HCT 48.8 (H) 11/07/2020 02:48 PM    PLT 396 (H) 04/03/2023 03:02 AM    PLT 403 (H) 11/07/2020 02:48 PM    WBC 9.02 04/03/2023 03:02 AM    WBC 9.23 11/07/2020 02:48 PM    WBC 9.00 07/06/2007 12:58 AM      Lab Results   Component Value Date/Time    BUN 7 04/03/2023 03:02 AM    BUN 10.0 11/07/2020 02:48 PM    CREAT 0.6 04/03/2023 03:02 AM    CREAT 0.6 11/07/2020 02:48 PM    NA 135 04/03/2023 03:02 AM    NA 139 11/07/2020 02:48 PM    K 4.4 04/03/2023 03:02 AM    K 3.9 11/07/2020 02:48 PM      Lab Results   Component Value  Date/Time    BILITOTAL 0.3 04/01/2023 03:32 AM    BILITOTAL 1.2 11/07/2020 02:48 PM    AST 64 (H) 04/01/2023 03:32 AM    AST 17 11/07/2020 02:48 PM    ALT 104 (H) 04/01/2023 03:32 AM    ALT 19 11/07/2020 02:48 PM    ALKPHOS 199 (H) 04/01/2023 03:32 AM    ALKPHOS 111 11/07/2020 02:48 PM    ALB 3.6 04/01/2023 03:32 AM    ALB 4.3 11/07/2020 02:48 PM      Lab Results   Component Value Date/Time    PT 11.0 03/25/2023 03:18 AM    PT 13.7 (H) 07/05/2007 09:25 PM    INR 1.0 03/25/2023 03:18 AM    INR 1.2 (H) 07/05/2007 09:25 PM           [1]   Allergies  Allergen Reactions    Benadryl  [Diphenhydramine ]     Dust Mite Extract     Methocarbamol Itching    Molds & Smuts     Other     Compazine [Prochlorperazine] Anxiety   [2]  Past Medical History:  Diagnosis Date    COVID-19     COVID-19 2021    Heart valve problem     Kidney stone     Pneumonia    [3]   Past Surgical History:  Procedure Laterality Date    CYST REMOVAL      ESOPHAGOGASTRODUODENOSCOPY (EGD), ESOPHAGEAL ULTRASOUND (EUS) WITH FINE NEEDLE ASPIRATION/BIOPSY OF ESOPHAGUS, STOMACH, OR DUODENUM N/A 03/25/2023    Procedure: ESOPHAGOGASTRODUODENOSCOPY (EGD), ESOPHAGEAL ULTRASOUND (EUS) WITH FINE NEEDLE ASPIRATION/BIOPSY OF ESOPHAGUS, STOMACH, OR DUODENUM;  Surgeon: Abagail Carole HERO, MD;  Location: [facility];  Service: Gastroenterology;  Laterality: N/A;    GALLBLADDER SURGERY      HERNIA REPAIR      HYSTERECTOMY      TUBAL LIGATION

## 2023-04-03 NOTE — Plan of Care (Signed)
 4 eyes in 4 hours pressure injury assessment note:      Completed with: Lorene RN  Unit & Time admitted: 04/03/23 1120 AM             Bony Prominences: Check appropriate box; if wound is present enter wound assessment in LDA     Occiput:                 [x] WNL  []  Wound present  Face:                     [x] WNL  []  Wound present  Ears:                      [x] WNL  []  Wound present  Spine:                    [x] WNL  []  Wound present  Shoulders:             [x] WNL  []  Wound present  Elbows:                  [x] WNL  []  Wound present  Sacrum/coccyx:     [x] WNL  []  Wound present  Ischial Tuberosity:  [x] WNL  []  Wound present  Trochanter/Hip:      [x] WNL  []  Wound present  Knees:                   [x] WNL  []  Wound present  Ankles:                   [x] WNL  []  Wound present  Heels:                    [x] WNL  []  Wound present  Other pressure areas:  []  Wound location       Device related: []  Device name:         LDA completed if wound present: yes/no  Consult WOCN if necessary    Other skin related issues, ie tears, rash, etc, document in Integumentary flowsheet      CICU Nursing Progress Note    Valerie INFANTINO is a 53 y.o. female   Admitted 03/21/2023  4:55 AM Hospital Day: 14     Shift Events:    Follows Commands, propofol  gtt turned off. SBT done.   Extubated by RT to BIPAP, weaned to [facility] @ 6LPM  With downgrade orders      Drips:    IV Infusions[1]       Patient Lines/Drains/Airways Status       Active Lines, Drains and Airways       Name Placement date Placement time Site Days    Peripheral IV 04/02/23 20 G Standard Right Forearm 04/02/23  2127  Forearm  less than 1    Peripheral IV 04/03/23 22 G Anterior;Left Wrist 04/03/23  1040  Wrist  less than 1    External Urinary Catheter 04/03/23  1200  --  less than 1                   Care Plan Notes     Problem: Pain interferes with ability to perform ADL  Goal: Pain at adequate level as identified by patient  Outcome: Progressing  Flowsheets (Taken 04/01/2023 2100 by Ferna Gerold, RN)  Pain at adequate level as identified by patient:   Identify patient comfort function goal   Assess for  risk of opioid induced respiratory depression, including snoring/sleep apnea. Alert healthcare team of risk factors identified.   Assess pain on admission, during daily assessment and/or before any as needed intervention(s)   Reassess pain within 30-60 minutes of any procedure/intervention, per Pain Assessment, Intervention, Reassessment (AIR) Cycle   Evaluate if patient comfort function goal is met   Evaluate patient's satisfaction with pain management progress   Offer non-pharmacological pain management interventions   Consult/collaborate with Pain Service   Consult/collaborate with Physical Therapy, Occupational Therapy, and/or Speech Therapy     Problem: Compromised Activity/Mobility  Goal: Activity/Mobility Interventions  Outcome: Progressing  Flowsheets (Taken 04/02/2023 0827 by Onita Dixon, RN)  Activity/Mobility Interventions: Pad bony prominences, TAP Seated positioning system when OOB, Promote PMP, Reposition q 2 hrs / turn clock, Offload heels     Problem: Compromised Moisture  Goal: Moisture level Interventions  Outcome: Progressing  Flowsheets (Taken 04/02/2023 0827 by Onita Dixon, RN)  Moisture level Interventions: Moisture wicking products, Moisture barrier cream     Problem: Pain  Goal: Pain at adequate level as identified by patient  Outcome: Progressing  Flowsheets (Taken 04/01/2023 2100 by Ferna Gerold, RN)  Pain at adequate level as identified by patient:   Identify patient comfort function goal   Assess for risk of opioid induced respiratory depression, including snoring/sleep apnea. Alert healthcare team of risk factors identified.   Assess pain on admission, during daily assessment and/or before any as needed intervention(s)   Reassess pain within 30-60 minutes of any procedure/intervention, per Pain Assessment, Intervention, Reassessment (AIR) Cycle   Evaluate if patient  comfort function goal is met   Evaluate patient's satisfaction with pain management progress   Offer non-pharmacological pain management interventions   Consult/collaborate with Pain Service   Consult/collaborate with Physical Therapy, Occupational Therapy, and/or Speech Therapy            [1]   Current Facility-Administered Medications   Medication Dose Last Admin

## 2023-04-03 NOTE — Progress Notes (Signed)
 Patient for EGD with EUS. Intubated for procedure. Noted to have food in stomach. At end of case, patient with ETCO2 80-120 with hypoventilation.  All anestheticg off, patient did not receive any narcotics. BL = BS no wheeze. Given albuterol  neb. Patient still somnolent. Decision made to keep patient intubation. Suspect baseline OSA

## 2023-04-03 NOTE — Consults (Signed)
 Consult Note  Hepatobiliary Surgery / Surgical Oncology  Team Spectralink [id number]    Date Time: 04/03/23 11:38 AM  Patient Name: Valerie May  Attending Physician: Elinor Coy, MD  Chief Complaint: Pancreatic Head Mass    History of Present Illness:   Valerie May is a 53 y.o. female who presented to the ED on 2/22 for acute worsening of nausea and vomiting x 2 months with SOB.  In the ED, she was tachycardic and hypoxic with leukocytosis (WBC 18).  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  She was found to have acute respiratory acidosis, which was felt to be related to narcotic use in the setting of untreated OSA, possibly in the setting of underlying chronic lung disease/asthma following COVID PNA in the past.  She was admitted to the medicine team and improved with nasal canula O2 and fluids.     Since admission, she has been seen by GI for her nausea and vomiting and incidental pancreatic head mass.  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  Of note, an incidental right renal cyst was noted and was assessed as benign by the radiologist.  She underwent an EGD and EUS on 2/26 where esophageal candidiasis was noted and her pancreatic head mass was biopsied. She was started on antifungals and her nausea/vomiting improved as she advanced to a regular diet. She remained admitted due to multifocal pain complaints requiring palliative care consult to assist with a pain regimen.  Her FNA resulted earlier this week as insufficient but suspicious for acinar cell carcinoma, NET or solid pseudopapillary neoplasm. She was seen by medical oncology and underwent an attempted repeat EUS with biopsy today, but the procedure was aborted due to food residue in the stomach. Post-procedurally, the patient remained intubated due to  elevated ETCO2 with hypoventilation, suspected secondary to underlying OSA and possible aspiration event.    Her PSH is significant for remote hysterectomy, lap chole in 2009, and incisional hernia repair in 2009 inferior to umbilicus. Her PMH is otherwise significant for DM, polycythemia, and chronic pain.    Past Medical History:     Past Medical History:   Diagnosis Date    COVID-19     COVID-19 2021    Heart valve problem     Kidney stone     Pneumonia        Past Surgical History:   Past Surgical History[1]    Family History:   Family History[2]    Social History:     Social History     Socioeconomic History    Marital status: Single     Spouse name: Not on file    Number of children: Not on file    Years of education: Not on file    Highest education level: Not on file   Occupational History    Not on file   Tobacco Use    Smoking status: Never     Passive exposure: Current    Smokeless tobacco: Never   Vaping Use    Vaping status: Never Used   Substance and Sexual Activity    Alcohol  use: Not Currently    Drug use: Never    Sexual activity: Not Currently   Other Topics Concern    Not on file   Social History Narrative  Not on file     Social Drivers of Health     Financial Resource Strain: High Risk (03/23/2023)    Overall Financial Resource Strain (CARDIA)     Difficulty of Paying Living Expenses: Very hard   Food Insecurity: No Food Insecurity (03/21/2023)    Hunger Vital Sign     Worried About Running Out of Food in the Last Year: Never true     Ran Out of Food in the Last Year: Never true   Transportation Needs: Unmet Transportation Needs (03/23/2023)    PRAPARE - Therapist, Art (Medical): Yes     Lack of Transportation (Non-Medical): Yes   Physical Activity: Not on file   Stress: Not on file   Social Connections: Not on file   Intimate Partner Violence: Not At Risk (03/21/2023)    Humiliation, Afraid, Rape, and Kick questionnaire     Fear of Current or Ex-Partner: No      Emotionally Abused: No     Physically Abused: No     Sexually Abused: No   Housing Stability: High Risk (03/23/2023)    Housing Stability Vital Sign     Unable to Pay for Housing in the Last Year: Yes     Number of Times Moved in the Last Year: 5     Homeless in the Last Year: No       Allergies:   Allergies[3]    Medications:     Prior to Admission medications    Medication Sig Start Date End Date Taking? Authorizing Provider   acetaminophen  (TYLENOL ) 650 MG CR tablet TAKE 1 TABLET BY MOUTH EVERY 8 HOURS AS NEEDED FOR PAIN  Patient taking differently: every 8 (eight) hours as needed 09/02/21   Leontine Clark, MD   albuterol  sulfate HFA (PROVENTIL ) 108 (90 Base) MCG/ACT inhaler Inhale 1-2 puffs into the lungs every 4 (four) hours as needed 10/06/20   [provider]   benzonatate  (TESSALON ) 200 MG capsule Take 1 capsule (200 mg) by mouth 3 (three) times daily as needed for Cough    [provider]   Budeson-Glycopyrrol-Formoterol  160-9-4.8 MCG/ACT Aerosol Inhale 2 puffs into the lungs 2 (two) times daily 12/09/22   Wilfrid Oneil PARAS, MD   budesonide -formoterol  (SYMBICORT ) 160-4.5 MCG/ACT inhaler Inhale 2 puffs into the lungs 2 (two) times daily as needed    [provider]   cetirizine  (ZyrTEC ) 10 MG tablet Take 10 mg by mouth daily  Patient not taking: Reported on 12/09/2022    [provider]   [employer] Aspirin  Low Dose 81 MG EC tablet Take 1 tablet (81 mg) by mouth daily 07/24/22   [provider]   diphenhydrAMINE -APAP, sleep, (Excedrin PM) 38-500 MG Tab Take by mouth daily    [provider]   famotidine  (PEPCID ) 10 MG tablet Take 10 mg by mouth 2 (two) times daily  Patient not taking: Reported on 12/09/2022    [provider]   metFORMIN (GLUCOPHAGE) 500 MG tablet Take 1 tablet (500 mg) by mouth 2 (two) times daily with meals    [provider]   methocarbamol (ROBAXIN) 750 MG tablet as needed 11/24/22   [provider]   montelukast   (SINGULAIR ) 10 MG tablet Take 1 tablet (10 mg) by mouth nightly 11/07/22   [provider]   naproxen  (EC NAPROSYN ) 500 MG EC tablet Take 1 tablet (500 mg) by mouth 2 (two) times daily with meals  Patient not taking: Reported  on 12/09/2022 03/12/21   Cy Carmel, Earnestine, PA   ondansetron  (ZOFRAN ) 8 MG tablet Take by mouth every 8 (eight) hours as needed for Nausea  Patient not taking: Reported on 12/09/2022    [provider]   pantoprazole  (PROTONIX ) 40 MG tablet TAKE 1 TABLET BY MOUTH EVERY DAY  Patient taking differently: as needed 12/25/20   Leontine Clark, MD   vitamin May , ergocalciferol , (DRISDOL ) 50000 UNIT Cap Take 1 capsule (50,000 Units total) by mouth once a week 11/14/20   Leontine Clark, MD       Review of Systems:   Unable to obtain review of systems as patient is intubated/sedated.    Physical Exam:     Vitals:    04/03/23 0706   BP: 115/68   Pulse: 92   Resp: 19   Temp: 97.5 F (36.4 C)   SpO2: 96%       Body mass index is 35.41 kg/m.    Intake and Output Summary (Last 24 hours) at Date Time    Intake/Output Summary (Last 24 hours) at 04/03/2023 1138  Last data filed at 04/03/2023 1103  Gross per 24 hour   Intake 1419 ml   Output 1651 ml   Net -232 ml       Physical Exam:  General: Obese. Sedated.  HEENT: NCAT, MMM, EOMI, sclera anicteric.  Cardiac: No cyanosis, no edema  Pulmonary: PRVC with FiO2 100% and PEEP 6  Abdomen: Soft, non-distended, prior incisional scars well healed.  No guarding.  Skin: Warm, well-perfused without lesions or rashes.    Labs:     Hematology   Recent Labs     04/03/23  0302 04/01/23  0332   WBC 9.02 8.31   Hemoglobin 13.5 14.2   Hematocrit 42.9 44.7*   Platelet Count 396* 384*      Chemistry   Recent Labs     04/03/23  0302 04/02/23  0415 04/01/23  0332   Sodium 135 135 135   Potassium 4.4 4.0 4.3   Chloride 97* 98* 98*   CO2 31* 27 30*   BUN 7 8 10    Creatinine 0.6 0.7 0.7   Glucose 106* 119* 97   Calcium  9.6 9.4 9.6      Coagulation   No results for  input(s): PT, INR, PTT in the last 72 hours.   Liver Function Tests   Recent Labs     04/01/23  0332   AST (SGOT) 64*   ALT 104*   Alkaline Phosphatase 199*   Protein, Total 7.2   Albumin 3.6   Bilirubin, Total 0.3        Rads:   Radiological Procedure reviewed.  No results found.    Problem List:   Problem List[4]    Patient has a BMI of   kg/m2    Diagnosis: Class 2 Obesity: BMI of 35 to 39.9          Assessment:   52 y.o. female presents with N/V and SOB, and incidentally found to have pancreatic head mass s/p EUS/FNA with insufficient tissue but suspicious for acinar cell neoplasm, NET or solid pseudopapillary neoplasm. N/V since improved with treatment of esophageal candidiasis seen on EGD.  Repeat biopsy attempted today but aborted due to significant food in stomach.  Procedure complicated by hypercapnic hypoxia preventing extubation, likely secondary to underlying OSA +/- aspiration event.  Upgraded to CICU.    Plan:   - Repeat EUS with biopsy when patient is medically  stable for procedure  - Appreciate medical oncology input. CTA chest on admission without signs of metastases. CA 19-9 and chromogranin A pending.  - Further plans pending results of above.  - Rest of care per primary team.    Plan discussed with Dr. Elinor who agrees with plan.  Please call [phone] with any questions/concerns.    Damien Null, PA-C  Surgical Oncology/Hepatobiliary Surgery  Spectralink [phone]          [1]   Past Surgical History:  Procedure Laterality Date    CYST REMOVAL      ESOPHAGOGASTRODUODENOSCOPY (EGD), ESOPHAGEAL ULTRASOUND (EUS) WITH FINE NEEDLE ASPIRATION/BIOPSY OF ESOPHAGUS, STOMACH, OR DUODENUM N/A 03/25/2023    Procedure: ESOPHAGOGASTRODUODENOSCOPY (EGD), ESOPHAGEAL ULTRASOUND (EUS) WITH FINE NEEDLE ASPIRATION/BIOPSY OF ESOPHAGUS, STOMACH, OR DUODENUM;  Surgeon: Abagail Carole HERO, MD;  Location: [id number];  Service: Gastroenterology;  Laterality: N/A;    GALLBLADDER SURGERY      HERNIA REPAIR      HYSTERECTOMY       TUBAL LIGATION     [2]   Family History  Problem Relation Name Age of Onset    Heart attack Mother      Diabetes Father      Heart attack Maternal Grandfather      Heart attack Paternal Grandfather      Stroke Son      Heart attack Maternal Uncle      Stomach cancer Cousin      Bone cancer Maternal Aunt      Colon cancer Neg Hx      Breast cancer Neg Hx     [3]   Allergies  Allergen Reactions    Benadryl  [Diphenhydramine ]     Dust Mite Extract     Methocarbamol Itching    Molds & Smuts     Other     Compazine [Prochlorperazine] Anxiety   [4]   Patient Active Problem List  Diagnosis    GERD (gastroesophageal reflux disease)    Hypoxemia    Intractable chronic migraine without aura    Lumbar spondylosis    Obstructive sleep apnea (adult) (pediatric)    Unspecified astigmatism, bilateral    Uterine leiomyoma    Polycythemia    Vitamin May  deficiency    Ischemic cerebrovascular accident (CVA)    Severe persistent asthma without complication    Eosinophilia    Bronchiectasis without complication    Personal history of COVID-19    Personal history of pneumonia (recurrent)    Abnormal chest CT    Chronic cough    Generalized abdominal pain    Pancreatic mass    Nausea & vomiting

## 2023-04-03 NOTE — Anesthesia Preprocedure Evaluation (Addendum)
 Anesthesia Evaluation    AIRWAY    Mallampati: IV    TM distance: >3 FB  Neck ROM: full  Mouth Opening:full   CARDIOVASCULAR    regular       DENTAL    no notable dental hx               PULMONARY    clear to auscultation     OTHER FINDINGS                                        Relevant Problems   ANESTHESIA   (+) Obstructive sleep apnea (adult) (pediatric)      PULMONARY   (+) Obstructive sleep apnea (adult) (pediatric)      NEURO/PSYCH   (+) Intractable chronic migraine without aura   (+) Ischemic cerebrovascular accident (CVA)      CARDIO   (+) Intractable chronic migraine without aura      GI   (+) GERD (gastroesophageal reflux disease)       PSS Anesthesia Comments: Still having nausea and vomiting  No problems with anesthesia  No family history of anesthesia problems  + OSA          Anesthesia Plan    ASA 3     general                     intravenous induction   Detailed anesthesia plan: general endotracheal        Post op pain management: per surgeon    informed consent obtained      pertinent labs reviewed               Signed by: Lamar Hedger, MD 04/03/23 7:24 AM

## 2023-04-03 NOTE — Plan of Care (Signed)
 Problem: Pain interferes with ability to perform ADL  Goal: Pain at adequate level as identified by patient  Outcome: Progressing  Flowsheets (Taken 04/03/2023 2000)  Pain at adequate level as identified by patient:   Identify patient comfort function goal   Assess for risk of opioid induced respiratory depression, including snoring/sleep apnea. Alert healthcare team of risk factors identified.   Assess pain on admission, during daily assessment and/or before any as needed intervention(s)   Reassess pain within 30-60 minutes of any procedure/intervention, per Pain Assessment, Intervention, Reassessment (AIR) Cycle   Evaluate if patient comfort function goal is met   Evaluate patient's satisfaction with pain management progress   Offer non-pharmacological pain management interventions     Problem: Side Effects from Pain Analgesia  Goal: Patient will experience minimal side effects of analgesic therapy  Outcome: Progressing  Flowsheets (Taken 04/03/2023 2000)  Patient will experience minimal side effects of analgesic therapy:   Monitor/assess patient's respiratory status (RR depth, effort, breath sounds)   Assess for changes in cognitive function   Prevent/manage side effects per LIP orders (i.e. nausea, vomiting, pruritus, constipation, urinary retention, etc.)   Evaluate for opioid-induced sedation with appropriate assessment tool (i.e. POSS)     Problem: Everyday - Heart Failure  Goal: Stable Vital Signs and Fluid Balance  Outcome: Progressing  Flowsheets (Taken 04/03/2023 2100)  Stable Vital Signs and Fluid Balance:   Daily Standing Weights in the morning using the same scale, after using the bathroom and before breadfast.  If unable to stand, zero the bed and use the bed scale   Monitor, assess vital signs and telemetry per policy   Monitor labs and report abnormalities to physician   Fluid Restriction   Strict Intake/Output   Assess for swelling/edema   Wean oxygen as needed if appropriate  Goal: Nutritional  Intake is Adequate  Outcome: Progressing  Flowsheets (Taken 04/03/2023 2100)  Nutritional Intake is Adequate:   Fluid Restricction if needed   Patient and family teaching on low sodium diet   Encourage/perform oral hygiene as appropriate   Assess appetite,anorexia and amount of meal/food tolerated   Consult/Collaborate with Nutritionist     Problem: Compromised Activity/Mobility  Goal: Activity/Mobility Interventions  Outcome: Progressing  Flowsheets (Taken 04/03/2023 2100)  Activity/Mobility Interventions: Pad bony prominences, TAP Seated positioning system when OOB, Promote PMP, Reposition q 2 hrs / turn clock, Offload heels     Problem: Compromised Sensory Perception  Goal: Sensory Perception Interventions  Outcome: Progressing  Flowsheets (Taken 04/03/2023 2100)  Sensory Perception Interventions: Offload heels, Pad bony prominences, Reposition q 2hrs/turn Clock, Q2 hour skin assessment under devices if present     Problem: Compromised Moisture  Goal: Moisture level Interventions  Outcome: Progressing  Flowsheets (Taken 04/03/2023 2100)  Moisture level Interventions: Moisture wicking products, Moisture barrier cream     Problem: Compromised Nutrition  Goal: Nutrition Interventions  Outcome: Progressing  Flowsheets (Taken 04/03/2023 2100)  Nutrition Interventions: Discuss nutrition at Rounds, I&Os, Document % meal eaten, Daily weights     Problem: Compromised Friction/Shear  Goal: Friction and Shear Interventions  Outcome: Progressing  Flowsheets (Taken 04/03/2023 2100)  Friction and Shear Interventions: Pad bony prominences, Off load heels, HOB 30 degrees or less unless contraindicated, Consider: TAP seated positioning, Heel foams     Problem: Moderate/High Fall Risk Score >5  Goal: Patient will remain free of falls  Outcome: Progressing  Flowsheets (Taken 04/03/2023 2000)  Moderate Risk (6-13):   LOW-Fall Interventions Appropriate for Low Fall Risk  LOW-Anticoagulation education for injury risk   MOD-Consider activation of  bed alarm if appropriate   MOD-Remain with patient during toileting   MOD-Perform dangle, stand, walk (DSW) prior to mobilization     Problem: Pain  Goal: Pain at adequate level as identified by patient  Outcome: Progressing  Flowsheets (Taken 04/03/2023 2000)  Pain at adequate level as identified by patient:   Identify patient comfort function goal   Assess for risk of opioid induced respiratory depression, including snoring/sleep apnea. Alert healthcare team of risk factors identified.   Assess pain on admission, during daily assessment and/or before any as needed intervention(s)   Reassess pain within 30-60 minutes of any procedure/intervention, per Pain Assessment, Intervention, Reassessment (AIR) Cycle   Evaluate if patient comfort function goal is met   Evaluate patient's satisfaction with pain management progress   Offer non-pharmacological pain management interventions

## 2023-04-03 NOTE — Plan of Care (Signed)
 Nursing Progress Note - Shift Summary and Care Plan    Shift Summary:   Throughout shift pt complaining of headache, abd pain, sore throat, and general pain. Dilauid PO and IV given. Every time pt calls she cries of pain and always states pain is 10/10. When going in room pt is found sleeping and without NC. Fx hospitalist was notified.      Critical Events:   None    LDAs & Indication:  Patient Lines/Drains/Airways Status       Active Lines, Drains and Airways       Name Placement date Placement time Site Days    Peripheral IV 04/02/23 20 G Standard Right Forearm 04/02/23  2127  Forearm  less than 1                     Systems Overview:  Neuro:   A&Ox4, drowsy   Resp:   On NC 2L   Cardiac:   HR regular  GI/GU:   LBM: 3/6 (x1 during shift)  Voiding freely   0.9 mL/kg/hr (24hr)   Nutrition:   NPO since midnight   +N/+V (x1 per pt, only witnessed around 5 am pt placed index finger in mouth attempting to make herself throw up but nothing came out)  AC/HS  Activity/Safety/Mobility:   Patient is independent   Low FALLSRISK   PT/OT following  Skin/Wounds:    Scattered bruising/tattoos   Treatments/Wound Care  VTE Prophylaxis:   Medication VTE Prophylaxis Orders: enoxaparin  (LOVENOX ) syringe 40 mg  Mechanical VTE Prophylaxis Orders: Maintain sequential compression device (pt refuses)    Upcoming Tests/Procedures  EUS w/Bx in am     Discharge Plan:  Apr 06, 2023   Pending biopsy results    Care Plan     Problem: Psychosocial and Spiritual Needs  Goal: Demonstrates ability to cope with hospitalization/illness  Outcome: Progressing     Problem: Discharge Barriers  Goal: Patient will be discharged home or other facility with appropriate resources  Outcome: Progressing     Problem: Pain  Goal: Pain at adequate level as identified by patient  Outcome: Progressing     Problem: Safety  Goal: Patient will be free from injury during hospitalization  Outcome: Progressing  Goal: Patient will be free from infection during  hospitalization  Outcome: Progressing     Problem: Moderate/High Fall Risk Score >5  Goal: Patient will remain free of falls  Outcome: Progressing     Problem: Compromised Friction/Shear  Goal: Friction and Shear Interventions  Outcome: Progressing

## 2023-04-03 NOTE — Progress Notes (Signed)
 04/03/23 1206   Extubation   Extubation reason Ordered - No protocol   Extubated to BiLevel   Adverse Reactions None

## 2023-04-03 NOTE — Progress Notes (Signed)
 Quick Doc  [facility] [place] [facility] 3RD FLOOR   Patient Name: [facility] D   Attending Physician: Fernand Abram SAUNDERS, MD   Today's date:   04/03/2023 LOS: 12 days   Expected Discharge Date      Quick  Assessment:                                                              ReAdmit Risk Score: 12.37    CM Comments: 3/6: esophageal candidiasis & pancreatic mass, repeat bx pending.  DISPO: home w/family & friends-  MCD approved    Discussed in MDR this morning.  Attempted repeat EGD/EUS/biopsy due to retained food.  Will need to reschedule in 3 or more days.                                                                                   Provider Notifications:

## 2023-04-03 NOTE — Anesthesia Postprocedure Evaluation (Signed)
 Anesthesia Post Evaluation    Patient: Valerie May    ESOPHAGOGASTRODUODENOSCOPY (EGD), ESOPHAGEAL ULTRASOUND (EUS) WITH FINE NEEDLE ASPIRATION/BIOPSY OF ESOPHAGUS, STOMACH, OR DUODENUM    Anesthesia type: general    Last Vitals:   Vitals Value Taken Time   BP 152/69 04/03/23 1130   Temp 36.2 C (97.2 F) 04/03/23 1130   Pulse 101 04/03/23 1130   Resp 26 04/03/23 1130   SpO2 99 % 04/03/23 1146                 Anesthesia Post Evaluation:             Multimodal analgesia pain management approach  Strategies: local anesthesia    Two or more mitigation strategies used for obstructive sleep apnea.      Anesthetic complications: No          Cardiovascular status: acceptable  Respiratory status: acceptable            Signed by: Lamar Hedger, MD, 04/03/2023 12:07 PM

## 2023-04-03 NOTE — Progress Notes (Signed)
 MEDICINE PROGRESS NOTE    Date Time: 04/03/23 9:21 AM  Patient Name: Valerie May  Attending Physician: Erick Larve, DO    Hospital Course/Interim Summary:   Valerie May is a 53 y.o. female with a history of asthma, eosinophilia, mild bronchiectasis, recurrent pneumonia/bronchitis, migraine headaches, prolonged admit in [place]  for COVID-19 in 2021, sinus disease, GERD, DM2 who presented to [facility] 03/21/2023 with intractable nausea and vomiting, dyspnea, chest abdominal pain, acute on chronic pain in right arm, headache.  Early course complicated by acute respiratory acidosis felt to be due to IV opiates.  An MRI showed concern for pancreatic cancer.  She underwent EGD/EUS 2/26. Biopsy of pancreatic lesion is pending. Esophageal brushing confirmed esophageal candidiasis and she has been on a course of fluconazole . She continues to have persistent migratory pains and nausea and we are attempting to wean IV medications so that she can discharge.    Assessment:     Pancreatic lesion, concern for neoplasia  Intractable nausea, vomiting  Generalized diffuse pain, headache, abdominal pain, chest pain  Esophageal candidiasis   Acute metabolic encephalopathy, resolved  Transaminitis, presumably DILI related to fluconazole , though resolved and fluconazole  continued so unclear etiology  Acute hypercapnic and hypoxic respiratory failure  Asthma with bronchiectasis  History of migraines  Mild transaminitis, resolved  Noninfectious SIRS  GERD  NIDDM  Right renal lesion  MDD/GAD  Class II obesity based on BMI calculation    Subjective     CC: Generalized abdominal pain    Interval History/24 hour events/HPI/Subjective:     Patient for biopsy today - per GI noted entire stomach, duodenum was full of solid food stuff likely from Gastroparesis vs narcotic induced. Was unable to be extubated andsent to ICU from GE suite.    Plan:   EGD/EUS 2/26 demonstrated likely esophageal candidiasis - confirmed on brushing.  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 Unfortunately, all the cellularity is in the smears made on-site and there is nothing abnormal in the cell block for staining to further characterize tumor type.  Repeat cellblock from remnant supernate is also acellular.    This case is peer-reviewed intradepartmentally, with concurrence.    Pathology might need more sample - discussed with GI - they'll review -based on this c/s Oncology  Recommending re-biopsy and Surgical Onc c/s - requested  3/7: re-biopsy with GI   Planned EUS was attempted but could not be completed due to large amount of solid food. GI was planning to repeat Monday or Tuesday with 2 days of clear liquid diet but unfortunately patient could not be extubated and transferred to ICU    Palliative c/s for pain management - appreciate input -recommending PO over IV as patient is wanting only IV due to nausea  3/6: Adjusting meds as below:  [place] oxycodone , pt refusing  Rotate morphine  in setting of refractory nausea and concern for opioid related nausea  Dilaudid  2mg  PO Q 6 hour ATC  Dilaudid  2mg  PO Q 3 hour PRN pain  Dilaudid  0.6mg  IV Q 4 hour PRN breakthrough pain.     3/6: Fioricet  for HA, triptan x1, if still with HA  can try migraine cocktail and might need head CT  Moving all extremities fine     Outpatient follow-up for right renal lesion  Bowel regimen    Continue venlafaxine  for major depression, short course benzo for acute anxiety until SNRI kicks in    Safety Checklist:   DVT Prophylaxis: Lovenox   Nutrition: Regular diet  Code Status: Full code    Review of Systems:     Negative except  per HPI    Physical Exam:     VITAL SIGNS PHYSICAL EXAM   Temp:  [97.5 F (36.4 C)-99 F (37.2 C)] 97.5 F (36.4 C)  Heart Rate:  [91-98] 92  Resp Rate:  [16-19] 19  BP: (98-120)/(62-77) 115/68        Intake/Output Summary (Last 24 hours) at 04/03/2023 9078  Last data filed at 04/03/2023 0300  Gross per 24 hour   Intake 900 ml   Output 1650 ml   Net -750 ml    Physical Exam  General: awake, alert, oriented x 3;anxious  HEENT: perrla, eomi, sclera anicteric oropharynx clear without lesions, mucous membranes moist, upper lip now normal  Neck: supple, no LAD, no thyromegaly, no JVD, no carotid bruits  Cardiovascular: regular rate and rhythm, no murmurs, rubs, or gallops  Lungs: clear to auscultation bilaterally, without wheezing, rhonchi, or rales  Abdomen: soft, mildly tender, non-distended; no palpable masses, no hepatosplenomegaly, normoactive bowel sounds, no rebound or guarding  Extremities: no clubbing, cyanosis, or edema  Neuro: cranial nerves grossly intact, strength 5/5 in upper and lower extremities, sensation intact  Skin: no rashes or lesions noted         Meds:     Medications were reviewed:    Labs:     Labs (last 72 hours):    Recent Labs   Lab 04/03/23  0302 04/01/23  0332   WBC 9.02 8.31   Hemoglobin 13.5 14.2   Hematocrit 42.9 44.7*   Platelet Count 396* 384*                 Microbiology, reviewed and are significant for:  Microbiology Results (last 15 days)       Procedure Component Value Units Date/Time    Respiratory Pathogen Panel With COVID-19, PCR [[id number]]  (Normal) Collected: 04/02/23 1017    Order Status: Completed Specimen: Nasopharyngeal Swab from Nasopharynx Updated: 04/02/23 1855     Adenovirus DNA Not Detected     Coronavirus HKU1 RNA Not Detected     Coronavirus NL63 RNA Not Detected     Coronavirus 229E RNA Not Detected     Coronavirus OC43 RNA Not Detected     Human Metapneumovirus RNA Not Detected     Human Rhinovirus/Enterovirus RNA Not Detected     Influenza A RNA Not Detected      Influenza B RNA Not Detected     Parainfluenza Virus 1 RNA Not Detected     Parainfluenza Virus 2 RNA Not Detected     Parainfluenza Virus 3 RNA Not Detected     Parainfluenza Virus 4 RNA Not Detected     Respiratory Syncytial Virus RNA Not Detected     Bordetella pertussis DNA Not Detected     Bordetella parapertussis DNA Not Detected     Chlamydophila pneumoniae DNA Not Detected     Mycoplasma pneumoniae DNA Not Detected     SARS-CoV-2 (COVID-19) RNA Not Detected    Narrative:      Testing performed using the [employer] FilmArray Respiratory Panel (RP  2.1).  This test is for the qualitative detection of respiratory pathogen nucleic acid. This assay cannot differentiate between Rhinovirus/Enterovirus. If necessary for patient care, a positive result for Rhinovirus/Enterovirus may be followed-up using an alternate method. This assay should not be used if B. pertussis infection is specifically suspected as it is less sensitive than other alternatives. If suspected, ensure that B. pertussis specific testing is ordered. Recent administration of a nasal influenza vaccine may cause false positive results for Influenza A and/or Influenza B.    Viral and bacterial nucleic acids may persist even though no viable organism is present. Detection of nucleic acid does not imply that the corresponding organisms are infectious or are the causative agents of clinical symptoms. Positive results of this test do not rule out coinfection with other organisms. A negative result does not exclude the possibility of viral or bacterial infection.  Assay performance characteristics may vary with circulating strains and this assay may not be able to distinguish between existing viral strains and new variants as they emerge. Results of this test should not be used as the sole basis for diagnosis, treatment, or other patient management decisions.    This assay is FDA authorized for nasopharyngeal swab samples.  Performance characteristics for  Bronchoalveolar lavage samples have been determined by the [facility] laboratory. Other sample types are unacceptable.     Invalid results may be due to inhibiting substances in the specimen and recollection should occur.       Culture, Sputum and Lower Respiratory [[id number]] Collected: 04/02/23 0847    Order Status: Completed Specimen: Sputum, Expectorated Updated: 04/03/23 9177     Culture Respiratory Light growth of Mixed upper respiratory flora     Gram Stain Many WBCs      Few Squamous epithelial cells      Many Mixed respiratory flora    Respiratory Pathogen Panel With COVID-19, PCR [[id number]]     Order Status: Sent Specimen: Nasopharyngeal Swab from Nasopharynx     COVID-19 and Influenza ([place]) (symptomatic) [[id number]]  (Normal) Collected: 03/21/23 0532    Order Status: Completed Specimen: Swab from Anterior Nares Updated: 03/21/23 0646     SARS-CoV-2 (COVID-19) RNA Not Detected     Influenza A RNA Not Detected     Influenza B RNA Not Detected    Narrative:      A result of Detected indicates POSITIVE for the presence of viral RNA  A result of Not Detected indicates NEGATIVE for the presence of viral RNA    Test performed using the Roche cobas [place] SARS-CoV-2 & Influenza A/B assay. This is a multiplex real-time RT-PCR assay for the detection of SARS-CoV-2, influenza A, and influenza B virus RNA. Viral nucleic acids may persist in vivo, independent of viability. Detection of viral nucleic acid does not imply the presence of infectious virus, or that virus nucleic acid is the cause of clinical symptoms. Negative results do not preclude SARS-CoV-2, influenza A, and/or influenza B infection and should not be used as the sole basis for diagnosis, treatment or other patient management decisions. Invalid results may be due to inhibiting substances in the specimen and recollection should occur.     Culture, Blood, Aerobic And Anaerobic [[id number]] Collected: 03/21/23 0506    Order Status: Completed Specimen:  Blood, Venous Updated: 03/26/23 0900     Culture Blood No growth at 5 days              Imaging, reviewed and are significant for:  XR Chest AP  Portable    Result Date: 03/28/2023   No acute cardiopulmonary disease. Sharon May'Heureux, MD 03/28/2023 10:21 AM    Recent Labs   Lab 04/03/23  0302 04/02/23  0415 04/01/23  0332 03/31/23  0359   Sodium 135 135 135 136   Potassium 4.4 4.0 4.3 4.2   Chloride 97* 98* 98* 97*   CO2 31* 27 30* 31*   BUN 7 8 10 11    Creatinine 0.6 0.7 0.7 0.7   Calcium  9.6 9.4 9.6 9.5   Albumin  --   --  3.6 3.8   Protein, Total  --   --  7.2 7.5   Bilirubin, Total  --   --  0.3 0.3   Alkaline Phosphatase  --   --  199* 204*   ALT  --   --  104* 85*   AST (SGOT)  --   --  64* 54*   Glucose 106* 119* 97 120*        Signed by: Reese Netter, DO

## 2023-04-03 NOTE — Progress Notes (Addendum)
 [facility] Palliative Medicine & Comprehensive Care        Palliative Care Progress Note   Date Time: 04/03/23 1:39 PM   Patient Name: Valerie May   Location: FI300/FI300.01   Attending Physician: Barb Cower, Sula SAILOR,*   Primary Care Physician: Michiel Olam RAMAN, FNP   Consulting Provider: Elveria Duncans, NP   Consulting Service: Palliative Medicine and Comprehensive Care  Consult request from [facility], Sula SAILOR,* to see patient regarding:   Reason for Referral: Pain          Assessment & Plan   Impression   Valerie May 53 y.o. female with PMHx asthma, eosinophilia, mild bronchiectasis, recurrent pneumonia/bronchitis, migraine headaches, prolonged admit in [place]  for COVID-19 in 2021, sinus disease, GERD, DM2. 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 S/p EGD/EUS 2/26. FNA w/ indeterminate neoplasm. S/p repeat biopsy today- unsuccessful. To ICU for extubation.  Palliative Care consult for pain.            Recommendations   1. Goals of Care:   Palliative Care is available to participate in family meetings, please page/epic chat/ call to arrange.   Oncology following, plan is for repeat biopsy of neoplasm  Palliative care consulted for pain/symptoms, see below.       ACP Validation: Not addressed  Surrogate Decision Maker: Next of Kin status: children:   ACP Document: None    CODE STATUS: Full Code     2.  Pain:   - Central nociceptive abdominal pain- constant, worse with eating. Per oncology note, the small enhancing lesion she has in the head of pancreas is unlikely the cause of her symptoms  Migraines, Chronic MSK pain; RUE/RLE pain chronic, injury related- not managed by palliative care  - Trial non Pharm interventions as indicated (I.e. hot/cold packs; repositioning; activity; spiritual care; relaxation techniques)  Tylenol  PRN mild pain  [place] oxycodone , pt refusing. Rotated morphine  in setting of refractory nausea and concern for opioid related nausea  Dilaudid  2mg  PO Q 3 hour  PRN pain  Dilaudid  0.6mg  IV Q 4 hour PRN breakthrough pain.     Pt exhibits signs of total pain. Palliative Care chaplain, therapist are following. Ativan  PRN available as well.   Pt with various pain complaints and patterns of requesting only IV opioids as well as specific IV opioids. Explained at length  rationale for above regimen as well as recommendation to utilize oral opioids for longer duration of effect in comparison to IV. Attempting to transition to regimen that can be utilized on an outpatient basis.   Will follow along in event that this pain could be malignancy related. However, if pain is moreso migraine/MSK pain then would defer to primary team.  These medication requires intensive monitoring for drug toxicity and will be monitored for ADE e.g. over somnolence, constipation, respiratory depression.         3. Non-Pain Symptoms:   Constipation:   Pericolace 2 tab PO qhs  bisacodyl  to prn  Cont Miralax      Nausea/Vomiting:  Hx jitteryness with compazine  Cont scop patch   Cont reglan  QID  Zofran  4mg  IV/PO Q 6 hour PRN nausea- note HA side effect. Discontinue if HA's appear associated w zofran   Staff witnessed episode of pt attempting self induced vomiting     Depression/Anxiety:  Continue home regimen (venlafaxine )   Continue Ativan  0.25mg  PO Q 6 hour PRN anxiety  Incorporate non-pharmacological treatment of anxiety when appropriate including emotional support, distraction strategies, and  relaxation exercises.          4. Psychosocial: Palliative Care Therapist is following  Pt is single, 4 adult children  Her youngest son lives with her and has needs related to bipolar disorder dx    Case Management would be beneficial to follow along as pt has housing and financial concerns.      5. Spiritual: Catholic. Palliative Care Chaplain is following  6. PC Team follow-up plans: Monday      Discharge Disposition: TBD      Outpatient Follow Up Recommended: Yes    Outcomes: Improved pain intervention and  Improved non-pain symptom therapy    Counseling & coordination of care with Patient, referring provider, and Bedside Nurse with recommendations above.         Elveria Duncans, MSN, AG-ACNP-BC, ANP-BC, [facility]  Palliative Medicine and Comprehensive Care  Spectra : [phone] M-F,  8a-4:30p  Extend Pager: [phone],    8a-4:30p  Team Phone Number:  [phone], M-F 8a-4:30P  Team Xtend Pager: [phone], 8a-4:30p, Daily     Interval History   Valerie May is a 53 y.o. female admitted to hospital on 03/21/2023 with Generalized abdominal pain [R10.84]     A 10 point ROS was completed and is negative except for as documented     Past Family/Social History reviewed from H+P and unchanged    MAR review: 65 OME    Note: OMEs calculated using updated opioid conversion table Honor ML. Demystifying Opioid Conversion Calculations, A Guide for Effective Dosing, 2nd Edition. ASHP; 2018.)    Pt awake in bed. Recently extubated. Drowsy, gestures to head- nursing aware of HA pain.        Goals of Care   CURRENT CPR Status: Full Code          [place]  Health Care Decisions Act: Procedure in absence of an advance directive (54.01-2984)  1. A guardian for the patient. This subdivision shall not be construed to require such appointment in order that a health care decision can be made under this section; or  2. The patient's spouse except where a divorce action has been filed and the divorce is not final; or  3. An adult child of the patient; or  4. A parent of the patient; or  5. An adult brother or sister of the patient; or  6. Any other relative of the patient in the descending order of blood relationship; or  7. Except in cases in which the proposed treatment recommendation involves the withholding or withdrawing of a life-prolonging procedure, any adult, except any interior and spatial designer, employee, or agent of a health care provider currently involved in the care of the patient, who (i) has exhibited special care and concern for the patient and (ii) is  familiar with the patient's religious beliefs and basic values and any preferences previously expressed by the patient regarding health care, to the extent that they are known.  Source: https://law.[place] .gov/vacode/title54.1/chapter29/section54.01-2984/        Palliative Functional and Symptom Assessment     Palliative Performance Scale: 70% - Reduced ambulation, unable to do normal work, some evidence of disease, full self-care, normal or reduced intake, full LOC    [place] Symptom Assessment Scale (ESAS): Completed       Medications   Scheduled Meds  Current Facility-Administered Medications   Medication Dose Route Frequency    aluminum & magnesium  / diphenhydrAMINE  / lidocaine   15 mL Oral QID    aspirin  EC  81 mg Oral Daily  budesonide -formoterol   2 puff Inhalation BID    cetirizine   10 mg Oral Daily    [Held by provider] enoxaparin   40 mg Subcutaneous Daily    fluconazole   400 mg Oral Q24H SCH    fluocinonide    Topical BID    insulin  lispro  1-4 Units Subcutaneous QHS    insulin  lispro  1-8 Units Subcutaneous TID AC    metoclopramide   5 mg Oral QID    montelukast   10 mg Oral QHS    pantoprazole   40 mg Oral Q12H SCH    polycarbophil  625 mg Oral BID    polyethylene glycol  17 g Oral Daily    senna-docusate  2 tablet Oral QHS    venlafaxine   25 mg Oral TID MEALS      DRIPS     PRN MEDS  PRN Medications[1]    Allergies   Allergies[2]    Physical Exam   BP 96/53   Pulse 95   Temp 97.4 F (36.3 C) (Temporal)   Resp 22   Ht 1.499 m (4' 11)   Wt 79.5 kg (175 lb 4.3 oz)   LMP 03/18/2012   SpO2 97%   BMI 35.40 kg/m    Physical Exam:  General: woman in bed in  no acute distress   HEENT:  EOMI, sclera anicteric, OP/OC clear- oxymask on  Neck: supple, FROM   CV: regular rate and rhythm  Lungs:unlabored, no accessory muscle use  Abd: soft, NT, ND, +bs  Ext: no clubbing, cyanosis  Neuro: awake, alert, drowsy  Psych:  unable to assess  Skin: no rashes or lesions noted     Labs / Radiology   Lab and  diagnostics: reviewed in Epic  Recent Labs   Lab 04/03/23  0302   WBC 9.02   Hemoglobin 13.5   Hematocrit 42.9   Platelet Count 396*              Recent Labs   Lab 04/03/23  0302   Sodium 135   Potassium 4.4   Chloride 97*   CO2 31*   BUN 7   Creatinine 0.6   GFR >60.0   Glucose 106*   Calcium  9.6     Recent Labs   Lab 04/01/23  0332 03/30/23  0430 03/28/23  1318   Bilirubin, Total 0.3  More results in Results Review 0.5   Bilirubin Direct  --   --  0.1   Protein, Total 7.2  More results in Results Review 7.1   Albumin 3.6  More results in Results Review 3.9   ALT 104*  More results in Results Review 68*   AST (SGOT) 64*  More results in Results Review 28   More results in Results Review = values in this interval not displayed.          XR Chest AP Portable    Result Date: 04/03/2023  1. Endotracheal tube in good position. 2. Mild hypoinflation with central vascular prominence and peribronchial thickening. 3. Remainder as above. Wolm Luis, MD 04/03/2023 12:08 PM    XR Chest AP Portable    Result Date: 03/28/2023   No acute cardiopulmonary disease. Sharon May'Heureux, MD 03/28/2023 10:21 AM                  [1]   Current Facility-Administered Medications   Medication Dose    acetaminophen   650 mg    albuterol   2.5 mg    alum & mag hydroxide-simethicone   30 mL    bengay greaseless      benzocaine -menthol   1 lozenge    benzonatate   200 mg    bisacodyl   10 mg    butalbital -acetaminophen -caffeine   1 tablet    carboxymethylcellulose sodium  1 drop    dextrose   15 g of glucose    Or    dextrose   12.5 g    Or    dextrose   12.5 g    Or    glucagon  (rDNA)  1 mg    HYDROmorphone   0.6 mg    HYDROmorphone   2 mg    ketorolac   30 mg    lidocaine  viscous  5 mL    Lubrifresh PM      magnesium  sulfate  1 g    naloxone   0.2 mg    ondansetron   8 mg    Or    ondansetron   4 mg    potassium & sodium phosphates   2 packet    potassium chloride   0-60 mEq    Or    potassium chloride   0-60 mEq    Or    potassium chloride   10 mEq    saline  2 spray    [2]   Allergies  Allergen Reactions    Benadryl  [Diphenhydramine ]     Dust Mite Extract     Methocarbamol Itching    Molds & Smuts     Other     Compazine [Prochlorperazine] Anxiety

## 2023-04-03 NOTE — H&P (Addendum)
 [facility] ([facility])  History and Physical      Patient Name: Valerie May  MRN: [id number]  Room: FI300/FI300.01  Code Status: Full Code      Assessment & Plan   MSICU Attending Assessment/Plan: See below                  Chief Complaint / Primary Reason for MSICU Evaluation   Acute hypercarbic respiratory failure    History of Presenting Illness   Valerie May is a 53 y.o. female w/ PMHx asthma, eosinophilia, bronchiectasis, recurrent PNA, GERD, DM2, polycythemia, obesity. Admitted to hospital on 2/22 w/ N&V, abdominal pain and altered mental status. Noted to be hypercarbic on VBG, [facility] consulted, recommended to start patient on nocturnal CPAP for OHS and avoid narcotics. 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 GI consulted. EGD / EUS w/ fine needle biopsy performed on 2/26. During this procedure te patient was extubated to CPAP w/ NPA. 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 Oncology recommended to repeat biopsy. Today patient was taken for repeat EGD for EUS w/ biopsy but could not be completed due to large amount of food in the stomach. Anesthesia was unable to extubate her due to low Vt and high ETCO2. Called [facility] for admission.    Subjective   Past Medical History:     Past Medical History:   Diagnosis Date    COVID-19     COVID-19 2021    Heart valve problem     Kidney stone     Pneumonia        Past Surgical History:   Past Surgical History[1]    Family History:   Family History[2]    Social History:     Social History     Socioeconomic History    Marital status: Single     Spouse name: Not on file    Number of children: Not on file    Years of education: Not on file    Highest education level: Not on file   Occupational History    Not on file   Tobacco Use    Smoking status: Never     Passive exposure: Current    Smokeless tobacco: Never   Vaping Use    Vaping status: Never Used   Substance and Sexual Activity     Alcohol  use: Not Currently    Drug use: Never    Sexual activity: Not Currently   Other Topics Concern    Not on file   Social History Narrative    Not on file     Social Drivers of Health     Financial Resource Strain: High Risk (03/23/2023)    Overall Financial Resource Strain (CARDIA)     Difficulty of Paying Living Expenses: Very hard   Food Insecurity: No Food Insecurity (03/21/2023)    Hunger Vital Sign     Worried About Running Out of Food in the Last Year: Never true     Ran Out of Food in the Last Year: Never true   Transportation Needs: Unmet Transportation Needs (03/23/2023)    PRAPARE - Therapist, Art (Medical): Yes     Lack of Transportation (Non-Medical): Yes   Physical Activity: Not on file   Stress: Not on file   Social Connections: Not on file   Intimate Partner Violence: Not At Risk (03/21/2023)    Humiliation, Afraid, Rape, and Kick  questionnaire     Fear of Current or Ex-Partner: No     Emotionally Abused: No     Physically Abused: No     Sexually Abused: No   Housing Stability: High Risk (03/23/2023)    Housing Stability Vital Sign     Unable to Pay for Housing in the Last Year: Yes     Number of Times Moved in the Last Year: 5     Homeless in the Last Year: No       Allergies:   Allergies[3]       Objective   Physical Examination   Vitals Temp:  [97.5 F (36.4 C)-99 F (37.2 C)]   Heart Rate:  [91-98]   Resp Rate:  [16-19]   BP: (98-120)/(62-77)   SpO2:  [90 %-96 %]   Weight:  [79.5 kg (175 lb 4.8 oz)]   BMI (calculated):  [35.4]  // Temperature with 24 range  Vent Settings PEEP/EPAP:  [0 cm H20-5 cm H20] 5 cm H20  Physical Exam    Neuro:    Intubated, sedated, able to follow commands, moving all extremities, having purposeful movement w/ arms    Lungs:   Clear but diminished breath sounds, no wheezing, rales or rhonchi    Cardiac:   normal rate, regular rhythm, normal S1, S2, no murmurs, rubs, clicks or gallops     Abdomen:    soft, nontender, nondistended, no masses  or organomegaly  bowel sounds hypoactive     Extremities:   peripheral pulses normal no pedal edema    Skin:     Warm, dry and intact        Assessment   Valerie May is a 53 y.o. female who presents with acute hypercarbic respiratory failure.        Patient has a BMI of   kg/m2    Diagnosis: No additional diagnosis                      ICU Checklist  Sedation:   CAM-ICU:     CAM ICU:   Last Documented RASS:     Currently ordered infusions:    lactated ringers  20 mL/hr at 04/03/23 0725    Reviewed: Yes   Mobility:   Current Mobility Level: PMP Activity: Step 6 - Walks in Room (04/02/2023  9:00 PM)    Current PT Order: No  Current OT Order: No Reviewed: Yes   Respiratory (n/a if blank):   Ventilator Time:    Last Recorded Vent Mode:  PEEP/EPAP:  [0 cm H20-5 cm H20] 5 cm H20 Reviewed: Yes   Gastrointenstinal  Last Bowel Movement:   Last BM Date: 04/01/23 Reviewed: Yes   CAUTI Prevention (n/a if blank):  Foley Day:        Reviewed: Yes   Blood Steam Infection Prevention (n/a if blank):    Reviewed: Yes   DVT Chemoprophylaxis (none if blank):   enoxaparin  (LOVENOX ) syringe 40 mg  Reviewed: Yes       Plan     NEUROLOGICAL: Acute metabolic encephalopathy  Delirium precautions including maintenance of day/night wake cycles, frequent reorientation as needed  Physical therapy/Occupational Therapy/Out of bed to chair, as indicated/tolerated  No need for neuro imaging  Avoid long acting opiates and benzodiazepines    CARDIOVASCULAR: NAI  Blood Pressure Goal: MAP > 65 mmHg  EKG (March 01) - NSR, no ST/T changes, QTc 452 ms  Echo (Feb '25) - EF 63%, normal  PULMONARY: Acute on chronic hypoxic hypercarbic respiratory failure / OHS / Bronchiectasis  Supplemental oxygen as needed to keep sat > 90%  Intubated for elective procedure, unable to extubate  CXR - low lung volumes, no focal opacities  VBG - 7.22/95/47. CO2 31  Extubate to NPPV 12/8. Wean FiO2 to maintain goal oxygen saturation    RENAL: NAI  Monitor urine  output    GASTROINTESTINAL: GERD / N&V / Pancreatic mass / Candidal esophagitis  At risk for constipation  Unable to repeat biopsy today due to large amount of food in stomach  Biopsy from 2/26 was indeterminate  Continue fluconazole   Nutrition: NPO  Bowel Regimen    INFECTIOUS DISEASE: NAI  Monitor off antibiotics  Microbiology History - n/a    HEMATOLOGY/ONCOLOGY: Pancreatic mass  Oncology following    ENDOCRINOLOGY/RHEUMATOLOGY: DM2 / Hyperglycemia  NPO. Med SSI  A1C 6.0    SKIN  NAI           This patient is critically ill with life-threatening condition(s) and a high probability of sudden clinically significant deterioration due to the condition(s) noted in the assessment and plan, which requires the highest level of physician/advance practice provider preparedness to intervene urgently. Full attention to the direct care of this patient was provided for the period of time noted below. Any critical care time performed today is exclusive of teaching and billable procedures and not overlapping with any other physicians or advance practice providers.    I have personally assessed the patient and based my assessment and medical decision-making on a review of the patient's history and 24-hour interval events along with medical records, physical examination, vital signs, analysis of recent laboratory results, evaluation of radiology images, monitoring data for potential decompensation, and additional findings found in detail within ICU team notes. The findings and plan of care was discussed with the care team.    Total critical care time: 50 minutes during this encounter.    Sula LOISE Barb Edelmira, MD   04/03/2023 11:49 AM                                    [1]   Past Surgical History:  Procedure Laterality Date    CYST REMOVAL      ESOPHAGOGASTRODUODENOSCOPY (EGD), ESOPHAGEAL ULTRASOUND (EUS) WITH FINE NEEDLE ASPIRATION/BIOPSY OF ESOPHAGUS, STOMACH, OR DUODENUM N/A 03/25/2023    Procedure: ESOPHAGOGASTRODUODENOSCOPY  (EGD), ESOPHAGEAL ULTRASOUND (EUS) WITH FINE NEEDLE ASPIRATION/BIOPSY OF ESOPHAGUS, STOMACH, OR DUODENUM;  Surgeon: Abagail Carole HERO, MD;  Location: [id number];  Service: Gastroenterology;  Laterality: N/A;    GALLBLADDER SURGERY      HERNIA REPAIR      HYSTERECTOMY      TUBAL LIGATION     [2]   Family History  Problem Relation Name Age of Onset    Heart attack Mother      Diabetes Father      Heart attack Maternal Grandfather      Heart attack Paternal Grandfather      Stroke Son      Heart attack Maternal Uncle      Stomach cancer Cousin      Bone cancer Maternal Aunt      Colon cancer Neg Hx      Breast cancer Neg Hx     [3]   Allergies  Allergen Reactions    Benadryl  [Diphenhydramine ]     Dust Mite Extract  Methocarbamol Itching    Molds & Smuts     Other     Compazine [Prochlorperazine] Anxiety

## 2023-04-04 ENCOUNTER — Encounter: Payer: Self-pay | Admitting: Internal Medicine

## 2023-04-04 ENCOUNTER — Inpatient Hospital Stay

## 2023-04-04 DIAGNOSIS — E1169 Type 2 diabetes mellitus with other specified complication: Secondary | ICD-10-CM

## 2023-04-04 DIAGNOSIS — E662 Morbid (severe) obesity with alveolar hypoventilation: Secondary | ICD-10-CM

## 2023-04-04 LAB — HEPATIC FUNCTION PANEL (LFT)
ALT: 132 U/L — ABNORMAL HIGH (ref <=55)
AST (SGOT): 70 U/L — ABNORMAL HIGH (ref <=41)
Albumin/Globulin Ratio: 1 (ref 0.9–2.2)
Albumin: 3.3 g/dL — ABNORMAL LOW (ref 3.5–5.0)
Alkaline Phosphatase: 187 U/L — ABNORMAL HIGH (ref 37–117)
Bilirubin Direct: 0.2 mg/dL (ref 0.0–0.5)
Bilirubin Indirect: 0.2 mg/dL (ref 0.2–1.0)
Bilirubin, Total: 0.4 mg/dL (ref 0.2–1.2)
Globulin: 3.3 g/dL (ref 2.0–3.6)
Protein, Total: 6.6 g/dL (ref 6.0–8.3)

## 2023-04-04 LAB — CBC
Absolute nRBC: 0 10*3/uL (ref <=0.00)
Hematocrit: 39.4 % (ref 34.7–43.7)
Hemoglobin: 12.7 g/dL (ref 11.4–14.8)
MCH: 30 pg (ref 25.1–33.5)
MCHC: 32.2 g/dL (ref 31.5–35.8)
MCV: 93.1 fL (ref 78.0–96.0)
MPV: 9.4 fL (ref 8.9–12.5)
Platelet Count: 356 10*3/uL — ABNORMAL HIGH (ref 142–346)
RBC: 4.23 10*6/uL (ref 3.90–5.10)
RDW: 12 % (ref 11–15)
WBC: 9.26 10*3/uL (ref 3.10–9.50)
nRBC %: 0 /100{WBCs} (ref <=0.0)

## 2023-04-04 LAB — WHOLE BLOOD GLUCOSE POCT
Whole Blood Glucose POCT: 120 mg/dL — ABNORMAL HIGH (ref 70–100)
Whole Blood Glucose POCT: 100 mg/dL (ref 70–100)
Whole Blood Glucose POCT: 144 mg/dL — ABNORMAL HIGH (ref 70–100)
Whole Blood Glucose POCT: 118 mg/dL — ABNORMAL HIGH (ref 70–100)

## 2023-04-04 LAB — CULTURE, SPUTUM AND LOWER RESPIRATORY

## 2023-04-04 LAB — BASIC METABOLIC PANEL
Anion Gap: 9 (ref 5.0–15.0)
BUN: 9 mg/dL (ref 7–21)
CO2: 31 meq/L — ABNORMAL HIGH (ref 17–29)
Calcium: 8.9 mg/dL (ref 8.5–10.5)
Chloride: 97 meq/L — ABNORMAL LOW (ref 99–111)
Creatinine: 0.6 mg/dL (ref 0.4–1.0)
GFR: >60 mL/min/{1.73_m2} (ref >=60.0)
Glucose: 104 mg/dL — ABNORMAL HIGH (ref 70–100)
Potassium: 3.9 meq/L (ref 3.5–5.3)
Sodium: 137 meq/L (ref 135–145)

## 2023-04-04 LAB — VENOUS BLOOD GAS
Temperature: 36.6 C
Venous Base Excess: 7.2 meq/L — ABNORMAL HIGH (ref <=5.3)
Venous HCO3: 33.9 meq/L — ABNORMAL HIGH (ref 23.8–32.4)
Venous O2 Saturation: 90.6 % (ref 18.6–94.8)
Venous PCO2: 58.6 mmHg (ref 39.0–63.0)
Venous PO2: 56.9 mmHg (ref 17.0–59.0)
Venous Total CO2: 35.7 meq/L — ABNORMAL HIGH (ref 25.8–34.1)
Venous pH: 7.378 (ref 7.310–7.410)

## 2023-04-04 LAB — PHOSPHORUS: Phosphorus: 3.9 mg/dL (ref 2.3–4.7)

## 2023-04-04 LAB — MAGNESIUM: Magnesium: 2 mg/dL (ref 1.6–2.6)

## 2023-04-04 MED ORDER — TRAMADOL HCL 50 MG PO TABS
50.0000 mg | ORAL_TABLET | Freq: Four times a day (QID) | ORAL | Status: DC | PRN
Start: 2023-04-04 — End: 2023-04-07
  Administered 2023-04-04 – 2023-04-07 (×4): 50 mg via ORAL
  Filled 2023-04-04 (×5): qty 1

## 2023-04-04 MED ORDER — LIDOCAINE 5 % EX PTCH
2.0000 | MEDICATED_PATCH | CUTANEOUS | Status: DC
Start: 2023-04-04 — End: 2023-04-07
  Administered 2023-04-04 – 2023-04-05 (×2): 2 via TRANSDERMAL
  Filled 2023-04-04 (×3): qty 2

## 2023-04-04 MED ORDER — FUROSEMIDE 10 MG/ML IJ SOLN
20.0000 mg | Freq: Once | INTRAMUSCULAR | Status: AC
Start: 2023-04-04 — End: 2023-04-04
  Administered 2023-04-04: 20 mg via INTRAVENOUS
  Filled 2023-04-04: qty 4

## 2023-04-04 NOTE — Progress Notes (Signed)
 Cardiac Intensive Care Unit (CICU)  Progress Note      Patient Name: Valerie May  MRN: [id number]  Room: FI300/FI300.01    ICU Day: Patient does not have an ICU stay during this admission.    Attending Attestation:     Attending Statement      As the physician signing this note, I have reviewed the history and exam and pertinent imaging results. My addendum revises, addends or confirms the recommendations or plans in this note. I discussed with the plan of care with the NPP/Resident and I agree with the findings noted unless revised or updated in my notations. I formulated all of the medical decision making noted in the assessment & plan.    This patient is critically ill with life-threatening condition(s) and a high probability of sudden clinically significant deterioration due to the condition(s) noted in the assessment and plan, which requires the highest level of physician's / advance practice provider's preparedness to intervene urgently.  Full attention to the direct care of this patient was provided for the period of time noted, which includes review of laboratory data, radiology results, discussion with consultant(s) monitoring for potential decompensation and performing a history exam and medical decision making.  All critical care time personally performed today was exclusive of teaching, billable procedures, and not overlapping with any other physicians or advance practice providers.    Extubated and ventilating well  Awaiting tumor markers  Incentive spirometer, pulmonary toilet and wean nasal cannula as tolerated  Diuresis with furosemide  20 mg  PO clear diet, plan for repeat EGD and EUS biopsy likely Monday 3/10  OK from [facility] standpoint for transfer to medicine telemetry, awaiting bed availability    I have personally examined the patient. I agree with the findings noted in the non-physician provider note and have reviewed the patient's history, exam, laboratory findings, imaging results,  monitoring data for potential decompensation, and additional findings found in detail within the team notes. Numerous life or organ-supporting interventions have been undertaken in addition to discussion with the consultant(s) and clinical staff, including advance practice providers, critical care fellows, house staff, and nurses, as well as counseling the patient/family members (as described in the note). I formulated all the medical decision-making and coordinated care as stated in the Assessment & Plan in discussion with the non-physician provider. Independent non-physician provider (NPP) time is indicated by the minutes as noted in the NPP note.        Physician care time (independent): 15 minutes   Total rounding/joint time:  15 minutes   Total critical care time: 73 minutes   Attending Physician: Duwaine FORBES Derby, MD     Substantive Service provided by   Physician or NPP   (provided > 50% of total care time): Non-physician provider (PA/NP)                           Interval Events   24 hour events: admitted to CICU during the day following aborted EGD that patient remains intubated post.   Extubated upon arrive to unit    Subjective   Hospital Course:  53 y.o. female w/ PMHx asthma, eosinophilia, bronchiectasis, recurrent PNA, GERD, DM2, polycythemia, obesity. Admitted to hospital on 2/22 w/ N&V, abdominal pain and altered mental status. Noted to be hypercarbic on VBG, [facility] consulted, recommended to start patient on nocturnal CPAP for OHS and avoid narcotics. 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 GI consulted.  EGD / EUS w/ fine needle biopsy performed on 2/26. During this procedure te patient was extubated to CPAP w/ NPA. 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 Oncology recommended to repeat biopsy. Today patient was taken for repeat EGD for EUS w/ biopsy but could not be completed due to large amount of food in the stomach. Anesthesia was unable to  extubate her due to low Vt and high ETCO2. Called [facility] for admission.     3/7: Extubated in CICU  3/8: downgraded to floor, awaiting bed. Lasix  20mg  X 1        Assessment & Plan   Assessment and Plan   Summary: DENAJA May a 53 y.o. female who presents following EGD unable to extubate due to acute hypercarbic respiratory failure     System (Problem List) Plan   NEUROLOGICAL:  Acute metabolic encephalopathy - resovled  No acute issues    Current PT Order: No  Current OT Order: No PRN Tylenol , Fioricet , Toradol  for pain  Delirium precautions including maintenance of day/night wake cycles, frequent reorientation as needed  Physical therapy/Occupational Therapy/Out of bed to chair, as indicated/tolerated   CARDIOVASCULAR:   NAI    Ejection Fraction          03/21/2023    13:52   Ejection Fraction   Ejection Fraction 63     HD stable   Echo from Feb 2025 - EF 63%, normal function  Blood Pressure Goal: MAP > 65   PULMONARY:   Acute Hypoxic respiratory failure   Acute on chronic hypercarbic respiratory failure.   OHS    Last vent settings if on vent:  FiO2 (%):  [50 %] 50 %  Ventilator Time (if on vent):   1d 1h  Extubated post procedure in ICU.   VBG this morning consistent with chronic CO2 retention  Remains on 4L NC.  Give 20mg  lasix    CPAP QHS for sleep apnea  Supplemental oxygen as needed to keep sat > 90%   RENAL:   NAI      Intake/Output Summary (Last 24 hours) at 04/04/2023 1416  Last data filed at 04/04/2023 0400  Gross per 24 hour   Intake 150 ml   Output 500 ml   Net -350 ml    Spot diuresis, lasix  20mg  today   Replace lytes as needed   GASTROINTESTINAL:  GERD  Pancreatic Mass   Candida Esophagitis   N/V      Last Bowel Movement:   Last BM Date: 04/04/23 LFTs stable  Planning for repeat EGD with biopsy on monday  Nutrition: clear liquid diet until repeat EGD  Bowel Regimen   Continue fluconazole       INFECTIOUS DISEASE:   No acute issues    Temp (24hrs), Avg:97.5 F (36.4 C), Min:97 F (36.1 C), Max:97.8 F  (36.6 C)     1 mL  Lab Results   Component Value Date    WBC 9.26 04/04/2023      CAUTI Prevention (n/a if blank):  Foley Day:         Blood Steam Infection Prevention (n/a if blank):      Last culture dates: n/a           HAI Prevention Plan  N/A - No indwelling foley catheter  N/A - No central line/PICC in place       HEME/ONC:   Pancreatic mass      Oncology following.   SCDs  DVT chemoprophylaxis: enoxaparin  (LOVENOX )  syringe 40 mg    ENDO/RHEUM:   DM2  SSI   Goal glucose range 100 - 180       Current Infusions (n/a if blank):            Advance Care Planning: Open ACP Navigator  Code Status: Full Code  Next of Kin:  Primary Emergency Contact: Hogan,Sanros Dorn           ADVANCE CARE PLAN                     Objective      Examination:   Temp:  [97 F (36.1 C)-97.8 F (36.6 C)] 97.8 F (36.6 C)  Heart Rate:  [82-97] 95  Resp Rate:  [16-28] 20  BP: (96-147)/(51-73) 127/60  FiO2 (%):  [50 %] 50 %  Neuro:    Non-focal neuro exam alert & oriented x 3   Pt reports feeling tired    Lungs:   Mild crackles at bases     Cardiac:   normal rate, regular rhythm, normal S1, S2, no murmurs, rubs, clicks or gallops     Abdomen:    soft, nontender, nondistended, no masses or organomegaly     Extremities:   peripheral pulses normal no pedal edema    Skin:     Warm, dry and intact   Fick/Cardiac Hemodynamics   VO2 Determination  Height: 149.9 cm (4' 11)                   SVR Determination  MAP (mmHg): 87       Most Recent Labs  9.26 \ 12.7 / 356*    / 39.4 \    03/08 0352    137 97* 9 / 104*   3.9 31* 0.6 \    03/08 0352              Additional Diagnoses:     Patient has a BMI of 35.4 kg/m2    Diagnosis: Class 2 Obesity: BMI of 35 to 39.9                              I have personally assessed the patient and based my assessment and medical decision-making on a review of the patient's history and 24-hour interval events along with medical records, physical examination, vital signs, analysis of recent laboratory results,  evaluation of radiology images, and monitoring data. The findings and plan of care was discussed with the care team.      Total care time: 43 minutes    Hoy CHRISTELLA Bertrand, PA-C  04/04/2023 2:16 PM

## 2023-04-04 NOTE — Progress Notes (Signed)
 Surgery Daily Progress Note   Team 5, [facility]  [phone] or [phone]    Date/Time: 04/04/23 11:58 AM  Bed:  FI300/FI300.01  Hospital Day: 15  1 Day Post-Op  Procedure(s):  ESOPHAGOGASTRODUODENOSCOPY (EGD), ESOPHAGEAL ULTRASOUND (EUS) WITH FINE NEEDLE ASPIRATION/BIOPSY OF ESOPHAGUS, STOMACH, OR DUODENUM    Assessment:   Valerie May is a 53 y.o. female with incidentally found to have pancreatic head mass s/p EUS/FNA with insufficient tissue but suspicious for acinar cell neoplasm, NET or solid pseudopapillary neoplasm.     Patient much improved, GI to reattempt EGD biopsy once patient recovered, will follow up biopsy results.     Plan:   - Follow up medical oncology input  - Follow up tumor markers  - Follow up biopsy results   - Fluconazole  per primary   - Remaining care per primary     Interval History:   Extubated    Pain well controlled,+ nausea/-emesis. +Flatus/+BM    UOP: , 1x  Stool: 1x    Diet: Gelatein 20  Adult diet Therapeutic/ Modified; Clear liquid; Thin (IDDSI level 0)  Ensure Clear Quantity: A. One; Frequency: TID (3 times a day)       Medications:     Current Facility-Administered Medications   Medication Dose Route Frequency    aluminum & magnesium  / diphenhydrAMINE  / lidocaine   15 mL Oral QID    aspirin  EC  81 mg Oral Daily    budesonide -formoterol   2 puff Inhalation BID    cetirizine   10 mg Oral Daily    enoxaparin   40 mg Subcutaneous Daily    fluconazole   400 mg Oral Q24H SCH    fluocinonide    Topical BID    insulin  lispro  1-4 Units Subcutaneous QHS    insulin  lispro  1-8 Units Subcutaneous TID AC    metoclopramide   5 mg Oral QID    montelukast   10 mg Oral QHS    pantoprazole   40 mg Oral Q12H SCH    polycarbophil  625 mg Oral BID    polyethylene glycol  17 g Oral Daily    senna-docusate  2 tablet Oral QHS    venlafaxine   25 mg Oral TID MEALS       acetaminophen , albuterol , alum & mag hydroxide-simethicone , bengay greaseless, benzocaine -menthol , benzonatate , bisacodyl ,  butalbital -acetaminophen -caffeine , carboxymethylcellulose sodium, dextrose  **OR** dextrose  **OR** dextrose  **OR** glucagon  (rDNA), ketorolac , lidocaine  viscous, Lubrifresh PM, magnesium  sulfate, naloxone , ondansetron  **OR** ondansetron , potassium & sodium phosphates , potassium chloride  **OR** potassium chloride  **OR** potassium chloride , saline     Physical Exam:   Current Vitals:   Vitals:    04/04/23 0900   BP: 127/60   Pulse: 97   Resp: (!) 26   Temp:    SpO2: 96%     Vital signs in last 24 hours:  Temp:  [97 F (36.1 C)-97.8 F (36.6 C)] 97.8 F (36.6 C)  Heart Rate:  [82-98] 97  Resp Rate:  [16-28] 26  BP: (96-147)/(51-73) 127/60  FiO2 (%):  [39 %-50 %] 50 %  Body mass index is 35.4 kg/m.    Intake and Output Summary (Last 24 hours):  I/O last 3 completed shifts:  In: 1589.67 [P.O.:1050; I.V.:520.67; IV Piggyback:19]  Out: 1301 [Urine:1300; Blood:1]    PHYSICAL EXAM:  General: NAD  Chest: Normal work of breathing on 4L NC  Abdomen:  Soft, non-distended,bilateral upper abdominal, no rebound or guarding.   Extremities: Moving all extremities spontaneously  Neuro:  Grossly normal  Skin: Warm     Labs:  Hematology   Recent Labs     04/04/23  0352 04/03/23  0302   WBC 9.26 9.02   Hemoglobin 12.7 13.5   Hematocrit 39.4 42.9   Platelet Count 356* 396*      Chemistry   Recent Labs     04/04/23  0352 04/03/23  0302 04/02/23  0415   Sodium 137 135 135   Potassium 3.9 4.4 4.0   Chloride 97* 97* 98*   CO2 31* 31* 27   BUN 9 7 8    Creatinine 0.6 0.6 0.7   Glucose 104* 106* 119*   Calcium  8.9 9.6 9.4   Magnesium  2.0  --   --    Phosphorus 3.9  --   --       Coagulation   No results for input(s): PT, INR, PTT in the last 72 hours.   Liver Function Tests   Recent Labs     04/04/23  0352   AST (SGOT) 70*   ALT 132*   Alkaline Phosphatase 187*   Protein, Total 6.6   Albumin 3.3*   Bilirubin, Total 0.4   Bilirubin Direct 0.2          Rads:   Radiological Procedure reviewed.  XR Chest AP Portable    Result Date:  04/04/2023   No acute abnormality within the chest. Elwood Miyamoto, MD 04/04/2023 10:14 AM     Signed by: Lupie Mirza, MD

## 2023-04-04 NOTE — Plan of Care (Signed)
 Problem: Pain interferes with ability to perform ADL  Goal: Pain at adequate level as identified by patient  Flowsheets (Taken 04/03/2023 2000 by Dorrene Salen, RN)  Pain at adequate level as identified by patient:   Identify patient comfort function goal   Assess for risk of opioid induced respiratory depression, including snoring/sleep apnea. Alert healthcare team of risk factors identified.   Assess pain on admission, during daily assessment and/or before any as needed intervention(s)   Reassess pain within 30-60 minutes of any procedure/intervention, per Pain Assessment, Intervention, Reassessment (AIR) Cycle   Evaluate if patient comfort function goal is met   Evaluate patient's satisfaction with pain management progress   Offer non-pharmacological pain management interventions     Problem: Side Effects from Pain Analgesia  Goal: Patient will experience minimal side effects of analgesic therapy  Flowsheets (Taken 04/03/2023 2000 by Dorrene Salen, RN)  Patient will experience minimal side effects of analgesic therapy:   Monitor/assess patient's respiratory status (RR depth, effort, breath sounds)   Assess for changes in cognitive function   Prevent/manage side effects per LIP orders (i.e. nausea, vomiting, pruritus, constipation, urinary retention, etc.)   Evaluate for opioid-induced sedation with appropriate assessment tool (i.e. POSS)     Problem: Day of Admission - Heart Failure  Goal: Heart Failure Admission  Flowsheets (Taken 04/02/2023 1049 by Onita Dixon, RN)  Heart Failure Admission:   Vital signs and telemetry per policy   Strict Intake/Output     Problem: Everyday - Heart Failure  Goal: Stable Vital Signs and Fluid Balance  Flowsheets (Taken 04/03/2023 2100 by Dorrene Salen, RN)  Stable Vital Signs and Fluid Balance:   Daily Standing Weights in the morning using the same scale, after using the bathroom and before breadfast.  If unable to stand, zero the bed and use the bed scale   Monitor, assess  vital signs and telemetry per policy   Monitor labs and report abnormalities to physician   Fluid Restriction   Strict Intake/Output   Assess for swelling/edema   Wean oxygen as needed if appropriate  Goal: Mobility/Activity is Maintained at Optimal Level for Patient  Flowsheets (Taken 03/27/2023 0354 by Erla Drone, RN)  Mobility/Activity is Maintained at Optimal Level for Patient:   Increase mobility as tolerated/progressive mobility protocol   Assess for changes in respiratory status, level of consciousness and/or development of fatigue  Goal: Nutritional Intake is Adequate  Flowsheets (Taken 04/03/2023 2100 by Dorrene Salen, RN)  Nutritional Intake is Adequate:   Fluid Restricction if needed   Patient and family teaching on low sodium diet   Encourage/perform oral hygiene as appropriate   Assess appetite,anorexia and amount of meal/food tolerated   Consult/Collaborate with Nutritionist  Goal: Teaching-Using CHF Warning Zones and Educational Videos  Flowsheets (Taken 03/26/2023 0301 by Erla Drone, RN)  Teaching-Using CHF Warning Zones and Educational Videos:   Signs & Symptoms of CHF   Medications     Problem: Compromised Activity/Mobility  Goal: Activity/Mobility Interventions  Flowsheets (Taken 04/04/2023 2000)  Activity/Mobility Interventions: Pad bony prominences, TAP Seated positioning system when OOB, Promote PMP, Reposition q 2 hrs / turn clock, Offload heels     Problem: Compromised Sensory Perception  Goal: Sensory Perception Interventions  Flowsheets (Taken 04/04/2023 2000)  Sensory Perception Interventions: Offload heels, Pad bony prominences, Reposition q 2hrs/turn Clock, Q2 hour skin assessment under devices if present     Problem: Compromised Moisture  Goal: Moisture level Interventions  Flowsheets (Taken 04/04/2023 2000)  Moisture level Interventions: Moisture wicking  products, Moisture barrier cream     Problem: Compromised Nutrition  Goal: Nutrition Interventions  Flowsheets (Taken 04/04/2023  2000)  Nutrition Interventions: Discuss nutrition at Rounds, I&Os, Document % meal eaten, Daily weights     Problem: Compromised Friction/Shear  Goal: Friction and Shear Interventions  Flowsheets (Taken 04/04/2023 2000)  Friction and Shear Interventions: Pad bony prominences, Off load heels, HOB 30 degrees or less unless contraindicated, Consider: TAP seated positioning, Heel foams     Problem: Pain  Goal: Pain at adequate level as identified by patient  Flowsheets (Taken 04/03/2023 2000 by Dorrene Salen, RN)  Pain at adequate level as identified by patient:   Identify patient comfort function goal   Assess for risk of opioid induced respiratory depression, including snoring/sleep apnea. Alert healthcare team of risk factors identified.   Assess pain on admission, during daily assessment and/or before any as needed intervention(s)   Reassess pain within 30-60 minutes of any procedure/intervention, per Pain Assessment, Intervention, Reassessment (AIR) Cycle   Evaluate if patient comfort function goal is met   Evaluate patient's satisfaction with pain management progress   Offer non-pharmacological pain management interventions

## 2023-04-04 NOTE — Plan of Care (Signed)
 Problem: Pain interferes with ability to perform ADL  Goal: Pain at adequate level as identified by patient  Outcome: Progressing     Problem: Side Effects from Pain Analgesia  Goal: Patient will experience minimal side effects of analgesic therapy  Outcome: Progressing     Problem: Day of Admission - Heart Failure  Goal: Heart Failure Admission  Outcome: Progressing

## 2023-04-05 LAB — BASIC METABOLIC PANEL
Anion Gap: 10 (ref 5.0–15.0)
BUN: 6 mg/dL — ABNORMAL LOW (ref 7–21)
CO2: 31 meq/L — ABNORMAL HIGH (ref 17–29)
Calcium: 9.1 mg/dL (ref 8.5–10.5)
Chloride: 96 meq/L — ABNORMAL LOW (ref 99–111)
Creatinine: 0.5 mg/dL (ref 0.4–1.0)
GFR: >60 mL/min/{1.73_m2} (ref >=60.0)
Glucose: 97 mg/dL (ref 70–100)
Potassium: 3.6 meq/L (ref 3.5–5.3)
Sodium: 137 meq/L (ref 135–145)

## 2023-04-05 LAB — WHOLE BLOOD GLUCOSE POCT
Whole Blood Glucose POCT: 105 mg/dL — ABNORMAL HIGH (ref 70–100)
Whole Blood Glucose POCT: 118 mg/dL — ABNORMAL HIGH (ref 70–100)
Whole Blood Glucose POCT: 99 mg/dL (ref 70–100)
Whole Blood Glucose POCT: 149 mg/dL — ABNORMAL HIGH (ref 70–100)
Whole Blood Glucose POCT: 92 mg/dL (ref 70–100)

## 2023-04-05 LAB — CBC
Absolute nRBC: 0 10*3/uL (ref <=0.00)
Hematocrit: 40.2 % (ref 34.7–43.7)
Hemoglobin: 13.1 g/dL (ref 11.4–14.8)
MCH: 30 pg (ref 25.1–33.5)
MCHC: 32.6 g/dL (ref 31.5–35.8)
MCV: 92.2 fL (ref 78.0–96.0)
MPV: 9.7 fL (ref 8.9–12.5)
Platelet Count: 366 10*3/uL — ABNORMAL HIGH (ref 142–346)
RBC: 4.36 10*6/uL (ref 3.90–5.10)
RDW: 12 % (ref 11–15)
WBC: 8.99 10*3/uL (ref 3.10–9.50)
nRBC %: 0 /100{WBCs} (ref <=0.0)

## 2023-04-05 LAB — CANCER ANTIGEN 19-9: Cancer Antigen 19-9: 15 U/mL (ref <34)

## 2023-04-05 NOTE — Progress Notes (Signed)
 Cardiac Intensive Care Unit (CICU)  Progress Note      Patient Name: Valerie May  MRN: [id number]  Room: FI300/FI300.01    ICU Day: Patient does not have an ICU stay during this admission.    Attending Attestation:     Attending Statement      As the physician signing this note, I have reviewed the history and exam and pertinent imaging results. My addendum revises, addends or confirms the recommendations or plans in this note. I discussed with the plan of care with the NPP/Resident and I agree with the findings noted unless revised or updated in my notations. I formulated all of the medical decision making noted in the assessment & plan.    This patient is critically ill with life-threatening condition(s) and a high probability of sudden clinically significant deterioration due to the condition(s) noted in the assessment and plan, which requires the highest level of physician's / advance practice provider's preparedness to intervene urgently.  Full attention to the direct care of this patient was provided for the period of time noted, which includes review of laboratory data, radiology results, discussion with consultant(s) monitoring for potential decompensation and performing a history exam and medical decision making.  All critical care time personally performed today was exclusive of teaching, billable procedures, and not overlapping with any other physicians or advance practice providers.    PO clear diet, plan for repeat EGD and EUS biopsy, 3/11  OK from [facility] standpoint for transfer to medicine telemetry, awaiting bed availability    I have personally examined the patient. I agree with the findings noted in the non-physician provider note and have reviewed the patient's history, exam, laboratory findings, imaging results, monitoring data for potential decompensation, and additional findings found in detail within the team notes. Numerous life or organ-supporting interventions have been undertaken in  addition to discussion with the consultant(s) and clinical staff, including advance practice providers, critical care fellows, house staff, and nurses, as well as counseling the patient/family members (as described in the note). I formulated all the medical decision-making and coordinated care as stated in the Assessment & Plan in discussion with the non-physician provider. Independent non-physician provider (NPP) time is indicated by the minutes as noted in the NPP note.        Physician care time (independent): 15 minutes   Total rounding/joint time:  15 minutes   Total critical care time: 65 minutes   Attending Physician: Duwaine FORBES Derby, MD     Substantive Service provided by   Physician or NPP   (provided > 50% of total care time): Non-physician provider (PA/NP)                           Interval Events   24 hour events: EGD performed, EUS biopsy of pancreatic head mass aborted 3/7 due to large amount of food in stomach and CO2 retention, Extubated 3/7 in ICU. C/o lower lip pain/abrasion following procedure. Pending downgrade/transfer.     Subjective   Hospital Course:  53 y.o. female w/ PMHx asthma, eosinophilia, bronchiectasis, recurrent PNA, GERD, DM2, polycythemia, obesity. Admitted to hospital on 2/22 w/ N&V, abdominal pain and altered mental status. Noted to be hypercarbic on VBG, [facility] consulted, recommended to start patient on nocturnal CPAP for OHS and avoid narcotics. 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 GI consulted. EGD / EUS w/ fine needle biopsy performed on 2/26. During this procedure te patient  was extubated to CPAP w/ NPA. 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 Oncology recommended to repeat biopsy. Today patient was taken for repeat EGD for EUS w/ biopsy but could not be completed due to large amount of food in the stomach. Anesthesia was unable to extubate her due to low Vt and high ETCO2. Called [facility] for admission.     3/7: Transferred  to CICU post EGD and aborted biopsy due to large amount of food in stomach. Extubated in CICU  3/8: Downgraded to floor, awaiting bed availability. Lasix  20mg  X 1          Assessment & Plan   Assessment and Plan   Summary: Valerie May a 53 y.o. female who presents following EGD unable to extubate in GI due to acute hypercarbic respiratory failure, low tidal volumes, Elevated CO2     System (Problem List) Plan   NEUROLOGICAL:  Acute metabolic   encephalopathy - resolved  Body pain/lip pain     Current PT Order: Yes  Current OT Order: Yes PRN Tylenol , Fioricet , Toradol  for pain  Delirium precautions including maintenance of day/night wake cycles, frequent reorientation as needed  Physical therapy/Occupational Therapy/Out of bed to chair, as indicated/tolerated  Ambulation as tolerated   CARDIOVASCULAR:   No active issues    Ejection Fraction          03/21/2023    13:52   Ejection Fraction   Ejection Fraction 63     Hemodynamically stable   Echo from Feb 2025 - EF 63%, normal function  Blood Pressure Goal: MAP > 65   PULMONARY:   Acute Hypoxic respiratory failure   Acute on chronic hypercarbic respiratory failure.   Obesity hypoventilation  Obstructive sleep apnea    Last vent settings if on vent:  FiO2 (%):  [24 %] 24 %  PEEP/EPAP:  [8 cm H20] 8 cm H20  Ventilator Time (if on vent):   1d 23h  Extubated post procedure in ICU 3/8   Oxygenating well on 4L NC.  CPAP QHS for sleep apnea  Supplemental oxygen as needed to keep sat > 90%   RENAL:   No active issues      Intake/Output Summary (Last 24 hours) at 04/05/2023 1238  Last data filed at 04/05/2023 1000  Gross per 24 hour   Intake 200 ml   Output 2600 ml   Net -2400 ml    Cr: 0.5   Replace lytes as needed  24 hr I&O: -2.25L    GASTROINTESTINAL:  GERD  Pancreatic Mass   Candida Esophagitis   N/V      Last Bowel Movement:   Last BM Date: 04/05/23 Planning for repeat EGD with biopsy on Tuesday  3/11  Nutrition: clear liquid diet until repeat EGD, NPO after midnight  3/11  Bowel Regimen   Continue fluconazole       INFECTIOUS DISEASE:   No acute issues    Temp (24hrs), Avg:98.2 F (36.8 C), Min:97.4 F (36.3 C), Max:98.6 F (37 C)     1 mL  Lab Results   Component Value Date    WBC 8.99 04/05/2023      CAUTI Prevention (n/a if blank):  Foley Day:         Blood Steam Infection Prevention (n/a if blank):    WBC 8.9/tmax: afebrile   Cont Diflucan  for candida esophagitis     Culture:   3/7: MRSA neg  3/6: Viral Resp: neg   3/6: Resp cx:  neg         HAI Prevention Plan  N/A - No indwelling foley catheter  N/A - No central line/PICC in place       HEME/ONC:   Pancreatic mass      Oncology following, awaiting tumor markers .CA 19-9, chromogranin A.    Hepatopancreatobiliary surgery team following, awaiting tissue diagnosis and will arrange for outpatient follow up.  SCDs  DVT chemoprophylaxis: enoxaparin  (LOVENOX ) syringe 40 mg    ENDO/RHEUM:   Diabetes mellitus, type 2, HgbA1c 6.0% SSI   Goal glucose range 100 - 180 (100-144)        Current Infusions (n/a if blank):            Advance Care Planning: Open ACP Navigator  Code Status: Full Code  Next of Kin:  Primary Emergency Contact: Buchberger,Sanros Dorn           ADVANCE CARE PLAN                     Objective      Examination:   Temp:  [97.4 F (36.3 C)-98.6 F (37 C)] 98.1 F (36.7 C)  Heart Rate:  [86-125] 125  Resp Rate:  [19-38] 36  BP: (119-136)/(59-65) 125/59  FiO2 (%):  [24 %] 24 %  General: sitting up in bed, taking diet     HEENT: lower mid central lip partial thickness excoriation   Neuro:    Non-focal neuro exam alert & oriented x 3     Lungs:   Distant breath sounds     Cardiac:   normal rate, regular rhythm, normal S1, S2, no murmurs, rubs, clicks or gallops     Abdomen:    soft, nontender, nondistended, no masses or organomegaly     Extremities:   peripheral pulses normal no pedal edema    Skin:     Warm, dry and intact   Fick/Cardiac Hemodynamics   VO2 Determination  Height: 149.9 cm (4' 11)                    SVR Determination  MAP (mmHg): 84       Most Recent Labs  8.99 \ 13.1 / 366*    / 40.2 \    03/09 0337    137 96* 6* / 97   3.6 31* 0.5 \    03/09 0337              Additional Diagnoses:     Patient has a BMI of 35.4 kg/m2    Diagnosis: Class 2 Obesity: BMI of 35 to 39.9                              I have personally assessed the patient and based my assessment and medical decision-making on a review of the patient's history and 24-hour interval events along with medical records, physical examination, vital signs, analysis of recent laboratory results, evaluation of radiology images, and monitoring data. The findings and plan of care was discussed with the care team.      Total care time: 35  minutes    Delon Core, PA-C  04/05/2023 12:38 PM     This note has been edited or addended from the original author.

## 2023-04-05 NOTE — Plan of Care (Signed)
 Problem: Side Effects from Pain Analgesia  Goal: Patient will experience minimal side effects of analgesic therapy  Flowsheets (Taken 04/05/2023 2000)  Patient will experience minimal side effects of analgesic therapy: Monitor/assess patient's respiratory status (RR depth, effort, breath sounds)     Problem: Day of Admission - Heart Failure  Goal: Heart Failure Admission  Flowsheets (Taken 04/02/2023 1049 by Onita Dixon, RN)  Heart Failure Admission:   Vital signs and telemetry per policy   Strict Intake/Output     Problem: Everyday - Heart Failure  Goal: Stable Vital Signs and Fluid Balance  Flowsheets (Taken 04/03/2023 2100 by Dorrene Salen, RN)  Stable Vital Signs and Fluid Balance:   Daily Standing Weights in the morning using the same scale, after using the bathroom and before breadfast.  If unable to stand, zero the bed and use the bed scale   Monitor, assess vital signs and telemetry per policy   Monitor labs and report abnormalities to physician   Fluid Restriction   Strict Intake/Output   Assess for swelling/edema   Wean oxygen as needed if appropriate  Goal: Mobility/Activity is Maintained at Optimal Level for Patient  Flowsheets (Taken 03/27/2023 0354 by Erla Drone, RN)  Mobility/Activity is Maintained at Optimal Level for Patient:   Increase mobility as tolerated/progressive mobility protocol   Assess for changes in respiratory status, level of consciousness and/or development of fatigue  Goal: Nutritional Intake is Adequate  Flowsheets (Taken 04/03/2023 2100 by Dorrene Salen, RN)  Nutritional Intake is Adequate:   Fluid Restricction if needed   Patient and family teaching on low sodium diet   Encourage/perform oral hygiene as appropriate   Assess appetite,anorexia and amount of meal/food tolerated   Consult/Collaborate with Nutritionist     Problem: Compromised Activity/Mobility  Goal: Activity/Mobility Interventions  Flowsheets (Taken 04/05/2023 2000)  Activity/Mobility Interventions: Pad bony  prominences, TAP Seated positioning system when OOB, Promote PMP, Reposition q 2 hrs / turn clock, Offload heels     Problem: Compromised Sensory Perception  Goal: Sensory Perception Interventions  Flowsheets (Taken 04/05/2023 2000)  Sensory Perception Interventions: Offload heels, Pad bony prominences, Reposition q 2hrs/turn Clock, Q2 hour skin assessment under devices if present     Problem: Compromised Friction/Shear  Goal: Friction and Shear Interventions  Flowsheets (Taken 04/05/2023 2000)  Friction and Shear Interventions: Pad bony prominences, Off load heels, HOB 30 degrees or less unless contraindicated, Consider: TAP seated positioning, Heel foams     Problem: Psychosocial and Spiritual Needs  Goal: Demonstrates ability to cope with hospitalization/illness  Flowsheets (Taken 03/30/2023 0107 by Lavonia Kast, RN)  Demonstrates ability to cope with hospitalizations/illness:   Encourage verbalization of feelings/concerns/expectations   Provide quiet environment   Assist patient to identify own strengths and abilities   Encourage patient to set small goals for self

## 2023-04-05 NOTE — Plan of Care (Signed)
 Problem: Pain interferes with ability to perform ADL  Goal: Pain at adequate level as identified by patient  04/05/2023 1451 by Miasha Emmons, Clabe, RN  Flowsheets (Taken 04/04/2023 2000 by Joannie Coil, RN)  Pain at adequate level as identified by patient: Identify patient comfort function goal  Problem: Side Effects from Pain Analgesia  Goal: Patient will experience minimal side effects of analgesic therapy  Flowsheets (Taken 04/04/2023 2000 by Joannie Coil, RN)  Patient will experience minimal side effects of analgesic therapy: Monitor/assess patient's respiratory status (RR depth, effort, breath sounds)     Problem: Compromised Activity/Mobility  Goal: Activity/Mobility Interventions  Flowsheets (Taken 04/05/2023 0800)  Activity/Mobility Interventions: Pad bony prominences, TAP Seated positioning system when OOB, Promote PMP, Reposition q 2 hrs / turn clock, Offload heels     Problem: Safety  Goal: Patient will be free from injury during hospitalization  04/05/2023 1451 by Terris Clabe, RN  Flowsheets (Taken 03/31/2023 1055 by Jerrye Karna LABOR, RN)  Patient will be free from injury during hospitalization:   Assess patient's risk for falls and implement fall prevention plan of care per policy   Provide and maintain safe environment   Use appropriate transfer methods   Ensure appropriate safety devices are available at the bedside   Hourly rounding   Assess for patients risk for elopement and implement Elopement Risk Plan per policy

## 2023-04-06 ENCOUNTER — Encounter: Payer: Self-pay | Admitting: Anesthesiology

## 2023-04-06 DIAGNOSIS — R053 Chronic cough: Secondary | ICD-10-CM

## 2023-04-06 DIAGNOSIS — J4550 Severe persistent asthma, uncomplicated: Secondary | ICD-10-CM

## 2023-04-06 LAB — CHROMOGRANIN A: Chromogranin A, S: 43 ng/mL (ref <93)

## 2023-04-06 LAB — BASIC METABOLIC PANEL
Anion Gap: 11 (ref 5.0–15.0)
Anion Gap: 10 (ref 5.0–15.0)
BUN: 6 mg/dL — ABNORMAL LOW (ref 7–21)
BUN: 7 mg/dL (ref 7–21)
CO2: 31 meq/L — ABNORMAL HIGH (ref 17–29)
CO2: 26 meq/L (ref 17–29)
Calcium: 9.4 mg/dL (ref 8.5–10.5)
Calcium: 9.4 mg/dL (ref 8.5–10.5)
Chloride: 98 meq/L — ABNORMAL LOW (ref 99–111)
Chloride: 105 meq/L (ref 99–111)
Creatinine: 0.6 mg/dL (ref 0.4–1.0)
Creatinine: 0.5 mg/dL (ref 0.4–1.0)
GFR: >60 mL/min/{1.73_m2} (ref >=60.0)
GFR: >60 mL/min/{1.73_m2} (ref >=60.0)
Glucose: 97 mg/dL (ref 70–100)
Glucose: 112 mg/dL — ABNORMAL HIGH (ref 70–100)
Potassium: 3.3 meq/L — ABNORMAL LOW (ref 3.5–5.3)
Potassium: 3.8 meq/L (ref 3.5–5.3)
Sodium: 140 meq/L (ref 135–145)
Sodium: 141 meq/L (ref 135–145)

## 2023-04-06 LAB — CBC
Absolute nRBC: 0 10*3/uL (ref <=0.00)
Hematocrit: 40.8 % (ref 34.7–43.7)
Hemoglobin: 13.3 g/dL (ref 11.4–14.8)
MCH: 29.2 pg (ref 25.1–33.5)
MCHC: 32.6 g/dL (ref 31.5–35.8)
MCV: 89.7 fL (ref 78.0–96.0)
MPV: 9.6 fL (ref 8.9–12.5)
Platelet Count: 406 10*3/uL — ABNORMAL HIGH (ref 142–346)
RBC: 4.55 10*6/uL (ref 3.90–5.10)
RDW: 12 % (ref 11–15)
WBC: 8.08 10*3/uL (ref 3.10–9.50)
nRBC %: 0 /100{WBCs} (ref <=0.0)

## 2023-04-06 LAB — WHOLE BLOOD GLUCOSE POCT
Whole Blood Glucose POCT: 102 mg/dL — ABNORMAL HIGH (ref 70–100)
Whole Blood Glucose POCT: 122 mg/dL — ABNORMAL HIGH (ref 70–100)
Whole Blood Glucose POCT: 110 mg/dL — ABNORMAL HIGH (ref 70–100)

## 2023-04-06 LAB — MAGNESIUM: Magnesium: 1.8 mg/dL (ref 1.6–2.6)

## 2023-04-06 MED ORDER — SUMATRIPTAN SUCCINATE 50 MG PO TABS
100.0000 mg | ORAL_TABLET | ORAL | Status: DC | PRN
Start: 2023-04-06 — End: 2023-04-07
  Filled 2023-04-06 (×7): qty 2

## 2023-04-06 MED ORDER — UMECLIDINIUM BROMIDE 62.5 MCG/ACT IN AEPB
1.0000 | INHALATION_SPRAY | Freq: Every morning | RESPIRATORY_TRACT | Status: DC
Start: 2023-04-06 — End: 2023-04-07
  Administered 2023-04-06: 1 via RESPIRATORY_TRACT
  Filled 2023-04-06 (×2): qty 7

## 2023-04-06 NOTE — Progress Notes (Signed)
  Palliative Medicine & Comprehensive Care        Palliative Care Progress Note   Date Time: 04/06/23 10:40 AM   Patient Name: [name]   Location: FI300/FI300.01   Attending Physician: Everet Duwaine BRAVO, MD   Primary Care Physician: Michiel Olam RAMAN, FNP   Consulting Provider: Elveria Duncans, NP   Consulting Service: Palliative Medicine and Comprehensive Care  Consult request from Everet Duwaine BRAVO, MD to see patient regarding:   Reason for Referral: Pain          Assessment & Plan   Impression   Valerie May 53 y.o. female with PMHx asthma, eosinophilia, mild bronchiectasis, recurrent pneumonia/bronchitis, migraine headaches, prolonged admit in [place]  for COVID-19 in 2021, sinus disease, GERD, DM2. 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 S/p EGD/EUS 2/26. FNA w/ indeterminate neoplasm. S/p repeat biopsy - unsuccessful. To ICU for extubation. Now awaiting repeat bx. Palliative Care consult for pain.            Recommendations   1. Goals of Care:   Palliative Care is available to participate in family meetings, please page/epic chat/ call to arrange.   Oncology following, plan is for repeat biopsy of neoplasm  Palliative care consulted for pain/symptoms, see below.       ACP Validation: Not addressed  Surrogate Decision Maker: Next of Kin status: children:   ACP Document: None    CODE STATUS: Full Code     2.  Pain:   - Central nociceptive abdominal pain- constant, worse with eating. Per oncology note, the small enhancing lesion she has in the head of pancreas is unlikely the cause of her symptoms. Abdominal pain now improved  Migraines, Chronic MSK pain; RUE/RLE pain chronic, injury related- not managed by palliative care  - Trial non Pharm interventions as indicated (I.e. hot/cold packs; repositioning; activity; spiritual care; relaxation techniques)  Tylenol  PRN mild pain  Sundown'd oxycodone , pt refusing. Rotated morphine  in setting of refractory nausea and concern for opioid related  nausea  S/p Dilaudid  2mg  PO Q 3 hour PRN pain; s/p Dilaudid  0.6mg  IV Q 4 hour PRN breakthrough pain. - now d/c'd   Pt exhibits signs of total pain. Ativan  PRN available as well.   Pain does not appear to be primarily malignancy related and moreso migraine/MSK pain complaints   These medication requires intensive monitoring for drug toxicity and will be monitored for ADE e.g. over somnolence, constipation, respiratory depression.         3. Non-Pain Symptoms:   Constipation, now diarrhea:   Pericolace 2 tab PO qhs  bisacodyl  to prn  Cont Miralax   Hold bowel regimen for loose stool     Nausea/Vomiting:  Hx jitteryness with compazine  S/p Cont scop patch   Cont reglan  QID- hold if diarrhea continues  Zofran  4mg  IV/PO Q 6 hour PRN nausea- note HA side effect. Discontinue if HA's appear associated w zofran   Staff witnessed episode of pt attempting self induced vomiting     Depression/Anxiety:  Continue home regimen (venlafaxine )   Continue Ativan  0.25mg  PO Q 6 hour PRN anxiety  Incorporate non-pharmacological treatment of anxiety when appropriate including emotional support, distraction strategies, and relaxation exercises.          4. Psychosocial: Palliative Care Therapist followed  Pt is single, 4 adult children  Her youngest son lives with her and has needs related to bipolar disorder dx    Case Management would be beneficial to follow along as  pt has housing and financial concerns.      5. Spiritual: Catholic. Palliative Care Chaplain followed  6. PC Team follow-up plans: Will sign off. Pls reconsult if indicated.       Discharge Disposition: TBD      Outpatient Follow Up Recommended: Yes    Outcomes: Improved pain intervention and Improved non-pain symptom therapy    Counseling & coordination of care with Patient, referring provider, and Bedside Nurse with recommendations above.         Elveria Duncans, MSN, AG-ACNP-BC, ANP-BC, [facility]  Palliative Medicine and Comprehensive Care  Spectra : [phone] M-F,  8a-4:30p  Extend  Pager: [phone],    8a-4:30p  Team Phone Number:  [phone], M-F 8a-4:30P  Team Xtend Pager: [phone], 8a-4:30p, Daily     Interval History   Valerie May is a 53 y.o. female admitted to hospital on 03/21/2023 with Generalized abdominal pain [R10.84]     A 10 point ROS was completed and is negative except for as documented     Past Family/Social History reviewed from H+P and unchanged    MAR review: 0 OME    Note: OMEs calculated using updated opioid conversion table Honor ML. Demystifying Opioid Conversion Calculations, A Guide for Effective Dosing, 2nd Edition. [employer]; 2018.)    Pt awake in bed. Generalized pain c/o in head, body. + Diarrhea. Wants to eat a diet, waiting for biopsy. Hopeful to go home soon.     Palliative Care will sign off, pls reconsult if indicated.        Goals of Care   CURRENT CPR Status: Full Code          [place]  Health Care Decisions Act: Procedure in absence of an advance directive ([id number])  1. A guardian for the patient. This subdivision shall not be construed to require such appointment in order that a health care decision can be made under this section; or  2. The patient's spouse except where a divorce action has been filed and the divorce is not final; or  3. An adult child of the patient; or  4. A parent of the patient; or  5. An adult brother or sister of the patient; or  6. Any other relative of the patient in the descending order of blood relationship; or  7. Except in cases in which the proposed treatment recommendation involves the withholding or withdrawing of a life-prolonging procedure, any adult, except any interior and spatial designer, employee, or agent of a health care provider currently involved in the care of the patient, who (i) has exhibited special care and concern for the patient and (ii) is familiar with the patient's religious beliefs and basic values and any preferences previously expressed by the patient regarding health care, to the extent that they are known.  Source:  https://law.[place] .gov/vacode/title54.1/chapter29/section54.01-2984/        Palliative Functional and Symptom Assessment     Palliative Performance Scale: 70% - Reduced ambulation, unable to do normal work, some evidence of disease, full self-care, normal or reduced intake, full LOC    [place] Symptom Assessment Scale (ESAS): Completed       Medications   Scheduled Meds  Current Facility-Administered Medications   Medication Dose Route Frequency    aluminum & magnesium  / diphenhydrAMINE  / lidocaine   15 mL Oral QID    aspirin  EC  81 mg Oral Daily    budesonide -formoterol   2 puff Inhalation BID    cetirizine   10 mg Oral Daily    enoxaparin   40 mg Subcutaneous  Daily    fluconazole   400 mg Oral Q24H SCH    fluocinonide    Topical BID    insulin  lispro  1-4 Units Subcutaneous QHS    insulin  lispro  1-8 Units Subcutaneous TID AC    lidocaine   2 patch Transdermal Q24H    metoclopramide   5 mg Oral QID    montelukast   10 mg Oral QHS    pantoprazole   40 mg Oral Q12H SCH    polycarbophil  625 mg Oral BID    polyethylene glycol  17 g Oral Daily    senna-docusate  2 tablet Oral QHS    venlafaxine   25 mg Oral TID MEALS      DRIPS     PRN MEDS  PRN Medications[1]    Allergies   Allergies[2]    Physical Exam   BP 114/89   Pulse 88   Temp 97.4 F (36.3 C) (Temporal)   Resp (!) 26   Ht 1.499 m (4' 11)   Wt 79.5 kg (175 lb 4.3 oz)   LMP 03/18/2012   SpO2 95%   BMI 35.40 kg/m    Physical Exam:  General: woman in bed in  no acute distress   HEENT:  EOMI, sclera anicteric, OP/OC clear  Neck: supple, FROM   CV: regular rate and rhythm  Lungs:unlabored, no accessory muscle use  Abd: soft, NT, ND, +bs  Ext: no clubbing, cyanosis  Neuro: awake, alert, oriented  Psych:  unable to assess  Skin: no rashes or lesions noted     Labs / Radiology   Lab and diagnostics: reviewed in Epic  Recent Labs   Lab 04/06/23  0422   WBC 8.08   Hemoglobin 13.3   Hematocrit 40.8   Platelet Count 406*              Recent Labs   Lab 04/06/23  0422    Sodium 140   Potassium 3.3*   Chloride 98*   CO2 31*   BUN 6*   Creatinine 0.6   GFR >60.0   Glucose 97   Calcium  9.4     Recent Labs   Lab 04/04/23  0352   Bilirubin, Total 0.4   Bilirubin Direct 0.2   Protein, Total 6.6   Albumin 3.3*   ALT 132*   AST (SGOT) 70*          XR Chest AP Portable    Result Date: 04/04/2023   No acute abnormality within the chest. Elwood Miyamoto, MD 04/04/2023 10:14 AM    XR Chest AP Portable    Result Date: 04/03/2023  1. Endotracheal tube in good position. 2. Mild hypoinflation with central vascular prominence and peribronchial thickening. 3. Remainder as above. Wolm Luis, MD 04/03/2023 12:08 PM                  [1]   Current Facility-Administered Medications   Medication Dose    acetaminophen   650 mg    albuterol   2.5 mg    alum & mag hydroxide-simethicone   30 mL    bengay greaseless      benzocaine -menthol   1 lozenge    benzonatate   200 mg    bisacodyl   10 mg    butalbital -acetaminophen -caffeine   1 tablet    carboxymethylcellulose sodium  1 drop    dextrose   15 g of glucose    Or    dextrose   12.5 g    Or    dextrose   12.5 g  Or    glucagon  (rDNA)  1 mg    ketorolac   30 mg    lidocaine  viscous  5 mL    Lubrifresh PM      magnesium  sulfate  1 g    naloxone   0.2 mg    ondansetron   8 mg    Or    ondansetron   4 mg    potassium & sodium phosphates   2 packet    potassium chloride   0-60 mEq    Or    potassium chloride   0-60 mEq    Or    potassium chloride   10 mEq    saline  2 spray    traMADol   50 mg   [2]   Allergies  Allergen Reactions    Benadryl  [Diphenhydramine ]     Dust Mite Extract     Methocarbamol Itching    Molds & Smuts     Other     Compazine [Prochlorperazine] Anxiety

## 2023-04-06 NOTE — Consults (Signed)
 INITIAL INPATIENT PULMONARY CONSULTATION  [phone] (MD Spectralink #1) / [phone] (MD Spectralink #2) / [phone] (PA Spectralink)      Date Time: 04/06/23 1:03 PM  Patient Name: Valerie May  Requesting Physician: Everet Duwaine BRAVO, MD  Consulting Physician: Sheril JAYSON Chang, PA  Outpatient Pulmonologist (if known):        Reason for Consultation:   Asthma      Assessment:     Problem List[1]    1.  Acute hypoxic and hypercarbic respiratory failure: Initially thought to be due to aspiration pneumonia, possible OHS and IV narcotics given in the ER for intractable abdominal pain  -Initial ABG 7.2 4/65/28   -on admission labs notable for elevated hematocrit of 46.4 with a white count of 17.25  -Required intubation and was unable to be extubated after EGD/EUS 3/7, eventually extubated the same day     2.  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  Possible OSA/OHS  -elevated hematocrit of 46.4 on admit    4.  Pancreatic head mass  -Tumor markers not elevated    5. 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 Also past history of Tb though no definite record of this    6.  COVID-19 pneumonia 2021    7.  Prior hernia repair, cholecystectomy, hysterectomy    8.  Candida esophagitis on fluconazole     9. GERD    10. Possible Tb in past-not fully treated per pt but no sign of active Tb at this time  -no mention of this in any of the other records available for this pt    Recommendations:     Will need eventual sleep study   Agree with continuing Symbicort  (160) 2 puffs twice daily and Incruse  No need for systemic steroids  On fluconazole  for Candida esophagitis  Hepatopancreatic biliary surgery team and GI team following  Palliative care following for  abdominal pain  Repeat EGD/EUS planned for tomorrow, would recommend BiPAP initially postprocedure\  will   Will follow thx        The case was discussed with Dr. Hendricks and the patient. I personally spent 35 minutes with the patient at bedside today.    The patient presents complex issues, as noted above, and requires continued hospitalization and monitoring given the risk of deterioration.      Thank you for allowing us  to participate in the care of this patient. We will follow along with you.     Varin C Bird, [place]  04/06/23  1:03 PM      History:   Valerie May is a 53 y.o. female admitted on 03/21/2023.  We have been asked by Terek, Megan E, MD, to provide pulmonary consultation regarding asthma.      She has a history of asthma, COVID-19 in 2021, GERD, obesity and sinusitis presented to the ER on 03/20/2023 with nausea, vomiting and abdominal pain.  Initial CBC showed a white count of 17.25, elevated hematocrit of 46.4, eosinophils 390.  Initial chest x-ray of the ER without infiltrate.  She was given IV narcotics.  She developed hypoxia and altered mental status.  ABG at the time with 7.24/65/28.  CTA chest without obvious PE.  Anterior bilateral upper lobe scarring with reticulation  and mild bronchiectasis.      CT abdomen/pelvis showed hyperdense 1.2 cm lesion within the pancreatic head.  GI was consulted and she underwent an EGD/EUS with fine-needle biopsy on 03/25/2023, extubated on the CPAP.  Path report was suspicious for neuroendocrine neoplasm but indeterminant.  She went repeat EGD for repeat biopsy 04/03/2023 but could not be completed due to large amount of food within the stomach and anesthesia was unable to extubate her due to elevated ETCO2 with hypoventilation.  Chest x-ray with mild hyperinflation, edema and peribronchial thickening.      She was admitted to the ICU for presumed aspiration and underlying OHS/OSA.  Course has been further complicated by Candida esophagitis, she has been on  fluconazole .  Hepatopancreatic biliary surgery team was consulted.  She was eventually extubated 3/7 and and is on 2 L NC today.  Downgraded to the floor and awaiting bed.    She has been followed by a pulmonologist Dr. Jacquelynne.  Last seen in September 2024.  He had diagnosed her with probable asthma.  Per patient she had a  rescue inhaler which was red at home, and a green inhaler, thought to be Incruse.  She grew up in [place], came to [place]  [place] age 89.        Denies history of OSA and does not have a CPAP at home.  She has a history of COVID-pneumonia in 2021 which was treated in [place] , was briefly on home O2 after that.    Of note, pt admits to being treated for Tb when she was 19. She says she probably did not finish treatment. Pt has not had symptoms consistent with active Tb here. No record of this in her chart and she did not tell us  about it initially but says she remembered it later. Not sure how accurate this is        Past Medical History:     Past Medical History:   Diagnosis Date    COVID-19     COVID-19 2021    Heart valve problem     Kidney stone     Pneumonia        Past Surgical History:   Past Surgical History[2]    Family History:   Family History[3]    Social History:     Social History[4]       Allergies:   Allergies[5]    Medications:     Medications Prior to Admission   Medication Sig    acetaminophen  (TYLENOL ) 650 MG CR tablet TAKE 1 TABLET BY MOUTH EVERY 8 HOURS AS NEEDED FOR PAIN (Patient taking differently: every 8 (eight) hours as needed)    albuterol  sulfate HFA (PROVENTIL ) 108 (90 Base) MCG/ACT inhaler Inhale 1-2 puffs into the lungs every 4 (four) hours as needed    benzonatate  (TESSALON ) 200 MG capsule Take 1 capsule (200 mg) by mouth 3 (three) times daily as needed for Cough    Budeson-Glycopyrrol-Formoterol  160-9-4.8 MCG/ACT Aerosol Inhale 2 puffs into the lungs 2 (two) times daily    budesonide -formoterol  (SYMBICORT ) 160-4.5 MCG/ACT inhaler Inhale 2 puffs into the  lungs 2 (two) times daily as needed    cetirizine  (ZyrTEC ) 10 MG tablet Take 10 mg by mouth daily (Patient not taking: Reported on 12/09/2022)    [employer] Aspirin  Low Dose 81 MG EC tablet Take 1 tablet (81 mg) by mouth daily    diphenhydrAMINE -APAP, sleep, (Excedrin PM) 38-500 MG Tab Take by mouth daily    famotidine  (PEPCID )  10 MG tablet Take 10 mg by mouth 2 (two) times daily (Patient not taking: Reported on 12/09/2022)    metFORMIN (GLUCOPHAGE) 500 MG tablet Take 1 tablet (500 mg) by mouth 2 (two) times daily with meals    methocarbamol (ROBAXIN) 750 MG tablet as needed    montelukast  (SINGULAIR ) 10 MG tablet Take 1 tablet (10 mg) by mouth nightly    naproxen  (EC NAPROSYN ) 500 MG EC tablet Take 1 tablet (500 mg) by mouth 2 (two) times daily with meals (Patient not taking: Reported on 12/09/2022)    ondansetron  (ZOFRAN ) 8 MG tablet Take by mouth every 8 (eight) hours as needed for Nausea (Patient not taking: Reported on 12/09/2022)    pantoprazole  (PROTONIX ) 40 MG tablet TAKE 1 TABLET BY MOUTH EVERY DAY (Patient taking differently: as needed)    vitamin D , ergocalciferol , (DRISDOL ) 50000 UNIT Cap Take 1 capsule (50,000 Units total) by mouth once a week    [DISCONTINUED] ofloxacin  (FLOXIN ) 0.3 % otic solution Place 5 drops into both ears daily (Patient not taking: Reported on 12/09/2022)    [DISCONTINUED] pregabalin  (LYRICA ) 75 MG capsule Take 1 capsule (75 mg) by mouth 2 (two) times daily (Patient not taking: Reported on 12/09/2022)    [DISCONTINUED] venlafaxine  (EFFEXOR -XR) 75 MG 24 hr capsule TAKE 1 CAPSULE BY MOUTH EVERY DAY (Patient not taking: Reported on 12/09/2022)        Current Facility-Administered Medications   Medication Dose Route Frequency    budesonide -formoterol   2 puff Inhalation BID    cetirizine   10 mg Oral Daily    enoxaparin   40 mg Subcutaneous Daily    fluconazole   400 mg Oral Q24H SCH    fluocinonide    Topical BID    lidocaine   2 patch Transdermal Q24H    montelukast   10 mg Oral QHS     pantoprazole   40 mg Oral Q12H SCH    umeclidinium bromide   1 puff Inhalation QAM    venlafaxine   25 mg Oral TID MEALS         Review of Systems:    Comprehensive review of systems including constitutional, eyes, ears, nose, mouth, throat, cardiovascular, GI, GU, musculoskeletal, integumentary, respiratory, neurologic, psychiatric, and endocrine is negative other than what is mentioned already in the history of present illness    Physical Exam:     VITAL SIGNS PHYSICAL EXAM   Vitals:    04/06/23 0800   BP: 114/89   Pulse: 88   Resp: (!) 26   Temp: 97.4 F (36.3 C)   SpO2: 95%     Temp (24hrs), Avg:97.8 F (36.6 C), Min:97.4 F (36.3 C), Max:98.6 F (37 C)        Intake/Output Summary (Last 24 hours) at 04/06/2023 1303  Last data filed at 04/06/2023 1000  Gross per 24 hour   Intake 387 ml   Output 1430 ml   Net -1043 ml           SpO2: 95 % (04/06/2023  8:00 AM)  O2 Device: None (Room air) (04/06/2023  8:00 AM)  FiO2 (%): 0 % (2L bleed in) (04/06/2023 12:15 AM)  O2 Flow Rate (L/min): 4 L/min (04/05/2023 11:00 PM)  Oximetry Probe Site Changed: No (04/06/2023  1:00 AM)     Physical Exam  General: awake, alert, breathing comfortably, no acute distress  Head: normocephalic  Eyes: EOM's intact  Cardiovascular: regular rate and rhythm, normal S1, S2, no S3, no S4, no murmurs, rubs or gallops  Neck: No JVD  Lungs: Good air movement, no wheeze or rhonchi, good inspiratory effort, no crackles  Abdomen: soft, non-tender, non-distended; no palpable masses,  normoactive bowel sounds  Extremities: no edema  Pulse: equal pulses,  Neurological: Alert and oriented X3, mood and affect normal       Labs Reviewed:     Results       Procedure Component Value Units Date/Time    Whole Blood Glucose POCT [[id number]]  (Abnormal) Collected: 04/06/23 1113    Specimen: Blood, Capillary Updated: 04/06/23 1115     Whole Blood Glucose POCT 122 mg/dL     Chromogranin A [[id number]] Collected: 04/03/23 0302    Specimen: Blood, Venous Updated: 04/06/23  0929     Chromogranin A, S 43 ng/mL     Whole Blood Glucose POCT [[id number]]  (Abnormal) Collected: 04/06/23 0654    Specimen: Blood, Capillary Updated: 04/06/23 0715     Whole Blood Glucose POCT 102 mg/dL     Basic Metabolic Panel [[id number]]  (Abnormal) Collected: 04/06/23 0422    Specimen: Blood, Venous Updated: 04/06/23 0512     Glucose 97 mg/dL      BUN 6 mg/dL      Creatinine 0.6 mg/dL      Calcium  9.4 mg/dL      Sodium 859 mEq/L      Potassium 3.3 mEq/L      Chloride 98 mEq/L      CO2 31 mEq/L      Anion Gap 11.0     GFR >60.0 mL/min/1.73 m2     CBC without Differential [[id number]]  (Abnormal) Collected: 04/06/23 0422    Specimen: Blood, Venous Updated: 04/06/23 0504     WBC 8.08 x10 3/uL      Hemoglobin 13.3 g/dL      Hematocrit 59.1 %      Platelet Count 406 x10 3/uL      MPV 9.6 fL      RBC 4.55 x10 6/uL      MCV 89.7 fL      MCH 29.2 pg      MCHC 32.6 g/dL      RDW 12 %      nRBC % 0.0 /100 WBC      Absolute nRBC 0.00 x10 3/uL     Whole Blood Glucose POCT [[id number]]  (Normal) Collected: 04/05/23 2104    Specimen: Blood, Capillary Updated: 04/05/23 2105     Whole Blood Glucose POCT 92 mg/dL     Whole Blood Glucose POCT [[id number]]  (Abnormal) Collected: 04/05/23 1629    Specimen: Blood, Capillary Updated: 04/05/23 1631     Whole Blood Glucose POCT 149 mg/dL     Cancer Antigen 19-9 [[id number]] Collected: 04/03/23 0302    Specimen: Blood, Venous Updated: 04/05/23 1336     Cancer Antigen 19-9 15 U/mL             I personally reviewed the labs         Imaging:     Outside imaging and imaging available in Epic were directly reviewed        Radiology Results (24 Hour)       ** No results found for the last 24 hours. **          CXR 03/21/23-  No acute pulmonary process     CT chest 03/21/23  1.No acute abnormality within the chest, abdomen, or pelvis.  2.No findings of acute pulmonary embolism within the limitations stated  above.  3.Hyperdense pancreatic head lesion.  Further evaluation with MRI/MRCP  is  recommended.  4.Mildly hyperdense right renal lesion, possible hemorrhagic/proteinaceous  cyst. This can also be further evaluated on MRI.  5. Anterior bilateral upper lobe scarring with reticulation and associated bronchiectasis. No consolidation,  pleural effusion, or pneumothorax.     CXR 04/04/23  No acute abnormality within the chest.     Signed by: Varin C Bird, PA      [facility]  Spectra  [phone]                [1]   Patient Active Problem List  Diagnosis    GERD (gastroesophageal reflux disease)    Hypoxemia    Intractable chronic migraine without aura    Lumbar spondylosis    Obstructive sleep apnea (adult) (pediatric)    Unspecified astigmatism, bilateral    Uterine leiomyoma    Polycythemia    Vitamin D  deficiency    Ischemic cerebrovascular accident (CVA) (CMS/HCC)    Severe persistent asthma without complication (CMS/HCC)    Eosinophilia    Bronchiectasis without complication (CMS/HCC)    Personal history of COVID-19    Personal history of pneumonia (recurrent)    Abnormal chest CT    Chronic cough    Generalized abdominal pain    Pancreatic mass    Nausea & vomiting   [2]   Past Surgical History:  Procedure Laterality Date    CYST REMOVAL      ESOPHAGOGASTRODUODENOSCOPY (EGD), ESOPHAGEAL ULTRASOUND (EUS) WITH FINE NEEDLE ASPIRATION/BIOPSY OF ESOPHAGUS, STOMACH, OR DUODENUM N/A 03/25/2023    Procedure: ESOPHAGOGASTRODUODENOSCOPY (EGD), ESOPHAGEAL ULTRASOUND (EUS) WITH FINE NEEDLE ASPIRATION/BIOPSY OF ESOPHAGUS, STOMACH, OR DUODENUM;  Surgeon: Abagail Carole HERO, MD;  Location: [facility];  Service: Gastroenterology;  Laterality: N/A;    ESOPHAGOGASTRODUODENOSCOPY (EGD), ESOPHAGEAL ULTRASOUND (EUS) WITH FINE NEEDLE ASPIRATION/BIOPSY OF ESOPHAGUS, STOMACH, OR DUODENUM N/A 04/03/2023    Procedure: ESOPHAGOGASTRODUODENOSCOPY (EGD), ESOPHAGEAL ULTRASOUND (EUS) WITH FINE NEEDLE ASPIRATION/BIOPSY OF ESOPHAGUS, STOMACH, OR DUODENUM;  Surgeon: Ulysess Foots, MD;  Location: [facility];  Service:  Gastroenterology;  Laterality: N/A;    GALLBLADDER SURGERY      HERNIA REPAIR      HYSTERECTOMY      TUBAL LIGATION     [3]   Family History  Problem Relation Name Age of Onset    Heart attack Mother      Diabetes Father      Heart attack Maternal Grandfather      Heart attack Paternal Grandfather      Stroke Son      Heart attack Maternal Uncle      Stomach cancer Cousin      Bone cancer Maternal Aunt      Colon cancer Neg Hx      Breast cancer Neg Hx     [4]   Social History  Tobacco Use    Smoking status: Never     Passive exposure: Current    Smokeless tobacco: Never   Vaping Use    Vaping status: Never Used   Substance Use Topics    Alcohol  use: Not Currently    Drug use: Never   [5]   Allergies  Allergen Reactions    Benadryl  [Diphenhydramine ]     Dust Mite Extract     Methocarbamol Itching    Molds & Smuts     Other     Compazine [Prochlorperazine] Anxiety

## 2023-04-06 NOTE — Transfer Summary (Signed)
 Transfer Summary      Patient Name: Valerie May   Medical Record Number: [id number]   Date of Birth: 02/02/1970      Date of Admission:   03/21/2023  4:55 AM    Date of Transfer:   04/06/2023 5:54 PM    HPI   53 y.o. female w/ PMHx asthma, eosinophilia, bronchiectasis, recurrent PNA, GERD, DM2, polycythemia, obesity. Admitted to hospital on 2/22 w/ N&V, abdominal pain and altered mental status. Noted to be hypercarbic on VBG, [facility] consulted, recommended to start patient on nocturnal CPAP for OHS and avoid narcotics. 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 GI consulted. EGD / EUS w/ fine needle biopsy performed on 2/26. During this procedure te patient was extubated to CPAP w/ NPA. 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 Oncology recommended to repeat biopsy. Today patient was taken for repeat EGD for EUS w/ biopsy but could not be completed due to large amount of food in the stomach. Anesthesia was unable to extubate her due to low Vt and high ETCO2. Called [facility] for admission.     Hospital Course:     3/7: Extubated in CICU  3/8: downgraded to floor, awaiting bed. Lasix  20mg  X 1   3/9: tolerating clears.        Transfer Summary:      Assessment:   Hx of migraines, acute headaches likely multifactorial with n/v and poor PO intake   Acute on chronic hypercarbic respiratory failure   Concern for OSA/OHS  Concern for eosinophilic asthma   Pancreatic mass   Esophageal Candida   N/V  Diarrhea, improved     Plan:   Oob as tolerated   PRN tylenol , fiorcet and IV toradol . Limiting narcotics. Added on PRN sumatriptan .   Room air while awake  C/w CPAP nightly   Started on home Incruse   General pulm following  Clear liquid diet. NPO at [facility]. Repeat EGD/EUS today 3/11 for biopsy.   Bowel regimen stopped for on going diarrhea   Completed course of fluconazole    Oncology following - previous biopsy suspicious for NEN, acinar, cell carcinoma, or solid pseudopapillary neoplasm,  requesting repeat biopsy and pathology to drive management     Consultants:   Oncology   GI  Palliative for symptom/pain management     Patient transferred to hospitalist Dr. Joesph        Signed by: Valerie JONETTA Eans, NP  04/06/2023 5:54 PM

## 2023-04-06 NOTE — Progress Notes (Signed)
 Cardiac Intensive Care Unit (CICU)  Progress Note      Patient Name: Valerie May  MRN: [id number]  Room: FI300/FI300.01    ICU Day: Patient does not have an ICU stay during this admission.    Attending Attestation:     Attending Statement      As the physician signing this note, I have reviewed the history and exam and pertinent imaging results. My addendum revises, addends or confirms the recommendations or plans in this note. I discussed with the plan of care with the NPP/Resident and I agree with the findings noted unless revised or updated in my notations. I formulated all of the medical decision making noted in the assessment & plan.    This patient is critically ill with life-threatening condition(s) and a high probability of sudden clinically significant deterioration due to the condition(s) noted in the assessment and plan, which requires the highest level of physician's / advance practice provider's preparedness to intervene urgently.  Full attention to the direct care of this patient was provided for the period of time noted, which includes review of laboratory data, radiology results, discussion with consultant(s) monitoring for potential decompensation and performing a history exam and medical decision making.  All critical care time personally performed today was exclusive of teaching, billable procedures, and not overlapping with any other physicians or advance practice providers.    Patient extubated shortly after arrival to CICU 3/7 following previous biopsy attempt, has remains stable and awaiting floor bed  PO clear diet  GI plan for repeat EGD/ EUS in OR 3/11. NPO after midnight    Start Incruse, history of eosinophilic asthma  Will need outpatient sleep study  Appreciate pulmonology consultation  OK from [facility] standpoint for transfer to floor, awaiting bed availability    I have personally examined the patient. I agree with the findings noted in the non-physician provider note and have  reviewed the patient's history, exam, laboratory findings, imaging results, monitoring data for potential decompensation, and additional findings found in detail within the team notes. Numerous life or organ-supporting interventions have been undertaken in addition to discussion with the consultant(s) and clinical staff, including advance practice providers, critical care fellows, house staff, and nurses, as well as counseling the patient/family members (as described in the note). I formulated all the medical decision-making and coordinated care as stated in the Assessment & Plan in discussion with the non-physician provider. Independent non-physician provider (NPP) time is indicated by the minutes as noted in the NPP note.        Physician care time (independent): 15 minutes   Total rounding/joint time:  15 minutes   Total critical care time: 74 minutes   Attending Physician: Duwaine FORBES Derby, MD     Substantive Service provided by   Physician or NPP   (provided > 50% of total care time): Non-physician provider (PA/NP)                                    Interval Events   24 hour events: no events overnight, continues to have intermittent HA and nausea     Subjective   Hospital Course:  53 y.o. female w/ PMHx asthma, eosinophilia, bronchiectasis, recurrent PNA, GERD, DM2, polycythemia, obesity. Admitted to hospital on 2/22 w/ N&V, abdominal pain and altered mental status. Noted to be hypercarbic on VBG, [facility] consulted, recommended to start patient on nocturnal CPAP for OHS and  avoid narcotics. 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 GI consulted. EGD / EUS w/ fine needle biopsy performed on 2/26. During this procedure te patient was extubated to CPAP w/ NPA. 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 Oncology recommended to repeat biopsy. Today patient was taken for repeat EGD for EUS w/ biopsy but could not be completed due to large amount of food in the stomach.  Anesthesia was unable to extubate her due to low Vt and high ETCO2. Called [facility] for admission.     3/7: Extubated in CICU  3/8: downgraded to floor, awaiting bed. Lasix  20mg  X 1   3/9: tolerating clears.         Assessment & Plan   Assessment and Plan   Summary: INDONESIA May a 53 y.o. female who presents following EGD unable to extubate due to acute hypercarbic respiratory failure     System (Problem List) Plan   NEUROLOGICAL:  Acute metabolic encephalopathy - resolved  Migraines       Current PT Order: Yes  Current OT Order: Yes PRN Tylenol , Fioricet , Toradol  for pain  Delirium precautions including maintenance of day/night wake cycles, frequent reorientation as needed  Physical therapy/Occupational Therapy/Out of bed to chair, as indicated/tolerated  Avoiding narcotics given concern for gastroparesis   Previously on sumatriptan  as outpatient    CARDIOVASCULAR:   NAI    Ejection Fraction          03/21/2023    13:52   Ejection Fraction   Ejection Fraction 63       Echo from Feb 2025 - EF 63%, normal function  Currently meeting BP goals   Blood Pressure Goal: MAP > 65, SBP  < 160   PULMONARY:   Acute Hypoxic respiratory failure, improved   Acute on chronic hypercarbic respiratory failure, improved   OHS  Eosinophilic asthma?    Last vent settings if on vent:  PEEP/EPAP:  [8 cm H20] 8 cm H20  Ventilator Time (if on vent):   /2d 18h    Room air while awake  CPAP QHS for sleep apnea  Supplemental oxygen as needed to keep sat > 90%  Resume home Incruse for eosinophilic asthma   No indication for steroids   Engage pulm   Diuresing PRN    RENAL:   NAI      Intake/Output Summary (Last 24 hours) at 04/06/2023 0749  Last data filed at 04/05/2023 2000  Gross per 24 hour   Intake 887 ml   Output 1380 ml   Net -493 ml      Spot diuresis PRN   Strict I/O  Daily weights   Replace lytes as needed   GASTROINTESTINAL:  GERD  Pancreatic Mass   Candida Esophagitis likely secondary to steroid inhaler   N/V  Diarrhea   Concern for  Gastroparesis       Last Bowel Movement:   Last BM Date: 04/05/23   Prior EGD aborted d/t food presence in stomach   Plan for repeat EGD with biopsy 3/11  Nutrition: clear liquid diet until repeat EGD (NPO at [facility])  Stop bowel regimen, fibercon and flagyl given on going diarrhea   PRN zofran  and maylox   Continue fluconazole  (stop tomorrow, 2 weeks)  Continue with daily LFTs   Hepatic steatosis on MRI      INFECTIOUS DISEASE:   No acute issues    Temp (24hrs), Avg:98 F (36.7 C), Min:97.6 F (36.4 C), Max:98.6 F (  37 C)     1 mL  Lab Results   Component Value Date    WBC 8.08 04/06/2023      CAUTI Prevention (n/a if blank):  Foley Day:         Blood Steam Infection Prevention (n/a if blank):      Last culture dates: n/a           HAI Prevention Plan  N/A - No indwelling foley catheter  N/A - No central line/PICC in place       HEME/ONC:   Pancreatic mass      Oncology consulted - previous biopsy suspicious for NEN, acinar, cell carcinoma, or solid pseudopapillary neoplasm, requesting repeat biopsy and pathology to drive management    SCDs  DVT chemoprophylaxis: enoxaparin  (LOVENOX ) syringe 40 mg    ENDO/RHEUM:   DM2    Goal glucose range 100 - 180       Current Infusions (n/a if blank):            Advance Care Planning: Open ACP Navigator  Code Status: Full Code  Next of Kin:  Primary Emergency Contact: Hodzic,Sanros Dorn           ADVANCE CARE PLAN                     Objective      Examination:   Temp:  [97.6 F (36.4 C)-98.6 F (37 C)] 97.6 F (36.4 C)  Heart Rate:  [76-125] 83  Resp Rate:  [22-41] 23  BP: (103-127)/(50-82) 120/82  Neuro:    Awake and alert, oriented   No focal deficits     Lungs:   Clear to auscultation bilaterally     Cardiac:   Tachycardia on monitor  Regular rhythm     Abdomen:    Soft, global tenderness on palpation      Extremities:   peripheral pulses normal no pedal edema    Skin:     Warm, dry and intact   Fick/Cardiac Hemodynamics   VO2 Determination  Height: 149.9 cm (4'  11)                   SVR Determination  MAP (mmHg): 97       Most Recent Labs  8.08 \ 13.3 / 406*    / 40.8 \    03/10 0422    140 98* 6* / 97   3.3* 31* 0.6 \    03/10 0422              Additional Diagnoses:     Patient has a BMI of 35.4 kg/m2    Diagnosis: Class 2 Obesity: BMI of 35 to 39.9                             I have personally assessed the patient and based my assessment and medical decision-making on a review of the patient's history and 24-hour interval events along with medical records, physical examination, vital signs, analysis of recent laboratory results, evaluation of radiology images, and monitoring data. The findings and plan of care was discussed with the care team.      Total care time: 44 minutes    Morene JONETTA Eans, NP-C  04/06/2023 7:49 AM

## 2023-04-06 NOTE — OT Plan of Care Note (Addendum)
 [facility]   Occupational Therapy Cancellation Note      Patient:  Valerie May MRN#:  [id number]  Unit:  [facility] VASCULAR CICU 3RD FLOOR Room/Bed:  FI300/FI300.01    04/06/2023  Time: 12:33 PM       OT Cancellation: Visit  OT Visit Cancellation Reason: Not clinically indicated at this time (comment required) - Rehab decision         Per RN patient is independent with no acute OT/PT needs. Will complete OT orders. Thanks. Please reconsult with any changes.     Lum Greet, OTR/L   Pager [phone]

## 2023-04-06 NOTE — PT Progress Note (Signed)
 [facility]   Physical Therapy Cancellation Note      Patient:  Valerie May MRN#:  [id number]  Unit:  [facility] AND VASCULAR CICU 3RD FLOOR Room/Bed:  FI300/FI300.01    04/06/2023  Time: 3:26 PM       PT Cancellation: Visit  PT Visit Cancellation Reason: Not clinically indicated at this time - Rehab decision. Pt is at/near independent baseline per RN. Will d/c inpatient PT services at this time. Please reconsult if there is change in pt's functional status. Thank you.     Lucie Mace  DPT, PT  Pager# [phone]

## 2023-04-06 NOTE — Student Progress (Signed)
 Cardiac Intensive Care Unit (CICU)  Progress Note      Patient Name: Valerie May  MRN: [id number]  Room: FI300/FI300.01    ICU Day:     Attending Attestation:       As the physician signing this note, I have reviewed the history and exam and pertinent imaging results. My addendum revises, addends or confirms the recommendations or plans in this note. I discussed with the plan of care with the APP/Resident and I agree with the findings noted unless revised or updated in my notations. I formulated all of the medical decision making noted in the assessment & plan.         Interval Events   24 hour events: Pt has had five large liquid stools. Refusing PO meds this morning due to nausea.     Subjective   Hospital Course:  Valerie May is a 53 y.o. female w/ PMHx asthma, eosinophilia, bronchiectasis, recurrent PNA, GERD, DM2, polycythemia, obesity. Admitted to hospital on 2/22 w/ N&V, abdominal pain and altered mental status. Noted to be hypercarbic on VBG, [facility] consulted, recommended to start patient on nocturnal CPAP for OHS and avoid narcotics. 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 GI consulted. EGD / EUS w/ fine needle biopsy performed on 2/26. During this procedure te patient was extubated to CPAP w/ NPA. 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 Oncology recommended to repeat biopsy. Today patient was taken for repeat EGD for EUS w/ biopsy but could not be completed due to large amount of food in the stomach. Anesthesia was unable to extubate her due to low Vt and high ETCO2. Called [facility] for admission.      3/7: Transferred to CICU post EGD and aborted biopsy due to large amount of food in stomach. Extubated in CICU  3/8: Downgraded to floor, awaiting bed availability. Lasix  20mg  X 1   3/9: EGD aborted due to food in stomach  3/10: multiple episodes of emesis       Assessment & Plan   Assessment and Plan   Summary: Valerie May a 53 y.o. female who  presents following EGD unable to extubate in GI due to acute hypercarbic respiratory failure, low tidal volumes, and elevated CO2. She remains in ICU awaiting downgrade pending bed availability.    System (Problem List) Plan   NEUROLOGICAL:  Migraines, hx  Body pain  Acute metabolic encepalopathy (resolved)    Current PT Order: Yes  Current OT Order: Yes Migraines  Pt reported migraine this morning, took IV toradol  but did not want PO meds  PRN Fioricet , tramadol , toradol , APAP for migraine tx    Physical therapy/Occupational Therapy/Out of bed to chair, as indicated/tolerated   CARDIOVASCULAR:   No active issues    Ejection Fraction          03/21/2023    13:52   Ejection Fraction   Ejection Fraction 63     Echo 2/25   Stop ASA, not indicated at this time  Maintain MAP > 65 mmHg, SBP < 160   PULMONARY:   Acute hypoxic respiratory failure  Acute on chronic hypercarbic respiratory failure  Obesity hypoventilation  Obstructive sleep apnea  Eosinophilic asthma, hx    Last vent settings if on vent:  PEEP/EPAP:  [8 cm H20] 8 cm H20  Ventilator Time (if on vent):   2d 22h  Acute hypoxic respiratory failure  Acute on chronic hypercarbic respiratory failure  Obesity hypoventilation  OSA  Elevated bicarb in setting of chronic hypercarbia  Supplemental oxygen as needed to keep sat > 90%  CPAP overnight as needed, PEEP 8    Eosinophilic asthma, hx  Continue Symbicort , cetirizine , Singulair  as scheduled  Start Incruse Ellipta  inhaler once daily  Will consult [facility] for further pulm/autoimmune work up     RENAL:   Risk for electrolyte imbalance      Intake/Output Summary (Last 24 hours) at 04/06/2023 1148  Last data filed at 04/06/2023 1000  Gross per 24 hour   Intake 687 ml   Output 1430 ml   Net -743 ml    Cr stable 0.6  Maintain euvolemia to slightly negative  Having multiple large liquid stools/GI water  loss  Monitor BMP/Mg once per shift  Electrolyte protocol ordered   GASTROINTESTINAL:  Pancreatic Mass  N/V,  abdominal pain  Candida esophagitis    Last Bowel Movement:   Last BM Date: 04/06/23 Pancreatic mass  EGD 3/9 aborted d/t presence of stomach contents, rescheduled to 3/11  NPO at midnight for EGD  Tumor markers negative from EGD and fine needle biopsy from 2/26    N/V, abdominal pain  Nutrition: clear liquid diet, clear Ensure, Gelatin 20  Pt refusing PO meds due to nausea and abd pain this AM, given ondansetron  and toradol     Candida esophagitis  Likely secondary to inhaled corticosteroids (Symbicort )  Continue fluconazole  until 3/12           INFECTIOUS DISEASE:   No active issues    Temp (24hrs), Avg:97.9 F (36.6 C), Min:97.4 F (36.3 C), Max:98.6 F (37 C)     1 mL  Lab Results   Component Value Date    WBC 8.08 04/06/2023      CAUTI Prevention (n/a if blank):  Foley Day:         Blood Steam Infection Prevention (n/a if blank):    Monitor CBC with daily labs  Treat fever with PRN APAP  Last culture dates: 3/6 RPP negative           HAI Prevention Plan  N/A - No indwelling foley catheter  N/A - No central line/PICC in place       HEME/ONC:   Risk for DVT  Suspicion of pancreatic malignancy   Risk for DVT  SCDs ordered  DVT chemoprophylaxis: enoxaparin  (LOVENOX ) syringe 40 mg     Suspicion of pancreatic malignancy  Heme/Onc following  Awaiting tumor markers from biopsy 2/26     ENDO/RHEUM:  No active issues Hgb A1c 6.0  SSI ordered ACHS  No insulin  required in last 48 hrs, will discontinue for now       Current Infusions (n/a if blank):            Advance Care Planning: Open ACP Navigator  Code Status: Full Code  Next of Kin:  Primary Emergency Contact: Wentling,Sanros Dorn           ADVANCE CARE PLAN                     Objective      Examination:   Temp:  [97.4 F (36.3 C)-98.6 F (37 C)] 97.4 F (36.3 C)  Heart Rate:  [76-118] 88  Resp Rate:  [22-41] 26  BP: (103-127)/(50-89) 114/89  Neuro:    Non-focal neuro exam alert & oriented x 3     Lungs:   Clear/diminshed to auscultation, no wheezes, rales or  rhonchi, symmetric air entry  Cardiac:   normal rate, regular rhythm, normal S1, S2, no murmurs, rubs, clicks or gallops     Abdomen:    Soft, rounded, tender from multiple episodes of emesis     Extremities:   peripheral pulses normal    Skin:     Warm, dry and intact   Fick/Cardiac Hemodynamics   VO2 Determination  Height: 149.9 cm (4' 11)                   SVR Determination  MAP (mmHg): 97       Most Recent Labs  8.08 \ 13.3 / 406*    / 40.8 \    03/10 0422    140 98* 6* / 97   3.3* 31* 0.6 \    03/10 0422              Additional Diagnoses:     Patient has a BMI of 35.4 kg/m2    Diagnosis: Class 2 Obesity: BMI of 35 to 39.9      Recent Labs     04/06/23  0422 04/05/23  0337 04/04/23  0352   Potassium 3.3* 3.6 3.9     Diagnosis: Hypokalemia

## 2023-04-06 NOTE — Progress Notes (Signed)
 Gastroenterology  PROGRESS NOTE  Gastroenterology Consult Service - [facility]  Epic Chat (Group): [facility]  [address]  [facility] [place], [place] [address]  Appointments: [phone]      Date Time: 04/06/23 8:35 AM  Patient Name: Valerie May    Assessment and Recommendations:   53 y.o. female with history notable for prior hernia repair, cholecystectomy, polycythemia, chronic pain, recent/recurrent pneumonia, DM2, and GERD who is admitted for intractable nausea/vomiting and abdominal pain, as well as more generalized R sided pain incidentally found to have a pancreatic head lesion.      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 Fine needle biopsy performed --> inadequate tissue from FNB, GI asked to repeat biopsy.   Repeat EUS attempted on 3/7, however, large amount of food residue was found in stomach and duodenum thus unable to perform EUS with FNB.   Acute hypoxic respiratory failure - difficult to extubate post procedure 3/7, remained intubated and transferred to ICU, now extubated 3/8, on 4L NC.   Candida esophagitis - found on EGD 2/26, on fluconazole .   N/V, epigastric abd pain - likely all 2/2 above  Acute elevation in LFTs, mild and improving - MRCP with mild prominence of tapering toward the ampulla but likely physiologic in setting of cholecystectomy. Bilirubin WNL.   Chronic constipation - possibly dysmotility vs pelvic floor dysfunction    Recommendations:  - Plan for repeat EUS with FNB tomorrow, Tuesday 3/11 in OR.   - CLD today. NPO midnight.   - AM labs ordered for pre procedure.     Preparation for endoscopy (criteria for safe sedation):  - K > 3.2 and < 5.6  - Na > 126 and < 145  - HgB > 7, and > 8 for cardiac history  - Oxygen saturation of 93% on 6 liters of oxygen or less  - PT INR < 1.5- 1.7  and platelets > 50    I performed and documented the substantive portion of the MDM component of the visit. Patient was also seen by Kylie Gravatt, PA  during this encounter. I agree with all clinical information, including the physical exam, patient history, and planning as documented by the APP within the note. We discussed and formulated the assessment and plan together and have updated as needed.    Carole CHRISTELLA Rice, MD            Subjective:   Patient denies complaints, understands and agrees to procedure tomorrow, all questions answered.     Medications:     Current Facility-Administered Medications   Medication Dose Route Frequency    aluminum & magnesium  / diphenhydrAMINE  / lidocaine   15 mL Oral QID    aspirin  EC  81 mg Oral Daily    budesonide -formoterol   2 puff Inhalation BID    cetirizine   10 mg Oral Daily    enoxaparin   40 mg Subcutaneous Daily    fluconazole   400 mg Oral Q24H SCH    fluocinonide    Topical BID    insulin  lispro  1-4 Units Subcutaneous QHS    insulin  lispro  1-8 Units Subcutaneous TID AC    lidocaine   2 patch Transdermal Q24H    metoclopramide   5 mg Oral QID    montelukast   10 mg Oral QHS    pantoprazole   40 mg  Oral Q12H SCH    polycarbophil  625 mg Oral BID    polyethylene glycol  17 g Oral Daily    senna-docusate  2 tablet Oral QHS    venlafaxine   25 mg Oral TID MEALS       HOME MEDICATIONS:   Current Discharge Medication List        CONTINUE these medications which have NOT CHANGED    Details   acetaminophen  (TYLENOL ) 650 MG CR tablet TAKE 1 TABLET BY MOUTH EVERY 8 HOURS AS NEEDED FOR PAIN  Qty: 90 tablet, Refills: 1    Comments: DX Code Needed  .  Associated Diagnoses: Polyarthritis      albuterol  sulfate HFA (PROVENTIL ) 108 (90 Base) MCG/ACT inhaler Inhale 1-2 puffs into the lungs every 4 (four) hours as needed      benzonatate  (TESSALON ) 200 MG capsule Take 1 capsule (200 mg) by mouth 3 (three) times daily as needed for Cough      Budeson-Glycopyrrol-Formoterol  160-9-4.8 MCG/ACT  Aerosol Inhale 2 puffs into the lungs 2 (two) times daily  Qty: 10.7 g, Refills: 5      budesonide -formoterol  (SYMBICORT ) 160-4.5 MCG/ACT inhaler Inhale 2 puffs into the lungs 2 (two) times daily as needed      cetirizine  (ZyrTEC ) 10 MG tablet Take 10 mg by mouth daily      [facility] Aspirin  Low Dose 81 MG EC tablet Take 1 tablet (81 mg) by mouth daily      diphenhydrAMINE -APAP, sleep, (Excedrin PM) 38-500 MG Tab Take by mouth daily      famotidine  (PEPCID ) 10 MG tablet Take 10 mg by mouth 2 (two) times daily      metFORMIN (GLUCOPHAGE) 500 MG tablet Take 1 tablet (500 mg) by mouth 2 (two) times daily with meals      methocarbamol (ROBAXIN) 750 MG tablet as needed      montelukast  (SINGULAIR ) 10 MG tablet Take 1 tablet (10 mg) by mouth nightly      naproxen  (EC NAPROSYN ) 500 MG EC tablet Take 1 tablet (500 mg) by mouth 2 (two) times daily with meals  Qty: 60 tablet, Refills: 1    Associated Diagnoses: Neck pain; Right ear pain      ondansetron  (ZOFRAN ) 8 MG tablet Take by mouth every 8 (eight) hours as needed for Nausea      pantoprazole  (PROTONIX ) 40 MG tablet TAKE 1 TABLET BY MOUTH EVERY DAY  Qty: 30 tablet, Refills: 1    Associated Diagnoses: Gastroesophageal reflux disease without esophagitis      vitamin May , ergocalciferol , (DRISDOL ) 50000 UNIT Cap Take 1 capsule (50,000 Units total) by mouth once a week  Qty: 12 capsule, Refills: 1    Associated Diagnoses: Routine general medical examination at a health care facility               Physical Exam:   Visit Vitals  BP 120/82   Pulse 83   Temp 97.6 F (36.4 C) (Temporal)   Resp (!) 23   Ht 1.499 m (4' 11)   Wt 79.5 kg (175 lb 4.3 oz)   LMP 03/18/2012   SpO2 94%   BMI 35.40 kg/m        Intake and Output Summary (Last 24 hours) at Date Time    Intake/Output Summary (Last 24 hours) at 04/06/2023 0835  Last data filed at 04/05/2023 2000  Gross per 24 hour   Intake 887 ml   Output 1380 ml   Net -493 ml  Physical Exam:  General Examination:   GENERAL APPEARANCE: Alert, in  no acute distress, well developed, well nourished.  HENT: No scleral icterus.  HEART: Clinically well perfused.  LUNGS: No respiratory distress.  ABDOMEN: Soft, non distended, non tender.     Labs:       Recent Labs   Lab 04/06/23  0422 04/05/23  0337 04/04/23  0352 04/03/23  0302 04/01/23  0332   WBC 8.08 8.99 9.26  More results in Results Review 8.31   Hemoglobin 13.3 13.1 12.7  More results in Results Review 14.2   Hematocrit 40.8 40.2 39.4  More results in Results Review 44.7*   Platelet Count 406* 366* 356*  More results in Results Review 384*   MCV 89.7 92.2 93.1  More results in Results Review 93.7   Neutrophils %  --   --   --   --  47.0   More results in Results Review = values in this interval not displayed.       Recent Labs   Lab 04/06/23  0422 04/05/23  0337 04/04/23  0352 04/02/23  0415 04/01/23  0332 03/31/23  0359   Sodium 140 137 137  More results in Results Review 135 136   Potassium 3.3* 3.6 3.9  More results in Results Review 4.3 4.2   Chloride 98* 96* 97*  More results in Results Review 98* 97*   CO2 31* 31* 31*  More results in Results Review 30* 31*   BUN 6* 6* 9  More results in Results Review 10 11   Creatinine 0.6 0.5 0.6  More results in Results Review 0.7 0.7   Calcium  9.4 9.1 8.9  More results in Results Review 9.6 9.5   Albumin  --   --  3.3*  --  3.6 3.8   Protein, Total  --   --  6.6  --  7.2 7.5   Bilirubin, Total  --   --  0.4  --  0.3 0.3   Bilirubin Direct  --   --  0.2  --   --   --    Alkaline Phosphatase  --   --  187*  --  199* 204*   ALT  --   --  132*  --  104* 85*   AST (SGOT)  --   --  70*  --  64* 54*   Glucose 97 97 104*  More results in Results Review 97 120*   More results in Results Review = values in this interval not displayed.              Lab Results   Component Value Date    LIP 26 03/21/2023        Rads:     Radiology Results (24 Hour)       ** No results found for the last 24 hours. **            Signed by: Kylie J Gravatt, PA

## 2023-04-06 NOTE — Plan of Care (Signed)
 Problem: Pain interferes with ability to perform ADL  Goal: Pain at adequate level as identified by patient  Outcome: Not Progressing     Problem: Side Effects from Pain Analgesia  Goal: Patient will experience minimal side effects of analgesic therapy  Outcome: Progressing     Problem: Day of Admission - Heart Failure  Goal: Heart Failure Admission  Outcome: Progressing     Problem: Pain  Goal: Pain at adequate level as identified by patient  Outcome: Not Progressing

## 2023-04-07 ENCOUNTER — Ambulatory Visit

## 2023-04-07 DIAGNOSIS — J9612 Chronic respiratory failure with hypercapnia: Secondary | ICD-10-CM

## 2023-04-07 DIAGNOSIS — G43809 Other migraine, not intractable, without status migrainosus: Secondary | ICD-10-CM

## 2023-04-07 DIAGNOSIS — D7219 Other eosinophilia: Secondary | ICD-10-CM

## 2023-04-07 DIAGNOSIS — G4733 Obstructive sleep apnea (adult) (pediatric): Secondary | ICD-10-CM

## 2023-04-07 DIAGNOSIS — J4520 Mild intermittent asthma, uncomplicated: Secondary | ICD-10-CM

## 2023-04-07 DIAGNOSIS — K219 Gastro-esophageal reflux disease without esophagitis: Secondary | ICD-10-CM

## 2023-04-07 LAB — HEPATIC FUNCTION PANEL (LFT)
ALT: 62 U/L — ABNORMAL HIGH (ref <=55)
AST (SGOT): 35 U/L (ref <=41)
Albumin/Globulin Ratio: 1.1 (ref 0.9–2.2)
Albumin: 3.9 g/dL (ref 3.5–5.0)
Alkaline Phosphatase: 140 U/L — ABNORMAL HIGH (ref 37–117)
Bilirubin Direct: 0.2 mg/dL (ref 0.0–0.5)
Bilirubin Indirect: 0.3 mg/dL (ref 0.2–1.0)
Bilirubin, Total: 0.5 mg/dL (ref 0.2–1.2)
Globulin: 3.4 g/dL (ref 2.0–3.6)
Protein, Total: 7.3 g/dL (ref 6.0–8.3)

## 2023-04-07 LAB — CBC
Absolute nRBC: 0 10*3/uL (ref <=0.00)
Hematocrit: 41.9 % (ref 34.7–43.7)
Hemoglobin: 14 g/dL (ref 11.4–14.8)
MCH: 29.5 pg (ref 25.1–33.5)
MCHC: 33.4 g/dL (ref 31.5–35.8)
MCV: 88.4 fL (ref 78.0–96.0)
MPV: 9.8 fL (ref 8.9–12.5)
Platelet Count: 416 10*3/uL — ABNORMAL HIGH (ref 142–346)
RBC: 4.74 10*6/uL (ref 3.90–5.10)
RDW: 12 % (ref 11–15)
WBC: 8.91 10*3/uL (ref 3.10–9.50)
nRBC %: 0 /100{WBCs} (ref <=0.0)

## 2023-04-07 LAB — BASIC METABOLIC PANEL
Anion Gap: 9 (ref 5.0–15.0)
BUN: 9 mg/dL (ref 7–21)
CO2: 29 meq/L (ref 17–29)
Calcium: 9.7 mg/dL (ref 8.5–10.5)
Chloride: 102 meq/L (ref 99–111)
Creatinine: 0.7 mg/dL (ref 0.4–1.0)
GFR: >60 mL/min/{1.73_m2} (ref >=60.0)
Glucose: 96 mg/dL (ref 70–100)
Potassium: 3.3 meq/L — ABNORMAL LOW (ref 3.5–5.3)
Sodium: 140 meq/L (ref 135–145)

## 2023-04-07 LAB — MAGNESIUM: Magnesium: 1.9 mg/dL (ref 1.6–2.6)

## 2023-04-07 LAB — PT/INR
INR: 1.1 (ref 0.9–1.1)
PT: 12.9 s (ref 10.1–12.9)

## 2023-04-07 MED ORDER — BUDESONIDE-FORMOTEROL FUMARATE 160-4.5 MCG/ACT IN AERO
2.0000 | INHALATION_SPRAY | Freq: Two times a day (BID) | RESPIRATORY_TRACT | 0 refills | Status: DC
Start: 2023-04-07 — End: 2023-04-07

## 2023-04-07 MED ORDER — UMECLIDINIUM BROMIDE 62.5 MCG/ACT IN AEPB: 1.0000 | INHALATION_SPRAY | Freq: Every day | RESPIRATORY_TRACT | 0 refills | Status: DC

## 2023-04-07 MED ORDER — VENLAFAXINE HCL 25 MG PO TABS
25.0000 mg | ORAL_TABLET | Freq: Three times a day (TID) | ORAL | 0 refills | Status: DC
Start: 2023-04-08 — End: 2023-04-28

## 2023-04-07 MED ORDER — BUDESONIDE-FORMOTEROL FUMARATE 160-4.5 MCG/ACT IN AERO: 2.0000 | INHALATION_SPRAY | Freq: Two times a day (BID) | RESPIRATORY_TRACT | 0 refills | Status: DC

## 2023-04-07 MED ORDER — UMECLIDINIUM BROMIDE 62.5 MCG/ACT IN AEPB
1.0000 | INHALATION_SPRAY | Freq: Every day | RESPIRATORY_TRACT | 0 refills | Status: DC
Start: 2023-04-07 — End: 2023-04-07

## 2023-04-07 MED ORDER — VENLAFAXINE HCL 25 MG PO TABS
25.0000 mg | ORAL_TABLET | Freq: Three times a day (TID) | ORAL | 0 refills | Status: DC
Start: 2023-04-08 — End: 2023-04-07

## 2023-04-07 NOTE — Plan of Care (Addendum)
 Reached out by [facility].  EGD/EUS with [facility] postponed till tomorrow due to pathologist not available.  Will advance diet to clear liquid for now.  N.p.o. past midnight for procedure in a.m.  Patient is frustrated regarding postponing of biopsy and does not want to be on CLD anymore. Reached out to GI regarding possibility of having regular diet today with Npo past midnight. Awaiting response. Patient wants to leave AMA despite explaining the possibility of worsening abdominal pain, nausea, vomiting, progression of possible cancer and even death. Refills for symbicort  , incruse and effexor  sent to [facility] at [address] on patient's request.

## 2023-04-07 NOTE — Progress Notes (Signed)
 Cardiac Intensive Care Unit (CICU)  Progress Note      Patient Name: Valerie May  MRN: [id number]  Room: FI300/FI300.01    ICU Day: Patient does not have an ICU stay during this admission.    Attending Attestation:     Attending Statement      As the physician signing this note, I have reviewed the history and exam and pertinent imaging results. My addendum revises, addends or confirms the recommendations or plans in this note. I discussed with the plan of care with the NPP/Resident and I agree with the findings noted unless revised or updated in my notations. I formulated all of the medical decision making noted in the assessment & plan.    This patient is critically ill with life-threatening condition(s) and a high probability of sudden clinically significant deterioration due to the condition(s) noted in the assessment and plan, which requires the highest level of physician's / advance practice provider's preparedness to intervene urgently.  Full attention to the direct care of this patient was provided for the period of time noted, which includes review of laboratory data, radiology results, discussion with consultant(s) monitoring for potential decompensation and performing a history exam and medical decision making.  All critical care time personally performed today was exclusive of teaching, billable procedures, and not overlapping with any other physicians or advance practice providers.    NPO after midnight for EGD/ EUS and biopsy today with GI in OR  Diarrhea and nausea/ vomiting, exacerbated by clear diet reportedly  WBC stable, consider cdiff if continues  Continue symbicort , incruse for eosinophilic asthma  Will need outpatient sleep study, CPAP WH[facility]  OK for transfer to floor, Dr. Kristi, accepting physician.    I have not personally examined the patient. I agree with the findings noted in the non-physician provider note and have reviewed the patient's history, exam, laboratory findings, imaging  results, monitoring data for potential decompensation, and additional findings found in detail within the team notes. Numerous life or organ-supporting interventions have been undertaken in addition to discussion with the consultant(s) and clinical staff, including advance practice providers, critical care fellows, house staff, and nurses, as well as counseling the patient/family members (as described in the note). I formulated all the medical decision-making and coordinated care as stated in the Assessment & Plan in discussion with the non-physician provider. Independent non-physician provider (NPP) time is indicated by the minutes as noted in the NPP note.        Physician care time (independent): 10 minutes   Total rounding/joint time:  10 minutes   Total critical care time: 58 minutes   Attending Physician: Duwaine FORBES Derby, MD     Substantive Service provided by   Physician or NPP   (provided > 50% of total care time): Non-physician provider (PA/NP)                             Interval Events   24 hour events: no events overnight. NPO at MN. Intermittent nausea w/o vomiting.     Subjective   Hospital Course:  53 y.o. female w/ PMHx asthma, eosinophilia, bronchiectasis, recurrent PNA, GERD, DM2, polycythemia, obesity. Admitted to hospital on 2/22 w/ N&V, abdominal pain and altered mental status. Noted to be hypercarbic on VBG, [facility] consulted, recommended to start patient on nocturnal CPAP for OHS and avoid narcotics. 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 GI consulted. EGD / EUS w/  fine needle biopsy performed on 2/26. During this procedure te patient was extubated to CPAP w/ NPA. 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 Oncology recommended to repeat biopsy. Today patient was taken for repeat EGD for EUS w/ biopsy but could not be completed due to large amount of food in the stomach. Anesthesia was unable to extubate her due to low Vt and high ETCO2. Called  [facility] for admission.     3/7: Extubated in CICU  3/8: downgraded to floor, awaiting bed. Lasix  20mg  X 1   3/9: tolerating clears.    3/10: intermittent HA and nausea, mild anxiety. Eager to go home.        Assessment & Plan   Assessment and Plan   Summary: DARL BRISBIN a 53 y.o. female who presents following EGD unable to extubate due to acute hypercarbic respiratory failure however was extubated on arrival to ICU and is now pending transfer with plan repeat EGD 3/11    System (Problem List) Plan   NEUROLOGICAL:  Acute metabolic encephalopathy - resolved  Migraines   Anxiety, situational       Current PT Order: No  Current OT Order: No PRN Tylenol , Fioricet , Toradol  for pain  Delirium precautions including maintenance of day/night wake cycles, frequent reorientation as needed  Physical therapy/Occupational Therapy/Out of bed to chair, as indicated/tolerated  Avoiding narcotics given concern for gastroparesis   Previously on sumatriptan  as outpatient      CARDIOVASCULAR:   NAI    Ejection Fraction          03/21/2023    13:52   Ejection Fraction   Ejection Fraction 63       Echo from Feb 2025 - EF 63%, normal function  Currently meeting BP goals   Blood Pressure Goal: MAP > 65, SBP  < 160   PULMONARY:   Acute Hypoxic respiratory failure, improved   Acute on chronic hypercarbic respiratory failure, improved   OHS  Eosinophilic asthma?    Last vent settings if on vent:  FiO2 (%):  [24 %] 24 %  PEEP/EPAP:  [8 cm H20] 8 cm H20  Ventilator Time (if on vent):   /3d 17h    Room air while awake  CPAP QHS for sleep apnea  Supplemental oxygen as needed to keep sat > 90%  C/w Incruse for eosinophilic asthma   No indication for steroids   Diuresing PRN   Pulm consulted    RENAL:   NAI      Intake/Output Summary (Last 24 hours) at 04/07/2023 9287  Last data filed at 04/07/2023 0400  Gross per 24 hour   Intake 848 ml   Output 1200 ml   Net -352 ml      Spot diuresis PRN   Strict I/O  Daily weights   Replace lytes as needed    GASTROINTESTINAL:  GERD  Pancreatic Mass   Candida Esophagitis likely secondary to steroid inhaler   N/V  Diarrhea, improved   Concern for Gastroparesis       Last Bowel Movement:   Last BM Date: 04/06/23   Prior EGD aborted d/t food presence in stomach   Plan for repeat EGD with EUS biopsy today  Nutrition: NPO today for EGD  Stop bowel regimen, fibercon and flagyl given on going diarrhea   PRN zofran  and maylox   Completed fluconazole  today   Check LFT today   Hepatic steatosis on MRI      INFECTIOUS DISEASE:  No acute issues    Temp (24hrs), Avg:97.7 F (36.5 C), Min:97.4 F (36.3 C), Max:98 F (36.7 C)     1 mL  Lab Results   Component Value Date    WBC 8.91 04/07/2023      CAUTI Prevention (n/a if blank):  Foley Day:         Blood Steam Infection Prevention (n/a if blank):      Last culture dates: n/a   Afebrile w/o leukocytosis   No indication for abx           HAI Prevention Plan  N/A - No indwelling foley catheter  N/A - No central line/PICC in place       HEME/ONC:   Pancreatic mass        Oncology consulted - previous biopsy suspicious for NEN, acinar, cell carcinoma, or solid pseudopapillary neoplasm, requesting repeat biopsy and pathology to drive management    SCDs  DVT chemoprophylaxis: enoxaparin  (LOVENOX ) syringe 40 mg (held)     ENDO/RHEUM:   DM2    Goal glucose range 100 - 180       Current Infusions (n/a if blank):            Advance Care Planning: Open ACP Navigator  Code Status: Full Code  Next of Kin:  Primary Emergency Contact: Allende,Sanros Dorn           ADVANCE CARE PLAN                     Objective      Examination:   Temp:  [97.4 F (36.3 C)-98 F (36.7 C)] 97.5 F (36.4 C)  Heart Rate:  [88-109] 94  Resp Rate:  [26-34] 26  BP: (111-133)/(64-89) 127/67  FiO2 (%):  [24 %] 24 %  Neuro:    Awake and alert, oriented   No focal deficits   Mildly anxious     Lungs:   Clear to auscultation bilaterally     Cardiac:   Regular rate and rhythm     Abdomen:    Soft, global tenderness on  palpation      Extremities:   peripheral pulses normal no pedal edema    Skin:     Warm, dry and intact   Fick/Cardiac Hemodynamics   VO2 Determination  Height: 149.9 cm (4' 11)                   SVR Determination  MAP (mmHg): 90       Most Recent Labs  8.91 \ 14.0 / 416*    / 41.9 \    03/11 0439    140 102 9 / 96   3.3* 29 0.7 \    03/11 0439              Additional Diagnoses:     Patient has a BMI of 34.13 kg/m2    Diagnosis: Class 2 Obesity: BMI of 35 to 39.9                             I have personally assessed the patient and based my assessment and medical decision-making on a review of the patient's history and 24-hour interval events along with medical records, physical examination, vital signs, analysis of recent laboratory results, evaluation of radiology images, and monitoring data. The findings and plan of care was discussed with the care team.      Total  care time: 38 minutes    Morene JONETTA Eans, NP-C  04/07/2023 7:12 AM

## 2023-04-07 NOTE — Plan of Care (Signed)
 Problem: Pain interferes with ability to perform ADL  Goal: Pain at adequate level as identified by patient  Outcome: Progressing     Problem: Side Effects from Pain Analgesia  Goal: Patient will experience minimal side effects of analgesic therapy  Outcome: Progressing     Problem: Day of Admission - Heart Failure  Goal: Heart Failure Admission  Outcome: Progressing     Problem: Everyday - Heart Failure  Goal: Stable Vital Signs and Fluid Balance  Outcome: Progressing  Goal: Mobility/Activity is Maintained at Optimal Level for Patient  Outcome: Progressing  Goal: Nutritional Intake is Adequate  Outcome: Progressing  Goal: Teaching-Using CHF Warning Zones and Educational Videos  Outcome: Progressing     Problem: Compromised Activity/Mobility  Goal: Activity/Mobility Interventions  Outcome: Progressing     Problem: Day of Discharge - Heart Failure  Goal: Discharge Education  Outcome: Progressing     Problem: Compromised Sensory Perception  Goal: Sensory Perception Interventions  Outcome: Progressing     Problem: Compromised Moisture  Goal: Moisture level Interventions  Outcome: Progressing     Problem: Compromised Nutrition  Goal: Nutrition Interventions  Outcome: Progressing     Problem: Compromised Friction/Shear  Goal: Friction and Shear Interventions  Outcome: Progressing     Problem: Moderate/High Fall Risk Score >5  Goal: Patient will remain free of falls  Outcome: Progressing     Problem: Safety  Goal: Patient will be free from injury during hospitalization  Outcome: Progressing  Goal: Patient will be free from infection during hospitalization  Outcome: Progressing     Problem: Pain  Goal: Pain at adequate level as identified by patient  Outcome: Progressing     Problem: Discharge Barriers  Goal: Patient will be discharged home or other facility with appropriate resources  Outcome: Progressing     Problem: Psychosocial and Spiritual Needs  Goal: Demonstrates ability to cope with  hospitalization/illness  Outcome: Progressing     Problem: Psychosocial and Spiritual Needs  Goal: Demonstrates ability to cope with hospitalization/illness  Outcome: Progressing     Problem: Non-Violent Restraints Interdisciplinary Plan  Goal: Will be injury free during the use of non-violent restraints  Outcome: Progressing

## 2023-04-07 NOTE — Progress Notes (Addendum)
 MEDICINE PROGRESS NOTE    Date Time: 04/07/23 11:56 AM  Patient Name: [facility] D  Attending Physician: Everet Duwaine BRAVO, MD    Assessment / Plan:   53 y.o. female with a history of eosinophilic asthma, mild bronchiectasis, recurrent pneumonia/bronchitis, migraine , prolonged admit in [place]  for COVID-19 in 2021, sinus disease, GERD, prediabetic who presented to [facility] 03/21/2023 with intractable nausea and vomiting, abdominal pain for 2 months.  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  Repeat biopsy attempted on 3/7 had to be aborted due to excessive food residue.  Procedure was complicated by failure to extubate due to low tidal volumes and hypercarbia resulting in upgrade to ICU.  Patient was extubated on 3/7 and is planned for repeat biopsy today.  Hemodynamically stable for downgrade to medicine floor.    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  -CA 19-9, chromogranin normal 3/7  -Patient is planned for repeat EUS/EGD with FNB  today  -Appreciate GI recommendations  -N.p.o. for procedure today    # Acute hypoxic and hypercarbic respiratory failure in the setting of OSA, IV narcotics, possible aspiration pneumonia and asthma-resolved  # OSA  # Eosinophilic asthma  # History of COVID 19 2021  -Patient is satting well on room air  -Continue nightly CPAP  -Continue Symbicort , Incruse Ellipta  ,cetirizine  and Singulair   -Appreciate pulmonology recs      # Candida esophagitis on EGD 2/20  # Nausea and vomiting likely secondary to above  -Continue fluconazole  until 3/11  -Zofran  as needed for nausea.  Continue to monitor QTc.  Avoid other QTc prolonging medications .QTc 452 ms on 3/1    # Chronic constipation  # Acute diarrhea  -Laxatives held 3/10 due to  above  -Monitor for ongoing diarrhea if persists might need to send C. difficile    #chronic Migraine  -resume Fioricet  as needed    #Depression/anxiety  -resume PTA Effexor  25mg  TID    # Chronic back pain  -Resume multimodal pain control with Tylenol , lidocaine  patch, tramadol     #PTA aspirin  stopped in ICU due to no strong indication    Lovenox  subcu  Full code  N.p.o. for now    Case discussed with: Patient, RN    Additional Diagnoses:                 Safety Checklist:     DVT prophylaxis:  CHEST guideline (See page [id number]) Chemical   Foley:  Finger Rn Foley protocol Not present   IVs:  Peripheral IV   PT/OT: Not needed   Daily CBC & or Chem ordered:  SHM/ABIM guidelines (see #5) Yes, due to clinical and lab instability   Reference for approximate charges of common labs: CBC auto diff - $76  BMP - $99  Mg - $79    Lines:     Patient Lines/Drains/Airways Status       Active PICC Line / CVC Line / PIV Line / Drain / Airway / Intraosseous Line / Epidural Line / ART Line / Line / Wound / Pressure Ulcer / NG/OG Tube       Name Placement date Placement time Site Days    Peripheral IV 04/02/23 20 G Standard Right Forearm 04/02/23  2127  Forearm  4    Peripheral IV 04/04/23 20 G Anterior;Left Forearm 04/04/23  2200  Forearm  2                     Disposition: (Please see PAF column for Expected D/C Date)   Today's date: 04/07/2023  Admit Date: 03/21/2023  4:55 AM  LOS: 16  Clinical Milestones: TBD  Anticipated discharge needs: TBD      Subjective     CC: Generalized abdominal pain    Interval History/24 hour events: Patient reports still feeling nauseous due to not eating for a long time and is vomiting clear to bilious fluid.  Reports last bowel movement on Sunday.  She also reports diffuse abdominal pain similar as compared to before and feels her abdomen is more bloated than before.  She reports feeling depressed since the death of her mother and separation from her ex-husband.  She reports extensive family history of  cancers.Aunt died of bone cancer, another aunt and cousin died of Gatsric cancer?paternal uncle died of renal cancer , another uncle has colon cancer    HPI/Subjective: 53 y.o. female with a history of asthma, eosinophilia, mild bronchiectasis, recurrent pneumonia/bronchitis, migraine headaches, prolonged admit in [place]  for COVID-19 in 2021, sinus disease, GERD, DM2 who presented to [facility] 03/21/2023 with intractable nausea and vomiting, abdominal pain for 2 months.  Patient was found to be hypercarbic on subsequent VBG and [facility] was consulted who recommended nocturnal CPAP and to avoid narcotics.  Incidentally found to have a 1.2 cm pancreatic mass on imaging and underwent EGD/EUS 2/26.  Pancreatic biopsy was indeterminate but suspicious for an EN, acinar cell carcinoma or solid pseudopapillary neoplasm.  Oncology recommended repeat biopsy.  Esophageal brushing confirmed esophageal candidiasis and she completed a course of fluconazole .  She remained admitted due to multifocal pain complaints requiring palliative care consult to assist with a pain regimen. Patient underwent repeat EGD/EUS on 3/7 which could not be completed due to large amount of food residue.  Anesthesia was not able to extubate the patient due to low tidal volumes and high end-tidal CO2 was suspected secondary to OSA and possible aspiration event.  Patient was upgraded to ICU on 3/7.  Patient was extubated on 3/7 on arrival to CICU.  Patient is pending repeat EUS/EGD with FNB today and is being transferred to medical floor.    Review of Systems:     As per HPI    Physical Exam:     VITAL SIGNS PHYSICAL EXAM   Temp:  [97.5 F (36.4 C)-98 F (36.7 C)] 97.8 F (36.6 C)  Heart Rate:  [91-109] 103  Resp Rate:  [26-34] 28  BP: (111-133)/(64-83) 128/79  FiO2 (%):  [24 %] 24 %  Blood Glucose:    Telemetry: No      Intake/Output Summary (Last 24 hours) at 04/07/2023 1156  Last data filed at 04/07/2023 0400  Gross per 24 hour   Intake 598 ml   Output 700 ml    Net -102 ml    Physical Exam  General: awake, alert X 3  Cardiovascular: regular rate and rhythm, no murmurs, rubs or gallops  Lungs: clear to auscultation bilaterally, without wheezing, rhonchi, or rales  Abdomen: soft, non-tender, non-distended; no palpable masses,  normoactive bowel sounds  Extremities: no edema         Meds:     Medications were reviewed:  Current Facility-Administered Medications   Medication Dose Route Frequency    budesonide -formoterol   2 puff Inhalation BID    cetirizine   10 mg Oral Daily    [Held by provider] enoxaparin   40 mg Subcutaneous Daily    fluconazole   400 mg Oral Q24H SCH    fluocinonide    Topical BID    lidocaine   2 patch Transdermal Q24H    montelukast   10 mg Oral QHS    pantoprazole   40 mg Oral Q12H SCH    umeclidinium bromide   1 puff Inhalation QAM    venlafaxine   25 mg Oral TID MEALS     Infusion Meds[1]  PRN Medications[2]      Labs:     Labs (last 72 hours):    Recent Labs   Lab 04/07/23  0439 04/06/23  0422   WBC 8.91 8.08   Hemoglobin 14.0 13.3   Hematocrit 41.9 40.8   Platelet Count 416* 406*       Recent Labs   Lab 04/07/23  0616   PT 12.9   INR 1.1    Recent Labs   Lab 04/07/23  0439 04/06/23  1633 04/05/23  0337 04/04/23  0352   Sodium 140 141  More results in Results Review 137   Potassium 3.3* 3.8  More results in Results Review 3.9   Chloride 102 105  More results in Results Review 97*   CO2 29 26  More results in Results Review 31*   BUN 9 7  More results in Results Review 9   Creatinine 0.7 0.5  More results in Results Review 0.6   Calcium  9.7 9.4  More results in Results Review 8.9   Albumin 3.9  --   --  3.3*   Protein, Total 7.3  --   --  6.6   Bilirubin, Total 0.5  --   --  0.4   Alkaline Phosphatase 140*  --   --  187*   ALT 62*  --   --  132*   AST (SGOT) 35  --   --  70*   Glucose 96 112*  More results in Results Review 104*   More results in Results Review = values in this interval not displayed.                   Microbiology, reviewed and are  significant for:  Microbiology Results (last 15 days)       Procedure Component Value Units Date/Time    Nares, MRSA (methicillin-resistant Staphylococcus aureus) Screening, PCR [[id number]]  (Normal) Collected: 04/03/23 1314    Order Status: Completed Specimen: Swab from Nares Updated: 04/03/23 1444     MRSA (methicillin resistant Staphylococcus aureus) DNA Not Detected     Comment: Testing performed with the Xpert MRSA NxG assay. This test is intended for the detection of methicillin-resistant Staphylococcus aureus (MRSA) DNA. This test is not intended to diagnose, guide, or monitor treatment for MRSA infections, or provide results of susceptibility to methicillin. A positive test result does not necessarily indicate the presence of viable organisms. A negative result does not preclude MRSA nasal colonization.       Respiratory Pathogen Panel With COVID-19, PCR [[id number]]  (Normal) Collected: 04/02/23 1017    Order Status: Completed Specimen: Nasopharyngeal Swab from Nasopharynx Updated: 04/02/23 1855     Adenovirus DNA Not Detected     Coronavirus HKU1 RNA Not Detected     Coronavirus NL63 RNA Not Detected     Coronavirus 229E RNA Not Detected     Coronavirus OC43 RNA Not Detected  Human Metapneumovirus RNA Not Detected     Human Rhinovirus/Enterovirus RNA Not Detected     Influenza A RNA Not Detected     Influenza B RNA Not Detected     Parainfluenza Virus 1 RNA Not Detected     Parainfluenza Virus 2 RNA Not Detected     Parainfluenza Virus 3 RNA Not Detected     Parainfluenza Virus 4 RNA Not Detected     Respiratory Syncytial Virus RNA Not Detected     Bordetella pertussis DNA Not Detected     Bordetella parapertussis DNA Not Detected     Chlamydophila pneumoniae DNA Not Detected     Mycoplasma pneumoniae DNA Not Detected     SARS-CoV-2 (COVID-19) RNA Not Detected    Narrative:      Testing performed using the [employer] FilmArray Respiratory Panel (RP 2.1).  This test is for the qualitative detection of  respiratory pathogen nucleic acid. This assay cannot differentiate between Rhinovirus/Enterovirus. If necessary for patient care, a positive result for Rhinovirus/Enterovirus may be followed-up using an alternate method. This assay should not be used if B. pertussis infection is specifically suspected as it is less sensitive than other alternatives. If suspected, ensure that B. pertussis specific testing is ordered. Recent administration of a nasal influenza vaccine may cause false positive results for Influenza A and/or Influenza B.    Viral and bacterial nucleic acids may persist even though no viable organism is present. Detection of nucleic acid does not imply that the corresponding organisms are infectious or are the causative agents of clinical symptoms. Positive results of this test do not rule out coinfection with other organisms. A negative result does not exclude the possibility of viral or bacterial infection.  Assay performance characteristics may vary with circulating strains and this assay may not be able to distinguish between existing viral strains and new variants as they emerge. Results of this test should not be used as the sole basis for diagnosis, treatment, or other patient management decisions.    This assay is FDA authorized for nasopharyngeal swab samples.  Performance characteristics for Bronchoalveolar lavage samples have been determined by the [facility] laboratory. Other sample types are unacceptable.     Invalid results may be due to inhibiting substances in the specimen and recollection should occur.       Culture, Sputum and Lower Respiratory [[id number]] Collected: 04/02/23 0847    Order Status: Completed Specimen: Sputum, Expectorated Updated: 04/04/23 0705     Culture Respiratory Light growth of Mixed upper respiratory flora     Gram Stain Many WBCs      Few Squamous epithelial cells      Many Mixed respiratory flora    Narrative:      No Pseudomonas aeruginosa, No Staphylococcus  aureus, No Methicillin Resistant Staphylococcus aureus (MRSA).            Imaging, reviewed and are significant for:    CT    Signed by: Marisue Manila, MD             [1]   Current Facility-Administered Medications   Medication Dose Route Frequency Last Rate   [2]   Current Facility-Administered Medications   Medication Dose Route    acetaminophen   650 mg Oral    albuterol   2.5 mg Nebulization    alum & mag hydroxide-simethicone   30 mL Oral    bengay greaseless   Topical    benzocaine -menthol   1 lozenge Buccal    benzonatate   200 mg  Oral    bisacodyl   10 mg Oral    butalbital -acetaminophen -caffeine   1 tablet Oral    carboxymethylcellulose sodium  1 drop Both Eyes    dextrose   15 g of glucose Oral    Or    dextrose   12.5 g Intravenous    Or    dextrose   12.5 g Intravenous    Or    glucagon  (rDNA)  1 mg Intramuscular    ketorolac   30 mg Intravenous    lidocaine  viscous  5 mL Swish & Spit    Lubrifresh PM   Both Eyes    magnesium  sulfate  1 g Intravenous    naloxone   0.2 mg Intravenous    ondansetron   8 mg Oral    Or    ondansetron   4 mg Intravenous    potassium & sodium phosphates   2 packet Oral    potassium chloride   0-60 mEq Oral    Or    potassium chloride   0-60 mEq Oral    Or    potassium chloride   10 mEq Intravenous    saline  2 spray Each Nare    SUMAtriptan   100 mg Oral    traMADol   50 mg Oral

## 2023-04-07 NOTE — Plan of Care (Addendum)
 Gastroenterology    PLAN OF CARE NOTE  Gastroenterology Consult Service  Epic Chat (Group): [facility]      Discussed plan of care with GI attending, Dr. Abagail    Planned EUS w FNB rescheduled for tomorrow due to pathology unavailable this afternoon for procedure.     PLAN:  - Plan for EUS w FNB tomorrow with pathology present in OR due to difficulty extubate post-procedure 3/7  - CLD today, NPO midnight  - Hold am lovenox     Preparation for endoscopy: (criteria for safe sedation)  - K > 3.5 and < 5.5  - Na > 125 and < 145  - HgB > 7, and > 8 for cardiac history  - Oxygen saturation of 95% on 3 liters of oxygen or less  - platelets >50  - INR 1.5-1.7 pending procedure

## 2023-04-07 NOTE — Nursing Progress Note (Addendum)
 Shift Note: arrived to APU at 1210. VSS on arrival and dual skin check completed. PT NPO for EDG pending for 1600.       1625: RN notified that EGD would be postponed until tomorrow and pt could resume CLD. Pt made aware of the changes and she made it clear that she would not stay until tomorrow and would be going home. MD aware and came to bedside. Pt not willing to reconsider. AMA forms signed, Imaging from this admission was provided on a disk per pt request and pts brother picked her up to go home. Ivs removed and home medications returned to patient.

## 2023-04-07 NOTE — Student Progress (Signed)
 Cardiac Intensive Care Unit (CICU)  Progress Note      Patient Name: Valerie May  MRN: [id number]  Room: FI300/FI300.01    ICU Day: Patient does not have an ICU stay during this admission.    Attending Attestation:       As the physician signing this note, I have reviewed the history and exam and pertinent imaging results. My addendum revises, addends or confirms the recommendations or plans in this note. I discussed with the plan of care with the APP/Resident and I agree with the findings noted unless revised or updated in my notations. I formulated all of the medical decision making noted in the assessment & plan.         Interval Events   24 hour events:  Refusing PO meds this morning due to nausea. Several episodes of nausea/vomiting overnight.     Subjective   Hospital Course:   Ms. Souders is a 53 y.o. female w/ PMHx asthma, eosinophilia, bronchiectasis, recurrent PNA, GERD, DM2, polycythemia, obesity. Admitted to hospital on 2/22 w/ N&V, abdominal pain and altered mental status. Noted to be hypercarbic on VBG, [facility] consulted, recommended to start patient on nocturnal CPAP for OHS and avoid narcotics. 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 GI consulted. EGD / EUS w/ fine needle biopsy performed on 2/26. During this procedure te patient was extubated to CPAP w/ NPA. 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 Oncology recommended to repeat biopsy. Today patient was taken for repeat EGD for EUS w/ biopsy but could not be completed due to large amount of food in the stomach. Anesthesia was unable to extubate her due to low Vt and high ETCO2. Called [facility] for admission.      3/7: Transferred to CICU post EGD and aborted biopsy due to large amount of food in stomach. Extubated in CICU  3/8: Downgraded to floor, awaiting bed availability. Lasix  20mg  X 1   3/9: EGD aborted due to food in stomach  3/10: multiple episodes of emesis  3/11: EGD/EUS this afternoon        Assessment & Plan   Assessment and Plan   Summary: MIANGEL FLOM a 53 y.o. female who presents following EGD unable to extubate in GI due to acute hypercarbic respiratory failure, low tidal volumes, and elevated CO2. She remains in ICU awaiting downgrade pending bed availability. .      System (Problem List) Plan   NEUROLOGICAL:  Migraines, hx  Body pain  Acute metabolic encepalopathy (resolved)    Current PT Order: No  Current OT Order: No Migraines  No headache this AM  PRN Fioricet , sumatriptan , tramadol , APAP for migraine tx  Avoiding narcotics given concern for gastroparesis    Delirium precautions including maintenance of day/night wake cycles, frequent reorientation as needed  Will need sleep study as OP   CARDIOVASCULAR:   No active issues    Ejection Fraction          03/21/2023    13:52   Ejection Fraction   Ejection Fraction 63     Echo 2/25   Maintain MAP > 65 mmHg, SBP < 160   PULMONARY:   Acute hypoxic respiratory failure  Acute on chronic hypercarbic respiratory failure  Obesity hypoventilation  Obstructive sleep apnea  Eosinophilic asthma, hx    Last vent settings if on vent:  FiO2 (%):  [24 %] 24 %  PEEP/EPAP:  [8 cm H20] 8 cm H20  Ventilator Time (if  on vent):   3d 21h  Acute hypoxic respiratory failure  Acute on chronic hypercarbic respiratory failure  Obesity hypoventilation  OSA  Elevated bicarb in setting of chronic hypercarbia  Supplemental oxygen as needed to keep sat > 90%  CPAP overnight as needed, PEEP 8    Eosinophilic asthma, hx  Continue Symbicort , cetirizine , Singulair , Incruse Ellipta  as scheduled   Will consult [facility] for further pulm/autoimmune work up   RENAL:   No acute issues      Intake/Output Summary (Last 24 hours) at 04/07/2023 1047  Last data filed at 04/07/2023 0400  Gross per 24 hour   Intake 598 ml   Output 700 ml   Net -102 ml    Cr 0.7  Allow autodiuresis  Monitor BMP/Mg on daily labs  Electrolyte protocol ordered   GASTROINTESTINAL:  GERD  Pancreatic Mass    Candida Esophagitis  N/V/D  Concern for Gastroparesis    Last Bowel Movement:   Last BM Date: 04/06/23 Pancreatic mass  EGD today 3/11 at 1615  Tumor markers negative from EGD and fine needle biopsy from 2/26, repeating tissue sample today  Nutrition: NPO for procedure  Monitor LFTs on daily labs  Hepatic steatosis noted on MRI    Candida esophagitis  Likely secondary to inhaled corticosteroids (Symbicort )  Last dose of fluconazole  today    N/V/D  C/f gastroparesis  GERD  Bowel Regimen: none ordered d/t diarrhea  PRN ondansetron  for nausea  Continue pantoprazole  40 mg     INFECTIOUS DISEASE:   No acute issues    Temp (24hrs), Avg:97.7 F (36.5 C), Min:97.5 F (36.4 C), Max:98 F (36.7 C)     1 mL  Lab Results   Component Value Date    WBC 8.91 04/07/2023      CAUTI Prevention (n/a if blank):  Foley Day:         Blood Steam Infection Prevention (n/a if blank):    Monitor CBC on daily labs  PRN APAP for fever  3/6 RPP negative          HAI Prevention Plan  N/A - No indwelling foley catheter  N/A - No central line/PICC in place       HEME/ONC:   Risk for DVT  Suspicion of pancreatic malignancy   Risk for DVT  SCDs ordered  DVT chemoprophylaxis: enoxaparin  (LOVENOX ) syringe 40 mg (held for procedure today)  PT/INR 12.9/1.1 on AM labs    Suspicion of pancreatic malignancy  Heme/Onc following  Tumor markers from biopsy 2/26 negative, although initial smear revealed neoplasm  Will re-biopsy today     ENDO/RHEUM:  No active issues    Hgb A1c 6.0  SSI not indicated at this time       Current Infusions (n/a if blank):            Advance Care Planning: Open ACP Navigator  Code Status: Full Code  Next of Kin:  Primary Emergency Contact: Debarge,Sanros Dorn           ADVANCE CARE PLAN                     Objective      Examination:   Temp:  [97.5 F (36.4 C)-98 F (36.7 C)] 97.8 F (36.6 C)  Heart Rate:  [91-109] 103  Resp Rate:  [26-34] 28  BP: (111-133)/(64-83) 128/79  FiO2 (%):  [24 %] 24 %  Neuro:    Non-focal  neuro exam alert &  oriented x 3     Lungs:   clear to auscultation, no wheezes, rales or rhonchi, symmetric air entry     Cardiac:   normal rate, regular rhythm, normal S1, S2, no murmurs, rubs, clicks or gallops     Abdomen:    soft, nontender, nondistended, no masses or organomegaly     Extremities:   peripheral pulses normal    Skin:     Warm, dry and intact   Fick/Cardiac Hemodynamics   VO2 Determination  Height: 149.9 cm (4' 11)                   SVR Determination  MAP (mmHg): 99       Most Recent Labs  8.91 \ 14.0 / 416*    / 41.9 \    03/11 0439    140 102 9 / 96   3.3* 29 0.7 \    03/11 0439              Additional Diagnoses:     Patient has a BMI of 34.13 kg/m2    Diagnosis: Class 1 Obesity: BMI of 30 to 34.9      Recent Labs     04/07/23  0439 04/06/23  1633 04/06/23  0422 04/05/23  0337   Potassium 3.3* 3.8 3.3* 3.6     Diagnosis: Hypokalemia

## 2023-04-07 NOTE — Anesthesia Preprocedure Evaluation (Signed)
 Anesthesia Evaluation    AIRWAY    Mallampati: IV    TM distance: >3 FB  Neck ROM: full  Mouth Opening:full   CARDIOVASCULAR    regular       DENTAL    no notable dental hx               PULMONARY    clear to auscultation     OTHER FINDINGS                                        Relevant Problems   ANESTHESIA   (+) Obstructive sleep apnea (adult) (pediatric)      PULMONARY   (+) Obstructive sleep apnea (adult) (pediatric)      NEURO/PSYCH   (+) Intractable chronic migraine without aura   (+) Ischemic cerebrovascular accident (CVA) (CMS/HCC)      CARDIO   (+) Intractable chronic migraine without aura      GI   (+) GERD (gastroesophageal reflux disease)       PSS Anesthesia Comments: 67F  BMI 34  Asthma, eosinophilia, bronchiectasis, recurrent PNA, suspected OSA, GERD, DM2, polycythemia, obesity. Admitted to hospital on 2/22 w/ N&V, abdominal pain and altered mental status. 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 EGD / EUS w/ fine needle biopsy performed on 2/26. During this procedure the patient was extubated to CPAP w/ NPA. Repeat EGD for EUS w/ biopsy 3/7/ 25 but could not be completed due to large amount of food in the stomach. Pt unable to be extubated her due to low Vt and high ETCO2  TTE 02/2023 EF 63%, nl RV function  04/07/23  Hct 41.9  Plt 416          Anesthesia Plan    ASA 3     general                     intravenous induction           Post op pain management: per surgeon    informed consent obtained      pertinent labs reviewed               Signed by: Donnice ONEIDA Mound, MD 04/07/23 9:32 AM

## 2023-04-07 NOTE — Plan of Care (Signed)
 Problem: Pain interferes with ability to perform ADL  Goal: Pain at adequate level as identified by patient  Outcome: Progressing  Flowsheets (Taken 04/05/2023 2000 by Joannie Coil, RN)  Pain at adequate level as identified by patient: Identify patient comfort function goal     Problem: Side Effects from Pain Analgesia  Goal: Patient will experience minimal side effects of analgesic therapy  Outcome: Progressing  Flowsheets (Taken 04/05/2023 2000 by Joannie Coil, RN)  Patient will experience minimal side effects of analgesic therapy: Monitor/assess patient's respiratory status (RR depth, effort, breath sounds)     Problem: Day of Admission - Heart Failure  Goal: Heart Failure Admission  Outcome: Progressing  Flowsheets (Taken 04/02/2023 1049 by Onita Dixon, RN)  Heart Failure Admission:   Vital signs and telemetry per policy   Strict Intake/Output     Problem: Everyday - Heart Failure  Goal: Stable Vital Signs and Fluid Balance  Outcome: Progressing  Flowsheets (Taken 04/03/2023 2100 by Dorrene Salen, RN)  Stable Vital Signs and Fluid Balance:   Daily Standing Weights in the morning using the same scale, after using the bathroom and before breadfast.  If unable to stand, zero the bed and use the bed scale   Monitor, assess vital signs and telemetry per policy   Monitor labs and report abnormalities to physician   Fluid Restriction   Strict Intake/Output   Assess for swelling/edema   Wean oxygen as needed if appropriate  Goal: Mobility/Activity is Maintained at Optimal Level for Patient  Outcome: Adequate for Discharge  Flowsheets (Taken 03/27/2023 0354 by Erla Drone, RN)  Mobility/Activity is Maintained at Optimal Level for Patient:   Increase mobility as tolerated/progressive mobility protocol   Assess for changes in respiratory status, level of consciousness and/or development of fatigue  Note: Patient ambulatory in room.  Goal: Nutritional Intake is Adequate  Outcome: Progressing  Flowsheets (Taken 04/03/2023  2100 by Dorrene Salen, RN)  Nutritional Intake is Adequate:   Fluid Restricction if needed   Patient and family teaching on low sodium diet   Encourage/perform oral hygiene as appropriate   Assess appetite,anorexia and amount of meal/food tolerated   Consult/Collaborate with Nutritionist  Note: Patient refuse Nutritional supplements at this time due to nausea.  Goal: Teaching-Using CHF Warning Zones and Educational Videos  Outcome: Progressing  Flowsheets (Taken 03/26/2023 0301 by Erla Drone, RN)  Teaching-Using CHF Warning Zones and Educational Videos:   Signs & Symptoms of CHF   Medications     Problem: Day of Discharge - Heart Failure  Goal: Discharge Education  Outcome: Progressing  Flowsheets (Taken 03/26/2023 0301 by Erla Drone, RN)  Day of Discharge:   After Visit Summary (Discharge Instuctions) with medications   CHF Warning Zones and when to call for help   Follow-up appointments   Daily Standing Weights   Low Sodium Diet     Problem: Compromised Activity/Mobility  Goal: Activity/Mobility Interventions  Outcome: Adequate for Discharge  Flowsheets (Taken 04/06/2023 2100)  Activity/Mobility Interventions: Pad bony prominences, TAP Seated positioning system when OOB, Promote PMP, Reposition q 2 hrs / turn clock, Offload heels     Problem: Compromised Sensory Perception  Goal: Sensory Perception Interventions  Outcome: Progressing  Flowsheets (Taken 04/06/2023 2100)  Sensory Perception Interventions: Offload heels, Pad bony prominences, Reposition q 2hrs/turn Clock, Q2 hour skin assessment under devices if present     Problem: Compromised Moisture  Goal: Moisture level Interventions  Outcome: Progressing  Flowsheets (Taken 04/06/2023 2100)  Moisture level Interventions: Moisture wicking products,  Moisture barrier cream     Problem: Compromised Nutrition  Goal: Nutrition Interventions  Outcome: Not Progressing     Problem: Compromised Friction/Shear  Goal: Friction and Shear Interventions  Outcome:  Progressing  Flowsheets (Taken 04/06/2023 2100)  Friction and Shear Interventions: Pad bony prominences, Off load heels, HOB 30 degrees or less unless contraindicated, Consider: TAP seated positioning, Heel foams     Problem: Moderate/High Fall Risk Score >5  Goal: Patient will remain free of falls  Outcome: Progressing  Flowsheets (Taken 04/06/2023 2000)  Moderate Risk (6-13):   MOD-Consider activation of bed alarm if appropriate   MOD-Apply bed exit alarm if patient is confused   MOD-Floor mat at bedside (where available) if appropriate   MOD-Consider a move closer to Nurses Station   MOD-Remain with patient during toileting   MOD-Place bedside commode and assistive devices out of sight when not in use     Problem: Safety  Goal: Patient will be free from injury during hospitalization  Outcome: Progressing  Flowsheets (Taken 03/31/2023 1055 by Jerrye Karna LABOR, RN)  Patient will be free from injury during hospitalization:   Assess patient's risk for falls and implement fall prevention plan of care per policy   Provide and maintain safe environment   Use appropriate transfer methods   Ensure appropriate safety devices are available at the bedside   Hourly rounding   Assess for patients risk for elopement and implement Elopement Risk Plan per policy  Goal: Patient will be free from infection during hospitalization  Outcome: Progressing  Flowsheets (Taken 04/07/2023 0243)  Free from Infection during hospitalization:   Assess and monitor for signs and symptoms of infection   Monitor lab/diagnostic results   Monitor all insertion sites (i.e. indwelling lines, tubes, urinary catheters, and drains)   Encourage patient and family to use good hand hygiene technique     Problem: Pain  Goal: Pain at adequate level as identified by patient  Outcome: Progressing  Flowsheets (Taken 04/05/2023 2000 by Joannie Coil, RN)  Pain at adequate level as identified by patient: Identify patient comfort function goal     Problem: Discharge  Barriers  Goal: Patient will be discharged home or other facility with appropriate resources  Outcome: Progressing  Flowsheets (Taken 04/07/2023 0243)  Discharge to home or other facility with appropriate resources:   Provide appropriate patient education   Provide information on available health resources   Initiate discharge planning     Problem: Psychosocial and Spiritual Needs  Goal: Demonstrates ability to cope with hospitalization/illness  Outcome: Progressing  Flowsheets (Taken 03/30/2023 0107 by Lavonia Kast, RN)  Demonstrates ability to cope with hospitalizations/illness:   Encourage verbalization of feelings/concerns/expectations   Provide quiet environment   Assist patient to identify own strengths and abilities   Encourage patient to set small goals for self     Problem: Non-Violent Restraints Interdisciplinary Plan  Goal: Will be injury free during the use of non-violent restraints  Outcome: Progressing  Flowsheets (Taken 04/05/2023 1449 by Terris Dries, RN)  Will be injury free during the use of non-violent restraints:   Attempt all alternatives before use of restraints   Initiate least restrictive type of restraint that is effective   Provide and maintain safe environment   Notify family of initiation of restraints   Provide debriefing as soon as possible and appropriate   Reassess need for continued restraints

## 2023-04-07 NOTE — Nursing Progress Note (Signed)
 4 eyes in 4 hours pressure injury assessment note:      RN or CNA Completed with: [facility]           Bony Prominences: Check appropriate box below     If wound is present add wound to LDA avatar      Occiput:   [x]  WNL   []  Wound present    Face:                     [x]  WNL    []  Wound present    Ears:      [x]  WNL   []  Wound present    Spine:    [x]  WNL   []  Wound present    Shoulders:    [x]  WNL    []  Wound present    Elbows:    [x]  WNL    []  Wound present    Sacrum/coccyx:   [x]  WNL    []  Wound present    Ischial Tuberosity:  [x]  WNL  []  Wound present    Trochanter/Hip:       [x]  WNL  []  Wound present    Knees:     [x]  WNL  []  Wound present    Ankles:     [x]  WNL  []  Wound present    Heels:    [x]  WNL  []  Wound present      Other pressure areas: N/a     Wound Location: N/A      Device related: []  Device:       Consult WOCN for guidance & staging of pressure injuries.

## 2023-04-07 NOTE — Progress Notes (Signed)
 PULMONARY INPATIENT PROGRESS NOTE  [phone] (MD Spectralink #1) / [phone] (MD Spectralink #2) / [phone] (PA Spectralink)        Date Time: 04/07/23 12:55 PM  Patient Name: Valerie May      Assessment:     1.  Acute hypoxic and hypercarbic respiratory failure: Initially thought to be due to aspiration pneumonia, possible OHS and IV narcotics given in the ER for intractable abdominal pain  -Initial ABG 7.2 4/65/28   -on admission labs notable for elevated hematocrit of 46.4 with a white count of 17.25  -Required intubation and was unable to be extubated after EGD/EUS 3/7, eventually extubated the same day      2.  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  Possible OSA/OHS  -elevated hematocrit of 46.4 on admit     4.  Pancreatic head mass  -Tumor markers not elevated     5. 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 Also past history of Tb though no definite record of this     6.  COVID-19 pneumonia 2021     7.  Prior hernia repair, cholecystectomy, hysterectomy     8.  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 Gastroparesis?     10. 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    Edin Skarda C Elvin Mccartin, [place]  04/07/23  12:55 PM      Interval History     Interval History/24 hr. Events: No acute events overnight. Afebrile. On room air. Denies fevers, chills, cough or chest pain. Reports abdominal pain and several episodes  of nausea/vomiting overnight.       Review of Systems:   Reviewed and no changes      Physical Exam:     VITAL SIGNS PHYSICAL EXAM   Vitals:    04/07/23 1210   BP: 145/85   Pulse: 97   Resp: 22   Temp: 98.5 F (36.9 C)   SpO2: 95%     Temp (24hrs), Avg:97.8 F (36.6 C), Min:97.5 F (36.4 C), Max:98.5 F (36.9 C)      Intake and Output Summary (Last 24 hours) at Date Time    Intake/Output Summary (Last 24 hours) at 04/07/2023 1255  Last data filed at 04/07/2023 0400  Gross per 24 hour   Intake 598 ml   Output 700 ml   Net -102 ml             SpO2: 95 % (04/07/2023 12:10 PM)  O2 Device: None (Room air) (04/07/2023 12:10 PM)  FiO2 (%): 24 % (2L BLEED IN) (04/07/2023  2:03 AM)  O2 Flow Rate (L/min): 2 L/min (04/07/2023  8:00 AM)  Oximetry Probe Site Changed: No (04/06/2023  1:00 AM)     Physical Exam  General: awake, alert, breathing  comfortably, no acute distress  Cardiovascular: regular rate and rhythm, normal S1, S2, no S3, no S4, no murmurs, rubs or gallops  Neck: No JVD  Lungs: CTAB, good air movement, no wheeze or rhonchi, no crackles, good inspiratory effort  Abdomen: soft, non-tender, non-distended; no palpable masses,  normoactive bowel sounds  Extremities: no edema  Pulse: equal pulses,  Neurological: Alert and oriented X3, mood and affect normal         Meds:     Current Facility-Administered Medications   Medication Dose Route Frequency    budesonide -formoterol   2 puff Inhalation BID    cetirizine   10 mg Oral Daily    [Held by provider] enoxaparin   40 mg Subcutaneous Daily    fluconazole   400 mg Oral Q24H SCH    fluocinonide    Topical BID    lidocaine   2 patch Transdermal Q24H    montelukast   10 mg Oral QHS    pantoprazole   40 mg Oral Q12H [facility]    umeclidinium bromide   1 puff Inhalation QAM    venlafaxine   25 mg Oral TID MEALS       Labs:     Recent Labs   Lab 04/07/23  0439 04/06/23  1633 04/06/23  0422 04/05/23  0337 04/04/23  0352   WBC 8.91  --  8.08 8.99 9.26   RBC 4.74  --  4.55 4.36 4.23   Hemoglobin 14.0   --  13.3 13.1 12.7   Hematocrit 41.9  --  40.8 40.2 39.4   Glucose 96 112* 97 97 104*   BUN 9 7 6* 6* 9   Creatinine 0.7 0.5 0.6 0.5 0.6   Calcium  9.7 9.4 9.4 9.1 8.9   Sodium 140 141 140 137 137   Potassium 3.3* 3.8 3.3* 3.6 3.9   Chloride 102 105 98* 96* 97*   CO2 29 26 31* 31* 31*           Imaging:         Outside imaging and imaging available in Epic were directly reviewed     Radiology Results (24 Hour)       ** No results found for the last 24 hours. **              Signed by: Loris Winrow C Jathan Balling, PA     Pulmonology  Spectra  [phone]

## 2023-04-08 ENCOUNTER — Encounter
Admission: EM | Disposition: A | Discharge status: Home / Self Care | Payer: Self-pay | Source: Home / Self Care | Attending: Hospitalist

## 2023-04-08 ENCOUNTER — Other Ambulatory Visit: Payer: Self-pay

## 2023-04-08 ENCOUNTER — Inpatient Hospital Stay
Admission: EM | Admit: 2023-04-08 | Discharge: 2023-04-16 | DRG: 694 | Disposition: A | Discharge status: Home / Self Care | Attending: Student in an Organized Health Care Education/Training Program | Admitting: Student in an Organized Health Care Education/Training Program

## 2023-04-08 ENCOUNTER — Emergency Department

## 2023-04-08 ENCOUNTER — Encounter (INDEPENDENT_AMBULATORY_CARE_PROVIDER_SITE_OTHER): Payer: Self-pay

## 2023-04-08 DIAGNOSIS — J45909 Unspecified asthma, uncomplicated: Secondary | ICD-10-CM | POA: Diagnosis present

## 2023-04-08 DIAGNOSIS — E440 Moderate protein-calorie malnutrition: Secondary | ICD-10-CM | POA: Diagnosis present

## 2023-04-08 DIAGNOSIS — K3184 Gastroparesis: Secondary | ICD-10-CM | POA: Diagnosis present

## 2023-04-08 DIAGNOSIS — G4733 Obstructive sleep apnea (adult) (pediatric): Secondary | ICD-10-CM | POA: Diagnosis present

## 2023-04-08 DIAGNOSIS — E66811 Obesity, class 1: Secondary | ICD-10-CM | POA: Diagnosis present

## 2023-04-08 DIAGNOSIS — R109 Unspecified abdominal pain: Principal | ICD-10-CM | POA: Diagnosis present

## 2023-04-08 DIAGNOSIS — K8689 Other specified diseases of pancreas: Secondary | ICD-10-CM | POA: Insufficient documentation

## 2023-04-08 DIAGNOSIS — Z8616 Personal history of COVID-19: Secondary | ICD-10-CM

## 2023-04-08 DIAGNOSIS — Z800 Family history of malignant neoplasm of digestive organs: Secondary | ICD-10-CM

## 2023-04-08 DIAGNOSIS — K5909 Other constipation: Secondary | ICD-10-CM | POA: Diagnosis present

## 2023-04-08 DIAGNOSIS — Z20822 Contact with and (suspected) exposure to covid-19: Secondary | ICD-10-CM | POA: Diagnosis present

## 2023-04-08 DIAGNOSIS — E872 Acidosis, unspecified: Secondary | ICD-10-CM | POA: Diagnosis present

## 2023-04-08 DIAGNOSIS — K219 Gastro-esophageal reflux disease without esophagitis: Secondary | ICD-10-CM | POA: Diagnosis present

## 2023-04-08 DIAGNOSIS — D75839 Thrombocytosis, unspecified: Secondary | ICD-10-CM | POA: Diagnosis present

## 2023-04-08 DIAGNOSIS — R079 Chest pain, unspecified: Secondary | ICD-10-CM

## 2023-04-08 DIAGNOSIS — R197 Diarrhea, unspecified: Secondary | ICD-10-CM | POA: Diagnosis present

## 2023-04-08 DIAGNOSIS — D3A8 Other benign neuroendocrine tumors: Principal | ICD-10-CM | POA: Diagnosis present

## 2023-04-08 DIAGNOSIS — Z7984 Long term (current) use of oral hypoglycemic drugs: Secondary | ICD-10-CM

## 2023-04-08 DIAGNOSIS — Z7951 Long term (current) use of inhaled steroids: Secondary | ICD-10-CM

## 2023-04-08 DIAGNOSIS — E1143 Type 2 diabetes mellitus with diabetic autonomic (poly)neuropathy: Secondary | ICD-10-CM | POA: Diagnosis present

## 2023-04-08 DIAGNOSIS — D72829 Elevated white blood cell count, unspecified: Secondary | ICD-10-CM | POA: Diagnosis not present

## 2023-04-08 DIAGNOSIS — Z6834 Body mass index (BMI) 34.0-34.9, adult: Secondary | ICD-10-CM

## 2023-04-08 DIAGNOSIS — R112 Nausea with vomiting, unspecified: Secondary | ICD-10-CM

## 2023-04-08 DIAGNOSIS — Z79899 Other long term (current) drug therapy: Secondary | ICD-10-CM

## 2023-04-08 LAB — LAB USE ONLY - CBC WITH DIFFERENTIAL
Absolute Basophils: 0.09 10*3/uL — ABNORMAL HIGH (ref 0.00–0.08)
Absolute Eosinophils: 0.23 10*3/uL (ref 0.00–0.44)
Absolute Immature Granulocytes: 0.03 10*3/uL (ref 0.00–0.07)
Absolute Lymphocytes: 3.21 10*3/uL (ref 0.42–3.22)
Absolute Monocytes: 0.97 10*3/uL — ABNORMAL HIGH (ref 0.21–0.85)
Absolute Neutrophils: 4.36 10*3/uL (ref 1.10–6.33)
Absolute nRBC: 0 10*3/uL (ref <=0.00)
Basophils %: 1 %
Eosinophils %: 2.6 %
Hematocrit: 42.1 % (ref 34.7–43.7)
Hemoglobin: 14.4 g/dL (ref 11.4–14.8)
Immature Granulocytes %: 0.3 %
Lymphocytes %: 36.1 %
MCH: 29.9 pg (ref 25.1–33.5)
MCHC: 34.2 g/dL (ref 31.5–35.8)
MCV: 87.5 fL (ref 78.0–96.0)
MPV: 9.6 fL (ref 8.9–12.5)
Monocytes %: 10.9 %
Neutrophils %: 49.1 %
Platelet Count: 398 10*3/uL — ABNORMAL HIGH (ref 142–346)
Preliminary Absolute Neutrophil Count: 4.36 10*3/uL (ref 1.10–6.33)
RBC: 4.81 10*6/uL (ref 3.90–5.10)
RDW: 12 % (ref 11–15)
WBC: 8.89 10*3/uL (ref 3.10–9.50)
nRBC %: 0 /100{WBCs} (ref <=0.0)

## 2023-04-08 LAB — LACTIC ACID: Whole Blood Lactic Acid: 1.7 mmol/L (ref 0.2–2.0)

## 2023-04-08 SURGERY — ESOPHAGOGASTRODUODENOSCOPY (EGD), ESOPHAGEAL ULTRASOUND (EUS) LIMITED TO THE ESOPHAGUS, STOMACH OR DUODENUM
Anesthesia: General | Site: Abdomen

## 2023-04-08 SURGERY — ESOPHAGOGASTRODUODENOSCOPY (EGD), ESOPHAGEAL ULTRASOUND (EUS) LIMITED TO THE ESOPHAGUS, STOMACH OR DUODENUM
Anesthesia: General

## 2023-04-08 MED ORDER — MORPHINE SULFATE 4 MG/ML IJ/IV SOLN (WRAP)
4.0000 mg | Freq: Once | Status: AC
Start: 2023-04-08 — End: 2023-04-08
  Administered 2023-04-08: 4 mg via INTRAVENOUS
  Filled 2023-04-08: qty 1

## 2023-04-08 MED ORDER — ONDANSETRON HCL 4 MG/2ML IJ SOLN
4.0000 mg | Freq: Once | INTRAMUSCULAR | Status: AC
Start: 2023-04-08 — End: 2023-04-08
  Administered 2023-04-08: 4 mg via INTRAVENOUS
  Filled 2023-04-08: qty 2

## 2023-04-08 NOTE — Progress Notes (Signed)
 [facility] [employer] [facility]):     Referral received to schedule an appointment with the [facility] for [facility], however patient is unable to be contacted. Writer could not leave a voicemail at the number on file.    Suan Sam  Program Coordinator  [facility] for [employer]   P: [phone]  F: [phone]

## 2023-04-08 NOTE — EDIE (Signed)
 [facility] NOTIFICATION 04/08/2023 21:50 Valerie May, Valerie May DOB: 10-09-1970 MRN: [id number]    Criteria Met      5 ED Visits in 12 Months    Security and Safety  No Security Events were found.  ED Care Guidelines  There are currently no ED Care Guidelines for this patient. Please check your facility's medical records system.        Prescription Monitoring Program  Narx Score not available at this time.    E.D. Visit Count (12 mo.)  Facility Visits   [facility] 2   [facility] 2   [facility] 1   Total 5   Note: Visits indicate total known visits.     Recent Emergency Department Visit Summary  Date Facility [facility] Type Diagnoses or Chief Complaint    Apr 08, 2023  [facility]  [place]  Emergency      Amb 434: Abd pain      Mar 21, 2023  [facility]  [place]  Emergency      Generalized abdominal pain      Shortness of Breath      Flu like symptoms      Chest Pain      CP/ SOB      Nov 05, 2022  [facility]  [place]  Emergency     Nov 01, 2022  [facility]  [place]  Emergency      Generalized anxiety disorder      Subacute cough      Chest pain, unspecified      Shortness of breath      Sep 25, 2022  [facility]  [place]  [place]  Emergency      Exposure to other specified factors, initial encounter      Contusion of other part of head, initial encounter      Bronchitis, not specified as acute or chronic        Recent Inpatient Visit Summary  Date Facility [facility] Type Diagnoses or Chief Complaint    Mar 21, 2023  [facility]  [place]    [facility]      Disease of pancreas, unspecified      Nausea with vomiting, unspecified      Other specified diseases of pancreas      Generalized abdominal pain      Nov 05, 2022  [facility]  [place]    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  [facility]  This patient has registered at the [facility] Emergency Department  For more information visit: [email or web address]     PLEASE NOTE:  1.   Any care recommendations and other clinical information are provided as guidelines or for historical purposes only, and providers should exercise their own clinical judgment when providing care.    2.   You may only use this information for purposes of treatment, payment or health care operations activities, and subject to the limitations of applicable [facility] Policies.    3.   You should consult directly with the organization that provided a care guideline or other clinical history with any questions about additional information or accuracy or completeness of information provided.    ? 2025 [facility] - www.[email or web address]

## 2023-04-08 NOTE — ED Triage Notes (Signed)
 Stretcher from triage. Pt reports recent hospital admission, d/c yesterday. Was suppose to have a 2nd biopsy of the pancreas but did not happen. Sent home yesterday, started having epigastric pain, nausea and dry heaving, loose watery stools, and subjective fever

## 2023-04-09 ENCOUNTER — Emergency Department

## 2023-04-09 ENCOUNTER — Encounter: Payer: Self-pay | Admitting: Internal Medicine

## 2023-04-09 ENCOUNTER — Telehealth (INDEPENDENT_AMBULATORY_CARE_PROVIDER_SITE_OTHER): Payer: Self-pay

## 2023-04-09 DIAGNOSIS — K8689 Other specified diseases of pancreas: Secondary | ICD-10-CM

## 2023-04-09 DIAGNOSIS — R112 Nausea with vomiting, unspecified: Secondary | ICD-10-CM

## 2023-04-09 DIAGNOSIS — R197 Diarrhea, unspecified: Secondary | ICD-10-CM

## 2023-04-09 DIAGNOSIS — R109 Unspecified abdominal pain: Principal | ICD-10-CM | POA: Diagnosis present

## 2023-04-09 LAB — LIPASE: Lipase: 37 U/L (ref 8–78)

## 2023-04-09 LAB — URINALYSIS WITH REFLEX TO MICROSCOPIC EXAM - REFLEX TO CULTURE
Urine Bilirubin: NEGATIVE
Urine Blood: NEGATIVE
Urine Glucose: NEGATIVE
Urine Ketones: NEGATIVE mg/dL
Urine Nitrite: NEGATIVE
Urine Specific Gravity: 1.026 (ref 1.001–1.035)
Urine Urobilinogen: 2 mg/dL (ref 0.2–2.0)
Urine pH: 8 (ref 5.0–8.0)

## 2023-04-09 LAB — HEPATIC FUNCTION PANEL (LFT)
ALT: 54 U/L (ref <=55)
AST (SGOT): 30 U/L (ref <=41)
Albumin/Globulin Ratio: 1.5 (ref 0.9–2.2)
Albumin: 4.2 g/dL (ref 3.5–5.0)
Alkaline Phosphatase: 138 U/L — ABNORMAL HIGH (ref 37–117)
Bilirubin Direct: 0.2 mg/dL (ref 0.0–0.5)
Bilirubin Indirect: 0.3 mg/dL (ref 0.2–1.0)
Bilirubin, Total: 0.5 mg/dL (ref 0.2–1.2)
Globulin: 2.8 g/dL (ref 2.0–3.6)
Protein, Total: 7 g/dL (ref 6.0–8.3)

## 2023-04-09 LAB — ECG 12-LEAD
Atrial Rate: 93 {beats}/min
IHS MUSE NARRATIVE AND IMPRESSION: NORMAL
P Axis: 23 degrees
P-R Interval: 130 ms
Q-T Interval: 360 ms
QRS Duration: 96 ms
QTC Calculation (Bezet): 447 ms
R Axis: 13 degrees
T Axis: 33 degrees
Ventricular Rate: 93 {beats}/min

## 2023-04-09 LAB — BASIC METABOLIC PANEL
Anion Gap: 9 (ref 5.0–15.0)
BUN: 15 mg/dL (ref 7–21)
CO2: 25 meq/L (ref 17–29)
Calcium: 10.2 mg/dL (ref 8.5–10.5)
Chloride: 107 meq/L (ref 99–111)
Creatinine: 0.6 mg/dL (ref 0.4–1.0)
GFR: >60 mL/min/{1.73_m2} (ref >=60.0)
Glucose: 118 mg/dL — ABNORMAL HIGH (ref 70–100)
Potassium: 4 meq/L (ref 3.5–5.3)
Sodium: 141 meq/L (ref 135–145)

## 2023-04-09 LAB — ETHANOL (ALCOHOL) LEVEL: Alcohol: NOT DETECTED

## 2023-04-09 LAB — LAB USE ONLY - URINE GRAY CULTURE HOLD TUBE

## 2023-04-09 LAB — STOOL OCCULT BLOOD SINGLE SPECIMEN: Stool Occult Blood Single Specimen: NEGATIVE

## 2023-04-09 LAB — C-REACTIVE PROTEIN
C-Reactive Protein: 0.3 mg/dL (ref 0.0–1.1)
C-Reactive Protein: 0.3 mg/dL (ref 0.0–1.1)

## 2023-04-09 LAB — WHOLE BLOOD GLUCOSE POCT: Whole Blood Glucose POCT: 102 mg/dL — ABNORMAL HIGH (ref 70–100)

## 2023-04-09 LAB — SEDIMENTATION RATE: Sed Rate: 35 mm/h — ABNORMAL HIGH (ref <=20)

## 2023-04-09 LAB — SERUM HCG, QUALITATIVE: hCG Qualitative: NEGATIVE

## 2023-04-09 MED ORDER — DEXTROSE 10 % IV BOLUS
12.5000 g | INTRAVENOUS | Status: AC | PRN
Start: 2023-04-09 — End: ?

## 2023-04-09 MED ORDER — ONDANSETRON HCL 4 MG/2ML IJ SOLN
4.0000 mg | Freq: Three times a day (TID) | INTRAMUSCULAR | Status: DC | PRN
Start: 2023-04-09 — End: 2023-04-09
  Administered 2023-04-09: 4 mg via INTRAVENOUS
  Filled 2023-04-09: qty 2

## 2023-04-09 MED ORDER — PANTOPRAZOLE SODIUM 40 MG IV SOLR
40.0000 mg | Freq: Every day | INTRAVENOUS | Status: AC
Start: 2023-04-09 — End: ?
  Administered 2023-04-09 – 2023-04-12 (×4): 40 mg via INTRAVENOUS
  Filled 2023-04-09 (×5): qty 40

## 2023-04-09 MED ORDER — POTASSIUM CHLORIDE 10 MEQ/100ML IV SOLN (WRAP)
10.0000 meq | INTRAVENOUS | Status: AC | PRN
Start: 2023-04-09 — End: ?

## 2023-04-09 MED ORDER — HYDROMORPHONE HCL 1 MG/ML IJ SOLN
0.4000 mg | INTRAMUSCULAR | Status: AC | PRN
Start: 2023-04-09 — End: ?
  Administered 2023-04-10 – 2023-04-14 (×8): 0.4 mg via INTRAVENOUS
  Filled 2023-04-09 (×8): qty 1

## 2023-04-09 MED ORDER — CETIRIZINE HCL 10 MG PO TABS
10.0000 mg | ORAL_TABLET | Freq: Every day | ORAL | Status: AC
Start: 2023-04-09 — End: ?
  Administered 2023-04-09 – 2023-04-16 (×8): 10 mg via ORAL
  Filled 2023-04-09 (×8): qty 1

## 2023-04-09 MED ORDER — POLYETHYLENE GLYCOL 3350 17 G PO PACK
17.0000 g | PACK | Freq: Every day | ORAL | Status: DC
Start: 2023-04-09 — End: 2023-04-09

## 2023-04-09 MED ORDER — ONDANSETRON HCL 4 MG/2ML IJ SOLN
4.0000 mg | INTRAMUSCULAR | Status: AC | PRN
Start: 2023-04-09 — End: 2023-04-10
  Administered 2023-04-09: 4 mg via INTRAVENOUS
  Filled 2023-04-09: qty 2

## 2023-04-09 MED ORDER — MELATONIN 3 MG PO TABS
3.0000 mg | ORAL_TABLET | Freq: Every evening | ORAL | Status: AC | PRN
Start: 2023-04-09 — End: ?
  Administered 2023-04-13 – 2023-04-15 (×3): 3 mg via ORAL
  Filled 2023-04-09 (×3): qty 1

## 2023-04-09 MED ORDER — POTASSIUM CHLORIDE 20 MEQ PO PACK
0.0000 meq | PACK | ORAL | Status: AC | PRN
Start: 2023-04-09 — End: ?

## 2023-04-09 MED ORDER — ONDANSETRON HCL 4 MG/2ML IJ SOLN
4.0000 mg | INTRAMUSCULAR | Status: DC | PRN
Start: 2023-04-09 — End: 2023-04-09
  Administered 2023-04-09: 4 mg via INTRAVENOUS
  Filled 2023-04-09: qty 2

## 2023-04-09 MED ORDER — OXYCODONE HCL 5 MG PO TABS
5.0000 mg | ORAL_TABLET | Freq: Four times a day (QID) | ORAL | Status: AC | PRN
Start: 2023-04-09 — End: ?

## 2023-04-09 MED ORDER — MORPHINE SULFATE 4 MG/ML IJ/IV SOLN (WRAP)
4.0000 mg | Freq: Once | Status: AC
Start: 2023-04-09 — End: 2023-04-09
  Administered 2023-04-09: 4 mg via INTRAVENOUS
  Filled 2023-04-09: qty 1

## 2023-04-09 MED ORDER — CARBOXYMETHYLCELLULOSE SOD PF 0.5 % OP SOLN
1.0000 [drp] | Freq: Three times a day (TID) | OPHTHALMIC | Status: AC | PRN
Start: 2023-04-09 — End: ?

## 2023-04-09 MED ORDER — VITAMIN D (ERGOCALCIFEROL) 1.25 MG (50000 UT) PO CAPS
50000.0000 [IU] | ORAL_CAPSULE | ORAL | Status: AC
Start: 2023-04-09 — End: ?
  Administered 2023-04-09 – 2023-04-16 (×2): 50000 [IU] via ORAL
  Filled 2023-04-09 (×2): qty 1

## 2023-04-09 MED ORDER — OXYCODONE HCL 5 MG PO TABS
10.0000 mg | ORAL_TABLET | Freq: Four times a day (QID) | ORAL | Status: AC | PRN
Start: 2023-04-09 — End: ?
  Administered 2023-04-09 – 2023-04-13 (×8): 10 mg via ORAL
  Filled 2023-04-09 (×8): qty 2

## 2023-04-09 MED ORDER — INSULIN LISPRO 100 UNIT/ML SOLN (WRAP)
1.0000 [IU] | Freq: Three times a day (TID) | Status: AC
Start: 2023-04-09 — End: ?
  Administered 2023-04-14 – 2023-04-15 (×2): 2 [IU] via SUBCUTANEOUS
  Filled 2023-04-09: qty 3
  Filled 2023-04-09 (×2): qty 6

## 2023-04-09 MED ORDER — IOHEXOL 350 MG/ML IV SOLN
100.0000 mL | Freq: Once | INTRAVENOUS | Status: AC | PRN
Start: 2023-04-09 — End: 2023-04-09
  Administered 2023-04-09: 100 mL via INTRAVENOUS

## 2023-04-09 MED ORDER — BUDESONIDE-FORMOTEROL FUMARATE 160-4.5 MCG/ACT IN AERO
2.0000 | INHALATION_SPRAY | Freq: Two times a day (BID) | RESPIRATORY_TRACT | Status: AC
Start: 2023-04-09 — End: ?
  Administered 2023-04-11 – 2023-04-16 (×10): 2 via RESPIRATORY_TRACT
  Filled 2023-04-09: qty 6

## 2023-04-09 MED ORDER — GLUCAGON 1 MG IJ SOLR (WRAP)
1.0000 mg | INTRAMUSCULAR | Status: AC | PRN
Start: 2023-04-09 — End: ?

## 2023-04-09 MED ORDER — MAGNESIUM SULFATE IN D5W 1-5 GM/100ML-% IV SOLN
1.0000 g | INTRAVENOUS | Status: AC | PRN
Start: 2023-04-09 — End: ?

## 2023-04-09 MED ORDER — BENZONATATE 100 MG PO CAPS
100.0000 mg | ORAL_CAPSULE | Freq: Three times a day (TID) | ORAL | Status: DC | PRN
Start: 2023-04-09 — End: 2023-04-09

## 2023-04-09 MED ORDER — DEXTROSE 50 % IV SOLN
12.5000 g | INTRAVENOUS | Status: AC | PRN
Start: 2023-04-09 — End: ?

## 2023-04-09 MED ORDER — MONTELUKAST SODIUM 10 MG PO TABS
10.0000 mg | ORAL_TABLET | Freq: Every evening | ORAL | Status: AC
Start: 2023-04-09 — End: ?
  Administered 2023-04-10 – 2023-04-15 (×6): 10 mg via ORAL
  Filled 2023-04-09 (×7): qty 1

## 2023-04-09 MED ORDER — SALINE SPRAY 0.65 % NA SOLN
2.0000 | NASAL | Status: AC | PRN
Start: 2023-04-09 — End: ?

## 2023-04-09 MED ORDER — SODIUM CHLORIDE 0.9 % IV SOLN
INTRAVENOUS | Status: AC
Start: 2023-04-09 — End: 2023-04-10

## 2023-04-09 MED ORDER — ALBUTEROL-IPRATROPIUM 2.5-0.5 (3) MG/3ML IN SOLN
3.0000 mL | Freq: Four times a day (QID) | RESPIRATORY_TRACT | Status: AC | PRN
Start: 2023-04-09 — End: ?

## 2023-04-09 MED ORDER — POTASSIUM & SODIUM PHOSPHATES 280-160-250 MG PO PACK
2.0000 | PACK | ORAL | Status: AC | PRN
Start: 2023-04-09 — End: ?

## 2023-04-09 MED ORDER — SENNOSIDES-DOCUSATE SODIUM 8.6-50 MG PO TABS
1.0000 | ORAL_TABLET | Freq: Two times a day (BID) | ORAL | Status: DC
Start: 2023-04-09 — End: 2023-04-09

## 2023-04-09 MED ORDER — ALBUTEROL SULFATE (2.5 MG/3ML) 0.083% IN NEBU
2.5000 mg | INHALATION_SOLUTION | Freq: Four times a day (QID) | RESPIRATORY_TRACT | Status: AC | PRN
Start: 2023-04-09 — End: ?

## 2023-04-09 MED ORDER — BENZOCAINE-MENTHOL MT LOZG (WRAP)
1.0000 | LOZENGE | OROMUCOSAL | Status: AC | PRN
Start: 2023-04-09 — End: ?
  Administered 2023-04-12: 1 via BUCCAL
  Filled 2023-04-09: qty 1

## 2023-04-09 MED ORDER — VENLAFAXINE HCL 25 MG PO TABS
25.0000 mg | ORAL_TABLET | Freq: Three times a day (TID) | ORAL | Status: AC
Start: 2023-04-09 — End: ?
  Administered 2023-04-09 – 2023-04-16 (×18): 25 mg via ORAL
  Filled 2023-04-09 (×25): qty 1

## 2023-04-09 MED ORDER — ONDANSETRON HCL 4 MG/2ML IJ SOLN
4.0000 mg | Freq: Three times a day (TID) | INTRAMUSCULAR | Status: DC | PRN
Start: 2023-04-09 — End: 2023-04-09

## 2023-04-09 MED ORDER — BENZONATATE 100 MG PO CAPS
200.0000 mg | ORAL_CAPSULE | Freq: Three times a day (TID) | ORAL | Status: AC | PRN
Start: 2023-04-09 — End: ?

## 2023-04-09 MED ORDER — ONDANSETRON HCL 4 MG/2ML IJ SOLN
4.0000 mg | Freq: Once | INTRAMUSCULAR | Status: AC
Start: 2023-04-09 — End: 2023-04-09
  Administered 2023-04-09: 4 mg via INTRAVENOUS
  Filled 2023-04-09: qty 2

## 2023-04-09 MED ORDER — GLUCOSE 40 % PO GEL (WRAP)
15.0000 g | ORAL | Status: AC | PRN
Start: 2023-04-09 — End: ?

## 2023-04-09 MED ORDER — POTASSIUM CHLORIDE CRYS ER 20 MEQ PO TBCR
0.0000 meq | EXTENDED_RELEASE_TABLET | ORAL | Status: AC | PRN
Start: 2023-04-09 — End: ?

## 2023-04-09 MED ORDER — UMECLIDINIUM BROMIDE 62.5 MCG/ACT IN AEPB
1.0000 | INHALATION_SPRAY | Freq: Every morning | RESPIRATORY_TRACT | Status: AC
Start: 2023-04-09 — End: ?
  Administered 2023-04-13 – 2023-04-16 (×4): 1 via RESPIRATORY_TRACT
  Filled 2023-04-09 (×3): qty 7

## 2023-04-09 MED ORDER — NALOXONE HCL 0.4 MG/ML IJ SOLN (WRAP)
0.2000 mg | INTRAMUSCULAR | Status: AC | PRN
Start: 2023-04-09 — End: ?

## 2023-04-09 MED ORDER — ACETAMINOPHEN 325 MG PO TABS
650.0000 mg | ORAL_TABLET | ORAL | Status: AC | PRN
Start: 2023-04-09 — End: ?
  Administered 2023-04-09: 650 mg via ORAL
  Filled 2023-04-09 (×2): qty 2

## 2023-04-09 MED ORDER — SENNOSIDES-DOCUSATE SODIUM 8.6-50 MG PO TABS
1.0000 | ORAL_TABLET | Freq: Every evening | ORAL | Status: AC
Start: 2023-04-09 — End: ?
  Administered 2023-04-09 – 2023-04-13 (×5): 1 via ORAL
  Filled 2023-04-09 (×5): qty 1

## 2023-04-09 NOTE — ED Provider Notes (Signed)
 [facility]  ATTENDING PHYSICIAN HISTORY AND PHYSICAL EXAM     Patient Name: Valerie May, Valerie May  Department:[facility]  Encounter Date:  04/08/2023  Attending Physician: Cristela Rachele LABOR, MD   Age: 53 y.o. female  Patient Room: S 21/S 21  PCP: Michiel Olam RAMAN, FNP           Diagnosis/Disposition:     Final diagnoses:   Intractable abdominal pain   Nausea and vomiting, unspecified vomiting type       ED Disposition       ED Disposition   Admit    Condition   --    Date/Time   Thu Apr 09, 2023  3:59 AM    Comment   Admitting Physician: CRISTELA RACHELE LABOR [phone]   Service:: Medicine [[id number]]   Estimated Length of Stay: > or = to 2 midnights   Tentative Discharge Plan?: Home or Self Care [1]   Does patient need telemetry?: No   Was admission to another facility considered?: No   Detail:: Other Comment: bounceback                 Follow-Up Providers (if applicable)    No follow-up provider specified.     New Prescriptions    No medications on file           Medical Decision Making:       Valerie May is a 53 y.o. female presenting for recurrent acute severe abdominal pain with nausea vomiting  Vitals reviewed by me = concerning for mild tachycardia.   Nursing notes reviewed by me agree with findings    Initial Differential Diagnosis:  Initial differential diagnosis to include but not limited to: Pancreatitis, pancreatic abscess, intra-abdominal perforation, intra-abdominal abscess, intra-abdominal bleeding, bowel obstruction, gastroparesis, colitis, cholecystitis, choledocholithiasis, cholangitis    Assessment & Plan  Pancreatic head mass and Nausea and vomiting  Pancreatic head mass with inflammation, suspicious for neuroendocrine neoplasia. Persistent inflammation on CT scan. Symptoms include nausea, vomiting, abdominal pain, and fever.  - Admit for GI evaluation.  - Perform EGD with biopsy.  - Maintain clear liquid diet, n.p.o. for now  - Provide ice chips for hydration.    Patient required multiple  doses of IV narcotic medication and IV antiemetic to help control symptoms.  I discussed case with hospitalist who is agreeable for bounce back admission for further evaluation and management and GI consultation.     Final Impression:  Recurrent nausea vomiting and upper abdominal pain  The patient was admitted and handed off to Dr Cristela. We discussed all aspects of the work-up that had been completed in the ED, any pending studies/therapies, and all clinical decision making at the time care was handed off.       Medical Decision Making  Amount and/or Complexity of Data Reviewed  Labs: ordered.  Radiology: ordered.  ECG/medicine tests: ordered.    Risk  Prescription drug management.  Decision regarding hospitalization.                            History of Presenting Illness:     Nursing Triage note: BIBA for c/o diffuse abd pain that radiates to midsternal area, N/V/D since discharge yesterday.  Patiently recently admitted for biopsy of pancreatic mass.  +subjective fever yesterday  Chief complaint: Abdominal Pain, Emesis, and Diarrhea      Valerie May is a 53 y.o. female with a pancreatic head mass  who presents with nausea, vomiting, and abdominal pain.    She was admitted to the hospital on February 22nd with nausea, vomiting, and abdominal pain. During the admission, she was found to have hypercarbic metabolic encephalopathy and was unable to be extubated. An EGD was performed due to an incidentally found pancreatic head mass, but the pathology report was initially indeterminate and suspicious for neuroendocrine neoplasia. A repeat EGD with biopsy was recommended, but she left the hospital against medical advice on March 11th due to frustration with the delay in the biopsy and the requirement to remain on a clear liquid diet.    Since leaving the hospital, she has experienced persistent nausea, vomiting, and abdominal pain. She ate a small taco, milk, and a banana, which initially seemed tolerable, but  later resulted in nausea, vomiting, diarrhea, and abdominal pain. The diarrhea was described as soft but not watery. She also experienced significant sweating, inability to sleep, and feeling febrile, although she did not measure her temperature. The symptoms persisted into the next day, with pain and subjective fever without measured temperature recurring after eating cereal, a banana, and drinking apple juice.    Prior to February, she did not experience these symptoms. 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 She has been using Zofran , which initially helped but is no longer effective. She also uses Alka Seltzer and Maalox for symptom relief.    During the review of symptoms, she reports current symptoms of sweating, chills, and epigastric pain, particularly in the upper abdomen. She wants to drink water  but is aware of the dietary restrictions. She has been using ice chips to manage her thirst.            Review of Systems:  Physical Exam:     Review of Systems    Positive and negative ROS per above and in HPI. All other systems reviewed and negative.     Pulse (!) 102  BP 125/81  Resp 20  SpO2 95 %  Temp 98 F (36.7 C)     General appearance - alert, well appearing, and in substantial distress from ongoing abdominal pain  Mental status - alert, oriented to person, place, and time  Eyes - pupils equal and reactive, extraocular eye movements intact  Mouth - mucous membranes moist, pharynx normal without lesions  Neck - supple, no significant adenopathy  Chest - clear to auscultation, no wheezes, rales or rhonchi, symmetric air entry  Heart - sinus tachycardia, regular rhythm, normal S1, S2, no murmurs, rubs, clicks or gallops  Abdomen - soft, tender diffuse abdominal pain worse in the epigastric region with some guarding, nondistended, no masses or organomegaly  Back exam - full range of motion, no tenderness,  palpable spasm or pain on motion  Neurological - alert, oriented, normal speech, no focal findings or movement disorder noted  Skin - normal coloration, no rashes, no suspicious skin lesions noted        Interpretations, Clinical Decision Tools and Critical Care:           EKG: I reviewed and independently interpreted the patient's EKG as: normal sinus at 93 with axis, intervals and segments that are normal for age.            Point of Care Ultrasound results if available =  Imaging Results    None  Procedures:   Procedures      Attestations:     Scribe Attestation: There was no scribe involved in the care of this patient.     Documentation Notes:  Parts of this note were generated by the Epic EMR system/ Dragon speech recognition and may contain inherent errors or omissions not intended by the user. Grammatical errors, random word insertions, deletions, pronoun errors and incomplete sentences are occasional consequences of this technology due to software limitations. Not all errors are caught or corrected.  My documentation is often completed after the patient is no longer under my clinical care. In some cases, the Epic EMR may pull updated results into the above documentation which may not reflect all results or information that were available to me at the time of my medical decision making.   If there are questions or concerns about the content of this note or information contained within the body of this dictation they should be addressed directly with the author for clarification.                  Tonette Neomi HERO, MD  04/09/23 [phone]

## 2023-04-09 NOTE — Plan of Care (Signed)
 Problem: Moderate/High Fall Risk Score >5  Goal: Patient will remain free of falls  Outcome: Progressing  Flowsheets (Taken 04/09/2023 0742)  Moderate Risk (6-13):   LOW-Fall Interventions Appropriate for Low Fall Risk   LOW-Anticoagulation education for injury risk   MOD-Consider activation of bed alarm if appropriate   MOD-Apply bed exit alarm if patient is confused   MOD-Floor mat at bedside (where available) if appropriate   MOD-Consider a move closer to Nurses Station   MOD-Remain with patient during toileting   MOD-Place bedside commode and assistive devices out of sight when not in use   MOD-Re-orient confused patients   MOD-Utilize diversion activities   MOD-Perform dangle, stand, walk (DSW) prior to mobilization   MOD-Use gait belt when appropriate   MOD-Request PT/OT consult order for patients with gait/mobility impairment   MOD-include family in multidisciplinary POC discussions   MOD- Consider video monitoring     Problem: Pain interferes with ability to perform ADL  Goal: Pain at adequate level as identified by patient  Outcome: Progressing  Flowsheets (Taken 04/09/2023 2008)  Pain at adequate level as identified by patient:   Identify patient comfort function goal   Assess for risk of opioid induced respiratory depression, including snoring/sleep apnea. Alert healthcare team of risk factors identified.   Reassess pain within 30-60 minutes of any procedure/intervention, per Pain Assessment, Intervention, Reassessment (AIR) Cycle   Assess pain on admission, during daily assessment and/or before any as needed intervention(s)   Evaluate if patient comfort function goal is met   Consult/collaborate with Physical Therapy, Occupational Therapy, and/or Speech Therapy   Consult/collaborate with Pain Service   Offer non-pharmacological pain management interventions   Evaluate patient's satisfaction with pain management progress   Include patient/patient care companion in decisions related to pain management as  needed     Problem: Side Effects from Pain Analgesia  Goal: Patient will experience minimal side effects of analgesic therapy  Outcome: Progressing  Flowsheets (Taken 04/09/2023 2008)  Patient will experience minimal side effects of analgesic therapy:   Monitor/assess patient's respiratory status (RR depth, effort, breath sounds)   Assess for changes in cognitive function   Prevent/manage side effects per LIP orders (i.e. nausea, vomiting, pruritus, constipation, urinary retention, etc.)   Evaluate for opioid-induced sedation with appropriate assessment tool (i.e. POSS)     Problem: Compromised Sensory Perception  Goal: Sensory Perception Interventions  Outcome: Progressing  Flowsheets (Taken 04/09/2023 2008)  Sensory Perception Interventions: Offload heels, Pad bony prominences, Reposition q 2hrs/turn Clock, Q2 hour skin assessment under devices if present     Problem: Compromised Moisture  Goal: Moisture level Interventions  Outcome: Progressing  Flowsheets (Taken 04/09/2023 2008)  Moisture level Interventions: Moisture wicking products, Moisture barrier cream     Problem: Compromised Activity/Mobility  Goal: Activity/Mobility Interventions  Outcome: Progressing  Flowsheets (Taken 04/09/2023 2008)  Activity/Mobility Interventions: Pad bony prominences, TAP Seated positioning system when OOB, Promote PMP, Reposition q 2 hrs / turn clock, Offload heels     Problem: Compromised Nutrition  Goal: Nutrition Interventions  Outcome: Progressing  Flowsheets (Taken 04/09/2023 2008)  Nutrition Interventions: Discuss nutrition at Rounds, I&Os, Document % meal eaten, Daily weights     Problem: Compromised Friction/Shear  Goal: Friction and Shear Interventions  Outcome: Progressing  Flowsheets (Taken 04/09/2023 2008)  Friction and Shear Interventions: Pad bony prominences, Off load heels, HOB 30 degrees or less unless contraindicated, Consider: TAP seated positioning, Heel foams   NURSING PROGRESS NOTE: STROKE UNIT    Patient Name:  Valerie May (53 y.o. female)  Admission Date: 04/08/2023 [facility] Day 0    Recent Labs   Lab 04/08/23  2327   Sodium 141   Potassium 4.0   Chloride 107   CO2 25   BUN 15   Creatinine 0.6   GFR >60.0   Glucose 118*   Calcium  10.2       Recent Labs   Lab 04/08/23  2327   WBC 8.89   Hemoglobin 14.4   Hematocrit 42.1   Platelet Count 398*         Patient Lines/Drains/Airways Status       Active Lines, Drains and Airways       Name Placement date Placement time Site Days    Peripheral IV 04/09/23 20 G Diffusion Left Antecubital 04/09/23  0128  Antecubital  less than 1    Peripheral IV 04/09/23 22 G Diffusion Left Hand 04/09/23  0119  Hand  less than 1                       Braden Scale Score: 21 (04/09/23 0715)      Skin Assessment: Bruising, Tattoos, Scars, Blanchable Redness, Edema (non pitting)  Bruising Skin Location: scattered         Safety Checklist   1:1 Sitter N    RVM  N       Interpreter Services:  Does the patient require an Interpreter? N    If yes, what form of interpreter services was used? N     If family was utilized, is tour manager form signed and in the chart? N      ASSESSMENT/PLAN:    Last BM: 04/09/2023    Pending Orders: GI Consult, Social Work Consult, [employer] for EUS, Blood and Urine Culture pending, Stool Occult pending.     Discharge Plan: TBD    POC / Family Update: Pt updated at Bedside    Shift Note: Pt admitted to unit in stable condition with all personal belongings from the ED via stretcher. Pt oriented to unit and made comfortable. All questions answered.     Pt A/O  X4, clear speech, spanish and english speaking at baseline, sensation intact, follows commands. Pt moves all extremities, mild generalized fatigue and unsteady gait. Pt x1 hands on assist when OOB with the walker. Pt 4/5 throughout all extremities. Pt on clear liquid diet with thin liquids and takes pills whole. Pt has poor PO intake d/t persistent nausea and vomiting. Pt had mild intermittent HA and given x1  PRN Tylenol  and persistent ice pack with good relief. Pt has persistent Nausea and x2 episodes of emesis. MD notified. Pt given x2 PRN Zofran  with little relief.     Pt transferred to [facility] Unit, Room 323 this shift. Pt sent via stretcher. Report called to receiving RN Bernardino. Pt sent in stable condition with all personal belongings. Pt made comfortable and all questions answered. Pt home med sent to security.     Pt on Contact Isolation for rule out c-diff this shift.   COVID PCR sent this shift.  Pt started and IV Normal Saline at 75 ml/hr and tolerating well.    Fall Precautions in place: Bed in lowest position, call light within reach, bed alarm on, fall mat in place.   4 eyes in 4 hours pressure injury assessment note:      Completed with:  Trusty RN   Unit & Time admitted: 0700,  [facility]  [facility] 6/ Stroke Unit             Bony Prominences: Check appropriate box; if wound is present enter wound assessment in LDA     Occiput:                 [x] WNL  []  Wound present  Face:                     [x] WNL  []  Wound present  Ears:                      [x] WNL  []  Wound present  Spine:                    [x] WNL  []  Wound present  Shoulders:             [x] WNL  []  Wound present  Elbows:                  [x] WNL  []  Wound present  Sacrum/coccyx:     [x] WNL  []  Wound present  Ischial Tuberosity:  [x] WNL  []  Wound present  Trochanter/Hip:      [x] WNL  []  Wound present  Knees:                   [x] WNL  []  Wound present  Ankles:                   [x] WNL  []  Wound present  Heels:                    [x] WNL  []  Wound present  Other pressure areas:  []  Wound location       Device related: []  Device name:         LDA completed if wound present: no  Consult WOCN if necessary    Other skin related issues, ie tears, rash, etc, document in Integumentary flowsheet

## 2023-04-09 NOTE — Plan of Care (Signed)
 Problem: Moderate/High Fall Risk Score >5  Goal: Patient will remain free of falls  Outcome: Progressing  Flowsheets (Taken 04/09/2023 0742 by Macarthur Mclean, RN)  Moderate Risk (6-13):   LOW-Fall Interventions Appropriate for Low Fall Risk   LOW-Anticoagulation education for injury risk   MOD-Consider activation of bed alarm if appropriate   MOD-Apply bed exit alarm if patient is confused   MOD-Floor mat at bedside (where available) if appropriate   MOD-Consider a move closer to Nurses Station   MOD-Remain with patient during toileting   MOD-Place bedside commode and assistive devices out of sight when not in use   MOD-Re-orient confused patients   MOD-Utilize diversion activities   MOD-Perform dangle, stand, walk (DSW) prior to mobilization   MOD-Use gait belt when appropriate   MOD-Request PT/OT consult order for patients with gait/mobility impairment   MOD-include family in multidisciplinary POC discussions   MOD- Consider video monitoring     Problem: Pain interferes with ability to perform ADL  Goal: Pain at adequate level as identified by patient  Outcome: Progressing     Problem: Side Effects from Pain Analgesia  Goal: Patient will experience minimal side effects of analgesic therapy  Outcome: Progressing  Flowsheets (Taken 04/09/2023 1804)  Patient will experience minimal side effects of analgesic therapy:   Monitor/assess patient's respiratory status (RR depth, effort, breath sounds)   Prevent/manage side effects per LIP orders (i.e. nausea, vomiting, pruritus, constipation, urinary retention, etc.)   Evaluate for opioid-induced sedation with appropriate assessment tool (i.e. POSS)     Problem: Compromised Sensory Perception  Goal: Sensory Perception Interventions  Outcome: Progressing  Flowsheets (Taken 04/09/2023 0715 by Macarthur Mclean, RN)  Sensory Perception Interventions: Offload heels, Pad bony prominences, Reposition q 2hrs/turn Clock, Q2 hour skin assessment under devices if present     Problem:  Compromised Moisture  Goal: Moisture level Interventions  Outcome: Progressing  Flowsheets (Taken 04/09/2023 0715 by Macarthur Mclean, RN)  Moisture level Interventions: Moisture wicking products, Moisture barrier cream     Problem: Compromised Activity/Mobility  Goal: Activity/Mobility Interventions  Outcome: Progressing  Flowsheets (Taken 04/09/2023 0715 by Macarthur Mclean, RN)  Activity/Mobility Interventions: Pad bony prominences, TAP Seated positioning system when OOB, Promote PMP, Reposition q 2 hrs / turn clock, Offload heels     Problem: Compromised Nutrition  Goal: Nutrition Interventions  Outcome: Progressing  Flowsheets (Taken 04/09/2023 0715 by Macarthur Mclean, RN)  Nutrition Interventions: Discuss nutrition at Rounds, I&Os, Document % meal eaten, Daily weights     Problem: Compromised Friction/Shear  Goal: Friction and Shear Interventions  Outcome: Progressing  Flowsheets (Taken 04/09/2023 0715 by Macarthur Mclean, RN)  Friction and Shear Interventions: Pad bony prominences, Off load heels, HOB 30 degrees or less unless contraindicated, Consider: TAP seated positioning, Heel foams

## 2023-04-09 NOTE — Progress Notes (Signed)
 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 pending GI c/s, EGD w/ biopsy.        Case d/w other Unit CM and RN. Referral placed via [employer]  to various organizations for food/housing/transportation assistance.    Rosaline Calin, MSW, LCSW  Social Work Case Manager  PRN Case Management                                                                                 Provider Notifications:

## 2023-04-09 NOTE — Progress Notes (Signed)
 CASE MANAGEMENT PROGRESS NOTE         DCP:  CM met wit patient per her request. Patient shared that she is getting evicted at the end of this month. She shared that she has been evicted many times from previous addresses as she is unable to pay for rent.    CM provided a list to food pantries and the contact to [employer] and [employer] who can provide financial assistance to clients.     Per patient, she tried calling one agency in the past and the lady that answered the phone was rude with her because she was asked for her tax return and patient shared she does not file taxes because she cannot afford the tax prepares' fee.    CM offered support and informed that regarding housing, clinical research associate can refer her to a homeless shelter.     Valerie May, MSW  Social Worker Case Manager I  [phone]

## 2023-04-09 NOTE — Progress Notes (Signed)
 NURSING ADMISSION NOTE     Date Time: 04/09/23  Patient Name: Valerie May  Attending Physician: Dr. Karyle     Date of Admission:   04/09/23     Reason for Admission:   Abd pain     Admitted from:   [address] [facility] [address]     Nursing Note:   Patient Aox4. English speaking, and VSS. Oriented patient to the floor and POC. Bed to low position. Call bell within reach.      Patient scores as low/mod/high: mod. Patient ambulates: independently.  Bed alarm on and floor mats in place. Patient belongings reviewed. Patient reports pain as 8/10 in stomach . Safety interventions in place. Will continue to monitor.     4 eyes in 4 hours pressure injury assessment note:       Completed with: RN Gila  Unit & Time admitted:                                                                                                      Bony Prominences: Check appropriate box; if wound is present enter wound assessment in LDA              Occiput:                 [x] WNL  []  Wound present  Face:                     [x] WNL  []  Wound present  Ears:                      [x] WNL         []  Wound present  Spine:                    [x] WNL         []  Wound present  Shoulders:             [x] WNL         []  Wound present  Elbows:                  [x] WNL         []  Wound present  Sacrum/coccyx:     [x] WNL         []  Wound present  Ischial Tuberosity:  [x] WNL        []  Wound present  Trochanter/Hip:      [x] WNL       []  Wound present  Knees:                   [x] WNL         []  Wound present  Ankles:                   [x] WNL        []  Wound present  Heels:                    [x] WNL         []  Wound present  Other  pressure areas:      []  Wound location        Device related: []  Device name:           LDA completed if wound present: yes/no  Consult WOCN if necessary     Other skin related issues, ie tears, rash, etc, document in Integumentary flowsheet

## 2023-04-09 NOTE — Plan of Care (Signed)
 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 Kidney function normal.     Maury Cramp, MD  Remote Crosscover

## 2023-04-09 NOTE — Progress Notes (Addendum)
 CM will continue to follow for discharge needs/planning.    53 y.o. female presenting for recurrent acute severe abdominal pain with nausea vomiting.     04/09/23 0810   Patient Type   Within 30 Days of Previous Admission? Yes   Healthcare Decisions   Interviewed: Other (Comment)  (chart review)     Recently hospitalized at [facility] from 2/22-3/11/2023.  Discharged home with family.   Recopied IDPA from recent admission.   ________________________________________________________       03/23/23 1153   Patient Type   Within 30 Days of Previous Admission? No   Healthcare Decisions   Interviewed: Patient   Orientation/Decision Making Abilities of Patient Alert and Oriented x3, able to make decisions   Advance Directive Patient does not have advance directive   Prior to admission   Prior level of function Independent with ADLs;Ambulates independently   Type of Residence Private residence  [phone] [facility] [address] [phone], [address])   Home Layout One level   Have running water , electricity, heat, etc? Yes   Living Arrangements Children  (Lives in a one bedroom apartmet with her son, DIL, 3 grandchild, 2nd son & his SO, and a dtr and her boyfriend.)   How do you get to your MD appointments? family, has applied for Medicaid   How do you get your groceries? family-doordash   Who fixes your meals? everyone pitches in   Who does your laundry? everyone pitches in   Who picks up your prescriptions? family, Patient has not had her medications because she cannot afford   Dressing Independent  (when feeling well)   Grooming Independent   Feeding Independent   Bathing Independent  (when feeling well)   Toileting Independent  (when feeling well)   DME Currently at Home    (none)   Home Care/Community Services None   Prior SNF admission? (Detail) NA   Prior Rehab admission? (Detail) NA   Discharge Planning   Support Systems Children;Family members   Patient expects to be discharged to: home   Anticipated Parmelee plan discussed with: Patient    Mode of transportation: Private car (family member)   Programmer, Applications   How hard is it for you to pay for the very basics like food, housing, medical care, and heating? Very Hard   Housing Stability   In the last 12 months, was there a time when you were not able to pay the mortgage or rent on time? Y   In the past 12 months, how many times have you moved where you were living? 5   At any time in the past 12 months, were you homeless or living in a shelter (including now)? N   Transportation Needs   In the past 12 months, has lack of transportation kept you from medical appointments or from getting medications? yes   In the past 12 months, has lack of transportation kept you from meetings, work, or from getting things needed for daily living? Yes   Consults/Providers   PT Evaluation Needed 1   OT Evalulation Needed 1   SLP Evaluation Needed 2   Correct PCP listed in Epic? Yes  [facility] [place])   Family and PCP   PCP on file was verified as the current PCP? Yes   In case you are admitted, transferred or discharged, would like family notified? Yes   Name of family member to be notified Galeno,Sanros Dorn (Son) [phone] [facility])   In case you are  admitted, transferred or discharged, would like your PCP notified? Yes      Richerd Lehmann MSN, RN, CCM   [facility] - Remote Case Manager

## 2023-04-09 NOTE — Consults (Signed)
 Gastroenterology  CONSULT NOTE  Gastroenterology Consult Service - [facility]  Epic Chat (Group): [facility]  [address]  [facility] [place], [place] [address]  Appointments: [phone]        Date Time: 04/09/23 7:25 AM  Patient Name: [name]  Consulting Provider: Okey Regal, MD      Reason for Consultation / Chief Complaint:   Need for repeat EUS    Assessment and Recommendations:    53 y.o. 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 GI consulted for repeat EUS scheduling.     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 Fine needle biopsy performed --> inadequate tissue from FNB, GI asked to repeat biopsy.   Repeat EUS attempted on 3/7, however, large amount of food residue was found in stomach and duodenum thus unable to perform EUS with FNB. Repeat EUS was planned for Wed 3/12 however patient left AMA.   Acute hypoxic respiratory failure - difficult to extubate post procedure 3/7, remained intubated and transferred to ICU, now extubated 3/8, on 4L NC.   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   N/V, epigastric abd pain - 2/2 above + possible gastroparesis given large food residue seen on EGD and hx DM.   Chronic constipation - possibly dysmotility vs pelvic floor dysfunction. Needs to be started on scheduled daily bowel regimen.     Recommendations:  - Plan for EUS tomorrow, Fri 3/14 in OR.   - Clear liquids only today, NPO midnight.   - AM labs ordered for pre  procedures.   - Start scheduled daily bowel regimen with miralax daily and doc-senna BID.     Preparation for endoscopy (criteria for safe sedation):  - K > 3.2 and < 5.6  - Na > 126 and < 145  - HgB > 7, and > 8 for cardiac history  - Oxygen saturation of 93% on 6 liters of oxygen or less  - PT INR < 1.5- 1.7 and platelets > 50    Patient discussed and reviewed with Dr. Jeanella Flattery.     I spent a total of 63 minutes with the patient today, which includes a review of records, discussion with nurses or other providers, face-to-face with the patient, counseling and coordinating care, any family discussion, and documentation of this care. This time is minus any separately billable procedures that may have been performed.     History:   Valerie May is a 53 y.o. 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 GI consulted for repeat EUS scheduling. Patient reports she went home and ate very light meals. She reports she had ongoing abd pain, N/V that never resolved. It worsened after eating and  she came back in. Hgb 14.4, WBC 9. BMP WNL ,lactic 1.7, alk phos 138, remainder of LFTs WNL, normal bili, normal lipase.     Prior GI procedures:  S/p EUS 03/25/23: A mass was identified in the pancreatic head. Fine needle biopsy performed --> inadequate tissue from FNB, GI asked to repeat biopsy.     Past Medical History:   Medical History[1]      Past Surgical History:   Past Surgical History[2]    Family History:   No pertinent family history     Social History:     Social History     Socioeconomic History    Marital status: Single     Spouse name: Not on file    Number of children: Not on file    Years of  education: Not on file    Highest education level: Not on file   Occupational History    Not on file   Tobacco Use    Smoking status: Never     Passive exposure: Current    Smokeless tobacco: Never   Vaping Use    Vaping status: Never Used   Substance and Sexual Activity    Alcohol use: Not Currently    Drug use: Never    Sexual activity: Not Currently   Other Topics Concern    Not on file   Social History Narrative    Not on file     Social Drivers of Health     Financial Resource Strain: High Risk (03/23/2023)    Overall Financial Resource Strain (CARDIA)     Difficulty of Paying Living Expenses: Very hard   Food Insecurity: No Food Insecurity (03/21/2023)    Hunger Vital Sign     Worried About Running Out of Food in the Last Year: Never true     Ran Out of Food in the Last Year: Never true   Transportation Needs: Unmet Transportation Needs (03/23/2023)    PRAPARE - Therapist, art (Medical): Yes     Lack of Transportation (Non-Medical): Yes   Physical Activity: Not on file   Stress: Not on file   Social Connections: Not on file   Intimate Partner Violence: Not At Risk (03/21/2023)    Humiliation, Afraid, Rape, and Kick questionnaire     Fear of Current or Ex-Partner: No     Emotionally Abused: No     Physically Abused: No     Sexually Abused: No   Housing Stability: High Risk (03/23/2023)    Housing Stability Vital Sign     Unable to Pay for Housing in the Last Year: Yes     Number of Times Moved in the Last Year: 5     Homeless in the Last Year: No       Allergies:   Allergies[3]    Medications:     Current Facility-Administered Medications   Medication Dose Route Frequency         Review of Systems:   ROS:  General/Constitutional:   Denies fevers and chills.   Respiratory:   Denies shortness of breath.   Cardiovascular:   Denies chest pain and swelling in hands/feet.   Gastrointestinal:   Denies abdominal distention, constipation, diarrhea, anorexia, blood in stool, black stools.  +Abd  pain, N/V.   All other systems reviewed and are negative      Physical Exam:   Physical Exam:  General Examination:   GENERAL APPEARANCE: Alert, no acute distress, well  developed, well nourished.  HENT: No scleral icterus.   HEART: Clinically well perfused.  LUNGS: No respiratory distress.   ABDOMEN: Soft, non distended, non tender.     Lab:     Results       Procedure Component Value Units Date/Time    Serum Beta HCG Qualitative [[id number]] Collected: 04/08/23 2327    Specimen: Blood, Venous Updated: 04/09/23 0718    Culture, Urine [[id number]] Collected: 04/09/23 0058    Specimen: Urine, Clean Catch Updated: 04/09/23 0600    Urinalysis with Reflex to Microscopic Exam and Culture [[id number]]  (Abnormal) Collected: 04/09/23 0058    Specimen: Urine, Clean Catch Updated: 04/09/23 0122     Urine Color Yellow     Urine Clarity Hazy     Urine Specific Gravity 1.026     Urine pH 8.0     Urine Leukocyte Esterase Small     Urine Nitrite Negative     Urine Protein 100= 2+     Urine Glucose Negative     Urine Ketones Negative mg/dL      Urine Urobilinogen 2.0 mg/dL      Urine Bilirubin Negative     Urine Blood Negative     RBC, UA 0-2 /hpf      Urine WBC 11-25 /hpf      Urine Squamous Epithelial Cells 0-5 /hpf      Urine Calcium Oxalate Crystals Occasional /hpf      Urine Mucus Present    Urine [employer] Tube [[id number]] Collected: 04/09/23 0058    Specimen: Urine, Clean Catch Updated: 04/09/23 0107    Basic Metabolic Panel [[id number]]  (Abnormal) Collected: 04/08/23 2327    Specimen: Blood, Venous Updated: 04/09/23 0003     Glucose 118 mg/dL      BUN 15 mg/dL      Creatinine 0.6 mg/dL      Calcium 02.7 mg/dL      Sodium 253 mEq/L      Potassium 4.0 mEq/L      Chloride 107 mEq/L      CO2 25 mEq/L      Anion Gap 9.0     GFR >60.0 mL/min/1.73 m2     Ethanol (Alcohol) Level [[id number]] Collected: 04/08/23 2327    Specimen: Blood, Venous Updated: 04/09/23 0003     Alcohol Not Detected    Hepatic Function Panel (LFT)  [[id number]]  (Abnormal) Collected: 04/08/23 2327    Specimen: Blood, Venous Updated: 04/09/23 0003     Bilirubin, Total 0.5 mg/dL      Bilirubin Direct 0.2 mg/dL      Bilirubin Indirect 0.3 mg/dL      AST (SGOT) 30 U/L      ALT 54 U/L      Alkaline Phosphatase 138 U/L      Albumin 4.2 g/dL      Globulin 2.8 g/dL      Albumin/Globulin Ratio 1.5     Protein, Total 7.0 g/dL     Lipase [[id number]]  (Normal) Collected: 04/08/23 2327    Specimen: Blood, Venous Updated: 04/09/23 0003     Lipase 37 U/L     CBC with Differential (Order) [[id number]]  (Abnormal) Collected: 04/08/23 2327    Specimen: Blood, Venous Updated: 04/08/23 2348    Narrative:      The following orders were created for panel order CBC with Differential (Order).  Procedure  Abnormality         Status                     ---------                               -----------         ------                     CBC with Differential (...[[id number]]  Abnormal            Final result                 Please view results for these tests on the individual orders.    CBC with Differential (Component) [[id number]]  (Abnormal) Collected: 04/08/23 2327    Specimen: Blood, Venous Updated: 04/08/23 2348     WBC 8.89 x10 3/uL      Hemoglobin 14.4 g/dL      Hematocrit 95.2 %      Platelet Count 398 x10 3/uL      MPV 9.6 fL      RBC 4.81 x10 6/uL      MCV 87.5 fL      MCH 29.9 pg      MCHC 34.2 g/dL      RDW 12 %      nRBC % 0.0 /100 WBC      Absolute nRBC 0.00 x10 3/uL      Preliminary Absolute Neutrophil Count 4.36 x10 3/uL      Neutrophils % 49.1 %      Lymphocytes % 36.1 %      Monocytes % 10.9 %      Eosinophils % 2.6 %      Basophils % 1.0 %      Immature Granulocytes % 0.3 %      Absolute Neutrophils 4.36 x10 3/uL      Absolute Lymphocytes 3.21 x10 3/uL      Absolute Monocytes 0.97 x10 3/uL      Absolute Eosinophils 0.23 x10 3/uL      Absolute Basophils 0.09 x10 3/uL      Absolute Immature Granulocytes 0.03 x10 3/uL     Culture, Blood,  Aerobic And Anaerobic [[id number]] Collected: 04/08/23 2327    Specimen: Blood, Venous Updated: 04/08/23 2332    Lactic Acid [[id number]]  (Normal) Collected: 04/08/23 2252    Specimen: Blood, Venous Updated: 04/08/23 2307     Whole Blood Lactic Acid 1.7 mmol/L           Labs Reviewed.     Radiology:     Radiology Results (24 Hour)       Procedure Component Value Units Date/Time    CT Angio Chest (PE or trauma protocol) [[id number]] Collected: 04/09/23 0325    Order Status: Completed Updated: 04/09/23 0336    Narrative:      HISTORY: Chest pain and tachycardia. Recent ICU stay and intubation.    COMPARISON: 03/21/2023    TECHNIQUE: CTA chest WITH intravenous contrast. PE protocol. 3D MIP images  are submitted and reviewed. The following dose reduction techniques were  utilized: Automated exposure control and/or adjustment of the mA and/or kV  according to patient size, and the use of iterative reconstruction  technique.    CONTRAST: iohexol (OMNIPAQUE) 350 MG/ML injection 100 mL.     FINDINGS:     LINES/TUBES: None.  LUNGS: Stable scarring with traction bronchiectasis in the bilateral upper  lobes and right middle lobe. No consolidation, edema or mass.  PLEURA: No pleural effusions or pneumothorax.    HEART: Not enlarged.  MEDIASTINUM: No axillary, hilar or mediastinal lymphadenopathy.  PULMONARY ARTERIES: No acute pulmonary emboli.  AORTA: No aneurysm or dissection.    UPPER ABDOMEN: Please see separate CT of the abdomen and pelvis report.    BONES AND SOFT TISSUES: No suspicious or destructive osseous lesion.      Impression:        No acute pulmonary embolism.    Demetrios Isaacs, MD  04/09/2023 3:34 AM    CT Abd/ Pelvis with IV Contrast [[id number]] Collected: 04/09/23 0309    Order Status: Completed Updated: 04/09/23 0326    Narrative:      HISTORY: Recent biopsy of a pancreatic head mass. Severe diffuse abdominal  pain.     COMPARISON: MRI abdomen 03/24/2023; CT abdomen pelvis 03/21/2023    TECHNIQUE: CT abdomen  and pelvis WITH intravenous contrast. The following  dose reduction techniques were utilized: Automated exposure control and/or  adjustment of the mA and/or kV according to patient size, and the use of  iterative reconstruction technique.    CONTRAST: iohexol (OMNIPAQUE) 350 MG/ML injection 100 mL.     FINDINGS:     LOWER THORAX: The lung bases are clear.    LIVER/BILIARY TREE: Postcholecystectomy. No focal liver lesion.  SPLEEN: No splenomegaly.  PANCREAS: Slightly hyperattenuating lesion in the pancreatic head measures  1.6 x 1.2 cm. This previously measured up to 1.2 cm in dimension on prior  MRI. There is minimal adjacent fat stranding/fluid. No ductal dilation.    KIDNEYS/URETERS: The kidneys enhance symmetrically. There are small  nonobstructing bilateral renal calculi. Small right renal cyst previously  characterized by MRI. No hydronephrosis.  ADRENALS: No adrenal mass.  PELVIC ORGANS/BLADDER: The bladder is nondistended.    PERITONEUM/RETROPERITONEUM: No free air or fluid.  LYMPH NODES: No lymphadenopathy.  VESSELS: No aortic aneurysm.    GI TRACT: No bowel wall thickening or obstruction.  There is diverticulosis  without evidence of acute diverticulitis. The appendix looks normal.    BONES AND SOFT TISSUES: No suspicious or destructive osseous lesion.      Impression:        1.Minimal fat stranding/fluid adjacent to the pancreatic head may represent  postbiopsy changes. Recommend correlation with lipase to exclude acute  pancreatitis.  2.Slightly increased size of the pancreatic head lesion, potentially  reflecting intralesional hemorrhage post biopsy.    Demetrios Isaacs, MD  04/09/2023 3:24 AM    XR Chest  AP Portable [[id number]] Collected: 04/09/23 0012    Order Status: Completed Updated: 04/09/23 0015    Narrative:      HISTORY:   Postoperative pain.     COMPARISON: 04/04/2023.    FINDINGS:     LUNGS: No consolidation or edema.    PLEURA: No pleural effusions or pneumothorax.    HEART AND MEDIASTINUM:  No  cardiac enlargement.    BONES:  Suboptimally evaluated. Degenerative changes of the visualized  spine.      Impression:        No acute pulmonary process.    Judd Gaudier, MD  04/09/2023 12:13 AM          Radiological Procedure within last 24 hours reviewed.    Signed by: Carie Caddy, PA                       [  1]   Past Medical History:  Diagnosis Date    COVID-19     COVID-19 2021    Heart valve problem     Kidney stone     Pneumonia    [2]   Past Surgical History:  Procedure Laterality Date    CYST REMOVAL      ESOPHAGOGASTRODUODENOSCOPY (EGD), ESOPHAGEAL ULTRASOUND (EUS) WITH FINE NEEDLE ASPIRATION/BIOPSY OF ESOPHAGUS, STOMACH, OR DUODENUM N/A 03/25/2023    Procedure: ESOPHAGOGASTRODUODENOSCOPY (EGD), ESOPHAGEAL ULTRASOUND (EUS) WITH FINE NEEDLE ASPIRATION/BIOPSY OF ESOPHAGUS, STOMACH, OR DUODENUM;  Surgeon: Launa Flight, MD;  Location: [facility];  Service: Gastroenterology;  Laterality: N/A;    ESOPHAGOGASTRODUODENOSCOPY (EGD), ESOPHAGEAL ULTRASOUND (EUS) WITH FINE NEEDLE ASPIRATION/BIOPSY OF ESOPHAGUS, STOMACH, OR DUODENUM N/A 04/03/2023    Procedure: ESOPHAGOGASTRODUODENOSCOPY (EGD), ESOPHAGEAL ULTRASOUND (EUS) WITH FINE NEEDLE ASPIRATION/BIOPSY OF ESOPHAGUS, STOMACH, OR DUODENUM;  Surgeon: Franki Monte, MD;  Location: [id number];  Service: Gastroenterology;  Laterality: N/A;    GALLBLADDER SURGERY      HERNIA REPAIR      HYSTERECTOMY      TUBAL LIGATION     [3]   Allergies  Allergen Reactions    Benadryl [Diphenhydramine]     Dust Mite Extract     Methocarbamol Itching    Molds & Smuts     Other     Compazine [Prochlorperazine] Anxiety

## 2023-04-09 NOTE — Telephone Encounter (Signed)
 Appt for pancreatic

## 2023-04-09 NOTE — ED to IP RN Note (Signed)
 [facility] EMERGENCY DEPT  ED NURSING NOTE FOR THE RECEIVING INPATIENT NURSE   ED NURSE Jaicion Laurie   [address]INK [id number]   ED CHARGE RN Brittni   ADMISSION INFORMATION   Valerie May is a 53 y.o. female admitted with an ED diagnosis of:    1. Intractable abdominal pain    2. Nausea and vomiting, unspecified vomiting type         Isolation: None   Allergies: Benadryl  [diphenhydramine ], Dust mite extract, Methocarbamol, Molds & smuts, Other, and Compazine [prochlorperazine]   Holding Orders confirmed? No   Belongings Documented? No   Home medications sent to pharmacy confirmed? N/A   NURSING CARE   Patient Comes From:   Mental Status: Home Independent  alert and oriented   ADL: Independent with all ADLs   Ambulation: no difficulty   Pertinent Information  and Safety Concerns:     [place] Violence Risk Level: Low BIBA for c/o diffuse abd pain that radiates to midsternal area, N/V/D since discharge yesterday. Patiently recently admitted for biopsy of pancreatic mass. +subjective fever yesterday     CT / NIH   CT Head ordered on this patient?  No   NIH/Dysphagia assessment done prior to admission? N/A   VITAL SIGNS (at the time of this note)      Vitals:    04/09/23 0400   BP: 106/68   Pulse: (!) 104   Resp: (!) 23   Temp: 98.1 F (36.7 C)   SpO2: 96%     Pain Score: 5-moderate pain (04/09/23 0400)

## 2023-04-09 NOTE — ED Notes (Signed)
 Bed: S 21  Expected date:   Expected time:   Means of arrival:   Comments:  24 moving here

## 2023-04-09 NOTE — H&P (Addendum)
 ADMISSION HISTORY AND PHYSICAL EXAM    Date Time: 04/09/23 11:06 AM  Patient Name: [facility] D  Attending Physician: Karyle Salvo, MD  Primary Care Physician: Michiel Olam RAMAN, FNP    CC: Abdominal pain, nausea, vomiting, diarrhea      Assessment / Plan:      53 year old female with past medical history of asthma, eosinophilia mild bronchiectasis, recurrent pneumonia, GERD, diabetes 2, polycythemia, obesity, prior acute hypercarbic respiratory failure presented with nausea, vomiting and abdominal pain.She was recently admitted to the hospital on 2/22 with nausea vomiting, abdominal pain and altered mental status.  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    #Intractable nausea, vomiting  #Diarrhea  #Abdominal pain  #Heartburn  #Pancreatic head lesion  Lipase, lactic acid within normal limit  CT abdomen pelvis showed minimal fluid adjacent to pancreatic head. ? intralesional hemorrhage postbiopsy  GI consulted  Stool studies ordered  IV Zofran , QTc 447  IV fluid started  Respiratory panel ordered  Check ESR, CRP  Continue supportive care  Hold off on IV antibiotic as no clear source of infection        #Diabetes 2  Last A1c 6  Monitor blood glucose  Insulin  sliding scale    #GERD  PPI    #Asthma  #Obesity  Continue Symbicort , Singulair , albuterol  as needed  Given recent acute hypercarbic respiratory failure plan to avoid sedating meds  Will need sleep study     #Thrombocytosis  Likely reactive  Will monitor      DVT prophylaxis: Currently on hold pending GI evaluation    CODE STATUS full code      Additional Diagnoses:     Patient has a BMI of 34.34 kg/m2    Diagnosis: Class 1 Obesity: BMI of 30 to 34.9               Disposition: (Please see PAF column for  Expected D/C Date)   Today's date: 04/09/2023  Admit Date: 04/08/2023 10:41 PM  Service status: Inpatient: risk of morbidity and mortality  Clinical Milestones: TBD  Anticipated discharge needs: TBD    History of Presenting Illness:   53 year old female with past medical history of asthma, eosinophilia, bronchiectasis, recurrent pneumonia, GERD, diabetes 2, polycythemia, obesity presented with nausea, vomiting and abdominal pain.  She was recently admitted to the hospital on 2/22 with nausea vomiting, abdominal pain and altered mental status.  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  Pathology report was indeterminate but suspicious for neuroendocrine neoplasia and oncology recommended to repeat biopsy but she left the hospital against medical advice on March 11.  Patient was complaining of diarrhea since left from the hospital associated with generalized abdominal pain, nausea and vomiting.  She also complained of subjective fever and chills.  Denied any recent sick contact.  Admitted to the hospitalist service for further management.  Past Medical History:     Past Medical History:   Diagnosis Date    COVID-19     COVID-19 2021    Heart valve problem     Kidney stone     Pneumonia        Available old records reviewed, including: Chart    Past  Surgical History:   Past Surgical History[1]    Family History:   Family History[2]     Social History:   Tobacco Use History[3]  Social History     Substance and Sexual Activity   Alcohol  Use Not Currently     Social History     Substance and Sexual Activity   Drug Use Never       Allergies:   Allergies[4]    Medications:     Home Medications       Med List Status: In Progress Set By: Hernando, Juancho Luis, RN at 04/08/2023 10:53 PM              acetaminophen  (TYLENOL ) 650 MG CR tablet     TAKE 1 TABLET BY MOUTH EVERY 8 HOURS AS NEEDED FOR PAIN     Patient taking differently: every 8  (eight) hours as needed     albuterol  sulfate HFA (PROVENTIL ) 108 (90 Base) MCG/ACT inhaler     Inhale 1-2 puffs into the lungs every 4 (four) hours as needed     benzonatate  (TESSALON ) 200 MG capsule     Take 1 capsule (200 mg) by mouth 3 (three) times daily as needed for Cough     Budeson-Glycopyrrol-Formoterol  160-9-4.8 MCG/ACT Aerosol     Inhale 2 puffs into the lungs 2 (two) times daily     budesonide -formoterol  (SYMBICORT ) 160-4.5 MCG/ACT inhaler     Inhale 2 puffs into the lungs 2 (two) times daily as needed     budesonide -formoterol  (SYMBICORT ) 160-4.5 MCG/ACT inhaler     Inhale 2 puffs into the lungs 2 (two) times daily     cetirizine  (ZyrTEC ) 10 MG tablet     Take 10 mg by mouth daily     diphenhydrAMINE -APAP, sleep, (Excedrin PM) 38-500 MG Tab     Take by mouth daily     metFORMIN (GLUCOPHAGE) 500 MG tablet     Take 1 tablet (500 mg) by mouth 2 (two) times daily with meals     montelukast  (SINGULAIR ) 10 MG tablet     Take 1 tablet (10 mg) by mouth nightly     pantoprazole  (PROTONIX ) 40 MG tablet     TAKE 1 TABLET BY MOUTH EVERY DAY     Patient taking differently: as needed     umeclidinium bromide  (INCRUSE ELLIPTA ) 62.5 MCG/ACT Aerosol Pwdr, Breath Activated     Inhale 1 puff into the lungs daily     venlafaxine  (EFFEXOR ) 25 MG tablet     Take 1 tablet (25 mg) by mouth 3 (three) times daily with meals for 15 days     vitamin D , ergocalciferol , (DRISDOL ) 50000 UNIT Cap     Take 1 capsule (50,000 Units total) by mouth once a week              Method by which medications were confirmed on admission: Chart    Review of Systems:   All other systems were reviewed and are negative except: HPI    Physical Exam:   Patient Vitals for the past 24 hrs:   BP Temp Temp src Pulse Resp SpO2 Height Weight   04/09/23 0743 -- -- -- -- -- -- -- 77.1 kg (170 lb)   04/09/23 0729 108/71 97.9 F (36.6 C) -- 92 22 -- 1.499 m (4' 11) 77.1 kg (170 lb)   04/09/23 0656 108/71 97.9 F (36.6 C) Oral 92 -- 92 % -- --   04/09/23 0600  105/62 -- -- 98  22 98 % -- --   04/09/23 0530 107/62 -- -- 100 20 98 % -- --   04/09/23 0504 -- -- -- 100 21 97 % -- --   04/09/23 0500 108/58 -- -- 100 (!) 23 (!) 86 % -- --   04/09/23 0400 106/68 98.1 F (36.7 C) Oral (!) 104 (!) 23 96 % -- --   04/09/23 0306 122/67 -- -- (!) 102 18 96 % -- --   04/09/23 0101 140/61 -- -- (!) 114 19 95 % -- --   04/09/23 0000 125/82 -- -- (!) 105 21 95 % -- --   04/08/23 2246 151/89 -- -- (!) 105 -- 95 % -- --   04/08/23 2201 125/81 98 F (36.7 C) Oral (!) 102 20 95 % 1.499 m (4' 11) 77.1 kg (170 lb)     Body mass index is 34.34 kg/m.  No intake or output data in the 24 hours ending 04/09/23 1106    General: awake, alert, oriented x 3; no acute distress.  HEENT: perrla, eomi, sclera anicteric  oropharynx clear without lesions, mucous membranes moist  Neck: supple, no lymphadenopathy, no thyromegaly, no JVD, no carotid bruits  Cardiovascular: regular rate and rhythm, no murmurs, rubs or gallops  Lungs: clear to auscultation bilaterally, without wheezing, rhonchi, or rales  Abdomen: soft, diffusely tender, normoactive bowel sounds, no rebound or guarding  Extremities: no clubbing, cyanosis, or edema  Neuro: cranial nerves grossly intact, strength 5/5 in upper and lower extremities, sensation intact  Skin: no rashes or lesions noted         Labs:     Results       Procedure Component Value Units Date/Time    Serum Beta HCG Qualitative [[id number]]  (Normal) Collected: 04/08/23 2327    Specimen: Blood, Venous Updated: 04/09/23 1023     hCG Qualitative Negative    Urine Elnor Culture Hold Tube [[id number]] Collected: 04/09/23 0058    Specimen: Urine, Clean Catch Updated: 04/09/23 1004     Extra Tube Hold for add-ons.    Culture, Urine [[id number]] Collected: 04/09/23 0058    Specimen: Urine, Clean Catch Updated: 04/09/23 0600    Urinalysis with Reflex to Microscopic Exam and Culture [[id number]]  (Abnormal) Collected: 04/09/23 0058    Specimen: Urine, Clean Catch Updated: 04/09/23  0122     Urine Color Yellow     Urine Clarity Hazy     Urine Specific Gravity 1.026     Urine pH 8.0     Urine Leukocyte Esterase Small     Urine Nitrite Negative     Urine Protein 100= 2+     Urine Glucose Negative     Urine Ketones Negative mg/dL      Urine Urobilinogen 2.0 mg/dL      Urine Bilirubin Negative     Urine Blood Negative     RBC, UA 0-2 /hpf      Urine WBC 11-25 /hpf      Urine Squamous Epithelial Cells 0-5 /hpf      Urine Calcium  Oxalate Crystals Occasional /hpf      Urine Mucus Present    Basic Metabolic Panel [[id number]]  (Abnormal) Collected: 04/08/23 2327    Specimen: Blood, Venous Updated: 04/09/23 0003     Glucose 118 mg/dL      BUN 15 mg/dL      Creatinine 0.6 mg/dL      Calcium  10.2 mg/dL      Sodium 858 mEq/L  Potassium 4.0 mEq/L      Chloride 107 mEq/L      CO2 25 mEq/L      Anion Gap 9.0     GFR >60.0 mL/min/1.73 m2     Ethanol (Alcohol ) Level [[id number]] Collected: 04/08/23 2327    Specimen: Blood, Venous Updated: 04/09/23 0003     Alcohol  Not Detected    Hepatic Function Panel (LFT) [[id number]]  (Abnormal) Collected: 04/08/23 2327    Specimen: Blood, Venous Updated: 04/09/23 0003     Bilirubin, Total 0.5 mg/dL      Bilirubin Direct 0.2 mg/dL      Bilirubin Indirect 0.3 mg/dL      AST (SGOT) 30 U/L      ALT 54 U/L      Alkaline Phosphatase 138 U/L      Albumin 4.2 g/dL      Globulin 2.8 g/dL      Albumin/Globulin Ratio 1.5     Protein, Total 7.0 g/dL     Lipase [[id number]]  (Normal) Collected: 04/08/23 2327    Specimen: Blood, Venous Updated: 04/09/23 0003     Lipase 37 U/L     CBC with Differential (Order) [[id number]]  (Abnormal) Collected: 04/08/23 2327    Specimen: Blood, Venous Updated: 04/08/23 2348    Narrative:      The following orders were created for panel order CBC with Differential (Order).  Procedure                               Abnormality         Status                     ---------                               -----------         ------                     CBC  with Differential (...[[id number]]  Abnormal            Final result                 Please view results for these tests on the individual orders.    CBC with Differential (Component) [[id number]]  (Abnormal) Collected: 04/08/23 2327    Specimen: Blood, Venous Updated: 04/08/23 2348     WBC 8.89 x10 3/uL      Hemoglobin 14.4 g/dL      Hematocrit 57.8 %      Platelet Count 398 x10 3/uL      MPV 9.6 fL      RBC 4.81 x10 6/uL      MCV 87.5 fL      MCH 29.9 pg      MCHC 34.2 g/dL      RDW 12 %      nRBC % 0.0 /100 WBC      Absolute nRBC 0.00 x10 3/uL      Preliminary Absolute Neutrophil Count 4.36 x10 3/uL      Neutrophils % 49.1 %      Lymphocytes % 36.1 %      Monocytes % 10.9 %      Eosinophils % 2.6 %      Basophils % 1.0 %      Immature Granulocytes % 0.3 %  Absolute Neutrophils 4.36 x10 3/uL      Absolute Lymphocytes 3.21 x10 3/uL      Absolute Monocytes 0.97 x10 3/uL      Absolute Eosinophils 0.23 x10 3/uL      Absolute Basophils 0.09 x10 3/uL      Absolute Immature Granulocytes 0.03 x10 3/uL     Culture, Blood, Aerobic And Anaerobic [[id number]] Collected: 04/08/23 2327    Specimen: Blood, Venous Updated: 04/08/23 2332    Lactic Acid [[id number]]  (Normal) Collected: 04/08/23 2252    Specimen: Blood, Venous Updated: 04/08/23 2307     Whole Blood Lactic Acid 1.7 mmol/L             Imaging and labs reviewed    Safety Checklist  DVT prophylaxis:  CHEST guideline (See page [id number]) No chemical DVT prophylaxis   Foley:  [place] R[facility] Foley protocol Not present   IVs:  Peripheral IV   PT/OT: Not needed   Daily CBC & or Chem ordered:  SHM/ABIM guidelines (see #5) Yes, due to clinical and lab instability       Signed by: Wayna Majors, MD  cc:Hsu, Olam RAMAN, FNP          [1]   Past Surgical History:  Procedure Laterality Date    CYST REMOVAL      ESOPHAGOGASTRODUODENOSCOPY (EGD), ESOPHAGEAL ULTRASOUND (EUS) WITH FINE NEEDLE ASPIRATION/BIOPSY OF ESOPHAGUS, STOMACH, OR DUODENUM N/A 03/25/2023    Procedure:  ESOPHAGOGASTRODUODENOSCOPY (EGD), ESOPHAGEAL ULTRASOUND (EUS) WITH FINE NEEDLE ASPIRATION/BIOPSY OF ESOPHAGUS, STOMACH, OR DUODENUM;  Surgeon: Abagail Carole HERO, MD;  Location: [email or web address];  Service: Gastroenterology;  Laterality: N/A;    ESOPHAGOGASTRODUODENOSCOPY (EGD), ESOPHAGEAL ULTRASOUND (EUS) WITH FINE NEEDLE ASPIRATION/BIOPSY OF ESOPHAGUS, STOMACH, OR DUODENUM N/A 04/03/2023    Procedure: ESOPHAGOGASTRODUODENOSCOPY (EGD), ESOPHAGEAL ULTRASOUND (EUS) WITH FINE NEEDLE ASPIRATION/BIOPSY OF ESOPHAGUS, STOMACH, OR DUODENUM;  Surgeon: Ulysess Foots, MD;  Location: [id number];  Service: Gastroenterology;  Laterality: N/A;    GALLBLADDER SURGERY      HERNIA REPAIR      HYSTERECTOMY      TUBAL LIGATION     [2]   Family History  Problem Relation Name Age of Onset    Heart attack Mother      Diabetes Father      Heart attack Maternal Grandfather      Heart attack Paternal Grandfather      Stroke Son      Heart attack Maternal Uncle      Stomach cancer Cousin      Bone cancer Maternal Aunt      Colon cancer Neg Hx      Breast cancer Neg Hx     [3]   Social History  Tobacco Use   Smoking Status Never    Passive exposure: Current   Smokeless Tobacco Never   [4]   Allergies  Allergen Reactions    Benadryl  [Diphenhydramine ]     Dust Mite Extract     Methocarbamol Itching    Molds & Smuts     Other     Compazine [Prochlorperazine] Anxiety

## 2023-04-10 ENCOUNTER — Inpatient Hospital Stay

## 2023-04-10 ENCOUNTER — Encounter (HOSPITAL_BASED_OUTPATIENT_CLINIC_OR_DEPARTMENT_OTHER): Admission: EM | Disposition: A | Discharge status: Home / Self Care | Payer: Self-pay | Source: Home / Self Care

## 2023-04-10 HISTORY — PX: ESOPHAGOGASTRODUODENOSCOPY (EGD), ESOPHAGEAL ULTRASOUND (EUS) LIMITED TO THE ESOPHAGUS, STOMACH OR DUODENUM: SHX3809

## 2023-04-10 LAB — MAGNESIUM: Magnesium: 1.8 mg/dL (ref 1.6–2.6)

## 2023-04-10 LAB — CBC
Absolute nRBC: 0 10*3/uL (ref <=0.00)
Hematocrit: 41.7 % (ref 34.7–43.7)
Hemoglobin: 13.6 g/dL (ref 11.4–14.8)
MCH: 29.5 pg (ref 25.1–33.5)
MCHC: 32.6 g/dL (ref 31.5–35.8)
MCV: 90.5 fL (ref 78.0–96.0)
MPV: 9.9 fL (ref 8.9–12.5)
Platelet Count: 360 10*3/uL — ABNORMAL HIGH (ref 142–346)
RBC: 4.61 10*6/uL (ref 3.90–5.10)
RDW: 12 % (ref 11–15)
WBC: 6.9 10*3/uL (ref 3.10–9.50)
nRBC %: 0 /100{WBCs} (ref <=0.0)

## 2023-04-10 LAB — RESTRICTED TEST**RESPIRATORY PATHOGEN PANEL WITH COVID-19, PCR
Adenovirus DNA: NOT DETECTED
Bordetella parapertussis DNA: NOT DETECTED
Bordetella pertussis DNA: NOT DETECTED
Chlamydophila pneumoniae DNA: NOT DETECTED
Coronavirus 229E RNA: NOT DETECTED
Coronavirus HKU1 RNA: NOT DETECTED
Coronavirus NL63 RNA: NOT DETECTED
Coronavirus OC43 RNA: NOT DETECTED
Human Metapneumovirus RNA: NOT DETECTED
Human Rhinovirus/Enterovirus RNA: NOT DETECTED
Influenza A RNA: NOT DETECTED
Influenza B RNA: NOT DETECTED
Mycoplasma pneumoniae DNA: NOT DETECTED
Parainfluenza Virus 1 RNA: NOT DETECTED
Parainfluenza Virus 2 RNA: NOT DETECTED
Parainfluenza Virus 3 RNA: NOT DETECTED
Parainfluenza Virus 4 RNA: NOT DETECTED
Respiratory Syncytial Virus RNA: NOT DETECTED
SARS-CoV-2 (COVID-19) RNA: NOT DETECTED

## 2023-04-10 LAB — COMPREHENSIVE METABOLIC PANEL
ALT: 44 U/L (ref <=55)
AST (SGOT): 33 U/L (ref <=41)
Albumin/Globulin Ratio: 1.2 (ref 0.9–2.2)
Albumin: 3.7 g/dL (ref 3.5–5.0)
Alkaline Phosphatase: 113 U/L (ref 37–117)
Anion Gap: 10 (ref 5.0–15.0)
BUN: 10 mg/dL (ref 7–21)
Bilirubin, Total: 0.6 mg/dL (ref 0.2–1.2)
CO2: 21 meq/L (ref 17–29)
Calcium: 9 mg/dL (ref 8.5–10.5)
Chloride: 110 meq/L (ref 99–111)
Creatinine: 0.7 mg/dL (ref 0.4–1.0)
GFR: >60 mL/min/{1.73_m2} (ref >=60.0)
Globulin: 3 g/dL (ref 2.0–3.6)
Glucose: 108 mg/dL — ABNORMAL HIGH (ref 70–100)
Potassium: 3.6 meq/L (ref 3.5–5.3)
Protein, Total: 6.7 g/dL (ref 6.0–8.3)
Sodium: 141 meq/L (ref 135–145)

## 2023-04-10 LAB — STOOL CRYPTOSPORIDIUM PARVUM AND GIARDIA LAMBLIA, ANTIGEN
Stool Cryptosporidium parvum antigen: NEGATIVE
Stool Giardia lamblia antigen: NEGATIVE

## 2023-04-10 LAB — PT AND APTT
INR: 1.2 (ref 0.9–1.1)
PT: 13.2 s — ABNORMAL HIGH (ref 10.1–12.9)
PTT: 33 s (ref 27–39)

## 2023-04-10 LAB — WHOLE BLOOD GLUCOSE POCT
Whole Blood Glucose POCT: 122 mg/dL — ABNORMAL HIGH (ref 70–100)
Whole Blood Glucose POCT: 98 mg/dL (ref 70–100)

## 2023-04-10 LAB — PHOSPHORUS: Phosphorus: 4.3 mg/dL (ref 2.3–4.7)

## 2023-04-10 SURGERY — ESOPHAGOGASTRODUODENOSCOPY (EGD), ESOPHAGEAL ULTRASOUND (EUS) LIMITED TO THE ESOPHAGUS, STOMACH OR DUODENUM
Anesthesia: Anesthesia General

## 2023-04-10 MED ORDER — SUGAMMADEX SODIUM 200 MG/2ML IV SOLN
INTRAVENOUS | Status: AC
Start: 2023-04-10 — End: 2023-04-10
  Filled 2023-04-10: qty 4

## 2023-04-10 MED ORDER — PROPOFOL 10 MG/ML IV EMUL (WRAP)
INTRAVENOUS | Status: DC | PRN
Start: 2023-04-10 — End: 2023-04-10
  Administered 2023-04-10: 200 mg via INTRAVENOUS

## 2023-04-10 MED ORDER — ONDANSETRON HCL 4 MG/2ML IJ SOLN
4.0000 mg | Freq: Three times a day (TID) | INTRAMUSCULAR | Status: AC | PRN
Start: 2023-04-10 — End: ?
  Administered 2023-04-10 – 2023-04-14 (×6): 4 mg via INTRAVENOUS
  Filled 2023-04-10 (×7): qty 2

## 2023-04-10 MED ORDER — DEXAMETHASONE SODIUM PHOSPHATE 20 MG/5ML IJ SOLN
INTRAMUSCULAR | Status: AC
Start: 2023-04-10 — End: ?
  Filled 2023-04-10: qty 5

## 2023-04-10 MED ORDER — FENTANYL CITRATE (PF) 50 MCG/ML IJ SOLN (WRAP)
INTRAMUSCULAR | Status: AC
Start: 2023-04-10 — End: 2023-04-10
  Administered 2023-04-10: 25 ug via INTRAVENOUS
  Filled 2023-04-10: qty 2

## 2023-04-10 MED ORDER — MEPERIDINE HCL 25 MG/ML IJ SOLN
25.0000 mg | Freq: Once | INTRAMUSCULAR | Status: DC | PRN
Start: 2023-04-10 — End: 2023-04-10

## 2023-04-10 MED ORDER — ROCURONIUM BROMIDE 50 MG/5ML IV SOLN
INTRAVENOUS | Status: AC
Start: 2023-04-10 — End: ?
  Filled 2023-04-10: qty 5

## 2023-04-10 MED ORDER — FENTANYL CITRATE (PF) 50 MCG/ML IJ SOLN (WRAP)
INTRAMUSCULAR | Status: DC | PRN
Start: 2023-04-10 — End: 2023-04-10
  Administered 2023-04-10: 100 ug via INTRAVENOUS

## 2023-04-10 MED ORDER — ONDANSETRON HCL 4 MG/2ML IJ SOLN
4.0000 mg | Freq: Once | INTRAMUSCULAR | Status: AC | PRN
Start: 2023-04-10 — End: 2023-04-10

## 2023-04-10 MED ORDER — BENZOCAINE-MENTHOL MT LOZG (WRAP)
LOZENGE | OROMUCOSAL | Status: AC
Start: 2023-04-10 — End: 2023-04-10
  Administered 2023-04-10: 1 via BUCCAL
  Filled 2023-04-10: qty 1

## 2023-04-10 MED ORDER — FENTANYL CITRATE (PF) 50 MCG/ML IJ SOLN (WRAP)
25.0000 ug | INTRAMUSCULAR | Status: DC | PRN
Start: 2023-04-10 — End: 2023-04-10
  Administered 2023-04-10: 25 ug via INTRAVENOUS

## 2023-04-10 MED ORDER — OXYCODONE HCL 5 MG PO TABS
5.0000 mg | ORAL_TABLET | Freq: Once | ORAL | Status: DC | PRN
Start: 2023-04-10 — End: 2023-04-10

## 2023-04-10 MED ORDER — ONDANSETRON HCL 4 MG/2ML IJ SOLN
INTRAMUSCULAR | Status: AC
Start: 2023-04-10 — End: 2023-04-10
  Administered 2023-04-10: 4 mg via INTRAVENOUS
  Filled 2023-04-10: qty 2

## 2023-04-10 MED ORDER — ONDANSETRON HCL 4 MG/2ML IJ SOLN
INTRAMUSCULAR | Status: DC | PRN
Start: 2023-04-10 — End: 2023-04-10
  Administered 2023-04-10: 4 mg via INTRAVENOUS

## 2023-04-10 MED ORDER — PHENYLEPHRINE HCL 10 MG/ML IV SOLN (WRAP)
Status: DC | PRN
Start: 2023-04-10 — End: 2023-04-10
  Administered 2023-04-10 (×2): 200 ug via INTRAVENOUS
  Administered 2023-04-10: 100 ug via INTRAVENOUS
  Administered 2023-04-10: 200 ug via INTRAVENOUS

## 2023-04-10 MED ORDER — HYDROMORPHONE HCL 1 MG/ML IJ SOLN
0.5000 mg | INTRAMUSCULAR | Status: DC | PRN
Start: 2023-04-10 — End: 2023-04-10

## 2023-04-10 MED ORDER — ONDANSETRON HCL 4 MG PO TABS
4.0000 mg | ORAL_TABLET | Freq: Three times a day (TID) | ORAL | Status: AC | PRN
Start: 2023-04-10 — End: ?
  Administered 2023-04-12: 4 mg via ORAL
  Filled 2023-04-10 (×2): qty 1

## 2023-04-10 MED ORDER — LACTATED RINGERS IV SOLN
INTRAVENOUS | Status: DC | PRN
Start: 2023-04-10 — End: 2023-04-10

## 2023-04-10 MED ORDER — LIDOCAINE HCL (PF) 2 % IJ SOLN
INTRAMUSCULAR | Status: AC
Start: 2023-04-10 — End: ?
  Filled 2023-04-10: qty 5

## 2023-04-10 MED ORDER — PHENYLEPHRINE 100 MCG/ML IV SYRINGE FOR INFUSION (ANESTHESIA)
PREFILLED_SYRINGE | INTRAVENOUS | Status: DC | PRN
Start: 2023-04-10 — End: 2023-04-10
  Administered 2023-04-10: 50 ug/min via INTRAVENOUS

## 2023-04-10 MED ORDER — SUGAMMADEX SODIUM 200 MG/2ML IV SOLN
INTRAVENOUS | Status: DC | PRN
Start: 2023-04-10 — End: 2023-04-10
  Administered 2023-04-10: 400 mg via INTRAVENOUS

## 2023-04-10 MED ORDER — ALUM & MAG HYDROXIDE-SIMETH 200-200-20 MG/5ML PO SUSP
30.0000 mL | Freq: Once | ORAL | Status: AC
Start: 2023-04-11 — End: 2023-04-11
  Administered 2023-04-11: 30 mL via ORAL
  Filled 2023-04-10: qty 30

## 2023-04-10 MED ORDER — FUROSEMIDE 10 MG/ML IJ SOLN
INTRAMUSCULAR | Status: DC | PRN
Start: 2023-04-10 — End: 2023-04-10
  Administered 2023-04-10: 20 mg via INTRAVENOUS

## 2023-04-10 MED ORDER — ONDANSETRON HCL 4 MG/2ML IJ SOLN
INTRAMUSCULAR | Status: AC
Start: 2023-04-10 — End: 2023-04-11
  Administered 2023-04-11: 4 mg via INTRAVENOUS
  Filled 2023-04-10: qty 2

## 2023-04-10 MED ORDER — FAMOTIDINE 10 MG/ML IV SOLN (WRAP)
INTRAVENOUS | Status: DC | PRN
Start: 2023-04-10 — End: 2023-04-10
  Administered 2023-04-10: 20 mg via INTRAVENOUS

## 2023-04-10 MED ORDER — FAMOTIDINE 10 MG/ML IV SOLN (WRAP)
INTRAVENOUS | Status: AC
Start: 2023-04-10 — End: 2023-04-10
  Filled 2023-04-10: qty 2

## 2023-04-10 MED ORDER — FENTANYL CITRATE (PF) 50 MCG/ML IJ SOLN (WRAP)
INTRAMUSCULAR | Status: AC
Start: 2023-04-10 — End: ?
  Filled 2023-04-10: qty 2

## 2023-04-10 MED ORDER — DEXAMETHASONE SODIUM PHOSPHATE 4 MG/ML IJ SOLN (WRAP)
INTRAMUSCULAR | Status: DC | PRN
Start: 2023-04-10 — End: 2023-04-10
  Administered 2023-04-10: 4 mg via INTRAVENOUS

## 2023-04-10 MED ORDER — HYDRALAZINE HCL 20 MG/ML IJ SOLN
10.0000 mg | Freq: Once | INTRAMUSCULAR | Status: DC | PRN
Start: 2023-04-10 — End: 2023-04-10

## 2023-04-10 MED ORDER — MIDAZOLAM HCL 1 MG/ML IJ SOLN (WRAP)
INTRAMUSCULAR | Status: AC
Start: 2023-04-10 — End: ?
  Filled 2023-04-10: qty 2

## 2023-04-10 MED ORDER — BENZOCAINE-MENTHOL MT LOZG (WRAP)
1.0000 | LOZENGE | OROMUCOSAL | Status: DC | PRN
Start: 2023-04-10 — End: 2023-04-10

## 2023-04-10 MED ORDER — ROCURONIUM BROMIDE 10 MG/ML IV SOLN (WRAP)
INTRAVENOUS | Status: DC | PRN
Start: 2023-04-10 — End: 2023-04-10
  Administered 2023-04-10: 80 mg via INTRAVENOUS

## 2023-04-10 SURGICAL SUPPLY — 2 items
DEVICE BIOPSY ULTRASOUND FINE NEEDLE (GE Lab Supplies) ×1 IMPLANT
DEVICE BIOPSY ULTRASOUND FINE NEEDLE TAPER POINT CLIP OD25 GA ACQUIRE (GE Lab Supplies) ×1 IMPLANT

## 2023-04-10 NOTE — Plan of Care (Signed)
 NPO for planned procedure. Pain managed with roxicodone . IVF @ 77.    Problem: Moderate/High Fall Risk Score >5  Goal: Patient will remain free of falls  Outcome: Progressing  Moderate Risk (6-13):   LOW-Fall Interventions Appropriate for Low Fall Risk   LOW-Anticoagulation education for injury risk   MOD-Consider activation of bed alarm if appropriate   MOD-Apply bed exit alarm if patient is confused   MOD-Floor mat at bedside (where available) if appropriate   MOD-Consider a move closer to Nurses Station   MOD-Remain with patient during toileting   MOD-Place bedside commode and assistive devices out of sight when not in use   MOD-Re-orient confused patients   MOD-Utilize diversion activities   MOD-Perform dangle, stand, walk (DSW) prior to mobilization   MOD-Use gait belt when appropriate   MOD-Request PT/OT consult order for patients with gait/mobility impairment   MOD-include family in multidisciplinary POC discussions   MOD- Consider video monitoring     Problem: Pain interferes with ability to perform ADL  Goal: Pain at adequate level as identified by patient  Outcome: Progressing  Pain at adequate level as identified by patient:   Identify patient comfort function goal   Assess for risk of opioid induced respiratory depression, including snoring/sleep apnea. Alert healthcare team of risk factors identified.   Reassess pain within 30-60 minutes of any procedure/intervention, per Pain Assessment, Intervention, Reassessment (AIR) Cycle   Assess pain on admission, during daily assessment and/or before any as needed intervention(s)   Evaluate if patient comfort function goal is met   Consult/collaborate with Physical Therapy, Occupational Therapy, and/or Speech Therapy   Consult/collaborate with Pain Service   Offer non-pharmacological pain management interventions   Evaluate patient's satisfaction with pain management progress   Include patient/patient care companion in decisions related to pain management as  needed     Problem: Altered GI Function  Goal: Fluid and electrolyte balance are achieved/maintained  Outcome: Progressing  Fluid and electrolyte balance are achieved/maintained:   Monitor/assess lab values and report abnormal values   Assess and reassess fluid and electrolyte status  Goal: Elimination patterns are normal or improving  Outcome: Progressing  Elimination patterns are normal or improving: Assess for and discuss C. diff screening with LIP     Problem: Side Effects from Pain Analgesia  Goal: Patient will experience minimal side effects of analgesic therapy  Outcome: Progressing  Patient will experience minimal side effects of analgesic therapy:   Monitor/assess patient's respiratory status (RR depth, effort, breath sounds)   Assess for changes in cognitive function   Prevent/manage side effects per LIP orders (i.e. nausea, vomiting, pruritus, constipation, urinary retention, etc.)   Evaluate for opioid-induced sedation with appropriate assessment tool (i.e. POSS)

## 2023-04-10 NOTE — H&P (Signed)
 GI PRE PROCEDURE NOTE    Proceduralist Comments:   Review of Systems and Past Medical / Surgical History performed: Yes     Indications:Pancreatic mass    Previous Adverse Reaction to Anesthesia or Sedation (if yes, describe): No    Physical Exam / Laboratory Data (If applicable)   Airway Classification: Class III    General: Alert and cooperative  Lungs: Lungs clear to auscultation  Cardiac: RRR, normal S1S2.    Abdomen: Soft, non tender. Normal active bowel sounds  Other:     Recent CBC   Recent Labs     04/10/23  0459   Hemoglobin 13.6   Hematocrit 41.7   Platelet Count 360*     Recent CMP   Recent Labs     04/10/23  0459   Glucose 108*   BUN 10   Creatinine 0.7   Sodium 141   Chloride 110   CO2 21   Calcium  9.0   Albumin 3.7   AST (SGOT) 33   ALT 44   Globulin 3.0     Recent PT/INR   Recent Labs     04/10/23  0459   INR 1.2       American Society of Anesthesiologists (ASA) Physical Status Classification:   ASA 3 - Patient with moderate systemic disease with functional limitations    Planned Sedation:   Deep sedation with anesthesia    Attestation:   Valerie May has been reassessed immediately prior to the procedure and is an appropriate candidate for the planned sedation and procedure. Risks, benefits and alternatives to the planned procedure and sedation have been explained to the patient or guardian:  yes        Signed by: Carole CHRISTELLA Rice, MD

## 2023-04-10 NOTE — Discharge Summary (Signed)
 MEDICINE DISCHARGE SUMMARY    Date Time: 04/10/23 8:11 AM  Patient Name: [facility]  Attending Physician: No att. 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  History of eosinophilic asthma  Mild bronchiectasis  Migraine  GERD  Prediabetes    Disposition:      AMA    Pending Results, Recommendations & Instructions to providers after discharge:     Micro / Labs / Path pending:   Unresulted Labs       None          Wound Care Instructions: Not applicable  Date of completion for antibiotics or other medications: 3/11      Recent Labs:       Results       Procedure Component Value Units Date/Time    Hepatic Function Panel (LFT) [[id number]]  (Abnormal) Collected: 04/07/23 0439    Specimen: Blood, Venous Updated: 04/07/23 1155     Bilirubin, Total 0.5 mg/dL      Bilirubin Direct 0.2 mg/dL      Bilirubin Indirect 0.3 mg/dL      AST (SGOT) 35 U/L      ALT 62 U/L      Alkaline Phosphatase 140 U/L      Albumin 3.9 g/dL      Globulin 3.4 g/dL      Albumin/Globulin Ratio 1.1     Protein, Total 7.3 g/dL     PT/INR [[id number]]  (Normal) Collected: 04/07/23 0616    Specimen: Blood, Venous Updated: 04/07/23 0648     PT 12.9 sec      INR 1.1    Basic Metabolic Panel [[id number]]  (Abnormal) Collected: 04/07/23 0439    Specimen: Blood, Venous Updated: 04/07/23 0531     Glucose 96 mg/dL      BUN 9 mg/dL      Creatinine 0.7 mg/dL      Calcium  9.7 mg/dL      Sodium 859 mEq/L      Potassium 3.3 mEq/L      Chloride 102 mEq/L      CO2 29 mEq/L      Anion Gap 9.0     GFR >60.0 mL/min/1.73 m2     Magnesium  [[id number]]  (Normal) Collected: 04/07/23 0439    Specimen: Blood, Venous Updated: 04/07/23 0531     Magnesium  1.9 mg/dL     CBC without Differential [[id number]]  (Abnormal) Collected: 04/07/23 0439    Specimen: Blood, Venous Updated: 04/07/23 0531     WBC 8.91 x10 3/uL       Hemoglobin 14.0 g/dL      Hematocrit 58.0 %      Platelet Count 416 x10 3/uL      MPV 9.8 fL      RBC 4.74 x10 6/uL      MCV 88.4 fL      MCH 29.5 pg      MCHC 33.4 g/dL      RDW 12 %      nRBC % 0.0 /100 WBC      Absolute nRBC 0.00 x10 3/uL     Basic Metabolic Panel [[id number]]  (Abnormal) Collected: 04/06/23 1633    Specimen: Blood, Venous Updated: 04/06/23 1715     Glucose 112 mg/dL      BUN 7 mg/dL      Creatinine 0.5 mg/dL  Calcium  9.4 mg/dL      Sodium 858 mEq/L      Potassium 3.8 mEq/L      Chloride 105 mEq/L      CO2 26 mEq/L      Anion Gap 10.0     GFR >60.0 mL/min/1.73 m2     Magnesium  [[id number]]  (Normal) Collected: 04/06/23 1633    Specimen: Blood, Venous Updated: 04/06/23 1715     Magnesium  1.8 mg/dL     Whole Blood Glucose POCT [[id number]]  (Abnormal) Collected: 04/06/23 1607    Specimen: Blood, Capillary Updated: 04/06/23 1625     Whole Blood Glucose POCT 110 mg/dL     Whole Blood Glucose POCT [[id number]]  (Abnormal) Collected: 04/06/23 1113    Specimen: Blood, Capillary Updated: 04/06/23 1115     Whole Blood Glucose POCT 122 mg/dL     Chromogranin A [[id number]] Collected: 04/03/23 0302    Specimen: Blood, Venous Updated: 04/06/23 0929     Chromogranin A, S 43 ng/mL     Whole Blood Glucose POCT [[id number]]  (Abnormal) Collected: 04/06/23 0654    Specimen: Blood, Capillary Updated: 04/06/23 0715     Whole Blood Glucose POCT 102 mg/dL     Basic Metabolic Panel [[id number]]  (Abnormal) Collected: 04/06/23 0422    Specimen: Blood, Venous Updated: 04/06/23 0512     Glucose 97 mg/dL      BUN 6 mg/dL      Creatinine 0.6 mg/dL      Calcium  9.4 mg/dL      Sodium 859 mEq/L      Potassium 3.3 mEq/L      Chloride 98 mEq/L      CO2 31 mEq/L      Anion Gap 11.0     GFR >60.0 mL/min/1.73 m2     CBC without Differential [[id number]]  (Abnormal) Collected: 04/06/23 0422    Specimen: Blood, Venous Updated: 04/06/23 0504     WBC 8.08 x10 3/uL      Hemoglobin 13.3 g/dL      Hematocrit 59.1 %      Platelet  Count 406 x10 3/uL      MPV 9.6 fL      RBC 4.55 x10 6/uL      MCV 89.7 fL      MCH 29.2 pg      MCHC 32.6 g/dL      RDW 12 %      nRBC % 0.0 /100 WBC      Absolute nRBC 0.00 x10 3/uL     Whole Blood Glucose POCT [[id number]]  (Normal) Collected: 04/05/23 2104    Specimen: Blood, Capillary Updated: 04/05/23 2105     Whole Blood Glucose POCT 92 mg/dL     Whole Blood Glucose POCT [[id number]]  (Abnormal) Collected: 04/05/23 1629    Specimen: Blood, Capillary Updated: 04/05/23 1631     Whole Blood Glucose POCT 149 mg/dL     Cancer Antigen 19-9 [[id number]] Collected: 04/03/23 0302    Specimen: Blood, Venous Updated: 04/05/23 1336     Cancer Antigen 19-9 15 U/mL     Whole Blood Glucose POCT [[id number]]  (Normal) Collected: 04/05/23 1205    Specimen: Blood, Capillary Updated: 04/05/23 1206     Whole Blood Glucose POCT 99 mg/dL     Whole Blood Glucose POCT [[id number]]  (Abnormal) Collected: 04/05/23 0756    Specimen: Blood, Capillary Updated: 04/05/23 0758     Whole Blood Glucose POCT 118 mg/dL     Basic Metabolic Panel [  [id number]]  (Abnormal) Collected: 04/05/23 0337    Specimen: Blood, Venous Updated: 04/05/23 0434     Glucose 97 mg/dL      BUN 6 mg/dL      Creatinine 0.5 mg/dL      Calcium  9.1 mg/dL      Sodium 862 mEq/L      Potassium 3.6 mEq/L      Chloride 96 mEq/L      CO2 31 mEq/L      Anion Gap 10.0     GFR >60.0 mL/min/1.73 m2     CBC without Differential [[id number]]  (Abnormal) Collected: 04/05/23 0337    Specimen: Blood, Venous Updated: 04/05/23 0413     WBC 8.99 x10 3/uL      Hemoglobin 13.1 g/dL      Hematocrit 59.7 %      Platelet Count 366 x10 3/uL      MPV 9.7 fL      RBC 4.36 x10 6/uL      MCV 92.2 fL      MCH 30.0 pg      MCHC 32.6 g/dL      RDW 12 %      nRBC % 0.0 /100 WBC      Absolute nRBC 0.00 x10 3/uL     Whole Blood Glucose POCT [[id number]]  (Abnormal) Collected: 04/05/23 0358    Specimen: Blood, Capillary Updated: 04/05/23 0358     Whole Blood Glucose POCT 105 mg/dL     Whole Blood Glucose  POCT [[id number]]  (Abnormal) Collected: 04/04/23 2113    Specimen: Blood, Capillary Updated: 04/04/23 2114     Whole Blood Glucose POCT 118 mg/dL     Whole Blood Glucose POCT [[id number]]  (Abnormal) Collected: 04/04/23 1602    Specimen: Blood, Capillary Updated: 04/04/23 1603     Whole Blood Glucose POCT 144 mg/dL     Whole Blood Glucose POCT [[id number]]  (Normal) Collected: 04/04/23 1135    Specimen: Blood, Capillary Updated: 04/04/23 1137     Whole Blood Glucose POCT 100 mg/dL     Venous Blood Gas [[id number]]  (Abnormal) Collected: 04/04/23 1020    Specimen: Blood, Venous Updated: 04/04/23 1028     Venous pH 7.378     Venous PCO2 58.6 mmHg      Venous PO2 56.9 mmHg      Venous HCO3 33.9 mEq/L      Venous Total CO2 35.7 mEq/L      Venous Base Excess 7.2 mEq/L      Venous O2 Saturation 90.6 %      Temperature 36.6 C      Collection Site Venous    Hepatic Function Panel (LFT) [[id number]]  (Abnormal) Collected: 04/04/23 0352    Specimen: Blood, Venous Updated: 04/04/23 1019     Bilirubin, Total 0.4 mg/dL      Bilirubin Direct 0.2 mg/dL      Bilirubin Indirect 0.2 mg/dL      AST (SGOT) 70 U/L      ALT 132 U/L      Alkaline Phosphatase 187 U/L      Albumin 3.3 g/dL      Globulin 3.3 g/dL      Albumin/Globulin Ratio 1.0     Protein, Total 6.6 g/dL     Whole Blood Glucose POCT [[id number]]  (Abnormal) Collected: 04/04/23 0630    Specimen: Blood, Capillary Updated: 04/04/23 0823     Whole Blood Glucose POCT 120 mg/dL     Culture, Sputum and Lower  Respiratory [[id number]] Collected: 04/02/23 0847    Specimen: Sputum, Expectorated Updated: 04/04/23 0705     Culture Respiratory Light growth of Mixed upper respiratory flora     Gram Stain Many WBCs      Few Squamous epithelial cells      Many Mixed respiratory flora    Narrative:      No Pseudomonas aeruginosa, No Staphylococcus aureus, No Methicillin Resistant Staphylococcus aureus (MRSA).    Basic Metabolic Panel [[id number]]  (Abnormal) Collected: 04/04/23 0352     Specimen: Blood, Venous Updated: 04/04/23 0454     Glucose 104 mg/dL      BUN 9 mg/dL      Creatinine 0.6 mg/dL      Calcium  8.9 mg/dL      Sodium 862 mEq/L      Potassium 3.9 mEq/L      Chloride 97 mEq/L      CO2 31 mEq/L      Anion Gap 9.0     GFR >60.0 mL/min/1.73 m2     Magnesium  [[id number]]  (Normal) Collected: 04/04/23 0352    Specimen: Blood, Venous Updated: 04/04/23 0454     Magnesium  2.0 mg/dL     Phosphorus [[id number]]  (Normal) Collected: 04/04/23 0352    Specimen: Blood, Venous Updated: 04/04/23 0454     Phosphorus 3.9 mg/dL     CBC without Differential [[id number]]  (Abnormal) Collected: 04/04/23 0352    Specimen: Blood, Venous Updated: 04/04/23 0432     WBC 9.26 x10 3/uL      Hemoglobin 12.7 g/dL      Hematocrit 60.5 %      Platelet Count 356 x10 3/uL      MPV 9.4 fL      RBC 4.23 x10 6/uL      MCV 93.1 fL      MCH 30.0 pg      MCHC 32.2 g/dL      RDW 12 %      nRBC % 0.0 /100 WBC      Absolute nRBC 0.00 x10 3/uL     Whole Blood Glucose POCT [[id number]]  (Abnormal) Collected: 04/03/23 2205    Specimen: Blood, Capillary Updated: 04/03/23 2206     Whole Blood Glucose POCT 148 mg/dL             Procedures/Radiology performed:   Radiology: all results from this admission  CT Angio Chest (PE or trauma protocol)    Result Date: 04/09/2023  No acute pulmonary embolism. Garnette Rouse, MD 04/09/2023 3:34 AM    CT Abd/ Pelvis with IV Contrast    Result Date: 04/09/2023  1.Minimal fat stranding/fluid adjacent to the pancreatic head may represent postbiopsy changes. Recommend correlation with lipase to exclude acute pancreatitis. 2.Slightly increased size of the pancreatic head lesion, potentially reflecting intralesional hemorrhage post biopsy. Garnette Rouse, MD 04/09/2023 3:24 AM    XR Chest  AP Portable    Result Date: 04/09/2023  No acute pulmonary process. Odella Edelson, MD 04/09/2023 12:13 AM    XR Chest AP Portable    Result Date: 04/04/2023   No acute abnormality within the chest. Elwood Miyamoto, MD 04/04/2023 10:14  AM    XR Chest AP Portable    Result Date: 04/03/2023  1. Endotracheal tube in good position. 2. Mild hypoinflation with central vascular prominence and peribronchial thickening. 3. Remainder as above. Wolm Luis, MD 04/03/2023 12:08 PM    XR Chest AP Portable    Result Date: 03/28/2023   No acute  cardiopulmonary disease. Reena D'Heureux, MD 03/28/2023 10:21 AM    MRI Abdomen W WO Contrast MRCP    Result Date: 03/24/2023   Exam quality degraded by artifact arising from motion. The following observations are made within these confines. 1.Pancreatic head 1.2 cm enhancing lesion, suspected neoplasm. This lesion appears confined of the pancreas and demonstrates no major vascular interfaces. Recommend EUS for further evaluation. 2.No findings suspicious for metastatic disease within the imaged abdomen. 3.Mid right renal 1.7 cm cyst, recently demonstrating intermediate density by CT and is benign. 4.Hepatic steatosis. 5.Colonic diverticulosis. Elwood Miyamoto, MD 03/24/2023 8:49 AM    CT Head WO Contrast    Result Date: 03/22/2023   No acute intracranial abnormality. Debby ONEIDA Sewer, MD 03/22/2023 7:28 PM    Shoulder Right 2+ Views    Result Date: 03/21/2023  1. No fracture of the shoulder. 2. Old healed fractures of the right lateral third and fourth ribs. Derick Clap, MD 03/21/2023 2:09 PM    CT Abd/Pelvis with IV Contrast only    Result Date: 03/21/2023  1.No acute abnormality within the chest, abdomen, or pelvis. 2.No findings of acute pulmonary embolism within the limitations stated above. 3.Hyperdense pancreatic head lesion. Further evaluation with MRI/MRCP is recommended. 4.Mildly hyperdense right renal lesion, possible hemorrhagic/proteinaceous cyst. This can also be further evaluated on MRI. 5.Additional chronic findings as above. Sue CHRISTELLA Fila, DO 03/21/2023 8:51 AM    CT Angio Chest (PE study)    Result Date: 03/21/2023  1.No acute abnormality within the chest, abdomen, or pelvis. 2.No findings of acute pulmonary embolism  within the limitations stated above. 3.Hyperdense pancreatic head lesion. Further evaluation with MRI/MRCP is recommended. 4.Mildly hyperdense right renal lesion, possible hemorrhagic/proteinaceous cyst. This can also be further evaluated on MRI. 5.Additional chronic findings as above. Sue CHRISTELLA Fila, DO 03/21/2023 8:51 AM    Chest AP Portable    Result Date: 03/21/2023  No acute pulmonary process. Odella Edelson, MD 03/21/2023 5:31 AM      Hospital Course:     Reason for admission/ HPI: Nausea vomiting and abdominal pain     Hospital Course: 53 y.o. female with a history of eosinophilic asthma, mild bronchiectasis, recurrent pneumonia/bronchitis, migraine , prolonged admit in [place]  for COVID-19 in 2021, sinus disease, GERD, prediabetic who presented to [facility]/21/2023 with intractable nausea and vomiting, abdominal pain for 2 months.  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  Repeat biopsy attempted on 3/7 had to be aborted due to excessive food residue.  Procedure was complicated by failure to extubate due to low tidal volumes and hypercarbia resulting in upgrade to ICU.  Patient was extubated on 3/7 and is planned for repeat biopsy today.  Patient was downgraded to medicine on 3/11 with repeat EUS/EGD scheduled for the same day but it had to be postponed due to pathology not being available.  Patient left AMA despite understanding the possibility of worsening abdominal pain, nausea, vomiting, progression of possible cancer and even death.               Discharge Day Exam:     General: awake, alert X 3  Cardiovascular: regular rate and rhythm, no murmurs, rubs or gallops  Lungs: clear to auscultation bilaterally, without wheezing, rhonchi, or rales  Abdomen: soft, non-tender, non-distended; no palpable masses,  normoactive bowel sounds  Extremities: no edema           Additional Diagnoses:  Consultations:      Treatment Team:   Consulting Physician: Hendricks Leeroy BROCKS, MD    Discharge Condition:     Stable    Discharge Instructions & Follow Up Plan for Patient:     Diet: Clear liquid    Activity/Weight Bearing Status: Independent      Patient was instructed to follow up with:      Follow-up Information       Michiel Olam RAMAN, FNP .    Specialty: Family Nurse Practitioner  Contact information:  [address]  [place] [address]  [phone]                               Discharge Code Status: Full code      Complete instructions and follow up are in the patient's After Visit Summary    Minutes spent coordinating discharge and reviewing discharge plan: 45 minutes    Discharge Medications:        Discharge Medication List        Taking      acetaminophen  650 MG CR tablet  What changed:   how much to take  how to take this  reasons to take this  Commonly known as: TYLENOL   TAKE 1 TABLET BY MOUTH EVERY 8 HOURS AS NEEDED FOR PAIN     albuterol  sulfate HFA 108 (90 Base) MCG/ACT inhaler  Dose: 1-2 puff  Commonly known as: PROVENTIL   Inhale 1-2 puffs into the lungs every 4 (four) hours as needed     benzonatate  200 MG capsule  Dose: 200 mg  Commonly known as: TESSALON   Take 1 capsule (200 mg) by mouth 3 (three) times daily as needed for Cough     Budeson-Glycopyrrol-Formoterol  160-9-4.8 MCG/ACT Aero  Dose: 2 puff  Inhale 2 puffs into the lungs 2 (two) times daily     * budesonide -formoterol  160-4.5 MCG/ACT inhaler  Dose: 2 puff  What changed: You were already taking a medication with the same name, and this prescription was added. Make sure you understand how and when to take each.  Commonly known as: SYMBICORT   Inhale 2 puffs into the lungs 2 (two) times daily     * budesonide -formoterol  160-4.5 MCG/ACT inhaler  Dose: 2 puff  What changed: Another medication with the same name was added. Make sure you understand how and when to take each.  Commonly known as: SYMBICORT   Inhale 2 puffs into the lungs 2 (two) times daily as needed      cetirizine  10 MG tablet  Dose: 10 mg  Commonly known as: ZyrTEC   Take 1 tablet (10 mg) by mouth daily     Excedrin PM 500-38 MG Tabs  Generic drug: diphenhydrAMINE -APAP (sleep)  Take by mouth daily     metFORMIN 500 MG tablet  Dose: 500 mg  Commonly known as: GLUCOPHAGE  Take 1 tablet (500 mg) by mouth 2 (two) times daily with meals     montelukast  10 MG tablet  Dose: 10 mg  Commonly known as: SINGULAIR   Take 1 tablet (10 mg) by mouth nightly     pantoprazole  40 MG tablet  What changed:   how much to take  how to take this  when to take this  reasons to take this  Commonly known as: PROTONIX   TAKE 1 TABLET BY MOUTH EVERY DAY     umeclidinium bromide  62.5 MCG/ACT Aepb  Dose: 1 puff  Commonly known as:  INCRUSE ELLIPTA   Inhale 1 puff into the lungs daily     venlafaxine  25 MG tablet  Dose: 25 mg  Commonly known as: EFFEXOR   Take 1 tablet (25 mg) by mouth 3 (three) times daily with meals for 15 days     vitamin D  (ergocalciferol ) 50000 UNIT Caps  Dose: 50,000 Units  Commonly known as: DRISDOL   Take 1 capsule (50,000 Units total) by mouth once a week           * This list has 2 medication(s) that are the same as other medications prescribed for you. Read the directions carefully, and ask your doctor or other care provider to review them with you.                STOP taking these medications      [facility] Aspirin  Low Dose 81 MG EC tablet  Generic drug: aspirin  EC     famotidine  10 MG tablet  Commonly known as: PEPCID      methocarbamol 750 MG tablet  Commonly known as: ROBAXIN     naproxen  500 MG EC tablet  Commonly known as: EC NAPROSYN      ondansetron  8 MG tablet  Commonly known as: ZOFRAN                   (FYI: you must refresh the link after final D/C Med reconciliation)      Immunizations provided:         [facility]  Department of Medicine  P: [phone]  F: [phone]    Signed by: Marisue Manila, MD, MD    CC: Michiel Olam RAMAN, FNP

## 2023-04-10 NOTE — Malnutrition Assessment (Signed)
 Valerie May is a 53 y.o. female patient.   [id number]    Malnutrition Assessment   Malnutrition Documentation    Moderate malnutrition--related to inadequate protein energy intake  in the setting of acute illness as evidenced by intake <75% of estimated energy requirements for >1 week, >5% weight loss x 1 month.        Rockie HERO. Eugen Jeansonne, RD, CNSC  Clinical Dietitian Specialist II  # [phone], # [phone] (main RD line)        If physician disagrees with this assessment see addendum.

## 2023-04-10 NOTE — Progress Notes (Addendum)
 MEDICINE PROGRESS NOTE    Date Time: 04/10/23 3:49 PM  Patient Name: [facility] D  Attending Physician: Karyle Salvo, MD    Assessment / Plan:        53 year old female with past medical history of asthma, eosinophilia mild bronchiectasis, recurrent pneumonia, GERD, diabetes 2, polycythemia, obesity, prior acute hypercarbic respiratory failure presented with nausea, vomiting and abdominal pain.She was recently admitted to the hospital on 2/22 with nausea vomiting, abdominal pain and altered mental status.  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     #Intractable nausea, vomiting  #Diarrhea  #Abdominal pain  #Heartburn  #Pancreatic head lesion  Lipase, lactic acid within normal limit  CT abdomen pelvis showed minimal fluid adjacent to pancreatic head. ? intralesional hemorrhage postbiopsy  GI consulted, plan for EUS  Diarrhea resolved  Follow-up stool studies  IV Zofran , QTc 447  Status post IV fluid  Respiratory panel negative  Continue supportive care  Hold off on IV antibiotic for now           #Diabetes 2  Last A1c 6  Monitor blood glucose  Insulin  sliding scale     #GERD  PPI     #Asthma  #Obesity  Continue Symbicort , Singulair , albuterol  as needed  Given recent acute hypercarbic respiratory failure plan to avoid sedating meds  Will need sleep study      #Thrombocytosis  Improving  Likely reactive  Will monitor        DVT prophylaxis: Currently on hold       CODE STATUS full code      Case discussed with: RN    Additional Diagnoses:   Malnutrition Documentation    Moderate malnutrition--related to inadequate protein energy intake  in the setting of acute illness as evidenced by intake <75% of estimated energy requirements for >1 week, >5% weight loss  x 1 month.                Safety Checklist:     DVT prophylaxis:  CHEST guideline (See page [id number]) No   Foley:  [place] Rn Foley protocol Not present   IVs:  Peripheral IV   PT/OT: Not needed   Daily CBC & or Chem ordered:  SHM/ABIM guidelines (see #5) Yes, due to clinical and lab instability   Reference for approximate charges of common labs: CBC auto diff - $76  BMP - $99  Mg - $79    Lines:     Patient Lines/Drains/Airways Status       Active PICC Line / CVC Line / PIV Line / Drain / Airway / Intraosseous Line / Epidural Line / ART Line / Line / Wound / Pressure Ulcer / NG/OG Tube       Name Placement date Placement time Site Days    Peripheral IV 04/09/23 20 G Diffusion Left Antecubital 04/09/23  0128  Antecubital  1    Peripheral IV 04/09/23 22 G Diffusion Left Hand 04/09/23  0119  Hand  1    ETT  7 mm 04/10/23  1502  -- less than 1                     Disposition: (Please see PAF column for Expected D/C Date)   Today's date: 04/10/2023  Admit Date: 04/08/2023 10:41 PM  LOS: 1         Subjective     CC: Intractable abdominal pain    Interval History/24 hour events: Abdominal pain     HPI/Subjective: No further diarrhea.  Feels constipated.  Abdominal pain, nausea, vomiting improving.  No chest pain, shortness of breath or urinary symptoms.  No temperature spikes.    Review of Systems:     As per HPI    Physical Exam:     VITAL SIGNS PHYSICAL EXAM   Temp:  [97.7 F (36.5 C)-99 F (37.2 C)] 98 F (36.7 C)  Heart Rate:  [88-121] 88  Resp Rate:  [14-23] 18  BP: (101-129)/(61-77) 115/65  Blood Glucose:         No intake or output data in the 24 hours ending 04/10/23 1549 Physical Exam  General: awake, alert   Cardiovascular: regular rate and rhythm, no murmurs, rubs or gallops  Lungs: clear to auscultation bilaterally, without wheezing, rhonchi, or rales  Abdomen: soft, diffuse tender non-distended;   normoactive bowel sounds  Extremities: no edema         Meds:     Medications were reviewed:  Current  Facility-Administered Medications   Medication Dose Route Frequency    [Transfer Hold] budesonide -formoterol   2 puff Inhalation BID    [Transfer Hold] cetirizine   10 mg Oral Daily    [Transfer Hold] insulin  lispro  1-5 Units Subcutaneous TID AC    [Transfer Hold] montelukast   10 mg Oral QHS    [Transfer Hold] pantoprazole   40 mg Intravenous Daily    [Transfer Hold] senna-docusate  1 tablet Oral QHS    [Transfer Hold] umeclidinium bromide   1 puff Inhalation QAM    [Transfer Hold] venlafaxine   25 mg Oral TID MEALS    [Transfer Hold] vitamin D  (ergocalciferol )  50,000 Units Oral Weekly     Infusion Meds[1]  PRN Medications[2]      Labs:     Labs (last 72 hours):    Recent Labs   Lab 04/10/23  0459 04/08/23  2327   WBC 6.90 8.89   Hemoglobin 13.6 14.4   Hematocrit 41.7 42.1   Platelet Count 360* 398*       Recent Labs   Lab 04/10/23  0459 04/07/23  0616   PT 13.2* 12.9   INR 1.2 1.1   PTT 33  --     Recent Labs   Lab 04/10/23  0459 04/08/23  2327   Sodium 141 141   Potassium 3.6 4.0   Chloride 110 107   CO2 21 25   BUN 10 15   Creatinine 0.7 0.6   Calcium  9.0 10.2   Albumin 3.7 4.2   Protein, Total 6.7 7.0   Bilirubin, Total 0.6 0.5   Alkaline Phosphatase 113 138*   ALT 44 54   AST (SGOT) 33 30   Glucose 108* 118*                   Microbiology, reviewed and are significant for:  Microbiology Results (last 15 days)       Procedure Component Value Units Date/Time    Stool Enteric Bacteria Panel - Salmonella, Shigella, Campylobacter And Shiga Toxin, PCR [[id number]] Collected: 04/09/23 1654    Order Status: Canceled Specimen: Stool Updated: 04/09/23 1733    Stool Cryptosporidium parvum and Giardia lamblia, Antigen [[id number]]  (Normal) Collected: 04/09/23 1654    Order Status: Completed Specimen: Stool Updated: 04/10/23 9573  Stool Cryptosporidium parvum antigen Negative     Stool Giardia lamblia antigen Negative    Respiratory Pathogen Panel With COVID-19, PCR [[id number]]  (Normal) Collected: 04/09/23 1653    Order  Status: Completed Specimen: Nasopharyngeal Swab from Nasopharynx Updated: 04/10/23 0509     Adenovirus DNA Not Detected     Coronavirus HKU1 RNA Not Detected     Coronavirus NL63 RNA Not Detected     Coronavirus 229E RNA Not Detected     Coronavirus OC43 RNA Not Detected     Human Metapneumovirus RNA Not Detected     Human Rhinovirus/Enterovirus RNA Not Detected     Influenza A RNA Not Detected     Influenza B RNA Not Detected     Parainfluenza Virus 1 RNA Not Detected     Parainfluenza Virus 2 RNA Not Detected     Parainfluenza Virus 3 RNA Not Detected     Parainfluenza Virus 4 RNA Not Detected     Respiratory Syncytial Virus RNA Not Detected     Bordetella pertussis DNA Not Detected     Bordetella parapertussis DNA Not Detected     Chlamydophila pneumoniae DNA Not Detected     Mycoplasma pneumoniae DNA Not Detected     SARS-CoV-2 (COVID-19) RNA Not Detected    Narrative:      Testing performed using the [employer] FilmArray Respiratory Panel (RP 2.1).  This test is for the qualitative detection of respiratory pathogen nucleic acid. This assay cannot differentiate between Rhinovirus/Enterovirus. If necessary for patient care, a positive result for Rhinovirus/Enterovirus may be followed-up using an alternate method. This assay should not be used if B. pertussis infection is specifically suspected as it is less sensitive than other alternatives. If suspected, ensure that B. pertussis specific testing is ordered. Recent administration of a nasal influenza vaccine may cause false positive results for Influenza A and/or Influenza B.    Viral and bacterial nucleic acids may persist even though no viable organism is present. Detection of nucleic acid does not imply that the corresponding organisms are infectious or are the causative agents of clinical symptoms. Positive results of this test do not rule out coinfection with other organisms. A negative result does not exclude the possibility of viral or bacterial infection.   Assay performance characteristics may vary with circulating strains and this assay may not be able to distinguish between existing viral strains and new variants as they emerge. Results of this test should not be used as the sole basis for diagnosis, treatment, or other patient management decisions.    This assay is FDA authorized for nasopharyngeal swab samples.  Performance characteristics for Bronchoalveolar lavage samples have been determined by the [facility] laboratory. Other sample types are unacceptable.     Invalid results may be due to inhibiting substances in the specimen and recollection should occur.       Stool Clostridioides difficile Toxin B, PCR [[id number]]     Order Status: Sent Specimen: Stool     Culture, Urine [[id number]] Collected: 04/09/23 0058    Order Status: Completed Specimen: Urine, Clean Catch Updated: 04/10/23 1011    Narrative:      1,000-9,000 CFU/mL of multiple bacterial morphotypes present.  Possible contamination, appropriate recollection is requested if clinically indicated.    Culture, Blood, Aerobic And Anaerobic [[id number]] Collected: 04/08/23 2327    Order Status: Completed Specimen: Blood, Venous Updated: 04/10/23 0600     Culture Blood No growth at 1 day    Nares, MRSA (methicillin-resistant Staphylococcus aureus)  Screening, PCR [[id number]]  (Normal) Collected: 04/03/23 1314    Order Status: Completed Specimen: Swab from Nares Updated: 04/03/23 1444     MRSA (methicillin resistant Staphylococcus aureus) DNA Not Detected     Comment: Testing performed with the Xpert MRSA NxG assay. This test is intended for the detection of methicillin-resistant Staphylococcus aureus (MRSA) DNA. This test is not intended to diagnose, guide, or monitor treatment for MRSA infections, or provide results of susceptibility to methicillin. A positive test result does not necessarily indicate the presence of viable organisms. A negative result does not preclude MRSA nasal colonization.        Respiratory Pathogen Panel With COVID-19, PCR [[id number]]  (Normal) Collected: 04/02/23 1017    Order Status: Completed Specimen: Nasopharyngeal Swab from Nasopharynx Updated: 04/02/23 1855     Adenovirus DNA Not Detected     Coronavirus HKU1 RNA Not Detected     Coronavirus NL63 RNA Not Detected     Coronavirus 229E RNA Not Detected     Coronavirus OC43 RNA Not Detected     Human Metapneumovirus RNA Not Detected     Human Rhinovirus/Enterovirus RNA Not Detected     Influenza A RNA Not Detected     Influenza B RNA Not Detected     Parainfluenza Virus 1 RNA Not Detected     Parainfluenza Virus 2 RNA Not Detected     Parainfluenza Virus 3 RNA Not Detected     Parainfluenza Virus 4 RNA Not Detected     Respiratory Syncytial Virus RNA Not Detected     Bordetella pertussis DNA Not Detected     Bordetella parapertussis DNA Not Detected     Chlamydophila pneumoniae DNA Not Detected     Mycoplasma pneumoniae DNA Not Detected     SARS-CoV-2 (COVID-19) RNA Not Detected    Narrative:      Testing performed using the [employer] FilmArray Respiratory Panel (RP 2.1).  This test is for the qualitative detection of respiratory pathogen nucleic acid. This assay cannot differentiate between Rhinovirus/Enterovirus. If necessary for patient care, a positive result for Rhinovirus/Enterovirus may be followed-up using an alternate method. This assay should not be used if B. pertussis infection is specifically suspected as it is less sensitive than other alternatives. If suspected, ensure that B. pertussis specific testing is ordered. Recent administration of a nasal influenza vaccine may cause false positive results for Influenza A and/or Influenza B.    Viral and bacterial nucleic acids may persist even though no viable organism is present. Detection of nucleic acid does not imply that the corresponding organisms are infectious or are the causative agents of clinical symptoms. Positive results of this test do not rule out coinfection with  other organisms. A negative result does not exclude the possibility of viral or bacterial infection.  Assay performance characteristics may vary with circulating strains and this assay may not be able to distinguish between existing viral strains and new variants as they emerge. Results of this test should not be used as the sole basis for diagnosis, treatment, or other patient management decisions.    This assay is FDA authorized for nasopharyngeal swab samples.  Performance characteristics for Bronchoalveolar lavage samples have been determined by the [facility] laboratory. Other sample types are unacceptable.     Invalid results may be due to inhibiting substances in the specimen and recollection should occur.       Culture, Sputum and Lower Respiratory [[id number]] Collected: 04/02/23 0847    Order Status: Completed Specimen: Sputum,  Expectorated Updated: 04/04/23 0705     Culture Respiratory Light growth of Mixed upper respiratory flora     Gram Stain Many WBCs      Few Squamous epithelial cells      Many Mixed respiratory flora    Narrative:      No Pseudomonas aeruginosa, No Staphylococcus aureus, No Methicillin Resistant Staphylococcus aureus (MRSA).            Imaging and labs reviewed         Signed by: Wayna Majors, MD                [1]   Current Facility-Administered Medications   Medication Dose Route Frequency Last Rate    sodium chloride    Intravenous Continuous Stopped (04/10/23 1255)   [2]   Current Facility-Administered Medications   Medication Dose Route    [Transfer Hold] acetaminophen   650 mg Oral    [Transfer Hold] albuterol   2.5 mg Nebulization    [Transfer Hold] albuterol -ipratropium  3 mL Nebulization    [Transfer Hold] benzocaine -menthol   1 lozenge Buccal    [Transfer Hold] benzonatate   200 mg Oral    [Transfer Hold] carboxymethylcellulose sodium  1 drop Both Eyes    [Transfer Hold] dextrose   15 g of glucose Oral    Or    [Transfer Hold] dextrose   12.5 g Intravenous    Or    [Transfer Hold]  dextrose   12.5 g Intravenous    Or    [Transfer Hold] glucagon  (rDNA)  1 mg Intramuscular    [Transfer Hold] dextrose   15 g of glucose Oral    Or    [Transfer Hold] dextrose   12.5 g Intravenous    Or    [Transfer Hold] dextrose   12.5 g Intravenous    Or    [Transfer Hold] glucagon  (rDNA)  1 mg Intramuscular    [Transfer Hold] HYDROmorphone   0.4 mg Intravenous    [Transfer Hold] magnesium  sulfate  1 g Intravenous    [Transfer Hold] melatonin  3 mg Oral    [Transfer Hold] naloxone   0.2 mg Intravenous    [Transfer Hold] oxyCODONE   10 mg Oral    [Transfer Hold] oxyCODONE   5 mg Oral    [Transfer Hold] potassium & sodium phosphates   2 packet Oral    [Transfer Hold] potassium chloride   0-60 mEq Oral    Or    [Transfer Hold] potassium chloride   0-60 mEq Oral    Or    [Transfer Hold] potassium chloride   10 mEq Intravenous    [Transfer Hold] saline  2 spray Each Nare

## 2023-04-10 NOTE — Plan of Care (Signed)
 GI Post-Procedure Note:    Procedure completed. Please review ProVation note for details. 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 Recommend surgical oncology and oncology consult during this admission. Follow up with path report. Ok to resume diet. Call us  back with questions or new developments. GI service will sign-off.     Carole CHRISTELLA Rice, MD

## 2023-04-10 NOTE — Anesthesia Preprocedure Evaluation (Signed)
 Anesthesia Evaluation    AIRWAY    Mallampati: II    TM distance: >3 FB  Neck ROM: full    Planned to use difficult airway equipment: No CARDIOVASCULAR    cardiovascular exam normal       DENTAL    no notable dental hx               PULMONARY    pulmonary exam normal     OTHER FINDINGS                                        Relevant Problems   ANESTHESIA   (+) Obstructive sleep apnea (adult) (pediatric)      PULMONARY   (+) Obstructive sleep apnea (adult) (pediatric)      NEURO/PSYCH   (+) Intractable chronic migraine without aura   (+) Ischemic cerebrovascular accident (CVA) (CMS/HCC)      CARDIO   (+) Intractable chronic migraine without aura      GI   (+) GERD (gastroesophageal reflux disease)               Anesthesia Plan    ASA 3     general                                                      Signed by: Almarie MARLA Church, MD 04/10/23 1:34 PM

## 2023-04-10 NOTE — Plan of Care (Signed)
 Pt is AOX4. Denies SOB and CP. No BM this shift. Pt went for EGD. PRN pain med given once. Call bell and belongings within reach. Safety precaution maintained throughout the shift       Problem: Moderate/High Fall Risk Score >5  Goal: Patient will remain free of falls  Outcome: Progressing  Flowsheets (Taken 04/10/2023 2017)  Moderate Risk (6-13):   LOW-Fall Interventions Appropriate for Low Fall Risk   MOD-Consider activation of bed alarm if appropriate   MOD-Floor mat at bedside (where available) if appropriate   MOD-Perform dangle, stand, walk (DSW) prior to mobilization   LOW-Anticoagulation education for injury risk     Problem: Pain interferes with ability to perform ADL  Goal: Pain at adequate level as identified by patient  Outcome: Progressing  Flowsheets (Taken 04/10/2023 2017)  Pain at adequate level as identified by patient:   Identify patient comfort function goal   Assess for risk of opioid induced respiratory depression, including snoring/sleep apnea. Alert healthcare team of risk factors identified.   Assess pain on admission, during daily assessment and/or before any as needed intervention(s)   Reassess pain within 30-60 minutes of any procedure/intervention, per Pain Assessment, Intervention, Reassessment (AIR) Cycle   Evaluate if patient comfort function goal is met   Evaluate patient's satisfaction with pain management progress   Offer non-pharmacological pain management interventions   Consult/collaborate with Pain Service     Problem: Side Effects from Pain Analgesia  Goal: Patient will experience minimal side effects of analgesic therapy  Outcome: Progressing  Flowsheets (Taken 04/10/2023 2017)  Patient will experience minimal side effects of analgesic therapy:   Monitor/assess patient's respiratory status (RR depth, effort, breath sounds)   Assess for changes in cognitive function   Prevent/manage side effects per LIP orders (i.e. nausea, vomiting, pruritus, constipation, urinary retention,  etc.)   Evaluate for opioid-induced sedation with appropriate assessment tool (i.e. POSS)     Problem: Compromised Sensory Perception  Goal: Sensory Perception Interventions  Outcome: Progressing  Flowsheets (Taken 04/10/2023 0845)  Sensory Perception Interventions: Offload heels, Pad bony prominences, Reposition q 2hrs/turn Clock, Q2 hour skin assessment under devices if present     Problem: Altered GI Function  Goal: Elimination patterns are normal or improving  Outcome: Progressing  Flowsheets (Taken 04/10/2023 2017)  Elimination patterns are normal or improving: Assess for and discuss C. diff screening with LIP

## 2023-04-10 NOTE — Progress Notes (Signed)
 NUTRITION SCREEN  Reason for Assessment: MST 3    Intervention/Plan:  Add PO diet when medically feasible, and as tolerated.  Encourage small frequent meals that are bland, cold/room temp.  Check weight at least weekly standing to monitor trend.    Will order Ensure Plus HP and vary flavors, per pt request.  Please offer between meals when pt consumes <50% of meals.    Goals: To meet >60% of nutrition goal with PO at meals and oral supplements or nutrition support by next RD intervention.    Clinical / Nutrition Assessment: Valerie May is a(n) 53 y.o. female with asthma, eosinophilia mild bronchiectasis, recurrent pneumonia, GERD, diabetes 2, polycythemia, obesity, prior acute hypercarbic respiratory failure presented with nausea, vomiting and abdominal pain.She was recently admitted to the hospital on 2/22 with nausea vomiting, abdominal pain and altered mental status.  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  Pathology report was indeterminate but suspicious for neuroendocrine neoplasia and oncology recommended to repeat biopsy but she left the hospital AMA.    NPO today for EUS with GI.  Pt reported that over the past month she has been eating <50-75% of her normal intake d/t N/V/abd pain.  She likes the Ensure Plus HP any flavor, but does not like the Ensure Clear.  C/o weakness, dry mouth.    Orders Placed This Encounter   Procedures    Diet NPO time specified Except for: SIPS OF WATER  ONLY WITH MEDS       Patient Lines/Drains/Airways Status       Active PICC Line / CVC Line / PIV Line / Drain / Airway / Intraosseous Line / Epidural Line / ART Line / Line / Wound / Pressure Ulcer / NG/OG Tube       Name Placement date Placement time Site Days    Peripheral IV 04/09/23 20 G Diffusion Left Antecubital 04/09/23  0128  Antecubital  1    Peripheral IV 04/09/23 22 G Diffusion Left Hand 04/09/23  0119  Hand  1                     Pertinent Labs: glu 108    Current Facility-Administered Medications   Medication Dose Route Frequency    [Transfer Hold] budesonide -formoterol   2 puff Inhalation BID    [Transfer Hold] cetirizine   10 mg Oral Daily    [Transfer Hold] insulin  lispro  1-5 Units Subcutaneous TID AC    [Transfer Hold] montelukast   10 mg Oral QHS    [Transfer Hold] pantoprazole   40 mg Intravenous Daily    [Transfer Hold] senna-docusate  1 tablet Oral QHS    sugammadex  sodium        [Transfer Hold] umeclidinium bromide   1 puff Inhalation QAM    [Transfer Hold] venlafaxine   25 mg Oral TID MEALS    [Transfer Hold] vitamin D  (ergocalciferol )  50,000 Units Oral Weekly      sodium chloride  Stopped (04/10/23 1255)       Anthropometrics:  Height: 149.9 cm (4' 11)  Weight: 77.1 kg (170 lb)  Body mass index is 34.34 kg/m.  IBW:42-56 kg    %Weight Change: Pt reported that that her UBW 179 lbs (81.4 kg) and she has lost ~10 lbs in the past month.  This would indicate a 5.3% loss x 1 month.   Weight Monitoring     Weight Weight Method   12/09/2022  79.379 kg     03/21/2023 80.287 kg  Stated     80.287 kg     03/22/2023 80.287 kg  Stated     84 kg  Bed Scale    03/23/2023 84 kg  Bed Scale    03/28/2023 84.6 kg     03/29/2023 79.606 kg  Standing Scale    03/30/2023 80.196 kg  Standing Scale    03/31/2023 80.4 kg  Standing Scale    04/01/2023 80.1 kg     04/02/2023 80.4 kg  Standing Scale    04/03/2023 79.516 kg  Standing Scale    04/06/2023 79.5 kg  Standing Scale    04/07/2023 76.658 kg  Standing Scale    04/08/2023 77.111 kg  Stated    04/09/2023 77.111 kg  Stated        Nutrition-Focused Physical Findings:   Head: no overt s/s subcutaneous muscle or fat loss  Upper Body: dorsal hand region: slight depression, decrease in muscle tone/resistance (mild muscle loss - interosseous)  Lower Body: posterior calf region: some shape to the bulb, but not well-developed, decrease in muscle tone/resistance (mild muscle loss - gastrocnemius)  Edema: edema: no sign of fluid  accumulation   GI function: Last BM Date: 04/09/23    Estimated Nutrition Needs:   8919-8774 kcals (22-25 kcal/kg IBW)  98 gm pro (2 gm/kg IBW)  Fluids per team    Nutrition Diagnosis:   Moderate malnutrition--related to inadequate protein energy intake  in the setting of acute illness as evidenced by intake <75% of estimated energy requirements for >1 week, >5% weight loss x 1 month.    Monitor/Eval:  Monitoring Evaluation   PO/EN/PN Adequacy PO/Access/Delivery   GI Profile GI fxn/tolerance   Weight/NFPE Trends/Skin/Edema   Labs CMP, mg, phos, LFTs, TG   Meds Food/med interactions     Valerie May, RD, CNSC  Clinical Dietitian Specialist II  # [phone], # [phone] (main RD line)

## 2023-04-11 ENCOUNTER — Inpatient Hospital Stay: Admitting: Radiology

## 2023-04-11 DIAGNOSIS — R110 Nausea: Secondary | ICD-10-CM

## 2023-04-11 LAB — LAB USE ONLY - CBC WITH DIFFERENTIAL
Absolute Basophils: 0.06 10*3/uL (ref 0.00–0.08)
Absolute Eosinophils: 0.18 10*3/uL (ref 0.00–0.44)
Absolute Immature Granulocytes: 0.05 10*3/uL (ref 0.00–0.07)
Absolute Lymphocytes: 2.87 10*3/uL (ref 0.42–3.22)
Absolute Monocytes: 1.05 10*3/uL — ABNORMAL HIGH (ref 0.21–0.85)
Absolute Neutrophils: 6.5 10*3/uL — ABNORMAL HIGH (ref 1.10–6.33)
Absolute nRBC: 0 10*3/uL (ref <=0.00)
Basophils %: 0.6 %
Eosinophils %: 1.7 %
Hematocrit: 40.1 % (ref 34.7–43.7)
Hemoglobin: 13.4 g/dL (ref 11.4–14.8)
Immature Granulocytes %: 0.5 %
Lymphocytes %: 26.8 %
MCH: 29.9 pg (ref 25.1–33.5)
MCHC: 33.4 g/dL (ref 31.5–35.8)
MCV: 89.5 fL (ref 78.0–96.0)
MPV: 10 fL (ref 8.9–12.5)
Monocytes %: 9.8 %
Neutrophils %: 60.6 %
Platelet Count: 334 10*3/uL (ref 142–346)
Preliminary Absolute Neutrophil Count: 6.5 10*3/uL — ABNORMAL HIGH (ref 1.10–6.33)
RBC: 4.48 10*6/uL (ref 3.90–5.10)
RDW: 12 % (ref 11–15)
WBC: 10.71 10*3/uL — ABNORMAL HIGH (ref 3.10–9.50)
nRBC %: 0 /100{WBCs} (ref <=0.0)

## 2023-04-11 LAB — COMPREHENSIVE METABOLIC PANEL
ALT: 65 U/L — ABNORMAL HIGH (ref <=55)
AST (SGOT): 43 U/L — ABNORMAL HIGH (ref <=41)
Albumin/Globulin Ratio: 1.2 (ref 0.9–2.2)
Albumin: 3.7 g/dL (ref 3.5–5.0)
Alkaline Phosphatase: 122 U/L — ABNORMAL HIGH (ref 37–117)
Anion Gap: 7 (ref 5.0–15.0)
BUN: 10 mg/dL (ref 7–21)
Bilirubin, Total: 0.4 mg/dL (ref 0.2–1.2)
CO2: 26 meq/L (ref 17–29)
Calcium: 8.7 mg/dL (ref 8.5–10.5)
Chloride: 104 meq/L (ref 99–111)
Creatinine: 0.7 mg/dL (ref 0.4–1.0)
GFR: >60 mL/min/{1.73_m2} (ref >=60.0)
Globulin: 3.2 g/dL (ref 2.0–3.6)
Glucose: 125 mg/dL — ABNORMAL HIGH (ref 70–100)
Potassium: 3.4 meq/L — ABNORMAL LOW (ref 3.5–5.3)
Protein, Total: 6.9 g/dL (ref 6.0–8.3)
Sodium: 137 meq/L (ref 135–145)

## 2023-04-11 LAB — LACTIC ACID
Whole Blood Lactic Acid: 2.6 mmol/L — ABNORMAL HIGH (ref 0.2–2.0)
Whole Blood Lactic Acid: 2.5 mmol/L — ABNORMAL HIGH (ref 0.2–2.0)

## 2023-04-11 LAB — WHOLE BLOOD GLUCOSE POCT
Whole Blood Glucose POCT: 123 mg/dL — ABNORMAL HIGH (ref 70–100)
Whole Blood Glucose POCT: 141 mg/dL — ABNORMAL HIGH (ref 70–100)
Whole Blood Glucose POCT: 140 mg/dL — ABNORMAL HIGH (ref 70–100)

## 2023-04-11 LAB — LIPASE: Lipase: 28 U/L (ref 8–78)

## 2023-04-11 MED ORDER — POLYETHYLENE GLYCOL 3350 17 G PO PACK
17.0000 g | PACK | Freq: Every day | ORAL | Status: DC
Start: 2023-04-11 — End: 2023-04-12
  Administered 2023-04-11 – 2023-04-12 (×2): 17 g via ORAL
  Filled 2023-04-11 (×2): qty 1

## 2023-04-11 MED ORDER — SODIUM CHLORIDE 0.9 % IV SOLN
INTRAVENOUS | Status: DC
Start: 2023-04-11 — End: 2023-04-12

## 2023-04-11 MED ORDER — SODIUM CHLORIDE 0.9 % IV BOLUS
1000.0000 mL | Freq: Once | INTRAVENOUS | Status: AC
Start: 2023-04-11 — End: 2023-04-11
  Administered 2023-04-11: 1000 mL via INTRAVENOUS

## 2023-04-11 NOTE — Consults (Signed)
 SURGICAL CONSULTATION  Team 5: Surgical Oncology, [facility]  [id number]    Date Time: 04/11/23 10:26 AM  Patient Name: [facility] D  Attending Physician: Karyle Salvo, MD  Consulting Physician:  Jama Lenis, MD  Reason for consultation: Pancreatic head mass    History of Present Illness:   MALAAK STACH is a 53 y.o. female who presented to the ED on 2/22 for acute worsening of nausea and vomiting x 2 months with SOB.  In the ED, she was tachycardic and hypoxic with leukocytosis (WBC 18).  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  She was found to have acute respiratory acidosis, which was felt to be related to narcotic use in the setting of untreated OSA, possibly in the setting of underlying chronic lung disease/asthma following COVID PNA in the past.  She was admitted to the medicine team and improved with nasal canula O2 and fluids.  During her prior admission, she was seen by GI for her nausea and vomiting and incidental pancreatic head mass.  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  Of note, an incidental right renal cyst was noted and was assessed as benign by the radiologist.  She underwent an EGD and EUS on 2/26 where esophageal candidiasis was noted and her pancreatic head mass was biopsied. She was started on antifungals and her nausea/vomiting improved as she advanced to a regular diet. She remained admitted due to multifocal pain complaints requiring palliative care consult to assist with a pain regimen.  Her FNA then resulted as insufficient but suspicious for acinar cell carcinoma, NET or solid pseudopapillary neoplasm. She was seen by medical oncology and underwent an attempted repeat EUS with biopsy on 3/7 but procedure was aborted due to food residue. EGD was complicated by an aspiration event for which she  remained intubated. Patient underwent repeat EGD and biopsy yesterday.    Patient denies any emesis, endorses mild nausea and abdominal pain. Endorses diarrhea prior to recent admission though feels slightly more constipated now. Tolerating diet.    Path is pending.    Past Medical History:     Past Medical History:   Diagnosis Date    COVID-19     COVID-19 2021    Heart valve problem     Kidney stone     Pneumonia        Past Surgical History:   Past Surgical History[1]    Family History:   Family History[2]    Social History:   @IPSOCHX @    Allergies:   Allergies[3]    Medications:     Prior to Admission medications    Medication Sig Start Date End Date Taking? Authorizing Provider   acetaminophen  (TYLENOL ) 650 MG CR tablet TAKE 1 TABLET BY MOUTH EVERY 8 HOURS AS NEEDED FOR PAIN  Patient taking differently: every 8 (eight) hours as needed 09/02/21  Yes Leontine Clark, MD   albuterol  sulfate HFA (PROVENTIL ) 108 (90 Base) MCG/ACT inhaler Inhale 1-2 puffs into the lungs every 4 (four) hours as needed 10/06/20  Yes [provider]   benzonatate  (TESSALON ) 200 MG capsule Take 1 capsule (200 mg) by mouth 3 (three) times daily as needed for Cough   Yes [provider]   Budeson-Glycopyrrol-Formoterol  160-9-4.8 MCG/ACT Aerosol Inhale 2 puffs into the lungs 2 (two) times daily 12/09/22  Yes  Wilfrid Oneil PARAS, MD   budesonide -formoterol  (SYMBICORT ) 160-4.5 MCG/ACT inhaler Inhale 2 puffs into the lungs 2 (two) times daily as needed   Yes [provider]   budesonide -formoterol  (SYMBICORT ) 160-4.5 MCG/ACT inhaler Inhale 2 puffs into the lungs 2 (two) times daily 04/07/23  Yes Tahir, Arooj, MD   cetirizine  (ZyrTEC ) 10 MG tablet Take 1 tablet (10 mg) by mouth daily   Yes [provider]   diphenhydrAMINE -APAP, sleep, (Excedrin PM) 38-500 MG Tab Take by mouth daily   Yes [provider]   montelukast  (SINGULAIR ) 10 MG tablet Take 1 tablet (10 mg) by mouth nightly 11/07/22  Yes [provider]   pantoprazole  (PROTONIX ) 40 MG tablet TAKE 1 TABLET BY MOUTH EVERY DAY  Patient taking differently: as needed 12/25/20  Yes Leontine Clark, MD   umeclidinium bromide  (INCRUSE ELLIPTA ) 62.5 MCG/ACT Aerosol Pwdr, Breath Activated Inhale 1 puff into the lungs daily 04/07/23  Yes Tahir, Arooj, MD   vitamin D , ergocalciferol , (DRISDOL ) 50000 UNIT Cap Take 1 capsule (50,000 Units total) by mouth once a week 11/14/20  Yes Nguyen, Quang, MD   metFORMIN (GLUCOPHAGE) 500 MG tablet Take 1 tablet (500 mg) by mouth 2 (two) times daily with meals    [provider]   venlafaxine  (EFFEXOR ) 25 MG tablet Take 1 tablet (25 mg) by mouth 3 (three) times daily with meals for 15 days 04/08/23 04/23/23  Kristi Areas, MD       Review of Systems:   Negative except as described in HPI.      Physical Exam:     Vitals:    04/11/23 0801   BP: 104/72   Pulse: 88   Resp: 18   Temp: 97.7 F (36.5 C)   SpO2: 93%       PEEP/EPAP:  [0 cm H20-4 cm H20] 0 cm H20    Intake and Output Summary (Last 24 hours) at Date Time    Intake/Output Summary (Last 24 hours) at 04/11/2023 1026  Last data filed at 04/11/2023 0751  Gross per 24 hour   Intake 250 ml   Output 1430 ml   Net -1180 ml       Physical Exam:  Constitutional: Well-appearing  Head: Atraumatic, normocephalic  Eyes: Anicteric  Ears: Normal external ears  Nose: Normal nose  Neck: No visible JVD  Chest: Normal work of breathing, normal chest rise  Cardiovascular: Regular rate  Abdomen: Soft, minimally tender LUQ  Perineum: No abscess  Neuro/Pysch: Alert, oriented and grossly non-focal. Appropriate affect  Extremities: Normal ROM      Labs:     Results       Procedure Component Value Units Date/Time    Whole Blood Glucose POCT [[id number]]  (Abnormal) Collected: 04/11/23 0758    Specimen: Blood, Capillary Updated: 04/11/23 0859     Whole Blood Glucose POCT 123 mg/dL     Culture, Blood, Aerobic And Anaerobic [[id number]] Collected: 04/08/23 2327    Specimen: Blood, Venous Updated:  04/11/23 0600     Culture Blood No growth at 2 days    Whole Blood Glucose POCT [[id number]]  (Normal) Collected: 04/10/23 1252    Specimen: Blood, Capillary Updated: 04/10/23 1302     Whole Blood Glucose POCT 98 mg/dL             Rads:     Radiology Results (24 Hour)       Procedure Component Value Units Date/Time    XR Abdomen Portable [[id number]] Resulted: 04/11/23 0943  Order Status: Sent Updated: 04/11/23 0943            Problem List:   Problem List[4]    Assessment:   53 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      Patient has a BMI of 34.34 kg/m2    Diagnosis: Class 1 Obesity: BMI of 30 to 34.9     Laboratory Evidence of Acidosis:  Recent Labs   Lab 04/10/23  0459 04/08/23  2327 04/07/23  0439 04/05/23  0337 04/04/23  1020   Venous pH  --   --   --   --  7.378   Venous HCO3  --   --   --   --  33.9*   CO2 21 25 29   More results in Results Review  --    Venous PCO2  --   --   --   --  58.6   More results in Results Review = values in this interval not displayed.        Acidosis Diagnosis: No additional diagnosis       Plan:   - No signs of metastasis on recent CTA chest  - Followup FNA path results  - Followup with Dr. Elinor in clinic as outpatient     Signed by: Earleen CHRISTELLA Blanch, MD    Addendum    I have seen and examined the patient in conjunction with the resident team. I agree with the resident findings, interpretation of data, and management plan as stated above.    No acute intervention needed, patient to follow up with Dr. Elinor in the office.     Alm PATRIC Ruth, MD, [facility]  Surgical Oncology/Hepatico-Pancreato-Biliary Surgery  Assistant Professor of Surgery, [facility] [place]   [facility]  [address]  [place], [place]  [address]    P [phone]  F [phone]           [1]   Past Surgical History:  Procedure Laterality Date    CYST REMOVAL      ESOPHAGOGASTRODUODENOSCOPY (EGD), ESOPHAGEAL ULTRASOUND (EUS) WITH FINE NEEDLE ASPIRATION/BIOPSY OF  ESOPHAGUS, STOMACH, OR DUODENUM N/A 03/25/2023    Procedure: ESOPHAGOGASTRODUODENOSCOPY (EGD), ESOPHAGEAL ULTRASOUND (EUS) WITH FINE NEEDLE ASPIRATION/BIOPSY OF ESOPHAGUS, STOMACH, OR DUODENUM;  Surgeon: Abagail Carole CHRISTELLA, MD;  Location: [facility];  Service: Gastroenterology;  Laterality: N/A;    ESOPHAGOGASTRODUODENOSCOPY (EGD), ESOPHAGEAL ULTRASOUND (EUS) WITH FINE NEEDLE ASPIRATION/BIOPSY OF ESOPHAGUS, STOMACH, OR DUODENUM N/A 04/03/2023    Procedure: ESOPHAGOGASTRODUODENOSCOPY (EGD), ESOPHAGEAL ULTRASOUND (EUS) WITH FINE NEEDLE ASPIRATION/BIOPSY OF ESOPHAGUS, STOMACH, OR DUODENUM;  Surgeon: Ulysess Foots, MD;  Location: [facility];  Service: Gastroenterology;  Laterality: N/A;    GALLBLADDER SURGERY      HERNIA REPAIR      HYSTERECTOMY      TUBAL LIGATION     [2]   Family History  Problem Relation Name Age of Onset    Heart attack Mother      Diabetes Father      Heart attack Maternal Grandfather      Heart attack Paternal Grandfather      Stroke Son      Heart attack Maternal Uncle      Stomach cancer Cousin      Bone cancer Maternal Aunt      Colon cancer Neg Hx      Breast cancer Neg Hx     [3]   Allergies  Allergen Reactions    Benadryl  [Diphenhydramine ]  Dust Mite Extract     Methocarbamol Itching    Molds & Smuts     Other     Compazine [Prochlorperazine] Anxiety   [4]   Patient Active Problem List  Diagnosis    GERD (gastroesophageal reflux disease)    Hypoxemia    Intractable chronic migraine without aura    Lumbar spondylosis    Obstructive sleep apnea (adult) (pediatric)    Unspecified astigmatism, bilateral    Uterine leiomyoma    Polycythemia    Vitamin D  deficiency    Ischemic cerebrovascular accident (CVA) (CMS/HCC)    Severe persistent asthma without complication (CMS/HCC)    Eosinophilia    Bronchiectasis without complication (CMS/HCC)    Personal history of COVID-19    Personal history of pneumonia (recurrent)    Abnormal chest CT    Chronic cough    Generalized abdominal pain    Pancreatic  mass    Nausea & vomiting    Pancreatic lesion    Intractable abdominal pain

## 2023-04-11 NOTE — Plan of Care (Signed)
 No acute changes overnight.    Problem: Moderate/High Fall Risk Score >5  Goal: Patient will remain free of falls  Outcome: Progressing  Moderate Risk (6-13):   LOW-Fall Interventions Appropriate for Low Fall Risk   MOD-Consider activation of bed alarm if appropriate   MOD-Floor mat at bedside (where available) if appropriate   MOD-Perform dangle, stand, walk (DSW) prior to mobilization   LOW-Anticoagulation education for injury risk     Problem: Pain interferes with ability to perform ADL  Goal: Pain at adequate level as identified by patient  Outcome: Progressing  Pain at adequate level as identified by patient:   Identify patient comfort function goal   Assess for risk of opioid induced respiratory depression, including snoring/sleep apnea. Alert healthcare team of risk factors identified.   Assess pain on admission, during daily assessment and/or before any as needed intervention(s)   Reassess pain within 30-60 minutes of any procedure/intervention, per Pain Assessment, Intervention, Reassessment (AIR) Cycle   Evaluate if patient comfort function goal is met   Evaluate patient's satisfaction with pain management progress   Offer non-pharmacological pain management interventions   Consult/collaborate with Pain Service     Problem: Side Effects from Pain Analgesia  Goal: Patient will experience minimal side effects of analgesic therapy  Outcome: Progressing  Patient will experience minimal side effects of analgesic therapy:   Monitor/assess patient's respiratory status (RR depth, effort, breath sounds)   Assess for changes in cognitive function   Prevent/manage side effects per LIP orders (i.e. nausea, vomiting, pruritus, constipation, urinary retention, etc.)   Evaluate for opioid-induced sedation with appropriate assessment tool (i.e. POSS)     Problem: Altered GI Function  Goal: Fluid and electrolyte balance are achieved/maintained  Outcome: Progressing  Fluid and electrolyte balance are  achieved/maintained:   Monitor/assess lab values and report abnormal values   Assess and reassess fluid and electrolyte status

## 2023-04-11 NOTE — Progress Notes (Addendum)
 MEDICINE PROGRESS NOTE    Date Time: 04/11/23 10:14 AM  Patient Name: [facility]  Attending Physician: Karyle Salvo, MD    Assessment / Plan:        53 year old female with past medical history of asthma, eosinophilia mild bronchiectasis, recurrent pneumonia, GERD, diabetes 2, polycythemia, obesity, prior acute hypercarbic respiratory failure presented with nausea, vomiting and abdominal pain.She was recently admitted to the hospital on 2/22 with nausea vomiting, abdominal pain and altered mental status.  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     #Intractable nausea, vomiting  #Diarrhea  #Abdominal pain  #Heartburn  #Pancreatic head lesion  Lipase, lactic acid within normal limit  CT abdomen pelvis showed minimal fluid adjacent to pancreatic head. ? intralesional hemorrhage postbiopsy  Status post EUS 04/10/2023, more samples was taken from the known pancreatic head mass, FNA  Follow-up cytology  Complain of abdominal discomfort, check lipase and lactic acid  Oncology and surgical oncology consulted  KUB ordered  Elevated lactic acid, IV fluid bolus  IV fluids started  Trend lactic acid  Labs ordered  Follow-up stool studies  IV Zofran , recent QTc 447  Status post IV fluid  Respiratory panel negative  Continue supportive care  Hold off on IV antibiotic for now           #Diabetes 2  Last A1c 6  Monitor blood glucose  Insulin  sliding scale     #GERD  PPI     #Asthma  #Obesity  Continue Symbicort , Singulair , albuterol  as needed  Given recent acute hypercarbic respiratory failure plan to avoid sedating meds  Will need sleep study      #Thrombocytosis  Likely reactive    Will monitor        DVT prophylaxis: Currently on hold       CODE STATUS full  code      Case discussed with: RN and patient's friend on phone with patient permission    Additional Diagnoses:   Malnutrition Documentation    Moderate malnutrition--related to inadequate protein energy intake  in the setting of acute illness as evidenced by intake <75% of estimated energy requirements for >1 week, >5% weight loss x 1 month.                Safety Checklist:     DVT prophylaxis:  CHEST guideline (See page [id number]) No   Foley:  [place] Rn Foley protocol Not present   IVs:  Peripheral IV   PT/OT: Not needed   Daily CBC & or Chem ordered:  SHM/ABIM guidelines (see #5) Yes, due to clinical and lab instability   Reference for approximate charges of common labs: CBC auto diff - $76  BMP - $99  Mg - $79    Lines:     Patient Lines/Drains/Airways Status       Active PICC Line / CVC Line / PIV Line / Drain / Airway / Intraosseous Line / Epidural Line / ART Line / Line / Wound / Pressure Ulcer / NG/OG Tube       Name Placement date Placement time Site Days    Peripheral IV 04/09/23 20 G Diffusion Left Antecubital 04/09/23  0128  Antecubital  1    Peripheral IV 04/09/23 22 G Diffusion Left Hand 04/09/23  0119  Hand  1    ETT  7 mm 04/10/23  1502  -- less than 1                     Disposition: (Please see PAF column for Expected D/C Date)   Today's date: 04/11/2023  Admit Date: 04/08/2023 10:41 PM  LOS: 2         Subjective     CC: Intractable abdominal pain    Interval History/24 hour events: EUS yesterday    HPI/Subjective: Bowel movement in the morning.  Still feeling nauseous.  No vomiting.  Tolerating diet.  Complain of generalized weakness and fatigue.  Mild epigastric discomfort.  Denies chest pain shortness of breath or urinary symptoms.     Review of Systems:     As per HPI    Physical Exam:     VITAL SIGNS PHYSICAL EXAM   Temp:  [97 F (36.1 C)-98.2 F (36.8 C)] 97.7 F (36.5 C)  Heart Rate:  [85-105] 88  Resp Rate:  [14-27] 18  BP: (102-127)/(58-76) 104/72  Blood Glucose:           Intake/Output  Summary (Last 24 hours) at 04/11/2023 1014  Last data filed at 04/11/2023 0751  Gross per 24 hour   Intake 250 ml   Output 1430 ml   Net -1180 ml    Physical Exam  General: awake, alert   Cardiovascular: regular rate and rhythm, no murmurs, rubs or gallops  Lungs: clear to auscultation bilaterally, without wheezing, rhonchi, or rales  Abdomen: soft, mild epigastric tenderness, no guarding no rebound,   normoactive bowel sounds  Extremities: no edema         Meds:     Medications were reviewed:  Current Facility-Administered Medications   Medication Dose Route Frequency    budesonide -formoterol   2 puff Inhalation BID    cetirizine   10 mg Oral Daily    insulin  lispro  1-5 Units Subcutaneous TID AC    montelukast   10 mg Oral QHS    pantoprazole   40 mg Intravenous Daily    senna-docusate  1 tablet Oral QHS    umeclidinium bromide   1 puff Inhalation QAM    venlafaxine   25 mg Oral TID MEALS    vitamin D  (ergocalciferol )  50,000 Units Oral Weekly     Infusion Meds[1]  PRN Medications[2]      Labs:     Labs (last 72 hours):    Recent Labs   Lab 04/10/23  0459 04/08/23  2327   WBC 6.90 8.89   Hemoglobin 13.6 14.4   Hematocrit 41.7 42.1   Platelet Count 360* 398*       Recent Labs   Lab 04/10/23  0459 04/07/23  0616   PT 13.2* 12.9   INR 1.2 1.1   PTT 33  --     Recent Labs   Lab 04/10/23  0459 04/08/23  2327   Sodium 141 141   Potassium 3.6 4.0   Chloride 110 107   CO2 21 25   BUN 10 15   Creatinine 0.7 0.6   Calcium  9.0 10.2   Albumin 3.7 4.2   Protein, Total 6.7 7.0   Bilirubin, Total 0.6 0.5   Alkaline Phosphatase 113 138*   ALT 44 54   AST (SGOT) 33 30   Glucose 108* 118*                   Microbiology, reviewed  and are significant for:  Microbiology Results (last 15 days)       Procedure Component Value Units Date/Time    Stool Enteric Bacteria Panel - Salmonella, Shigella, Campylobacter And Shiga Toxin, PCR [[id number]] Collected: 04/09/23 1654    Order Status: Canceled Specimen: Stool Updated: 04/09/23 1733    Stool  Cryptosporidium parvum and Giardia lamblia, Antigen [[id number]]  (Normal) Collected: 04/09/23 1654    Order Status: Completed Specimen: Stool Updated: 04/10/23 0426     Stool Cryptosporidium parvum antigen Negative     Stool Giardia lamblia antigen Negative    Respiratory Pathogen Panel With COVID-19, PCR [[id number]]  (Normal) Collected: 04/09/23 1653    Order Status: Completed Specimen: Nasopharyngeal Swab from Nasopharynx Updated: 04/10/23 0509     Adenovirus DNA Not Detected     Coronavirus HKU1 RNA Not Detected     Coronavirus NL63 RNA Not Detected     Coronavirus 229E RNA Not Detected     Coronavirus OC43 RNA Not Detected     Human Metapneumovirus RNA Not Detected     Human Rhinovirus/Enterovirus RNA Not Detected     Influenza A RNA Not Detected     Influenza B RNA Not Detected     Parainfluenza Virus 1 RNA Not Detected     Parainfluenza Virus 2 RNA Not Detected     Parainfluenza Virus 3 RNA Not Detected     Parainfluenza Virus 4 RNA Not Detected     Respiratory Syncytial Virus RNA Not Detected     Bordetella pertussis DNA Not Detected     Bordetella parapertussis DNA Not Detected     Chlamydophila pneumoniae DNA Not Detected     Mycoplasma pneumoniae DNA Not Detected     SARS-CoV-2 (COVID-19) RNA Not Detected    Narrative:      Testing performed using the [employer] FilmArray Respiratory Panel (RP 2.1).  This test is for the qualitative detection of respiratory pathogen nucleic acid. This assay cannot differentiate between Rhinovirus/Enterovirus. If necessary for patient care, a positive result for Rhinovirus/Enterovirus may be followed-up using an alternate method. This assay should not be used if B. pertussis infection is specifically suspected as it is less sensitive than other alternatives. If suspected, ensure that B. pertussis specific testing is ordered. Recent administration of a nasal influenza vaccine may cause false positive results for Influenza A and/or Influenza B.    Viral and bacterial nucleic  acids may persist even though no viable organism is present. Detection of nucleic acid does not imply that the corresponding organisms are infectious or are the causative agents of clinical symptoms. Positive results of this test do not rule out coinfection with other organisms. A negative result does not exclude the possibility of viral or bacterial infection.  Assay performance characteristics may vary with circulating strains and this assay may not be able to distinguish between existing viral strains and new variants as they emerge. Results of this test should not be used as the sole basis for diagnosis, treatment, or other patient management decisions.    This assay is FDA authorized for nasopharyngeal swab samples.  Performance characteristics for Bronchoalveolar lavage samples have been determined by the [facility] laboratory. Other sample types are unacceptable.     Invalid results may be due to inhibiting substances in the specimen and recollection should occur.       Stool Clostridioides difficile Toxin B, PCR [[id number]]     Order Status: Sent Specimen: Stool     Culture, Urine [[id number]] Collected: 04/09/23 0058  Order Status: Completed Specimen: Urine, Clean Catch Updated: 04/10/23 1011    Narrative:      1,000-9,000 CFU/mL of multiple bacterial morphotypes present.  Possible contamination, appropriate recollection is requested if clinically indicated.    Culture, Blood, Aerobic And Anaerobic [[id number]] Collected: 04/08/23 2327    Order Status: Completed Specimen: Blood, Venous Updated: 04/11/23 0600     Culture Blood No growth at 2 days    Nares, MRSA (methicillin-resistant Staphylococcus aureus) Screening, PCR [[id number]]  (Normal) Collected: 04/03/23 1314    Order Status: Completed Specimen: Swab from Nares Updated: 04/03/23 1444     MRSA (methicillin resistant Staphylococcus aureus) DNA Not Detected     Comment: Testing performed with the Xpert MRSA NxG assay. This test is intended for the  detection of methicillin-resistant Staphylococcus aureus (MRSA) DNA. This test is not intended to diagnose, guide, or monitor treatment for MRSA infections, or provide results of susceptibility to methicillin. A positive test result does not necessarily indicate the presence of viable organisms. A negative result does not preclude MRSA nasal colonization.       Respiratory Pathogen Panel With COVID-19, PCR [[id number]]  (Normal) Collected: 04/02/23 1017    Order Status: Completed Specimen: Nasopharyngeal Swab from Nasopharynx Updated: 04/02/23 1855     Adenovirus DNA Not Detected     Coronavirus HKU1 RNA Not Detected     Coronavirus NL63 RNA Not Detected     Coronavirus 229E RNA Not Detected     Coronavirus OC43 RNA Not Detected     Human Metapneumovirus RNA Not Detected     Human Rhinovirus/Enterovirus RNA Not Detected     Influenza A RNA Not Detected     Influenza B RNA Not Detected     Parainfluenza Virus 1 RNA Not Detected     Parainfluenza Virus 2 RNA Not Detected     Parainfluenza Virus 3 RNA Not Detected     Parainfluenza Virus 4 RNA Not Detected     Respiratory Syncytial Virus RNA Not Detected     Bordetella pertussis DNA Not Detected     Bordetella parapertussis DNA Not Detected     Chlamydophila pneumoniae DNA Not Detected     Mycoplasma pneumoniae DNA Not Detected     SARS-CoV-2 (COVID-19) RNA Not Detected    Narrative:      Testing performed using the [employer] FilmArray Respiratory Panel (RP 2.1).  This test is for the qualitative detection of respiratory pathogen nucleic acid. This assay cannot differentiate between Rhinovirus/Enterovirus. If necessary for patient care, a positive result for Rhinovirus/Enterovirus may be followed-up using an alternate method. This assay should not be used if B. pertussis infection is specifically suspected as it is less sensitive than other alternatives. If suspected, ensure that B. pertussis specific testing is ordered. Recent administration of a nasal influenza vaccine  may cause false positive results for Influenza A and/or Influenza B.    Viral and bacterial nucleic acids may persist even though no viable organism is present. Detection of nucleic acid does not imply that the corresponding organisms are infectious or are the causative agents of clinical symptoms. Positive results of this test do not rule out coinfection with other organisms. A negative result does not exclude the possibility of viral or bacterial infection.  Assay performance characteristics may vary with circulating strains and this assay may not be able to distinguish between existing viral strains and new variants as they emerge. Results of this test should not be used as the sole basis for diagnosis,  treatment, or other patient management decisions.    This assay is FDA authorized for nasopharyngeal swab samples.  Performance characteristics for Bronchoalveolar lavage samples have been determined by the [facility] laboratory. Other sample types are unacceptable.     Invalid results may be due to inhibiting substances in the specimen and recollection should occur.       Culture, Sputum and Lower Respiratory [[id number]] Collected: 04/02/23 0847    Order Status: Completed Specimen: Sputum, Expectorated Updated: 04/04/23 0705     Culture Respiratory Light growth of Mixed upper respiratory flora     Gram Stain Many WBCs      Few Squamous epithelial cells      Many Mixed respiratory flora    Narrative:      No Pseudomonas aeruginosa, No Staphylococcus aureus, No Methicillin Resistant Staphylococcus aureus (MRSA).            Imaging and labs reviewed         Signed by: Wayna Majors, MD                [1]   Current Facility-Administered Medications   Medication Dose Route Frequency Last Rate   [2]   Current Facility-Administered Medications   Medication Dose Route    acetaminophen   650 mg Oral    albuterol   2.5 mg Nebulization    albuterol -ipratropium  3 mL Nebulization    benzocaine -menthol   1 lozenge Buccal     benzonatate   200 mg Oral    carboxymethylcellulose sodium  1 drop Both Eyes    dextrose   15 g of glucose Oral    Or    dextrose   12.5 g Intravenous    Or    dextrose   12.5 g Intravenous    Or    glucagon  (rDNA)  1 mg Intramuscular    dextrose   15 g of glucose Oral    Or    dextrose   12.5 g Intravenous    Or    dextrose   12.5 g Intravenous    Or    glucagon  (rDNA)  1 mg Intramuscular    HYDROmorphone   0.4 mg Intravenous    magnesium  sulfate  1 g Intravenous    melatonin  3 mg Oral    naloxone   0.2 mg Intravenous    ondansetron   4 mg Oral    Or    ondansetron   4 mg Intravenous    oxyCODONE   10 mg Oral    oxyCODONE   5 mg Oral    potassium & sodium phosphates   2 packet Oral    potassium chloride   0-60 mEq Oral    Or    potassium chloride   0-60 mEq Oral    Or    potassium chloride   10 mEq Intravenous    saline  2 spray Each Nare

## 2023-04-11 NOTE — Consults (Signed)
 Inpatient Solid Tumor Team  Secure Chat: [facility] *preferred contact*  Spectra  [phone]  After hours call [phone]      INPATIENT ONCOLOGY CONSULTATION    Date Time: 04/11/23 2:41 PM  Patient Name: [name]  Requesting Physician: Karyle Salvo, MD  Attending Oncologist:     Reason for Consultation:     Pancreatic head tumor     Assessment:     Valerie May is a 53 year old woman presenting with  recurrent  nausea, vomiting and abdominal pain, found to have small pancreatic head lesion proven malignant by FNA, however with insufficient tissue for precise diagnosis. Patient has since had repeat biopsy on 3/14. The tumor is fortunately small (1.2 x 1.0 cm on initial MRI, but now 1.6 x 1.2 cm on most recent CT scan) and by MRI has no vascular involvement.     *Celiac Axis: No tumoral interface.  *SMA:  No tumoral interface.  *SMV:  No tumoral interface.  *Portal Vein:  No tumoral interface.  *Other:  No tumoral interface.  *Notable vascular anatomic variation: None.    Additionally, there is no evidence of distant metastatic diease on CT chest/abdomen/pelvis. 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 These findings were discussed with the patient and it was explained that she would likely need surgery, possibly Whipple procedure, though to be determined and final planning will depend on establishing final diagnosis and interdisciplinary discussion with the surgical teams and medical oncology. It was explained that her tumor may be responsible for some of her ongoing symptoms.     Plan:     #Pancreatic head malignant neoplasm  -f/u FNA 3/14 results   -Recommend surgical consultation.  -Will arrange outpatient follow up with medical oncology   -Pain and nausea control per primary team      Attending addendum:    I performed a history and physical examination of the patient and discussed the patient's management with the fellow. I  reviewed the fellow's note and agree with the documented findings and plan of care.    Valerie May is a 53 year old woman with several months of nausea, vomiting, and upper abdominal pain radiating to the back. She came to the hospital for these symptoms and was found to have a mass in the head of the pancreas with no vascular involvement or metastatic disease.   She has had a biopsy which revealed malignancy, but could not characterize the tumor further (acinar cell, neuroendocrine, vs solid pseudopapillary). As the surgical and perioperative management of these diagnoses would differ, she was recommended for another biopsy which took place yesterday.   Plan for medical management of nausea and pain. Once her nausea and pain are controlled would plan for discharge home and outpatient follow up in multi-d clinic to discuss the results of her biopsy, and plan further care. She will be contacted by the new patient coordinators to arrange follow up.  Vernel Ruth, MD  GI Oncology    History:   Valerie May is a 53 year old woman with PMHx of asthma, eosinophilia mild bronchiectasis, recurrent pneumonia, GERD, diabetes 2, polycythemia, obesity, hx cholecystectomy, prior acute hypercarbic respiratory failure, who initially presented to the hospital on on 2/22 with nausea vomiting, abdominal pain and altered mental status and was incidentally found to have pancreatic head mass on imaging which was biopsied on 2/26 after EUS/EGD. She left against medical advise and has since been re-admitted with recurrent  nausea, vomiting and abdominal pain. Results of initial FNA have resulted as positive for malignancy but with insufficient tissue to create cell block for ancillary testing for concrete diagnosis, thus differential includes acinar cell carcinoma, neuroendocrine neoplasm and solid pseudopapillary neoplasm.     The patient states that she has a cousin back in [place] native country who died of gastric cancer secondary to  H.pylori. No known history of pancreatic cancer. She currently states that she is experiencing abdominal pain as well as ongoing nausea. No fevers, no chills, +/- constipation vs diarrhea, not clear, however she states that her stools were loose after undergoing prep for EGD.     Past Medical History:     Past Medical History:   Diagnosis Date    COVID-19     COVID-19 2021    Heart valve problem     Kidney stone     Pneumonia        Past Surgical History:   Past Surgical History[1]    Family History:   Family History[2]    Social History:   @IPSOCHX @    Allergies:   Allergies[3]    Medications:     Current Facility-Administered Medications   Medication Dose Route Frequency    budesonide -formoterol   2 puff Inhalation BID    cetirizine   10 mg Oral Daily    insulin  lispro  1-5 Units Subcutaneous TID AC    montelukast   10 mg Oral QHS    pantoprazole   40 mg Intravenous Daily    polyethylene glycol  17 g Oral Daily    senna-docusate  1 tablet Oral QHS    sodium chloride   1,000 mL Intravenous Once    umeclidinium bromide   1 puff Inhalation QAM    venlafaxine   25 mg Oral TID MEALS    vitamin D  (ergocalciferol )  50,000 Units Oral Weekly       Physical Exam:     Vitals:    04/11/23 1222   BP: (!) 144/87   Pulse: (!) 102   Resp: 15   Temp: 98.1 F (36.7 C)   SpO2: 96%       Intake and Output Summary (Last 24 hours) at Date Time    Intake/Output Summary (Last 24 hours) at 04/11/2023 1441  Last data filed at 04/11/2023 0751  Gross per 24 hour   Intake 250 ml   Output 1430 ml   Net -1180 ml       General appearance: Well appearing, NAD  Mental status: AOX4  Eyes: EOMI, non-icteric  Neck: FROM  Chest: No increased work of breathing, no cough   Heart: No cyanosis or pallor, no peripheral edema  Abdomen: Obese, soft, non-distended  Extremities: FROM, no tenderness    Skin: Normal skin tone, no rashes, no lesions  Neurologic: No focal neurologic deficits.       Labs Reviewed:     Results       Procedure Component Value Units Date/Time     Lipase [[id number]]  (Normal) Collected: 04/11/23 1258    Specimen: Blood, Venous Updated: 04/11/23 1336     Lipase 28 U/L     Lactic Acid [[id number]]  (Abnormal) Collected: 04/11/23 1258    Specimen: Blood, Venous Updated: 04/11/23 1313     Whole Blood Lactic Acid 2.6 mmol/L     Whole Blood Glucose POCT [[id number]]  (Abnormal) Collected: 04/11/23 1225    Specimen: Blood, Capillary Updated: 04/11/23 1229     Whole Blood Glucose POCT 141 mg/dL  Whole Blood Glucose POCT [[id number]]  (Abnormal) Collected: 04/11/23 0758    Specimen: Blood, Capillary Updated: 04/11/23 0859     Whole Blood Glucose POCT 123 mg/dL     Culture, Blood, Aerobic And Anaerobic [[id number]] Collected: 04/08/23 2327    Specimen: Blood, Venous Updated: 04/11/23 0600     Culture Blood No growth at 2 days            Pathology:     Fine Needle Aspirate [[id number]] (Order [id number]) --04/10/23 pending    Fine Needle Aspirate: [id number] 03/25/2023           Component  Ref Range & Units  Resulting Agency   Final Diagnosis      PANCREATIC HEAD MASS, ENDOSCOPIC ULTRASOUND GUIDED FINE NEEDLE ASPIRATION: POSITIVE FOR NEOPLASM; SEE COMMENT      Electronically signed by Christopher Bucco, MD on 04/01/2023 at 1126    Comment  [facility]   The pancreas head mass aspirate is cellular and consistent with neoplasm, with a differential diagnosis of acinar cell carcinoma, neuroendocrine neoplasm and solid pseudopapillary neoplasm. Unfortunately, all the cellularity is in the smears made on-site and there is nothing abnormal in the cell block for staining to further characterize tumor type.  Repeat cellblock from remnant supernate is also acellular.    This case is peer-reviewed intradepartmentally, with concurrence.     The preliminary findings were communicated to Dr. Abagail on 03/30/2023 at 9:20 am.    Gross Description  [facility]   A. Pancreas, Head.  pancreatic head mass FNB  Volume 25 mL  Clarity Hazy  Color Red  Cyto Process:  1 Cell Block(s) and 4 Diff  Quik(s)  Received In: Formalin     Processing Cyto-Prep Tech: Masooda Osmanzada-Samady    Disclaimer  ICL   When stains are performed, all control slides are reviewed and found to stain appropriately.  Some stains and controls used in arriving at the diagnosis (if any) were prepared at [facility] and/or an outside reference laboratory using reagents that have not been cleared or approved by the U.S. Food and Drug Administration.  These were developed, and their performance characteristics determined by [facility] and/or an outside reference laboratory. Their use does not require FDA approval.     Please note that some or all of this record was generated using voice recognition software. If there are any questions about the content of this document, please contact the author as some errors in transcription may have occurred.   Diagnosis Interpretation   Atypical            Rads:   Radiological Procedure reviewed.     CT Angio Chest (PE or trauma protocol) [[id number]] (Order [id number])  Status: Final result     Study Result    Narrative & Impression   HISTORY: Chest pain and tachycardia. Recent ICU stay and intubation.     COMPARISON: 03/21/2023     TECHNIQUE: CTA chest WITH intravenous contrast. PE protocol. 3D MIP images  are submitted and reviewed. The following dose reduction techniques were  utilized: Automated exposure control and/or adjustment of the mA and/or kV  according to patient size, and the use of iterative reconstruction  technique.     CONTRAST: iohexol  (OMNIPAQUE ) 350 MG/ML injection 100 mL.      FINDINGS:      LINES/TUBES: None.     LUNGS: Stable scarring with traction bronchiectasis in the bilateral upper  lobes and right middle lobe. No consolidation, edema  or mass.  PLEURA: No pleural effusions or pneumothorax.     HEART: Not enlarged.  MEDIASTINUM: No axillary, hilar or mediastinal lymphadenopathy.  PULMONARY ARTERIES: No acute pulmonary emboli.  AORTA: No aneurysm or  dissection.     UPPER ABDOMEN: Please see separate CT of the abdomen and pelvis report.     BONES AND SOFT TISSUES: No suspicious or destructive osseous lesion.     IMPRESSION:      No acute pulmonary embolism.     Garnette Rouse, MD  04/09/2023 3:34 AM     CT Abd/ Pelvis with IV Contrast [[id number]] (Order [id number])  Status: Final result     Study Result    Narrative & Impression   HISTORY: Recent biopsy of a pancreatic head mass. Severe diffuse abdominal  pain.      COMPARISON: MRI abdomen 03/24/2023; CT abdomen pelvis 03/21/2023     TECHNIQUE: CT abdomen and pelvis WITH intravenous contrast. The following  dose reduction techniques were utilized: Automated exposure control and/or  adjustment of the mA and/or kV according to patient size, and the use of  iterative reconstruction technique.     CONTRAST: iohexol  (OMNIPAQUE ) 350 MG/ML injection 100 mL.      FINDINGS:      LOWER THORAX: The lung bases are clear.     LIVER/BILIARY TREE: Postcholecystectomy. No focal liver lesion.  SPLEEN: No splenomegaly.  PANCREAS: Slightly hyperattenuating lesion in the pancreatic head measures  1.6 x 1.2 cm. This previously measured up to 1.2 cm in dimension on prior  MRI. There is minimal adjacent fat stranding/fluid. No ductal dilation.     KIDNEYS/URETERS: The kidneys enhance symmetrically. There are small  nonobstructing bilateral renal calculi. Small right renal cyst previously  characterized by MRI. No hydronephrosis.  ADRENALS: No adrenal mass.  PELVIC ORGANS/BLADDER: The bladder is nondistended.     PERITONEUM/RETROPERITONEUM: No free air or fluid.  LYMPH NODES: No lymphadenopathy.  VESSELS: No aortic aneurysm.     GI TRACT: No bowel wall thickening or obstruction.  There is diverticulosis  without evidence of acute diverticulitis. The appendix looks normal.     BONES AND SOFT TISSUES: No suspicious or destructive osseous lesion.     IMPRESSION:      1.Minimal fat stranding/fluid adjacent to the pancreatic head may  represent  postbiopsy changes. Recommend correlation with lipase to exclude acute  pancreatitis.  2.Slightly increased size of the pancreatic head lesion, potentially  reflecting intralesional hemorrhage post biopsy.     Garnette Rouse, MD  04/09/2023 3:24 AM       MRI Abdomen W WO Contrast MRCP [[id number]] (Order [id number])  Status: Final result     Study Result    Narrative & Impression   HISTORY: Recurrent lesion.     COMPARISON: CT abdomen pelvis, 03/21/2023, 06/18/2007.     TECHNIQUE: Multiplanar MR imaging of the abdomen utilizing T1 and T2  weighted sequences including postcontrast imaging. MRCP-specific sequences  with 3-D reconstructions were also performed.     CONTRAST: Clariscan  16 cc     FINDINGS:   Exam quality degraded by artifact arising from motion. The following  observations are made within these confines.     Lung bases: No suspicious focal abnormality, where seen.     Liver: Diffuse hepatic steatosis by chemical shift artifact. Smooth hepatic  contours. No lobar redistribution or fissural widening. No suspicious focal  liver lesion.     Gallbladder / biliary tree: Status post cholecystectomy. Mild prominence of  the biliary  system with common duct tapering smoothly towards the ampulla,  likely physiologic in the absence of the gallbladder.     Pancreas: Small, solid-appearing lesion of the pancreatic head,  corresponding to the recently discussed CT finding. Characteristic follow.     Pancreatic tumor:  *Location: Pancreatic head  *Size: 1.2 x 1.0 cm, (1203:73)   *Enhancement relative to pancreas: Faintly hyperenhancing in the portal  venous and delayed phases.  *Confined to pancreas with fat plane: Yes.  *Biliary involvement: No.  *Remaining pancreas: Normal.     Pancreatic tumoral vascular involvement:  *Celiac Axis: No tumoral interface.  *SMA:  No tumoral interface.  *SMV:  No tumoral interface.  *Portal Vein:  No tumoral interface.  *Other:  No tumoral interface.  *Notable vascular anatomic  variation: None.     Spleen: Normal.     Adrenal glands: Normal.     Kidneys / ureters: Benign nonenhancing bilateral renal cysts, including a  mid right renal 1.7 cm cyst that recently demonstrated intermediate density  by CT (3:36). No hydronephrosis or suspicious renal cortical lesion.     Gastrointestinal: Colonic diverticulosis. No bowel obstruction or area  suspicious for focal bowel inflammation, where seen.     Abdominal vessels: Normal morphology and patency of the abdominal aorta.  The portal, superior mesenteric, and splenic veins are patent.     Lymph nodes: Normal.     Peritoneum / retroperitoneum: Normal.     Bones and other soft tissues: No suspicious focal osseous lesion.        IMPRESSION:      Exam quality degraded by artifact arising from motion. The following  observations are made within these confines.      1.Pancreatic head 1.2 cm enhancing lesion, suspected neoplasm. This lesion  appears confined of the pancreas and demonstrates no major vascular  interfaces. Recommend EUS for further evaluation.  2.No findings suspicious for metastatic disease within the imaged abdomen.  3.Mid right renal 1.7 cm cyst, recently demonstrating intermediate density  by CT and is benign.  4.Hepatic steatosis.  5.Colonic diverticulosis.     Elwood Miyamoto, MD  03/24/2023 8:49 AM         Signed by: Aleene Shape, MD        [facility]  [address]   [place] [address]  [phone]          [1]   Past Surgical History:  Procedure Laterality Date    CYST REMOVAL      ESOPHAGOGASTRODUODENOSCOPY (EGD), ESOPHAGEAL ULTRASOUND (EUS) WITH FINE NEEDLE ASPIRATION/BIOPSY OF ESOPHAGUS, STOMACH, OR DUODENUM N/A 03/25/2023    Procedure: ESOPHAGOGASTRODUODENOSCOPY (EGD), ESOPHAGEAL ULTRASOUND (EUS) WITH FINE NEEDLE ASPIRATION/BIOPSY OF ESOPHAGUS, STOMACH, OR DUODENUM;  Surgeon: Abagail Carole HERO, MD;  Location: [facility];  Service: Gastroenterology;  Laterality: N/A;    ESOPHAGOGASTRODUODENOSCOPY (EGD),  ESOPHAGEAL ULTRASOUND (EUS) WITH FINE NEEDLE ASPIRATION/BIOPSY OF ESOPHAGUS, STOMACH, OR DUODENUM N/A 04/03/2023    Procedure: ESOPHAGOGASTRODUODENOSCOPY (EGD), ESOPHAGEAL ULTRASOUND (EUS) WITH FINE NEEDLE ASPIRATION/BIOPSY OF ESOPHAGUS, STOMACH, OR DUODENUM;  Surgeon: Ulysess Foots, MD;  Location: [facility];  Service: Gastroenterology;  Laterality: N/A;    GALLBLADDER SURGERY      HERNIA REPAIR      HYSTERECTOMY      TUBAL LIGATION     [2]   Family History  Problem Relation Name Age of Onset    Heart attack Mother      Diabetes Father      Heart attack Maternal Grandfather  Heart attack Paternal Grandfather      Stroke Son      Heart attack Maternal Uncle      Stomach cancer Cousin      Bone cancer Maternal Aunt      Colon cancer Neg Hx      Breast cancer Neg Hx     [3]   Allergies  Allergen Reactions    Benadryl  [Diphenhydramine ]     Dust Mite Extract     Methocarbamol Itching    Molds & Smuts     Other     Compazine [Prochlorperazine] Anxiety

## 2023-04-11 NOTE — Plan of Care (Addendum)
 Pt is AOX4. Denies CP and SOB. PRN pain meds given twice. IV Zofran  given twice for Nausea. Bolus NS given and on continuous NS afterward. XR of abdomen done. 1 BM this shift and still constipated. Call bell belongings within reach. Safety precaution maintained throughout the shift.     Problem: Moderate/High Fall Risk Score >5  Goal: Patient will remain free of falls  Outcome: Progressing  Flowsheets (Taken 04/10/2023 2017)  Moderate Risk (6-13):   LOW-Fall Interventions Appropriate for Low Fall Risk   MOD-Consider activation of bed alarm if appropriate   MOD-Floor mat at bedside (where available) if appropriate   MOD-Perform dangle, stand, walk (DSW) prior to mobilization   LOW-Anticoagulation education for injury risk     Problem: Pain interferes with ability to perform ADL  Goal: Pain at adequate level as identified by patient  Outcome: Progressing  Flowsheets (Taken 04/11/2023 1144)  Pain at adequate level as identified by patient:   Identify patient comfort function goal   Assess for risk of opioid induced respiratory depression, including snoring/sleep apnea. Alert healthcare team of risk factors identified.   Assess pain on admission, during daily assessment and/or before any as needed intervention(s)   Reassess pain within 30-60 minutes of any procedure/intervention, per Pain Assessment, Intervention, Reassessment (AIR) Cycle   Evaluate if patient comfort function goal is met   Evaluate patient's satisfaction with pain management progress   Offer non-pharmacological pain management interventions     Problem: Side Effects from Pain Analgesia  Goal: Patient will experience minimal side effects of analgesic therapy  Outcome: Progressing  Flowsheets (Taken 04/11/2023 1144)  Patient will experience minimal side effects of analgesic therapy:   Monitor/assess patient's respiratory status (RR depth, effort, breath sounds)   Assess for changes in cognitive function   Prevent/manage side effects per LIP orders (i.e.  nausea, vomiting, pruritus, constipation, urinary retention, etc.)   Evaluate for opioid-induced sedation with appropriate assessment tool (i.e. POSS)     Problem: Altered GI Function  Goal: Fluid and electrolyte balance are achieved/maintained  Outcome: Progressing  Flowsheets (Taken 04/11/2023 1144)  Fluid and electrolyte balance are achieved/maintained:   Monitor/assess lab values and report abnormal values   Assess and reassess fluid and electrolyte status   Observe for cardiac arrhythmias   Monitor for muscle weakness  Goal: Elimination patterns are normal or improving  Outcome: Progressing  Flowsheets (Taken 04/11/2023 1144)  Elimination patterns are normal or improving: Assess for and discuss C. diff screening with LIP

## 2023-04-12 LAB — COMPREHENSIVE METABOLIC PANEL
ALT: 57 U/L — ABNORMAL HIGH (ref <=55)
AST (SGOT): 37 U/L (ref <=41)
Albumin/Globulin Ratio: 1.2 (ref 0.9–2.2)
Albumin: 3.7 g/dL (ref 3.5–5.0)
Alkaline Phosphatase: 108 U/L (ref 37–117)
Anion Gap: 6 (ref 5.0–15.0)
BUN: 6 mg/dL — ABNORMAL LOW (ref 7–21)
Bilirubin, Total: 0.4 mg/dL (ref 0.2–1.2)
CO2: 24 meq/L (ref 17–29)
Calcium: 9 mg/dL (ref 8.5–10.5)
Chloride: 104 meq/L (ref 99–111)
Creatinine: 0.6 mg/dL (ref 0.4–1.0)
GFR: >60 mL/min/{1.73_m2} (ref >=60.0)
Globulin: 3.1 g/dL (ref 2.0–3.6)
Glucose: 94 mg/dL (ref 70–100)
Potassium: 4 meq/L (ref 3.5–5.3)
Protein, Total: 6.8 g/dL (ref 6.0–8.3)
Sodium: 134 meq/L — ABNORMAL LOW (ref 135–145)

## 2023-04-12 LAB — CBC
Absolute nRBC: 0 10*3/uL (ref <=0.00)
Hematocrit: 40 % (ref 34.7–43.7)
Hemoglobin: 13.2 g/dL (ref 11.4–14.8)
MCH: 29.6 pg (ref 25.1–33.5)
MCHC: 33 g/dL (ref 31.5–35.8)
MCV: 89.7 fL (ref 78.0–96.0)
MPV: 10 fL (ref 8.9–12.5)
Platelet Count: 349 10*3/uL — ABNORMAL HIGH (ref 142–346)
RBC: 4.46 10*6/uL (ref 3.90–5.10)
RDW: 12 % (ref 11–15)
WBC: 7.55 10*3/uL (ref 3.10–9.50)
nRBC %: 0 /100{WBCs} (ref <=0.0)

## 2023-04-12 LAB — PHOSPHORUS: Phosphorus: 3.5 mg/dL (ref 2.3–4.7)

## 2023-04-12 LAB — LACTIC ACID
Whole Blood Lactic Acid: 1 mmol/L (ref 0.2–2.0)
Whole Blood Lactic Acid: 2.4 mmol/L — ABNORMAL HIGH (ref 0.2–2.0)

## 2023-04-12 LAB — WHOLE BLOOD GLUCOSE POCT
Whole Blood Glucose POCT: 104 mg/dL — ABNORMAL HIGH (ref 70–100)
Whole Blood Glucose POCT: 84 mg/dL (ref 70–100)

## 2023-04-12 LAB — MAGNESIUM: Magnesium: 2.1 mg/dL (ref 1.6–2.6)

## 2023-04-12 MED ORDER — POLYETHYLENE GLYCOL 3350 17 G PO PACK
17.0000 g | PACK | Freq: Two times a day (BID) | ORAL | Status: AC
Start: 2023-04-12 — End: ?
  Administered 2023-04-12 – 2023-04-16 (×8): 17 g via ORAL
  Filled 2023-04-12 (×8): qty 1

## 2023-04-12 NOTE — Plan of Care (Signed)
 Nursing Progress Note - Shift Summary and Care Plan    Shift Summary:   Patient lost two IV's today because they fell out according to the patient, catheter intact in both. Both technician and this RN tried to get a new IV and both infiltrated. Patient had multiple episodes of nausea and pain-controlled w/ oxycodone  and zofran .   Visit Vitals  BP 134/72   Pulse 96   Temp 97.9 F (36.6 C) (Oral)   Resp 16   Ht 1.499 m (4' 11)   Wt 77.1 kg (170 lb)   LMP 03/18/2012   SpO2 95%   BMI 34.34 kg/m        Critical Events:   None    LDAs & Indication:  Patient Lines/Drains/Airways Status       Active Lines, Drains and Airways       None                     Systems Overview:  Neuro:   A&Ox4  Resp:   On RA  Cardiac:   No tele, RRR  GI/GU:   Last BM Date: 04/11/23   0.3 mL/kg/hr (24hr)   Nutrition:   Tolerating Reg, GI soft diet.   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    Discharge Plan:  No d/c plan at this time    Care Plan    Problem: Moderate/High Fall Risk Score >5  Goal: Patient will remain free of falls  Outcome: Progressing  Flowsheets (Taken 04/12/2023 0424)  Moderate Risk (6-13): LOW-Fall Interventions Appropriate for Low Fall Risk     Problem: Pain interferes with ability to perform ADL  Goal: Pain at adequate level as identified by patient  Outcome: Progressing  Flowsheets (Taken 04/11/2023 1944)  Pain at adequate level as identified by patient:   Identify patient comfort function goal   Assess for risk of opioid induced respiratory depression, including snoring/sleep apnea. Alert healthcare team of risk factors identified.   Assess pain on admission, during daily assessment and/or before any as needed intervention(s)   Reassess pain within 30-60 minutes of  any procedure/intervention, per Pain Assessment, Intervention, Reassessment (AIR) Cycle   Evaluate if patient comfort function goal is met   Evaluate patient's satisfaction with pain management progress   Offer non-pharmacological pain management interventions     Problem: Side Effects from Pain Analgesia  Goal: Patient will experience minimal side effects of analgesic therapy  Outcome: Progressing  Flowsheets (Taken 04/11/2023 1944)  Patient will experience minimal side effects of analgesic therapy:   Monitor/assess patient's respiratory status (RR depth, effort, breath sounds)   Assess for changes in cognitive function   Prevent/manage side effects per LIP orders (i.e. nausea, vomiting, pruritus, constipation, urinary retention, etc.)   Evaluate for opioid-induced sedation with appropriate assessment tool (i.e. POSS)     Problem: Compromised Sensory Perception  Goal: Sensory Perception Interventions  Outcome: Progressing  Flowsheets (Taken 04/11/2023 1944)  Sensory Perception Interventions: Offload heels, Pad bony prominences, Reposition q 2hrs/turn Clock, Q2 hour skin assessment under devices if present     Problem: Compromised Moisture  Goal: Moisture level Interventions  Outcome: Progressing  Flowsheets (Taken 04/11/2023 1944)  Moisture level Interventions: Moisture wicking  products, Moisture barrier cream     Problem: Compromised Activity/Mobility  Goal: Activity/Mobility Interventions  Outcome: Progressing  Flowsheets (Taken 04/11/2023 1944)  Activity/Mobility Interventions: Pad bony prominences, TAP Seated positioning system when OOB, Promote PMP, Reposition q 2 hrs / turn clock, Offload heels     Problem: Compromised Nutrition  Goal: Nutrition Interventions  Outcome: Progressing  Flowsheets (Taken 04/11/2023 1944)  Nutrition Interventions: Discuss nutrition at Rounds, I&Os, Document % meal eaten, Daily weights     Problem: Compromised Friction/Shear  Goal: Friction and Shear Interventions  Outcome:  Progressing  Flowsheets (Taken 04/11/2023 1944)  Friction and Shear Interventions: Pad bony prominences, Off load heels, HOB 30 degrees or less unless contraindicated, Consider: TAP seated positioning, Heel foams     Problem: Altered GI Function  Goal: Fluid and electrolyte balance are achieved/maintained  Outcome: Progressing  Flowsheets (Taken 04/12/2023 0424)  Fluid and electrolyte balance are achieved/maintained:   Monitor/assess lab values and report abnormal values   Assess and reassess fluid and electrolyte status   Monitor for muscle weakness   Observe for cardiac arrhythmias  Goal: Elimination patterns are normal or improving  Outcome: Progressing  Flowsheets (Taken 04/11/2023 1144 by Shirleyann Forge, RN)  Elimination patterns are normal or improving: Assess for and discuss C. diff screening with LIP

## 2023-04-12 NOTE — Plan of Care (Signed)
 Nursing Progress Note - Shift Summary and Care Plan    Shift Summary:   New IV placed this shift.   Visit Vitals  BP 119/77   Pulse 91   Temp 98.1 F (36.7 C) (Oral)   Resp 14   Ht 1.499 m (4' 11)   Wt 77.1 kg (170 lb)   LMP 03/18/2012   SpO2 92%   BMI 34.34 kg/m        Critical Events:   N/A  LDAs & Indication:  Patient Lines/Drains/Airways Status       Active Lines, Drains and Airways       Name Placement date Placement time Site Days    Peripheral IV 04/12/23 22 G Standard Left Hand 04/12/23  1006  Hand  less than 1                   Systems Overview:  Neuro:   A&Ox4  Resp:   On RA  Cardiac:   Reg HR, No telemetry, patient denies chest pain  GI/GU:   Last BM Date: 04/11/23   0.5 mL/kg/hr (24hr)   Pt on bowel regime   Nutrition:   Tolerating reg diet.   +N/-V (zofran  providing relief)  ACHS  Activity/Safety/Mobility:   Independent  Low fall risk- safety precautions in place: bed in lowest position, fall mat in place, call bell within reach.  Skin/Wounds:    Scattered scars/bruising/tattoos      Treatments/Wound Care  VTE Prophylaxis:   None ordered    Upcoming Tests/Procedures  N/A    Discharge Plan:  Data Unavailable   Planning to discharge home       Care Plan    Problem: Moderate/High Fall Risk Score >5  Goal: Patient will remain free of falls  Outcome: Progressing     Problem: Compromised Moisture  Goal: Moisture level Interventions  Outcome: Progressing     Problem: Compromised Activity/Mobility  Goal: Activity/Mobility Interventions  Outcome: Progressing     Problem: Compromised Nutrition  Goal: Nutrition Interventions  Outcome: Progressing

## 2023-04-12 NOTE — Plan of Care (Addendum)
 Nursing Progress Note - Shift Summary and Care Plan    Shift Summary:   No significant events throughout shift. X1 Zofran  for nausea and x1 10 mg oxycodone  given for pain.     Visit Vitals  BP (!) 135/108   Pulse (!) 101   Temp 98.1 F (36.7 C) (Oral)   Resp 15   Ht 1.499 m (4' 11)   Wt 77.1 kg (170 lb)   LMP 03/18/2012   SpO2 93%   BMI 34.34 kg/m        Critical Events:   N/A    LDAs & Indication:  Patient Lines/Drains/Airways Status       Active Lines, Drains and Airways       Name Placement date Placement time Site Days    Peripheral IV 04/12/23 22 G Standard Left Hand 04/12/23  1006  Hand  less than 1                     Systems Overview:  Neuro:   A&Ox4 (any changes?)  Resp:   On RA  Cardiac:   No Tele. No complaints of CP or SOB  GI/GU:   Last BM Date: 04/11/23   0.5 mL/kg/hr (24hr)   Nutrition:   Tolerating reg/ high fiber diet.   +N/- V - x1 IV zofran   Activity/Safety/Mobility:   Patient is independent in room - calls when needs assistance  Skin/Wounds:      VTE Prophylaxis:     Upcoming Tests/Procedures  N/A    Discharge Plan:  Data Unavailable   Planning to discharge home?    Care Plan      Problem: Moderate/High Fall Risk Score >5  Goal: Patient will remain free of falls  Flowsheets (Taken 04/12/2023 2353)  Moderate Risk (6-13):   LOW-Fall Interventions Appropriate for Low Fall Risk   LOW-Anticoagulation education for injury risk     Problem: Pain interferes with ability to perform ADL  Goal: Pain at adequate level as identified by patient  Flowsheets (Taken 04/11/2023 1944 by Erla Drone, RN)  Pain at adequate level as identified by patient:   Identify patient comfort function goal   Assess for risk of opioid induced respiratory depression, including snoring/sleep apnea. Alert healthcare team of risk factors identified.   Assess pain on admission, during daily assessment and/or before any as needed intervention(s)   Reassess pain within 30-60 minutes of any procedure/intervention, per Pain Assessment,  Intervention, Reassessment (AIR) Cycle   Evaluate if patient comfort function goal is met   Evaluate patient's satisfaction with pain management progress   Offer non-pharmacological pain management interventions     Problem: Side Effects from Pain Analgesia  Goal: Patient will experience minimal side effects of analgesic therapy  Flowsheets (Taken 04/11/2023 1944 by Erla Drone, RN)  Patient will experience minimal side effects of analgesic therapy:   Monitor/assess patient's respiratory status (RR depth, effort, breath sounds)   Assess for changes in cognitive function   Prevent/manage side effects per LIP orders (i.e. nausea, vomiting, pruritus, constipation, urinary retention, etc.)   Evaluate for opioid-induced sedation with appropriate assessment tool (i.e. POSS)     Problem: Altered GI Function  Goal: Fluid and electrolyte balance are achieved/maintained  Flowsheets (Taken 04/12/2023 0424 by Erla Drone, RN)  Fluid and electrolyte balance are achieved/maintained:   Monitor/assess lab values and report abnormal values   Assess and reassess fluid and electrolyte status   Monitor for muscle weakness   Observe for cardiac arrhythmias

## 2023-04-12 NOTE — Progress Notes (Addendum)
 MEDICINE PROGRESS NOTE    Date Time: 04/12/23 2:10 PM  Patient Name: [facility]  Attending Physician: Karyle Salvo, MD    Assessment / Plan:        53 year old female with past medical history of asthma, eosinophilia mild bronchiectasis, recurrent pneumonia, GERD, diabetes 2, polycythemia, obesity, prior acute hypercarbic respiratory failure presented with nausea, vomiting and abdominal pain.She was recently admitted to the hospital on 2/22 with nausea vomiting, abdominal pain and altered mental status.  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     #Intractable nausea, vomiting  #Diarrhea  #Abdominal pain  #Heartburn  #Pancreatic head lesion  CT abdomen pelvis showed minimal fluid adjacent to pancreatic head. ? intralesional hemorrhage postbiopsy  Status post EUS 04/10/2023, more samples was taken from the known pancreatic head mass, FNA  Follow-up cytology  Ongoing complain of abdominal discomfort, improving  Lactic acid elevated, status post IV fluid bolus and IV fluid  Lactic acidosis resolved, [place] [place] IV fluid today  Appreciate on oncology and surgical oncology recommendations and assistance  Leukocytosis resolved  Feels constipated  IV Zofran , recent QTc 447  Respiratory panel negative  Continue supportive care  Hold off on IV antibiotic for now  Monitor signs and symptoms off of the IV fluid.  Anticipate discharge tomorrow if tolerate diet and no vomiting       #Constipation  Bowel protocol       #Diabetes 2  Last A1c 6  Monitor blood glucose  Insulin  sliding scale     #GERD  PPI     #Asthma  #Obesity  Continue Symbicort , Singulair , albuterol  as needed  Given recent acute hypercarbic respiratory failure plan to avoid sedating meds  Will need sleep  study      #Thrombocytosis  Likely reactive    Will monitor        DVT prophylaxis: Currently on hold       CODE STATUS full code      Case discussed with: Patient, RN    Additional Diagnoses:   Malnutrition Documentation    Moderate malnutrition--related to inadequate protein energy intake  in the setting of acute illness as evidenced by intake <75% of estimated energy requirements for >1 week, >5% weight loss x 1 month.                Safety Checklist:     DVT prophylaxis:  CHEST guideline (See page [id number]) No   Foley:  [place] Rn Foley protocol Not present   IVs:  Peripheral IV   PT/OT: Not needed   Daily CBC & or Chem ordered:  SHM/ABIM guidelines (see #5) Yes, due to clinical and lab instability   Reference for approximate charges of common labs: CBC auto diff - $76  BMP - $99  Mg - $79    Lines:     Patient Lines/Drains/Airways Status       Active PICC Line / CVC Line / PIV Line / Drain / Airway / Intraosseous Line / Epidural Line / ART Line / Line / Wound / Pressure Ulcer / NG/OG Tube       Name Placement date Placement time Site Days    Peripheral IV 04/09/23 20 G Diffusion Left Antecubital 04/09/23  0128  Antecubital  1  Peripheral IV 04/09/23 22 G Diffusion Left Hand 04/09/23  0119  Hand  1    ETT  7 mm 04/10/23  1502  -- less than 1                     Disposition: (Please see PAF column for Expected D/C Date)   Today's date: 04/12/2023  Admit Date: 04/08/2023 10:41 PM  LOS: 3         Subjective     CC: Intractable abdominal pain    Interval History/24 hour events: Elevated lactic acid  HPI/Subjective: Feels constipated.  Intermittent nausea.  Denies vomiting.  Complain of epigastric discomfort.  No chest pain, shortness of breath or urinary symptoms.  Feels fatigue.  Poor oral intake and diet.         Review of Systems:     As per HPI    Physical Exam:     VITAL SIGNS PHYSICAL EXAM   Temp:  [97.7 F (36.5 C)-98.1 F (36.7 C)] 98.1 F (36.7 C)  Heart Rate:  [86-97] 96  Resp Rate:  [12-16] 15  BP:  (111-143)/(68-86) 128/86  Blood Glucose:           Intake/Output Summary (Last 24 hours) at 04/12/2023 1410  Last data filed at 04/12/2023 1317  Gross per 24 hour   Intake 3276.25 ml   Output 900 ml   Net 2376.25 ml    Physical Exam  General: awake, alert   Cardiovascular: regular rate and rhythm, no murmurs, rubs or gallops  Lungs: clear to auscultation bilaterally, without wheezing, rhonchi, or rales  Abdomen: soft, mild epigastric tenderness, no guarding no rebound,   normoactive bowel sounds  Extremities: no edema         Meds:     Medications were reviewed:  Current Facility-Administered Medications   Medication Dose Route Frequency    budesonide -formoterol   2 puff Inhalation BID    cetirizine   10 mg Oral Daily    insulin  lispro  1-5 Units Subcutaneous TID AC    montelukast   10 mg Oral QHS    pantoprazole   40 mg Intravenous Daily    polyethylene glycol  17 g Oral BID    senna-docusate  1 tablet Oral QHS    umeclidinium bromide   1 puff Inhalation QAM    venlafaxine   25 mg Oral TID MEALS    vitamin D  (ergocalciferol )  50,000 Units Oral Weekly     Infusion Meds[1]  PRN Medications[2]      Labs:     Labs (last 72 hours):    Recent Labs   Lab 04/12/23  0906 04/11/23  1730   WBC 7.55 10.71*   Hemoglobin 13.2 13.4   Hematocrit 40.0 40.1   Platelet Count 349* 334       Recent Labs   Lab 04/10/23  0459 04/07/23  0616   PT 13.2* 12.9   INR 1.2 1.1   PTT 33  --     Recent Labs   Lab 04/12/23  0906 04/11/23  1730   Sodium 134* 137   Potassium 4.0 3.4*   Chloride 104 104   CO2 24 26   BUN 6* 10   Creatinine 0.6 0.7   Calcium  9.0 8.7   Albumin 3.7 3.7   Protein, Total 6.8 6.9   Bilirubin, Total 0.4 0.4   Alkaline Phosphatase 108 122*   ALT 57* 65*   AST (SGOT) 37 43*   Glucose 94 125*  Microbiology, reviewed and are significant for:  Microbiology Results (last 15 days)       Procedure Component Value Units Date/Time    Stool Enteric Bacteria Panel - Salmonella, Shigella, Campylobacter And Shiga Toxin, PCR  [[id number]] Collected: 04/09/23 1654    Order Status: Canceled Specimen: Stool Updated: 04/09/23 1733    Stool Cryptosporidium parvum and Giardia lamblia, Antigen [[id number]]  (Normal) Collected: 04/09/23 1654    Order Status: Completed Specimen: Stool Updated: 04/10/23 0426     Stool Cryptosporidium parvum antigen Negative     Stool Giardia lamblia antigen Negative    Respiratory Pathogen Panel With COVID-19, PCR [[id number]]  (Normal) Collected: 04/09/23 1653    Order Status: Completed Specimen: Nasopharyngeal Swab from Nasopharynx Updated: 04/10/23 0509     Adenovirus DNA Not Detected     Coronavirus HKU1 RNA Not Detected     Coronavirus NL63 RNA Not Detected     Coronavirus 229E RNA Not Detected     Coronavirus OC43 RNA Not Detected     Human Metapneumovirus RNA Not Detected     Human Rhinovirus/Enterovirus RNA Not Detected     Influenza A RNA Not Detected     Influenza B RNA Not Detected     Parainfluenza Virus 1 RNA Not Detected     Parainfluenza Virus 2 RNA Not Detected     Parainfluenza Virus 3 RNA Not Detected     Parainfluenza Virus 4 RNA Not Detected     Respiratory Syncytial Virus RNA Not Detected     Bordetella pertussis DNA Not Detected     Bordetella parapertussis DNA Not Detected     Chlamydophila pneumoniae DNA Not Detected     Mycoplasma pneumoniae DNA Not Detected     SARS-CoV-2 (COVID-19) RNA Not Detected    Narrative:      Testing performed using the [employer] FilmArray Respiratory Panel (RP 2.1).  This test is for the qualitative detection of respiratory pathogen nucleic acid. This assay cannot differentiate between Rhinovirus/Enterovirus. If necessary for patient care, a positive result for Rhinovirus/Enterovirus may be followed-up using an alternate method. This assay should not be used if B. pertussis infection is specifically suspected as it is less sensitive than other alternatives. If suspected, ensure that B. pertussis specific testing is ordered. Recent administration of a nasal  influenza vaccine may cause false positive results for Influenza A and/or Influenza B.    Viral and bacterial nucleic acids may persist even though no viable organism is present. Detection of nucleic acid does not imply that the corresponding organisms are infectious or are the causative agents of clinical symptoms. Positive results of this test do not rule out coinfection with other organisms. A negative result does not exclude the possibility of viral or bacterial infection.  Assay performance characteristics may vary with circulating strains and this assay may not be able to distinguish between existing viral strains and new variants as they emerge. Results of this test should not be used as the sole basis for diagnosis, treatment, or other patient management decisions.    This assay is FDA authorized for nasopharyngeal swab samples.  Performance characteristics for Bronchoalveolar lavage samples have been determined by the [facility] laboratory. Other sample types are unacceptable.     Invalid results may be due to inhibiting substances in the specimen and recollection should occur.       Stool Clostridioides difficile Toxin B, PCR [[id number]]     Order Status: Canceled Specimen: Stool     Culture, Urine [[id number]] Collected:  04/09/23 0058    Order Status: Completed Specimen: Urine, Clean Catch Updated: 04/10/23 1011    Narrative:      1,000-9,000 CFU/mL of multiple bacterial morphotypes present.  Possible contamination, appropriate recollection is requested if clinically indicated.    Culture, Blood, Aerobic And Anaerobic [[id number]] Collected: 04/08/23 2327    Order Status: Completed Specimen: Blood, Venous Updated: 04/12/23 0600     Culture Blood No growth at 3 days    Nares, MRSA (methicillin-resistant Staphylococcus aureus) Screening, PCR [[id number]]  (Normal) Collected: 04/03/23 1314    Order Status: Completed Specimen: Swab from Nares Updated: 04/03/23 1444     MRSA (methicillin resistant Staphylococcus  aureus) DNA Not Detected     Comment: Testing performed with the Xpert MRSA NxG assay. This test is intended for the detection of methicillin-resistant Staphylococcus aureus (MRSA) DNA. This test is not intended to diagnose, guide, or monitor treatment for MRSA infections, or provide results of susceptibility to methicillin. A positive test result does not necessarily indicate the presence of viable organisms. A negative result does not preclude MRSA nasal colonization.       Respiratory Pathogen Panel With COVID-19, PCR [[id number]]  (Normal) Collected: 04/02/23 1017    Order Status: Completed Specimen: Nasopharyngeal Swab from Nasopharynx Updated: 04/02/23 1855     Adenovirus DNA Not Detected     Coronavirus HKU1 RNA Not Detected     Coronavirus NL63 RNA Not Detected     Coronavirus 229E RNA Not Detected     Coronavirus OC43 RNA Not Detected     Human Metapneumovirus RNA Not Detected     Human Rhinovirus/Enterovirus RNA Not Detected     Influenza A RNA Not Detected     Influenza B RNA Not Detected     Parainfluenza Virus 1 RNA Not Detected     Parainfluenza Virus 2 RNA Not Detected     Parainfluenza Virus 3 RNA Not Detected     Parainfluenza Virus 4 RNA Not Detected     Respiratory Syncytial Virus RNA Not Detected     Bordetella pertussis DNA Not Detected     Bordetella parapertussis DNA Not Detected     Chlamydophila pneumoniae DNA Not Detected     Mycoplasma pneumoniae DNA Not Detected     SARS-CoV-2 (COVID-19) RNA Not Detected    Narrative:      Testing performed using the [employer] FilmArray Respiratory Panel (RP 2.1).  This test is for the qualitative detection of respiratory pathogen nucleic acid. This assay cannot differentiate between Rhinovirus/Enterovirus. If necessary for patient care, a positive result for Rhinovirus/Enterovirus may be followed-up using an alternate method. This assay should not be used if B. pertussis infection is specifically suspected as it is less sensitive than other alternatives.  If suspected, ensure that B. pertussis specific testing is ordered. Recent administration of a nasal influenza vaccine may cause false positive results for Influenza A and/or Influenza B.    Viral and bacterial nucleic acids may persist even though no viable organism is present. Detection of nucleic acid does not imply that the corresponding organisms are infectious or are the causative agents of clinical symptoms. Positive results of this test do not rule out coinfection with other organisms. A negative result does not exclude the possibility of viral or bacterial infection.  Assay performance characteristics may vary with circulating strains and this assay may not be able to distinguish between existing viral strains and new variants as they emerge. Results of this test should not be used as  the sole basis for diagnosis, treatment, or other patient management decisions.    This assay is FDA authorized for nasopharyngeal swab samples.  Performance characteristics for Bronchoalveolar lavage samples have been determined by the [facility] laboratory. Other sample types are unacceptable.     Invalid results may be due to inhibiting substances in the specimen and recollection should occur.       Culture, Sputum and Lower Respiratory [[id number]] Collected: 04/02/23 0847    Order Status: Completed Specimen: Sputum, Expectorated Updated: 04/04/23 0705     Culture Respiratory Light growth of Mixed upper respiratory flora     Gram Stain Many WBCs      Few Squamous epithelial cells      Many Mixed respiratory flora    Narrative:      No Pseudomonas aeruginosa, No Staphylococcus aureus, No Methicillin Resistant Staphylococcus aureus (MRSA).            Imaging and labs reviewed         Signed by: Wayna Majors, MD                [1]   Current Facility-Administered Medications   Medication Dose Route Frequency Last Rate    sodium chloride    Intravenous Continuous 75 mL/hr at 04/11/23 1812   [2]   Current Facility-Administered  Medications   Medication Dose Route    acetaminophen   650 mg Oral    albuterol   2.5 mg Nebulization    albuterol -ipratropium  3 mL Nebulization    benzocaine -menthol   1 lozenge Buccal    benzonatate   200 mg Oral    carboxymethylcellulose sodium  1 drop Both Eyes    dextrose   15 g of glucose Oral    Or    dextrose   12.5 g Intravenous    Or    dextrose   12.5 g Intravenous    Or    glucagon  (rDNA)  1 mg Intramuscular    dextrose   15 g of glucose Oral    Or    dextrose   12.5 g Intravenous    Or    dextrose   12.5 g Intravenous    Or    glucagon  (rDNA)  1 mg Intramuscular    HYDROmorphone   0.4 mg Intravenous    magnesium  sulfate  1 g Intravenous    melatonin  3 mg Oral    naloxone   0.2 mg Intravenous    ondansetron   4 mg Oral    Or    ondansetron   4 mg Intravenous    oxyCODONE   10 mg Oral    oxyCODONE   5 mg Oral    potassium & sodium phosphates   2 packet Oral    potassium chloride   0-60 mEq Oral    Or    potassium chloride   0-60 mEq Oral    Or    potassium chloride   10 mEq Intravenous    saline  2 spray Each Nare

## 2023-04-13 ENCOUNTER — Telehealth (INDEPENDENT_AMBULATORY_CARE_PROVIDER_SITE_OTHER): Payer: Self-pay

## 2023-04-13 ENCOUNTER — Inpatient Hospital Stay

## 2023-04-13 ENCOUNTER — Encounter: Payer: Self-pay | Admitting: Internal Medicine

## 2023-04-13 ENCOUNTER — Inpatient Hospital Stay: Admitting: Radiology

## 2023-04-13 LAB — BASIC METABOLIC PANEL
Anion Gap: 8 (ref 5.0–15.0)
BUN: 8 mg/dL (ref 7–21)
CO2: 25 meq/L (ref 17–29)
Calcium: 9.5 mg/dL (ref 8.5–10.5)
Chloride: 104 meq/L (ref 99–111)
Creatinine: 0.6 mg/dL (ref 0.4–1.0)
GFR: >60 mL/min/{1.73_m2} (ref >=60.0)
Glucose: 110 mg/dL — ABNORMAL HIGH (ref 70–100)
Potassium: 4 meq/L (ref 3.5–5.3)
Sodium: 137 meq/L (ref 135–145)

## 2023-04-13 LAB — CBC
Absolute nRBC: 0 10*3/uL (ref <=0.00)
Hematocrit: 40.4 % (ref 34.7–43.7)
Hemoglobin: 13.7 g/dL (ref 11.4–14.8)
MCH: 29.8 pg (ref 25.1–33.5)
MCHC: 33.9 g/dL (ref 31.5–35.8)
MCV: 88 fL (ref 78.0–96.0)
MPV: 9.9 fL (ref 8.9–12.5)
Platelet Count: 357 10*3/uL — ABNORMAL HIGH (ref 142–346)
RBC: 4.59 10*6/uL (ref 3.90–5.10)
RDW: 12 % (ref 11–15)
WBC: 8.35 10*3/uL (ref 3.10–9.50)
nRBC %: 0 /100{WBCs} (ref <=0.0)

## 2023-04-13 LAB — LAB USE ONLY - LAVENDER - EDTA HOLD TUBE

## 2023-04-13 LAB — WHOLE BLOOD GLUCOSE POCT
Whole Blood Glucose POCT: 111 mg/dL — ABNORMAL HIGH (ref 70–100)
Whole Blood Glucose POCT: 122 mg/dL — ABNORMAL HIGH (ref 70–100)
Whole Blood Glucose POCT: 129 mg/dL — ABNORMAL HIGH (ref 70–100)

## 2023-04-13 MED ORDER — PANTOPRAZOLE SODIUM 40 MG PO TBEC
40.0000 mg | DELAYED_RELEASE_TABLET | Freq: Every morning | ORAL | Status: DC
Start: 2023-04-13 — End: 2023-04-16
  Administered 2023-04-13 – 2023-04-16 (×4): 40 mg via ORAL
  Filled 2023-04-13 (×4): qty 1

## 2023-04-13 NOTE — Progress Notes (Signed)
 Surgery Daily Progress Note   Team 5, [facility]  [phone] or [phone]    Date/Time: 04/13/23 9:43 AM  Bed:  T676/T676.98  Hospital Day: 6  3 Days Post-Op  Procedure(s):  ESOPHAGOGASTRODUODENOSCOPY (EGD), ESOPHAGEAL ULTRASOUND (EUS) LIMITED TO THE ESOPHAGUS, STOMACH OR DUODENUM    Assessment:   Valerie May is a 53 y.o. 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     Plan:   - follow up repeat biopsy   - follow up somatostatin  - Diet: regular, as tolerated  - remaining care per primary team      Interval History:   NAOE     Pain well controlled,+ nausea/-emesis. -Flatus/-BM    UOP:    Diet: Supervise For Meals Frequency: All meals  Ensure Plus High Protein Supplement Quantity: A. One; Flavor: Special Educational Needs Teacher; Frequency: TID (3 times a day) with meals  Adult diet Therapeutic/ Modified; Solid; Regular (IDDSI level 7); Thin (IDDSI level 0); High fiber, GI Soft  AFTER HOURS MEAL TRAY  AFTER HOURS MEAL TRAY       Medications:     Current Facility-Administered Medications   Medication Dose Route Frequency    budesonide -formoterol   2 puff Inhalation BID    cetirizine   10 mg Oral Daily    insulin  lispro  1-5 Units Subcutaneous TID AC    montelukast   10 mg Oral QHS    pantoprazole   40 mg Oral QAM AC    polyethylene glycol  17 g Oral BID    senna-docusate  1 tablet Oral QHS    umeclidinium bromide   1 puff Inhalation QAM    venlafaxine   25 mg Oral TID MEALS    vitamin D  (ergocalciferol )  50,000 Units Oral Weekly       acetaminophen , albuterol , albuterol -ipratropium, benzocaine -menthol , benzonatate , carboxymethylcellulose sodium, dextrose  **OR** dextrose  **OR** dextrose  **OR** glucagon  (rDNA), dextrose  **OR** dextrose  **OR** dextrose  **OR** glucagon  (rDNA), HYDROmorphone , magnesium  sulfate, melatonin, naloxone , ondansetron  **OR** ondansetron , oxyCODONE , oxyCODONE , potassium & sodium phosphates , potassium chloride  **OR** potassium chloride  **OR** potassium chloride , saline     Physical  Exam:   Current Vitals:   Vitals:    04/13/23 0847   BP:    Pulse: 94   Resp:    Temp:    SpO2: 96%     Vital signs in last 24 hours:  Temp:  [97.7 F (36.5 C)-98.1 F (36.7 C)] 97.9 F (36.6 C)  Heart Rate:  [81-101] 94  Resp Rate:  [14-19] 19  BP: (114-135)/(71-108) 114/73  Body mass index is 34.34 kg/m.    Intake and Output Summary (Last 24 hours):  I/O last 3 completed shifts:  In: 4252.5 [P.O.:2750; I.V.:1502.5]  Out: 900 [Urine:900]    PHYSICAL EXAM:  General: NAD  Chest: Normal work of breathing   Abdomen:  Soft, non-distended, RUQ ttp, no rebound or guarding  Extremities: Moving all extremities spontaneously  Neuro:  Grossly normal  Skin: Warm     Labs:     Hematology   Recent Labs     04/12/23  0906 04/11/23  1730   WBC 7.55 10.71*   Hemoglobin 13.2 13.4   Hematocrit 40.0 40.1   Platelet Count 349* 334      Chemistry   Recent Labs     04/12/23  0906 04/11/23  1730   Sodium 134* 137   Potassium 4.0 3.4*   Chloride 104 104   CO2 24 26   BUN 6* 10   Creatinine 0.6  0.7   Glucose 94 125*   Calcium  9.0 8.7   Magnesium  2.1  --    Phosphorus 3.5  --       Coagulation   No results for input(s): PT, INR, PTT in the last 72 hours.   Liver Function Tests   Recent Labs     04/12/23  0906 04/11/23  1730 04/11/23  1258   AST (SGOT) 37 43*  --    ALT 57* 65*  --    Alkaline Phosphatase 108 122*  --    Protein, Total 6.8 6.9  --    Albumin 3.7 3.7  --    Bilirubin, Total 0.4 0.4  --    Lipase  --   --  28          Rads:   Radiological Procedure reviewed.  XR Hip right 2-3 vw with pelvis    Result Date: 04/13/2023  1. No fracture of the right hip or pelvis. However, assessment for nondisplaced fracture is limited. If there is persistent pain and difficulty weightbearing, then further assessment with MRI or CT would be recommended to assess for occult fracture. 2. Lumbar spondylosis. Derick Clap, MD 04/13/2023 9:35 AM     Signed by: Lupie Mirza, MD  Surgical oncology, Hepatobiliary surgery

## 2023-04-13 NOTE — Progress Notes (Signed)
 Case Management Progress Note       Patient: [name]    Active Hospital Problems    Diagnosis    Intractable abdominal pain    Pancreatic mass       Length of stay: Hospital Day 4      Disposition:  Repeat biopsy; abdominal discomfort. CBC CMP and abdominal xray ordered.   Discharge plan:  Home no CM needs  Anticipated date of discharge:  TBD  Barriers to discharge:  Medical readiness; symptom improvement     CM continuing peripherally follow for discharge needs.       Thersia Garland, MSW   Social Worker Case Manager I   [phone]

## 2023-04-13 NOTE — Anesthesia Postprocedure Evaluation (Signed)
 Anesthesia Post Evaluation    Patient: Valerie May    ESOPHAGOGASTRODUODENOSCOPY (EGD), ESOPHAGEAL ULTRASOUND (EUS) LIMITED TO THE ESOPHAGUS, STOMACH OR DUODENUM    Anesthesia type: general    Last Vitals:   Vitals Value Taken Time   BP 102/59 04/10/23 1710   Temp 36.1 C (97 F) 04/10/23 1635   Pulse 85 04/10/23 1710   Resp 20 04/10/23 1710   SpO2 96 % 04/10/23 1710                 Anesthesia Post Evaluation:     Patient Evaluated: PACU    Level of Consciousness: awake and alert    Pain Management: adequate  Multimodal analgesia pain management approach  Strategies: acetaminophen  and local anesthesia  Airway Patency: patent  Two or more mitigation strategies used for obstructive sleep apnea.  Strategies: multimodal analgesia and awake extubation    Anesthetic complications: No      PONV Status: none    Cardiovascular status: acceptable  Respiratory status: acceptable  Hydration status: acceptable          Signed by: Almarie MARLA Church, MD, 04/13/2023 4:24 PM

## 2023-04-13 NOTE — Plan of Care (Addendum)
 Nursing Progress Note - Shift Summary and Care Plan    Shift Summary:   X-rays taken today, pain meds given IV, palliative referral for pain put in  Visit Vitals  BP 103/64   Pulse 92   Temp 98.4 F (36.9 C) (Oral)   Resp 19   Ht 1.499 m (4' 11)   Wt 77.1 kg (170 lb)   LMP 03/18/2012   SpO2 93%   BMI 34.34 kg/m       Critical Events:   N/a    LDAs & Indication:  Patient Lines/Drains/Airways Status       Active Lines, Drains and Airways       Name Placement date Placement time Site Days    Peripheral IV 04/13/23 Left Hand 04/13/23  1011  Hand  less than 1                     Systems Overview:  Neuro:   A&Ox4  Resp:   On RA  Cardiac:   WDL  GI/GU:   Last BM Date: 04/11/23   Voiding freely  Nutrition:   Tolerating regular/GI soft/high fiber diet at times, still naseaous  +N/-V, zofran  IV given  Blood sugar checks  Activity/Safety/Mobility:   Patient is independent  FALLSRISK   Skin/Wounds:    Hyperpigmentation on bilateral knees, blanchable redness on back, scattered scars, bruising, and tattoos      VTE Prophylaxis:   Pt independent    Upcoming Tests/Procedures  N/a    Discharge Plan:  Data Unavailable   Planning to discharge home if pain regimen and nausea are improved    Care Plan    Problem: Moderate/High Fall Risk Score >5  Goal: Patient will remain free of falls  Outcome: Progressing     Problem: Pain interferes with ability to perform ADL  Goal: Pain at adequate level as identified by patient  Outcome: Progressing     Problem: Side Effects from Pain Analgesia  Goal: Patient will experience minimal side effects of analgesic therapy  Outcome: Progressing     Problem: Compromised Sensory Perception  Goal: Sensory Perception Interventions  Outcome: Progressing     Problem: Compromised Moisture  Goal: Moisture level Interventions  Outcome: Progressing     Problem: Compromised Activity/Mobility  Goal: Activity/Mobility Interventions  Outcome: Progressing     Problem: Compromised Nutrition  Goal: Nutrition  Interventions  Outcome: Progressing     Problem: Compromised Friction/Shear  Goal: Friction and Shear Interventions  Outcome: Progressing     Problem: Altered GI Function  Goal: Fluid and electrolyte balance are achieved/maintained  Outcome: Progressing  Goal: Elimination patterns are normal or improving  Outcome: Progressing

## 2023-04-13 NOTE — Progress Notes (Signed)
 Name: Ashlon Hippert    ### Patient Details  Date of Birth: [birth year]  MRN: [id number]    ### Encounter Details  Arrival Date: 03/21/2023 04:55 AM EST  Discharge Date: 04/07/2023 05:55 PM EDT  Encounter ID: [id number] TCM3/11/2023   5:55:00PM    ### Related interaction   - TCM V2 Post Discharge Outreach (Post Discharge TCM V2 Outreach 1) ([email or web address])    ### Required Interventions and Feedback     Call Status         Call Status:     No Attempt (edited by TF on 04/13/2023 10:47 AM EDT)    Comments::     Upon review of chart patient was re-admitted to hospital. No call required at this time. (edited by TF on 04/13/2023 10:48 AM EDT)    Verneita Antonio) [facility], Ambulatory Care Management  [facility]   [address]  [place], [place] [address]  [place] [phone]

## 2023-04-13 NOTE — Progress Notes (Addendum)
 MEDICINE PROGRESS NOTE    Date Time: 04/13/23 12:00 PM  Patient Name: [facility]  Attending Physician: Karyle Salvo, MD    Assessment / Plan:        53 year old female with past medical history of asthma, eosinophilia mild bronchiectasis, recurrent pneumonia, GERD, diabetes 2, polycythemia, obesity, prior acute hypercarbic respiratory failure presented with nausea, vomiting and abdominal pain.She was recently admitted to the hospital on 2/22 with nausea vomiting, abdominal pain and altered mental status.  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     #Intractable nausea, vomiting  #Diarrhea  #Abdominal pain  #Heartburn  #Pancreatic head lesion  CT abdomen pelvis showed minimal fluid adjacent to pancreatic head. ? intralesional hemorrhage postbiopsy  Status post EUS 04/10/2023, more samples was taken from the known pancreatic head mass, FNA  Follow-up repeat biopsy,cytology  Still feeling nauseous, per patient eating fine but she cannot keep it down  Ongoing complaint of abdominal discomfort  Lactic acid was elevated, status post IV fluid bolus and IV fluid  Lactic acidosis resolved, Paxton'd IV fluid yesterday  Encourage good oral intake, diet as tolerated  CBC BMP and abdominal x-ray ordered  Appreciate oncology and surgical oncology recommendations and assistance  Leukocytosis resolved  Feels constipated  IV Zofran , recent QTc 447  Respiratory panel negative  Continue supportive care  Hold off on IV antibiotic for now  Order somatostatin  Monitor signs and symptoms off of the IV fluid.  Anticipate discharge tomorrow if tolerate diet and no vomiting       #Constipation  Bowel protocol    #Right hip pain  X-ray right hip no fracture of the right hip or  pelvis  Symptomatic management     #Diabetes 2  Last A1c 6  Monitor blood glucose  Insulin  sliding scale     #GERD  PPI     #Asthma  #Obesity  Continue Symbicort , Singulair , albuterol  as needed  Given recent acute hypercarbic respiratory failure plan to avoid sedating meds  Will need sleep study      #Thrombocytosis  Likely reactive    Will monitor        DVT prophylaxis: Currently on hold       CODE STATUS full code      Case discussed with: Patient, RN, surgical oncology    Additional Diagnoses:   Malnutrition Documentation    Moderate malnutrition--related to inadequate protein energy intake  in the setting of acute illness as evidenced by intake <75% of estimated energy requirements for >1 week, >5% weight loss x 1 month.                Safety Checklist:     DVT prophylaxis:  CHEST guideline (See page [id number]) No   Foley:  [place] Rn Foley protocol Not present   IVs:  Peripheral IV   PT/OT: Not needed   Daily CBC & or Chem ordered:  SHM/ABIM guidelines (see #5) Yes, due to clinical and lab instability   Reference for approximate charges of common labs: CBC auto diff - $76  BMP - $99  Mg - $79    Lines:     Patient Lines/Drains/Airways Status       Active PICC Line / CVC Line / PIV Line / Drain / Airway /  Intraosseous Line / Epidural Line / ART Line / Line / Wound / Pressure Ulcer / NG/OG Tube       Name Placement date Placement time Site Days    Peripheral IV 04/09/23 20 G Diffusion Left Antecubital 04/09/23  0128  Antecubital  1    Peripheral IV 04/09/23 22 G Diffusion Left Hand 04/09/23  0119  Hand  1    ETT  7 mm 04/10/23  1502  -- less than 1                     Disposition: (Please see PAF column for Expected D/C Date)   Today's date: 04/13/2023  Admit Date: 04/08/2023 10:41 PM  LOS: 4         Subjective     CC: Intractable abdominal pain    Interval History/24 hour events: No temperature spikes      HPI/Subjective: Still nauseous, eating fine but says cannot keep it down.  Ongoing abdominal discomfort or  right upper quadrant and epigastric area.  Feels constipated and complain of fatigue.  Denies chest pain, shortness of breath or urinary symptoms    Review of Systems:     As per HPI    Physical Exam:     VITAL SIGNS PHYSICAL EXAM   Temp:  [97.7 F (36.5 C)-98.1 F (36.7 C)] 97.9 F (36.6 C)  Heart Rate:  [81-101] 94  Resp Rate:  [14-19] 19  BP: (114-135)/(71-108) 114/73  Blood Glucose:           Intake/Output Summary (Last 24 hours) at 04/13/2023 1200  Last data filed at 04/12/2023 2026  Gross per 24 hour   Intake 1376.25 ml   Output 900 ml   Net 476.25 ml    Physical Exam  General: awake, alert   Cardiovascular: regular rate and rhythm, no murmurs, rubs or gallops  Lungs: clear to auscultation bilaterally, without wheezing, rhonchi, or rales  Abdomen: soft, mild epigastric tenderness, no guarding no rebound,   normoactive bowel sounds  Extremities: no edema         Meds:     Medications were reviewed:  Current Facility-Administered Medications   Medication Dose Route Frequency    budesonide -formoterol   2 puff Inhalation BID    cetirizine   10 mg Oral Daily    insulin  lispro  1-5 Units Subcutaneous TID AC    montelukast   10 mg Oral QHS    pantoprazole   40 mg Oral QAM AC    polyethylene glycol  17 g Oral BID    senna-docusate  1 tablet Oral QHS    umeclidinium bromide   1 puff Inhalation QAM    venlafaxine   25 mg Oral TID MEALS    vitamin D  (ergocalciferol )  50,000 Units Oral Weekly     Infusion Meds[1]  PRN Medications[2]      Labs:     Labs (last 72 hours):    Recent Labs   Lab 04/12/23  0906 04/11/23  1730   WBC 7.55 10.71*   Hemoglobin 13.2 13.4   Hematocrit 40.0 40.1   Platelet Count 349* 334       Recent Labs   Lab 04/10/23  0459 04/07/23  0616   PT 13.2* 12.9   INR 1.2 1.1   PTT 33  --     Recent Labs   Lab 04/12/23  0906 04/11/23  1730   Sodium 134* 137   Potassium 4.0 3.4*   Chloride 104 104  CO2 24 26   BUN 6* 10   Creatinine 0.6 0.7   Calcium  9.0 8.7   Albumin 3.7 3.7   Protein, Total 6.8 6.9   Bilirubin,  Total 0.4 0.4   Alkaline Phosphatase 108 122*   ALT 57* 65*   AST (SGOT) 37 43*   Glucose 94 125*                   Microbiology, reviewed and are significant for:  Microbiology Results (last 15 days)       Procedure Component Value Units Date/Time    Stool Enteric Bacteria Panel - Salmonella, Shigella, Campylobacter And Shiga Toxin, PCR [[id number]] Collected: 04/09/23 1654    Order Status: Canceled Specimen: Stool Updated: 04/09/23 1733    Stool Cryptosporidium parvum and Giardia lamblia, Antigen [[id number]]  (Normal) Collected: 04/09/23 1654    Order Status: Completed Specimen: Stool Updated: 04/10/23 0426     Stool Cryptosporidium parvum antigen Negative     Stool Giardia lamblia antigen Negative    Respiratory Pathogen Panel With COVID-19, PCR [[id number]]  (Normal) Collected: 04/09/23 1653    Order Status: Completed Specimen: Nasopharyngeal Swab from Nasopharynx Updated: 04/10/23 0509     Adenovirus DNA Not Detected     Coronavirus HKU1 RNA Not Detected     Coronavirus NL63 RNA Not Detected     Coronavirus 229E RNA Not Detected     Coronavirus OC43 RNA Not Detected     Human Metapneumovirus RNA Not Detected     Human Rhinovirus/Enterovirus RNA Not Detected     Influenza A RNA Not Detected     Influenza B RNA Not Detected     Parainfluenza Virus 1 RNA Not Detected     Parainfluenza Virus 2 RNA Not Detected     Parainfluenza Virus 3 RNA Not Detected     Parainfluenza Virus 4 RNA Not Detected     Respiratory Syncytial Virus RNA Not Detected     Bordetella pertussis DNA Not Detected     Bordetella parapertussis DNA Not Detected     Chlamydophila pneumoniae DNA Not Detected     Mycoplasma pneumoniae DNA Not Detected     SARS-CoV-2 (COVID-19) RNA Not Detected    Narrative:      Testing performed using the [employer] FilmArray Respiratory Panel (RP 2.1).  This test is for the qualitative detection of respiratory pathogen nucleic acid. This assay cannot differentiate between Rhinovirus/Enterovirus. If necessary for  patient care, a positive result for Rhinovirus/Enterovirus may be followed-up using an alternate method. This assay should not be used if B. pertussis infection is specifically suspected as it is less sensitive than other alternatives. If suspected, ensure that B. pertussis specific testing is ordered. Recent administration of a nasal influenza vaccine may cause false positive results for Influenza A and/or Influenza B.    Viral and bacterial nucleic acids may persist even though no viable organism is present. Detection of nucleic acid does not imply that the corresponding organisms are infectious or are the causative agents of clinical symptoms. Positive results of this test do not rule out coinfection with other organisms. A negative result does not exclude the possibility of viral or bacterial infection.  Assay performance characteristics may vary with circulating strains and this assay may not be able to distinguish between existing viral strains and new variants as they emerge. Results of this test should not be used as the sole basis for diagnosis, treatment, or other patient management decisions.    This assay  is FDA authorized for nasopharyngeal swab samples.  Performance characteristics for Bronchoalveolar lavage samples have been determined by the [facility] laboratory. Other sample types are unacceptable.     Invalid results may be due to inhibiting substances in the specimen and recollection should occur.       Stool Clostridioides difficile Toxin B, PCR [[id number]]     Order Status: Canceled Specimen: Stool     Culture, Urine [[id number]] Collected: 04/09/23 0058    Order Status: Completed Specimen: Urine, Clean Catch Updated: 04/10/23 1011    Narrative:      1,000-9,000 CFU/mL of multiple bacterial morphotypes present.  Possible contamination, appropriate recollection is requested if clinically indicated.    Culture, Blood, Aerobic And Anaerobic [[id number]] Collected: 04/08/23 2327    Order Status:  Completed Specimen: Blood, Venous Updated: 04/13/23 0600     Culture Blood No growth at 4 days    Nares, MRSA (methicillin-resistant Staphylococcus aureus) Screening, PCR [[id number]]  (Normal) Collected: 04/03/23 1314    Order Status: Completed Specimen: Swab from Nares Updated: 04/03/23 1444     MRSA (methicillin resistant Staphylococcus aureus) DNA Not Detected     Comment: Testing performed with the Xpert MRSA NxG assay. This test is intended for the detection of methicillin-resistant Staphylococcus aureus (MRSA) DNA. This test is not intended to diagnose, guide, or monitor treatment for MRSA infections, or provide results of susceptibility to methicillin. A positive test result does not necessarily indicate the presence of viable organisms. A negative result does not preclude MRSA nasal colonization.       Respiratory Pathogen Panel With COVID-19, PCR [[id number]]  (Normal) Collected: 04/02/23 1017    Order Status: Completed Specimen: Nasopharyngeal Swab from Nasopharynx Updated: 04/02/23 1855     Adenovirus DNA Not Detected     Coronavirus HKU1 RNA Not Detected     Coronavirus NL63 RNA Not Detected     Coronavirus 229E RNA Not Detected     Coronavirus OC43 RNA Not Detected     Human Metapneumovirus RNA Not Detected     Human Rhinovirus/Enterovirus RNA Not Detected     Influenza A RNA Not Detected     Influenza B RNA Not Detected     Parainfluenza Virus 1 RNA Not Detected     Parainfluenza Virus 2 RNA Not Detected     Parainfluenza Virus 3 RNA Not Detected     Parainfluenza Virus 4 RNA Not Detected     Respiratory Syncytial Virus RNA Not Detected     Bordetella pertussis DNA Not Detected     Bordetella parapertussis DNA Not Detected     Chlamydophila pneumoniae DNA Not Detected     Mycoplasma pneumoniae DNA Not Detected     SARS-CoV-2 (COVID-19) RNA Not Detected    Narrative:      Testing performed using the [employer] FilmArray Respiratory Panel (RP 2.1).  This test is for the qualitative detection of respiratory  pathogen nucleic acid. This assay cannot differentiate between Rhinovirus/Enterovirus. If necessary for patient care, a positive result for Rhinovirus/Enterovirus may be followed-up using an alternate method. This assay should not be used if B. pertussis infection is specifically suspected as it is less sensitive than other alternatives. If suspected, ensure that B. pertussis specific testing is ordered. Recent administration of a nasal influenza vaccine may cause false positive results for Influenza A and/or Influenza B.    Viral and bacterial nucleic acids may persist even though no viable organism is present. Detection of nucleic acid does not imply that the  corresponding organisms are infectious or are the causative agents of clinical symptoms. Positive results of this test do not rule out coinfection with other organisms. A negative result does not exclude the possibility of viral or bacterial infection.  Assay performance characteristics may vary with circulating strains and this assay may not be able to distinguish between existing viral strains and new variants as they emerge. Results of this test should not be used as the sole basis for diagnosis, treatment, or other patient management decisions.    This assay is FDA authorized for nasopharyngeal swab samples.  Performance characteristics for Bronchoalveolar lavage samples have been determined by the [facility] laboratory. Other sample types are unacceptable.     Invalid results may be due to inhibiting substances in the specimen and recollection should occur.       Culture, Sputum and Lower Respiratory [[id number]] Collected: 04/02/23 0847    Order Status: Completed Specimen: Sputum, Expectorated Updated: 04/04/23 0705     Culture Respiratory Light growth of Mixed upper respiratory flora     Gram Stain Many WBCs      Few Squamous epithelial cells      Many Mixed respiratory flora    Narrative:      No Pseudomonas aeruginosa, No Staphylococcus aureus, No  Methicillin Resistant Staphylococcus aureus (MRSA).            Imaging and labs reviewed         Signed by: Wayna Majors, MD                [1]   Current Facility-Administered Medications   Medication Dose Route Frequency Last Rate   [2]   Current Facility-Administered Medications   Medication Dose Route    acetaminophen   650 mg Oral    albuterol   2.5 mg Nebulization    albuterol -ipratropium  3 mL Nebulization    benzocaine -menthol   1 lozenge Buccal    benzonatate   200 mg Oral    carboxymethylcellulose sodium  1 drop Both Eyes    dextrose   15 g of glucose Oral    Or    dextrose   12.5 g Intravenous    Or    dextrose   12.5 g Intravenous    Or    glucagon  (rDNA)  1 mg Intramuscular    dextrose   15 g of glucose Oral    Or    dextrose   12.5 g Intravenous    Or    dextrose   12.5 g Intravenous    Or    glucagon  (rDNA)  1 mg Intramuscular    HYDROmorphone   0.4 mg Intravenous    magnesium  sulfate  1 g Intravenous    melatonin  3 mg Oral    naloxone   0.2 mg Intravenous    ondansetron   4 mg Oral    Or    ondansetron   4 mg Intravenous    oxyCODONE   10 mg Oral    oxyCODONE   5 mg Oral    potassium & sodium phosphates   2 packet Oral    potassium chloride   0-60 mEq Oral    Or    potassium chloride   0-60 mEq Oral    Or    potassium chloride   10 mEq Intravenous    saline  2 spray Each Nare

## 2023-04-13 NOTE — Telephone Encounter (Signed)
 To schedule office visit for pancreas

## 2023-04-14 DIAGNOSIS — Z5986 Financial insecurity: Secondary | ICD-10-CM

## 2023-04-14 DIAGNOSIS — R4589 Other symptoms and signs involving emotional state: Secondary | ICD-10-CM

## 2023-04-14 DIAGNOSIS — Z5900 Homelessness unspecified: Secondary | ICD-10-CM

## 2023-04-14 DIAGNOSIS — K5901 Slow transit constipation: Secondary | ICD-10-CM

## 2023-04-14 DIAGNOSIS — Z59812 Housing instability, housed, homelessness in past 12 months: Secondary | ICD-10-CM

## 2023-04-14 DIAGNOSIS — Z515 Encounter for palliative care: Secondary | ICD-10-CM

## 2023-04-14 DIAGNOSIS — Z7189 Other specified counseling: Secondary | ICD-10-CM

## 2023-04-14 LAB — WHOLE BLOOD GLUCOSE POCT
Whole Blood Glucose POCT: 109 mg/dL — ABNORMAL HIGH (ref 70–100)
Whole Blood Glucose POCT: 209 mg/dL — ABNORMAL HIGH (ref 70–100)
Whole Blood Glucose POCT: 80 mg/dL (ref 70–100)

## 2023-04-14 LAB — CULTURE BLOOD AEROBIC AND ANAEROBIC: Culture Blood: NO GROWTH

## 2023-04-14 MED ORDER — OXYCODONE HCL 5 MG PO TABS
10.0000 mg | ORAL_TABLET | ORAL | Status: DC | PRN
Start: 2023-04-14 — End: 2023-04-14
  Administered 2023-04-14: 10 mg via ORAL
  Filled 2023-04-14: qty 2

## 2023-04-14 MED ORDER — LACTULOSE 10 GM/15ML PO SOLN
20.0000 g | Freq: Three times a day (TID) | ORAL | Status: DC | PRN
Start: 2023-04-14 — End: 2023-04-16

## 2023-04-14 MED ORDER — ONDANSETRON HCL 4 MG PO TABS
4.0000 mg | ORAL_TABLET | Freq: Three times a day (TID) | ORAL | Status: DC
Start: 2023-04-14 — End: 2023-04-16
  Administered 2023-04-14 – 2023-04-16 (×6): 4 mg via ORAL
  Filled 2023-04-14 (×6): qty 1

## 2023-04-14 MED ORDER — OXYCODONE HCL 5 MG PO TABS
10.0000 mg | ORAL_TABLET | Freq: Three times a day (TID) | ORAL | Status: DC | PRN
Start: 2023-04-14 — End: 2023-04-16
  Administered 2023-04-15 – 2023-04-16 (×4): 10 mg via ORAL
  Filled 2023-04-14 (×4): qty 2

## 2023-04-14 MED ORDER — ACETAMINOPHEN 500 MG PO TABS
1000.0000 mg | ORAL_TABLET | Freq: Three times a day (TID) | ORAL | Status: DC
Start: 2023-04-14 — End: 2023-04-16
  Administered 2023-04-14 – 2023-04-16 (×5): 1000 mg via ORAL
  Filled 2023-04-14 (×6): qty 2

## 2023-04-14 MED ORDER — BUTALBITAL-APAP-CAFFEINE 50-325-40 MG PO TABS
1.0000 | ORAL_TABLET | Freq: Three times a day (TID) | ORAL | Status: DC | PRN
Start: 2023-04-14 — End: 2023-04-16
  Administered 2023-04-14: 1 via ORAL
  Filled 2023-04-14: qty 1

## 2023-04-14 MED ORDER — METOCLOPRAMIDE HCL 5 MG PO TABS
5.0000 mg | ORAL_TABLET | Freq: Three times a day (TID) | ORAL | Status: DC
Start: 2023-04-14 — End: 2023-04-16
  Administered 2023-04-14 – 2023-04-16 (×6): 5 mg via ORAL
  Filled 2023-04-14 (×6): qty 1

## 2023-04-14 MED ORDER — BISACODYL 5 MG PO TBEC
5.0000 mg | DELAYED_RELEASE_TABLET | Freq: Once | ORAL | Status: AC
Start: 2023-04-14 — End: 2023-04-14
  Administered 2023-04-14: 5 mg via ORAL
  Filled 2023-04-14: qty 1

## 2023-04-14 MED ORDER — SENNOSIDES-DOCUSATE SODIUM 8.6-50 MG PO TABS
2.0000 | ORAL_TABLET | Freq: Every evening | ORAL | Status: DC
Start: 2023-04-14 — End: 2023-04-16
  Administered 2023-04-14 – 2023-04-15 (×2): 2 via ORAL
  Filled 2023-04-14 (×2): qty 2

## 2023-04-14 MED ORDER — OXYCODONE HCL 5 MG PO TABS
5.0000 mg | ORAL_TABLET | Freq: Three times a day (TID) | ORAL | Status: DC | PRN
Start: 2023-04-14 — End: 2023-04-16
  Administered 2023-04-16: 5 mg via ORAL
  Filled 2023-04-14: qty 1

## 2023-04-14 MED ORDER — BISACODYL 10 MG RE SUPP
10.0000 mg | Freq: Every day | RECTAL | Status: DC | PRN
Start: 2023-04-14 — End: 2023-04-16

## 2023-04-14 MED ORDER — ONDANSETRON HCL 4 MG PO TABS
8.0000 mg | ORAL_TABLET | Freq: Three times a day (TID) | ORAL | Status: DC
Start: 2023-04-14 — End: 2023-04-14

## 2023-04-14 NOTE — Progress Notes (Signed)
 Gastroenterology  PROGRESS NOTE  Gastroenterology Consult Service - [facility]  Epic Chat (Group): [facility]  [id number]  [facility] [place], [place] [address]  Appointments: [phone]      Date Time: 04/14/23 10:18 AM  Patient Name: [facility]    Assessment and Recommendations:   53 y.o. female with PMH significant for polycythemia, chronic pain, recent PNA, DM2, GERD, recent admission at [facility] for N/V, abd pain, found to have pancreatic head lesion, underwent EGD/EUS with [facility] 03/25/23, bx with inadequate tissue so plan was for repeat EUS Wed 3/12, however, patient left hospital AMA, who now presents to the hospital on 04/08/2023 with ongoing N/V, abd pain. GI consulted for repeat EUS scheduling, now s/p repeat EUS 3/14. GI re-engaged 3/18 for ongoing N/V.      Epigastric pain, nausea, vomiting, inability to tolerate PO - 2/2 below + possible gastroparesis given large food residue seen on prior EGD and hx DM + chronic constipation. She is also on opioids which is contributing to sxs.   Chronic constipation - possibly dysmotility vs pelvic floor dysfunction vs opioid induced. Needs to be started on scheduled daily bowel regimen.   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 Fine needle biopsy performed --> inadequate tissue from FNB, GI asked to repeat biopsy.   Repeat EUS attempted on 3/7, however, large amount of food residue was found in stomach and duodenum thus unable to perform EUS with FNB.  Repeat EUS 3/14: A mass was identified in the pancreatic head. The staging applies if malignancy is confirmed. Fine needle aspiration performed.   Candida esophagitis - found on EGD 2/26, on fluconazole .        Malnutrition Documentation    Moderate malnutrition--related to inadequate protein energy intake  in the setting of  acute illness as evidenced by intake <75% of estimated energy requirements for >1 week, >5% weight loss x 1 month.        Recommendations:  - Suspect gastroparesis contributing to sxs. Needs outpatient gastric emptying study for formal diagnosis.   - In meantime, recommend scheduling zofran  and trialing reglan .   - AVOID narcotics, needs to be weaned off opioids. Opioids are contributing to nausea and vomiting.   - Continue daily scheduled bowel regimen to avoid constipation. Can consider Linzess  in outpatient setting if needed.   - Follow up path results from repeat EUS bx 3/14.   - Ongoing surgery and oncology follow ups.   - Needs outpatient GI follow up, message sent to GI navigators for scheduling and AVS updated.   - GI team will sign off, please call us  back with questions or concerns.     Thank you for allowing [facility] to participate in your patients care. We will go to standby at this time. If further assistance is needed please re-consult the on call GI team.    Patient discussed and reviewed with Dr. Mardel.     I spent a total of 45 minutes with the patient today, which includes a review of records, discussion with nurses or other providers, face-to-face with the patient, counseling and coordinating care, any family discussion, and documentation of this care. This time is minus any separately billable procedures that may have been performed.     Subjective:   Patient reports ongoing abd pain, nausea, vomiting. Taking opioids  for pain.     Medications:     Current Facility-Administered Medications   Medication Dose Route Frequency    budesonide -formoterol   2 puff Inhalation BID    cetirizine   10 mg Oral Daily    insulin  lispro  1-5 Units Subcutaneous TID AC    montelukast   10 mg Oral QHS    pantoprazole   40 mg Oral QAM AC    polyethylene glycol  17 g Oral BID    senna-docusate  1 tablet Oral QHS    umeclidinium bromide   1 puff Inhalation QAM    venlafaxine   25 mg Oral TID MEALS    vitamin D   (ergocalciferol )  50,000 Units Oral Weekly       HOME MEDICATIONS:   Current Discharge Medication List        CONTINUE these medications which have NOT CHANGED    Details   acetaminophen  (TYLENOL ) 650 MG CR tablet TAKE 1 TABLET BY MOUTH EVERY 8 HOURS AS NEEDED FOR PAIN  Qty: 90 tablet, Refills: 1    Comments: DX Code Needed  .  Associated Diagnoses: Polyarthritis      albuterol  sulfate HFA (PROVENTIL ) 108 (90 Base) MCG/ACT inhaler Inhale 1-2 puffs into the lungs every 4 (four) hours as needed      benzonatate  (TESSALON ) 200 MG capsule Take 1 capsule (200 mg) by mouth 3 (three) times daily as needed for Cough      Budeson-Glycopyrrol-Formoterol  160-9-4.8 MCG/ACT Aerosol Inhale 2 puffs into the lungs 2 (two) times daily  Qty: 10.7 g, Refills: 5      !! budesonide -formoterol  (SYMBICORT ) 160-4.5 MCG/ACT inhaler Inhale 2 puffs into the lungs 2 (two) times daily as needed      !! budesonide -formoterol  (SYMBICORT ) 160-4.5 MCG/ACT inhaler Inhale 2 puffs into the lungs 2 (two) times daily  Qty: 1 each, Refills: 0      cetirizine  (ZyrTEC ) 10 MG tablet Take 1 tablet (10 mg) by mouth daily      diphenhydrAMINE -APAP, sleep, (Excedrin PM) 38-500 MG Tab Take by mouth daily      montelukast  (SINGULAIR ) 10 MG tablet Take 1 tablet (10 mg) by mouth nightly      pantoprazole  (PROTONIX ) 40 MG tablet TAKE 1 TABLET BY MOUTH EVERY DAY  Qty: 30 tablet, Refills: 1    Associated Diagnoses: Gastroesophageal reflux disease without esophagitis      umeclidinium bromide  (INCRUSE ELLIPTA ) 62.5 MCG/ACT Aerosol Pwdr, Breath Activated Inhale 1 puff into the lungs daily  Qty: 1 each, Refills: 0      vitamin D , ergocalciferol , (DRISDOL ) 50000 UNIT Cap Take 1 capsule (50,000 Units total) by mouth once a week  Qty: 12 capsule, Refills: 1    Associated Diagnoses: Routine general medical examination at a health care facility      metFORMIN (GLUCOPHAGE) 500 MG tablet Take 1 tablet (500 mg) by mouth 2 (two) times daily with meals      venlafaxine  (EFFEXOR )  25 MG tablet Take 1 tablet (25 mg) by mouth 3 (three) times daily with meals for 15 days  Qty: 45 tablet, Refills: 0       !! - Potential duplicate medications found. Please discuss with provider.            Physical Exam:   Visit Vitals  BP 91/60   Pulse 73   Temp 97.9 F (36.6 C) (Oral)   Resp 19   Ht 1.499 m (4' 11)   Wt 77.1 kg (170 lb)   LMP 03/18/2012  SpO2 96%   BMI 34.34 kg/m        Intake and Output Summary (Last 24 hours) at Date Time    Intake/Output Summary (Last 24 hours) at 04/14/2023 1018  Last data filed at 04/14/2023 0340  Gross per 24 hour   Intake 1570 ml   Output 1600 ml   Net -30 ml       Physical Exam:  General Examination:   GENERAL APPEARANCE: Alert, in no acute distress, well developed, well nourished.  HENT: No scleral icterus.  HEART: Clinically well perfused.  LUNGS: No respiratory distress.  ABDOMEN: Soft, non distended, non tender.     Labs:       Recent Labs   Lab 04/13/23  1254 04/12/23  0906 04/11/23  1730 04/10/23  0459 04/08/23  2327   WBC 8.35 7.55 10.71*  More results in Results Review 8.89   Hemoglobin 13.7 13.2 13.4  More results in Results Review 14.4   Hematocrit 40.4 40.0 40.1  More results in Results Review 42.1   Platelet Count 357* 349* 334  More results in Results Review 398*   MCV 88.0 89.7 89.5  More results in Results Review 87.5   Neutrophils %  --   --  60.6  --  49.1   More results in Results Review = values in this interval not displayed.       Recent Labs   Lab 04/13/23  1254 04/12/23  0906 04/11/23  1730 04/10/23  0459 04/08/23  2327   Sodium 137 134* 137 141 141   Potassium 4.0 4.0 3.4* 3.6 4.0   Chloride 104 104 104 110 107   CO2 25 24 26 21 25    BUN 8 6* 10 10 15    Creatinine 0.6 0.6 0.7 0.7 0.6   Calcium  9.5 9.0 8.7 9.0 10.2   Albumin  --  3.7 3.7 3.7 4.2   Protein, Total  --  6.8 6.9 6.7 7.0   Bilirubin, Total  --  0.4 0.4 0.6 0.5   Bilirubin Direct  --   --   --   --  0.2   Alkaline Phosphatase  --  108 122* 113 138*   ALT  --  57* 65* 44 54   AST (SGOT)   --  37 43* 33 30   Glucose 110* 94 125* 108* 118*        Recent Labs   Lab 04/10/23  0459   PT 13.2*   INR 1.2   PTT 33       Lab Results   Component Value Date    LIP 28 04/11/2023        Rads:     Radiology Results (24 Hour)       Procedure Component Value Units Date/Time    XR Abdomen Portable [[id number]] Collected: 04/13/23 1700    Order Status: Completed Updated: 04/13/23 1704    Narrative:      HISTORY: Constipation     COMPARISON: 04/11/2023    FINDINGS:   Moderate colonic stool burden, similar compared to prior study.  Nonobstructive bowel gas pattern. Cholecystectomy clips. No obvious  pneumoperitoneum, although assessment is somewhat limited on supine views.  Visualized lung bases are clear.      Impression:        1.Moderate colonic stool burden, similar compared to prior study, likely  related to constipation.  2.Nonobstructive bowel gas pattern.    Lillette CHRISTELLA Gillis, MD  04/13/2023 5:02 PM  Signed by: Kylie J Gravatt, PA

## 2023-04-14 NOTE — Nursing Progress Note (Signed)
 Nursing Progress Note - Shift Summary and Care Plan    Shift Summary:   Pt had modifications to PRN medications transitioning from IV meds to PO meds. Pt had PRN oxycodone  x1 this shift and scheduled reglan  and zofran  PO for nausea. Bowel regimen in progress with scheduled Miralax  and dulcolax given today.   Visit Vitals  BP 119/81   Pulse 98   Temp 97.3 F (36.3 C) (Oral)   Resp 21   Ht 1.499 m (4' 11)   Wt 77.1 kg (170 lb)   LMP 03/18/2012   SpO2 95%   BMI 34.34 kg/m        Critical Events:   No acute events    LDAs & Indication:  Patient Lines/Drains/Airways Status       Active Lines, Drains and Airways       Name Placement date Placement time Site Days    Peripheral IV 04/13/23 Left Hand 04/13/23  1011  Hand  1                     Systems Overview:  Neuro:   A&Ox4  Resp:   On RA  Cardiac:   No tele; VSS  GI/GU:   Last BM Date: 04/11/23   0.2 mL/kg/hr (24hr)   Nutrition:   Tolerating Regular High Fiber diet.   +N, Reglan  and Zofran  PO given; -V/D, +F  AC/HS; Pt had coverage x1 this shift (lunch time dose)  Activity/Safety/Mobility:   Patient is independent     Skin/Wounds:    Scattered bruising, scars      Upcoming Tests/Procedures  None at this time    Discharge Plan:  Data Unavailable   Palliative and Case Management following

## 2023-04-14 NOTE — Progress Notes (Signed)
 MEDICINE PROGRESS NOTE    Date Time: 04/14/23 12:38 PM  Patient Name: Valerie May  Attending Physician: Wilhelminia Pontiff, DO    Assessment / Plan:        53 year old female with past medical history of asthma, eosinophilia mild bronchiectasis, recurrent pneumonia, GERD, diabetes 2, polycythemia, obesity, prior acute hypercarbic respiratory failure presented with nausea, vomiting and abdominal pain.She was recently admitted to the hospital on 2/22 with nausea vomiting, abdominal pain and altered mental status.  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     #Intractable nausea, vomiting  #Diarrhea  #Abdominal pain  #Heartburn  #Pancreatic head lesion  #Gastroparesis  CT abdomen pelvis showed minimal fluid adjacent to pancreatic head. ? intralesional hemorrhage postbiopsy  Status post EUS 04/10/2023, more samples was taken from the known pancreatic head mass, FNA follow up outpatient  Follow-up repeat biopsy,cytology  Ongoing complaint of abdominal discomfort  Lactic acid was elevated, status post IV fluid bolus and IV fluid  Lactic acidosis resolved, [place] IV fluid yesterday  Encourage good oral intake, diet as tolerated  CBC BMP and abdominal x-ray ordered no acute findings -- constipated  Appreciate oncology and surgical oncology recommendations and assistance  Leukocytosis resolved  Feels constipated, start bowel regimen  Start zofran  PO standing and reglan  TID  Wean off opioids  Respiratory panel negative  Continue supportive care  Hold off on IV antibiotic  Order somatostatin     #Constipation  Bowel protocol    #Right hip pain  X-ray right hip no fracture of the right hip or pelvis  Symptomatic management     #Diabetes 2  Last A1c 6  Monitor blood  glucose  Insulin  sliding scale     #GERD  PPI     #Asthma  #Obesity  Continue Symbicort , Singulair , albuterol  as needed  Given recent acute hypercarbic respiratory failure plan to avoid sedating meds  Will need sleep study      #Thrombocytosis  Likely reactive    Will monitor     DVT prophylaxis: Currently on hold       CODE STATUS full code      Case discussed with: Patient, RN, surgical oncology, gastroenterology    Additional Diagnoses:   Malnutrition Documentation    Moderate malnutrition--related to inadequate protein energy intake  in the setting of acute illness as evidenced by intake <75% of estimated energy requirements for >1 week, >5% weight loss x 1 month.       Safety Checklist:     DVT prophylaxis:  CHEST guideline (See page [id number]) No   Foley:  [facility] Foley protocol Not present   IVs:  Peripheral IV   PT/OT: Not needed   Daily CBC & or Chem ordered:  SHM/ABIM guidelines (see #5) Yes, due to clinical and lab instability   Reference for approximate charges of common labs: CBC auto diff - $76  BMP - $99  Mg - $79    Lines:     Patient Lines/Drains/Airways Status       Active PICC Line / CVC Line / PIV Line / Drain / Airway / Intraosseous Line / Epidural Line / ART Line / Line / Wound / Pressure Ulcer / NG/OG Tube       Name Placement date Placement time  Site Days    Peripheral IV 04/09/23 20 G Diffusion Left Antecubital 04/09/23  0128  Antecubital  1    Peripheral IV 04/09/23 22 G Diffusion Left Hand 04/09/23  0119  Hand  1    ETT  7 mm 04/10/23  1502  -- less than 1                     Disposition: (Please see PAF column for Expected May/C Date)   Today's date: 04/14/2023  Admit Date: 04/08/2023 10:41 PM  LOS: 5         Subjective     CC: Intractable abdominal pain    Interval History/24 hour events: No temperature spikes    HPI/Subjective: says she is nauseous and ongoing lower abdominal pain  No BM in two days  Asking to speak to GI    Review of Systems:     As per HPI    Physical Exam:     VITAL  SIGNS PHYSICAL EXAM   Temp:  [97.2 F (36.2 C)-98.4 F (36.9 C)] 97.7 F (36.5 C)  Heart Rate:  [73-100] 100  Resp Rate:  [19-24] 19  BP: (91-125)/(60-81) 125/81  Blood Glucose:           Intake/Output Summary (Last 24 hours) at 04/14/2023 1238  Last data filed at 04/14/2023 1232  Gross per 24 hour   Intake 1130 ml   Output 600 ml   Net 530 ml    Physical Exam  General: awake, alert   Cardiovascular: regular rate and rhythm, no murmurs, rubs or gallops  Lungs: clear to auscultation bilaterally, without wheezing, rhonchi, or rales  Abdomen: soft, mild epigastric tenderness, no guarding no rebound,   normoactive bowel sounds  Extremities: no edema         Meds:     Medications were reviewed:  Current Facility-Administered Medications   Medication Dose Route Frequency    bisacodyl   5 mg Oral Once    budesonide -formoterol   2 puff Inhalation BID    cetirizine   10 mg Oral Daily    insulin  lispro  1-5 Units Subcutaneous TID AC    metoclopramide   5 mg Oral TID AC    montelukast   10 mg Oral QHS    ondansetron   4 mg Oral Q8H    pantoprazole   40 mg Oral QAM AC    polyethylene glycol  17 g Oral BID    senna-docusate  1 tablet Oral QHS    umeclidinium bromide   1 puff Inhalation QAM    venlafaxine   25 mg Oral TID MEALS    vitamin May  (ergocalciferol )  50,000 Units Oral Weekly     Infusion Meds[1]  PRN Medications[2]      Labs:     Labs (last 72 hours):    Recent Labs   Lab 04/13/23  1254 04/12/23  0906   WBC 8.35 7.55   Hemoglobin 13.7 13.2   Hematocrit 40.4 40.0   Platelet Count 357* 349*       Recent Labs   Lab 04/10/23  0459   PT 13.2*   INR 1.2   PTT 33    Recent Labs   Lab 04/13/23  1254 04/12/23  0906 04/11/23  1730   Sodium 137 134* 137   Potassium 4.0 4.0 3.4*   Chloride 104 104 104   CO2 25 24 26    BUN 8 6* 10   Creatinine 0.6 0.6 0.7   Calcium  9.5 9.0 8.7  Albumin  --  3.7 3.7   Protein, Total  --  6.8 6.9   Bilirubin, Total  --  0.4 0.4   Alkaline Phosphatase  --  108 122*   ALT  --  57* 65*   AST (SGOT)  --  37 43*    Glucose 110* 94 125*                   Microbiology, reviewed and are significant for:  Microbiology Results (last 15 days)       Procedure Component Value Units Date/Time    Stool Enteric Bacteria Panel - Salmonella, Shigella, Campylobacter And Shiga Toxin, PCR [[id number]] Collected: 04/09/23 1654    Order Status: Canceled Specimen: Stool Updated: 04/09/23 1733    Stool Cryptosporidium parvum and Giardia lamblia, Antigen [[id number]]  (Normal) Collected: 04/09/23 1654    Order Status: Completed Specimen: Stool Updated: 04/10/23 0426     Stool Cryptosporidium parvum antigen Negative     Stool Giardia lamblia antigen Negative    Respiratory Pathogen Panel With COVID-19, PCR [[id number]]  (Normal) Collected: 04/09/23 1653    Order Status: Completed Specimen: Nasopharyngeal Swab from Nasopharynx Updated: 04/10/23 0509     Adenovirus DNA Not Detected     Coronavirus HKU1 RNA Not Detected     Coronavirus NL63 RNA Not Detected     Coronavirus 229E RNA Not Detected     Coronavirus OC43 RNA Not Detected     Human Metapneumovirus RNA Not Detected     Human Rhinovirus/Enterovirus RNA Not Detected     Influenza A RNA Not Detected     Influenza B RNA Not Detected     Parainfluenza Virus 1 RNA Not Detected     Parainfluenza Virus 2 RNA Not Detected     Parainfluenza Virus 3 RNA Not Detected     Parainfluenza Virus 4 RNA Not Detected     Respiratory Syncytial Virus RNA Not Detected     Bordetella pertussis DNA Not Detected     Bordetella parapertussis DNA Not Detected     Chlamydophila pneumoniae DNA Not Detected     Mycoplasma pneumoniae DNA Not Detected     SARS-CoV-2 (COVID-19) RNA Not Detected    Narrative:      Testing performed using the [employer] FilmArray Respiratory Panel (RP 2.1).  This test is for the qualitative detection of respiratory pathogen nucleic acid. This assay cannot differentiate between Rhinovirus/Enterovirus. If necessary for patient care, a positive result for Rhinovirus/Enterovirus may be followed-up  using an alternate method. This assay should not be used if B. pertussis infection is specifically suspected as it is less sensitive than other alternatives. If suspected, ensure that B. pertussis specific testing is ordered. Recent administration of a nasal influenza vaccine may cause false positive results for Influenza A and/or Influenza B.    Viral and bacterial nucleic acids may persist even though no viable organism is present. Detection of nucleic acid does not imply that the corresponding organisms are infectious or are the causative agents of clinical symptoms. Positive results of this test do not rule out coinfection with other organisms. A negative result does not exclude the possibility of viral or bacterial infection.  Assay performance characteristics may vary with circulating strains and this assay may not be able to distinguish between existing viral strains and new variants as they emerge. Results of this test should not be used as the sole basis for diagnosis, treatment, or other patient management decisions.    This assay is  FDA authorized for nasopharyngeal swab samples.  Performance characteristics for Bronchoalveolar lavage samples have been determined by the [facility] laboratory. Other sample types are unacceptable.     Invalid results may be due to inhibiting substances in the specimen and recollection should occur.       Stool Clostridioides difficile Toxin B, PCR [[id number]]     Order Status: Canceled Specimen: Stool     Culture, Urine [[id number]] Collected: 04/09/23 0058    Order Status: Completed Specimen: Urine, Clean Catch Updated: 04/10/23 1011    Narrative:      1,000-9,000 CFU/mL of multiple bacterial morphotypes present.  Possible contamination, appropriate recollection is requested if clinically indicated.    Culture, Blood, Aerobic And Anaerobic [[id number]] Collected: 04/08/23 2327    Order Status: Completed Specimen: Blood, Venous Updated: 04/14/23 0600     Culture Blood No growth  at 5 days    Nares, MRSA (methicillin-resistant Staphylococcus aureus) Screening, PCR [[id number]]  (Normal) Collected: 04/03/23 1314    Order Status: Completed Specimen: Swab from Nares Updated: 04/03/23 1444     MRSA (methicillin resistant Staphylococcus aureus) DNA Not Detected     Comment: Testing performed with the Xpert MRSA NxG assay. This test is intended for the detection of methicillin-resistant Staphylococcus aureus (MRSA) DNA. This test is not intended to diagnose, guide, or monitor treatment for MRSA infections, or provide results of susceptibility to methicillin. A positive test result does not necessarily indicate the presence of viable organisms. A negative result does not preclude MRSA nasal colonization.       Respiratory Pathogen Panel With COVID-19, PCR [[id number]]  (Normal) Collected: 04/02/23 1017    Order Status: Completed Specimen: Nasopharyngeal Swab from Nasopharynx Updated: 04/02/23 1855     Adenovirus DNA Not Detected     Coronavirus HKU1 RNA Not Detected     Coronavirus NL63 RNA Not Detected     Coronavirus 229E RNA Not Detected     Coronavirus OC43 RNA Not Detected     Human Metapneumovirus RNA Not Detected     Human Rhinovirus/Enterovirus RNA Not Detected     Influenza A RNA Not Detected     Influenza B RNA Not Detected     Parainfluenza Virus 1 RNA Not Detected     Parainfluenza Virus 2 RNA Not Detected     Parainfluenza Virus 3 RNA Not Detected     Parainfluenza Virus 4 RNA Not Detected     Respiratory Syncytial Virus RNA Not Detected     Bordetella pertussis DNA Not Detected     Bordetella parapertussis DNA Not Detected     Chlamydophila pneumoniae DNA Not Detected     Mycoplasma pneumoniae DNA Not Detected     SARS-CoV-2 (COVID-19) RNA Not Detected    Narrative:      Testing performed using the [employer] FilmArray Respiratory Panel (RP 2.1).  This test is for the qualitative detection of respiratory pathogen nucleic acid. This assay cannot differentiate between  Rhinovirus/Enterovirus. If necessary for patient care, a positive result for Rhinovirus/Enterovirus may be followed-up using an alternate method. This assay should not be used if B. pertussis infection is specifically suspected as it is less sensitive than other alternatives. If suspected, ensure that B. pertussis specific testing is ordered. Recent administration of a nasal influenza vaccine may cause false positive results for Influenza A and/or Influenza B.    Viral and bacterial nucleic acids may persist even though no viable organism is present. Detection of nucleic acid does not imply that the corresponding  organisms are infectious or are the causative agents of clinical symptoms. Positive results of this test do not rule out coinfection with other organisms. A negative result does not exclude the possibility of viral or bacterial infection.  Assay performance characteristics may vary with circulating strains and this assay may not be able to distinguish between existing viral strains and new variants as they emerge. Results of this test should not be used as the sole basis for diagnosis, treatment, or other patient management decisions.    This assay is FDA authorized for nasopharyngeal swab samples.  Performance characteristics for Bronchoalveolar lavage samples have been determined by the [facility] laboratory. Other sample types are unacceptable.     Invalid results may be due to inhibiting substances in the specimen and recollection should occur.       Culture, Sputum and Lower Respiratory [[id number]] Collected: 04/02/23 0847    Order Status: Completed Specimen: Sputum, Expectorated Updated: 04/04/23 0705     Culture Respiratory Light growth of Mixed upper respiratory flora     Gram Stain Many WBCs      Few Squamous epithelial cells      Many Mixed respiratory flora    Narrative:      No Pseudomonas aeruginosa, No Staphylococcus aureus, No Methicillin Resistant Staphylococcus aureus (MRSA).            Imaging  and labs reviewed         Signed by: Jerri Labrum, DO              [1]   Current Facility-Administered Medications   Medication Dose Route Frequency Last Rate   [2]   Current Facility-Administered Medications   Medication Dose Route    acetaminophen   650 mg Oral    albuterol   2.5 mg Nebulization    albuterol -ipratropium  3 mL Nebulization    benzocaine -menthol   1 lozenge Buccal    benzonatate   200 mg Oral    butalbital -acetaminophen -caffeine   1 tablet Oral    carboxymethylcellulose sodium  1 drop Both Eyes    dextrose   15 g of glucose Oral    Or    dextrose   12.5 g Intravenous    Or    dextrose   12.5 g Intravenous    Or    glucagon  (rDNA)  1 mg Intramuscular    dextrose   15 g of glucose Oral    Or    dextrose   12.5 g Intravenous    Or    dextrose   12.5 g Intravenous    Or    glucagon  (rDNA)  1 mg Intramuscular    magnesium  sulfate  1 g Intravenous    melatonin  3 mg Oral    naloxone   0.2 mg Intravenous    oxyCODONE   10 mg Oral    oxyCODONE   5 mg Oral    potassium & sodium phosphates   2 packet Oral    potassium chloride   0-60 mEq Oral    Or    potassium chloride   0-60 mEq Oral    Or    potassium chloride   10 mEq Intravenous    saline  2 spray Each Nare
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
## 2023-04-14 NOTE — Plan of Care (Signed)
 Nursing Progress Note - Shift Summary and Care Plan    Shift Summary:   Pain controlled with PRN meds. Zofran  IV given once with good effect.  Visit Vitals  BP 103/65   Pulse 76   Temp 97.2 F (36.2 C) (Oral)   Resp 20   Ht 1.499 m (4' 11)   Wt 77.1 kg (170 lb)   LMP 03/18/2012   SpO2 99%   BMI 34.34 kg/m        Critical Events:   N/A    LDAs & Indication:  Patient Lines/Drains/Airways Status       Active Lines, Drains and Airways       Name Placement date Placement time Site Days    Peripheral IV 04/13/23 Left Hand 04/13/23  1011  Hand  less than 1                     Systems Overview:  Neuro:   A&Ox4  Resp:   On 2L, pt O2 91% when asleep. Baseline: RA  Cardiac:   No tele  GI/GU:   Last BM Date: 04/11/23   0.9 mL/kg/hr (24hr)   Nutrition:   Tolerating high fiber Regular diet.   + N/-V  Activity/Safety/Mobility:   Patient independent  Skin/Wounds:    Scattered scars, tattoos, bruises    Upcoming Tests/Procedures  N/A    Discharge Plan:  TBD    Care Plan    Problem: Moderate/High Fall Risk Score >5  Goal: Patient will remain free of falls  Outcome: Progressing     Problem: Pain interferes with ability to perform ADL  Goal: Pain at adequate level as identified by patient  Outcome: Progressing     Problem: Side Effects from Pain Analgesia  Goal: Patient will experience minimal side effects of analgesic therapy  Outcome: Progressing     Problem: Compromised Sensory Perception  Goal: Sensory Perception Interventions  Outcome: Progressing     Problem: Compromised Moisture  Goal: Moisture level Interventions  Outcome: Progressing     Problem: Compromised Activity/Mobility  Goal: Activity/Mobility Interventions  Outcome: Progressing     Problem: Compromised Nutrition  Goal: Nutrition Interventions  Outcome: Progressing     Problem: Compromised Friction/Shear  Goal: Friction and Shear Interventions  Outcome: Progressing     Problem: Altered GI Function  Goal: Fluid and electrolyte balance are achieved/maintained  Outcome:  Progressing  Goal: Elimination patterns are normal or improving  Outcome: Progressing

## 2023-04-15 ENCOUNTER — Telehealth (INDEPENDENT_AMBULATORY_CARE_PROVIDER_SITE_OTHER): Payer: Self-pay

## 2023-04-15 LAB — WHOLE BLOOD GLUCOSE POCT
Whole Blood Glucose POCT: 101 mg/dL — ABNORMAL HIGH (ref 70–100)
Whole Blood Glucose POCT: 206 mg/dL — ABNORMAL HIGH (ref 70–100)
Whole Blood Glucose POCT: 163 mg/dL — ABNORMAL HIGH (ref 70–100)

## 2023-04-15 MED ORDER — ONDANSETRON HCL 4 MG PO TABS
4.0000 mg | ORAL_TABLET | Freq: Three times a day (TID) | ORAL | 0 refills | Status: DC
Start: 2023-04-15 — End: 2023-04-16

## 2023-04-15 MED ORDER — OXYCODONE HCL 10 MG PO TABS
10.0000 mg | ORAL_TABLET | Freq: Three times a day (TID) | ORAL | 0 refills | Status: DC | PRN
Start: 2023-04-15 — End: 2023-04-16

## 2023-04-15 MED ORDER — PANTOPRAZOLE SODIUM 40 MG PO TBEC
40.0000 mg | DELAYED_RELEASE_TABLET | Freq: Two times a day (BID) | ORAL | 1 refills | Status: DC
Start: 2023-04-15 — End: 2023-09-24

## 2023-04-15 MED ORDER — SENNOSIDES-DOCUSATE SODIUM 8.6-50 MG PO TABS
2.0000 | ORAL_TABLET | Freq: Every evening | ORAL | 0 refills | Status: DC
Start: 2023-04-15 — End: 2023-04-16

## 2023-04-15 MED ORDER — METOCLOPRAMIDE HCL 5 MG PO TABS
5.0000 mg | ORAL_TABLET | Freq: Three times a day (TID) | ORAL | 0 refills | Status: DC
Start: 2023-04-15 — End: 2023-04-16

## 2023-04-15 NOTE — Discharge Instr - AVS First Page (Addendum)
 Reason for your Hospital Admission:  Abdominal pain      Instructions for after your discharge:  Please start taking zofran and reglan for your nausea and likely gastroparesis.   Please minimize your opioid intake.   Please take meclizine as needed for your dizziness / vertigo.  Please take 5 days of antibiotics for your ear infection.   Please follow up with your oncologist to discuss the pancreatic mass and the biopsy results.   Please follow up with the gastroenterologist for outpatient gastric emptying study as well as to follow up biopsy results.   Please follow up with the surgeon to discuss possible surgery and removal of your pancreas mass.  ct gastroparesis contributing to sxs. Needs outpatient gastric emptying study for formal diagnosis.   Please follow up with your primary care doctor in the next 2-4 weeks for routine post hospitalization follow up and management of chronic medical conditions.

## 2023-04-15 NOTE — Discharge Summary (Addendum)
 Marland Kitchen      MEDICINE DISCHARGE SUMMARY    Patient Name: Valerie May D  Attending Physician: Dorinda Hill, DO  Primary Care Physician: Serina Cowper, FNP    Date of Admission: 04/08/2023  Date of Discharge: 04/15/2023    Handoff to Outpatient Providers:     Please follow up with your oncologist to discuss the pancreatic mass and the biopsy results.   Please follow up with the gastroenterologist for outpatient gastric emptying study as well as to follow up biopsy results.   Please follow up with the surgeon to discuss possible surgery and removal of your pancreas mass.    Discharge Diagnoses:   #Intractable nausea, vomiting  #Diarrhea  #Abdominal pain  #Heartburn  #Pancreatic head lesion  #Gastroparesis  #Constipation  #Right hip pain  #Diabetes 2  #GERD  #Asthma  #Obesity  #Thrombocytosis  #Vertigo  #L ear pain, possible otitis media    Hospital Course    53 year old female with past medical history of asthma, eosinophilia mild bronchiectasis, recurrent pneumonia, GERD, diabetes 2, polycythemia, obesity, prior acute hypercarbic respiratory failure presented with nausea, vomiting and abdominal pain.She was recently admitted to the hospital on 2/22 with nausea vomiting, abdominal pain and altered mental status. 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     CT abdomen pelvis showed minimal fluid adjacent to pancreatic head ? intralesional hemorrhage postbiopsy. She is status post EUS 04/10/2023, more samples was taken from the known pancreatic head mass, FNA. XR abdomen revealed constipation. Gastroenterology, Oncology and Surgical Oncology consulted, will follow up multi-disciplinary discussions on treatment plan outpatient after biopsy results are back. Nausea  controlled with zofran and reglan for likely gastroparesis. Hospital course c/b right hip pain for which XR did not show any fractures. Patient ambulatory. Patient had some slight dizziness attributed to vertigo and L ear pain secondary to left otitis media which also may be related. Started on antibiotics and meclizine.    Palliative was consulted for assistance with symptomatic management. Patient has difficult living situation currently. CM aware and assisting with safe disposition.    Patient otherwise medically stable for discharge with outpatient follow up.     Disposition:  Home - TBD    Discharge Day Physical Exam:   Patient seen and examined on the day of discharge.     Temp:  [97.3 F (36.3 C)-98.2 F (36.8 C)] 97.7 F (36.5 C)  Heart Rate:  [92-101] 96  Resp Rate:  [16-23] 16  BP: (110-132)/(68-81) 113/72    General: Awake, Alert, no acute distress  Neck: Supple  Respiratory: clear, non-labored respirations  Cardiovascular: regular rate, clinically well-perfused  Gastrointestinal: soft, non-tender  Musculoskeletal: No peripheral edema.   Neuro: no focal deficits  Integumentary: No rash. No lesions.     Recent Labs:     Results       Procedure Component Value Units Date/Time    Whole Blood Glucose POCT [[id number]]  (Abnormal) Collected: 04/15/23 1121    Specimen: Blood, Capillary Updated: 04/15/23 1124     Whole Blood Glucose POCT 206 mg/dL     Whole Blood Glucose POCT [[id number]]  (Abnormal) Collected: 04/15/23 0819    Specimen: Blood, Capillary Updated: 04/15/23 0826     Whole Blood Glucose POCT 101 mg/dL     Whole  Blood Glucose POCT [[id number]]  (Normal) Collected: 04/14/23 1631    Specimen: Blood, Capillary Updated: 04/14/23 1648     Whole Blood Glucose POCT 80 mg/dL     Whole Blood Glucose POCT [[id number]]  (Abnormal) Collected: 04/14/23 1200    Specimen: Blood, Capillary Updated: 04/14/23 1205     Whole Blood Glucose POCT 209 mg/dL     Whole Blood Glucose POCT [[id number]]  (Abnormal)  Collected: 04/14/23 0756    Specimen: Blood, Capillary Updated: 04/14/23 0801     Whole Blood Glucose POCT 109 mg/dL     Culture, Blood, Aerobic And Anaerobic [[id number]] Collected: 04/08/23 2327    Specimen: Blood, Venous Updated: 04/14/23 0600     Culture Blood No growth at 5 days    Extra Tubes [[id number]] Collected: 04/13/23 1249    Specimen: Blood, Venous Updated: 04/13/23 2200    Narrative:      The following orders were created for panel order Extra Tubes.  Procedure                               Abnormality         Status                     ---------                               -----------         ------                     Tresa Res - EDTA Hold IHKV[[id number]]                       Final result                 Please view results for these tests on the individual orders.    Lavender - EDTA Hold Tube [[id number]] Collected: 04/13/23 1249    Specimen: Blood, Venous Updated: 04/13/23 2200     Extra Tube Hold for add-ons.    Somatostatin [[id number]] Collected: 04/13/23 1748    Specimen: Blood, Venous Updated: 04/13/23 1759    Whole Blood Glucose POCT [[id number]]  (Abnormal) Collected: 04/13/23 1627    Specimen: Blood, Capillary Updated: 04/13/23 1630     Whole Blood Glucose POCT 129 mg/dL     Basic Metabolic Panel [[id number]]  (Abnormal) Collected: 04/13/23 1254    Specimen: Blood, Venous Updated: 04/13/23 1336     Glucose 110 mg/dL      BUN 8 mg/dL      Creatinine 0.6 mg/dL      Calcium 9.5 mg/dL      Sodium 623 mEq/L      Potassium 4.0 mEq/L      Chloride 104 mEq/L      CO2 25 mEq/L      Anion Gap 8.0     GFR >60.0 mL/min/1.73 m2     CBC without Differential [[id number]]  (Abnormal) Collected: 04/13/23 1254    Specimen: Blood, Venous Updated: 04/13/23 1322     WBC 8.35 x10 3/uL      Hemoglobin 13.7 g/dL      Hematocrit 16.0 %      Platelet Count 357 x10 3/uL      MPV 9.9 fL      RBC 4.59 x10 6/uL  MCV 88.0 fL      MCH 29.8 pg      MCHC 33.9 g/dL      RDW 12 %      nRBC % 0.0 /100 WBC      Absolute  nRBC 0.00 x10 3/uL     Whole Blood Glucose POCT [[id number]]  (Abnormal) Collected: 04/13/23 1218    Specimen: Blood, Capillary Updated: 04/13/23 1222     Whole Blood Glucose POCT 122 mg/dL     Whole Blood Glucose POCT [[id number]]  (Abnormal) Collected: 04/13/23 0829    Specimen: Blood, Capillary Updated: 04/13/23 0835     Whole Blood Glucose POCT 111 mg/dL     Whole Blood Glucose POCT [[id number]]  (Normal) Collected: 04/12/23 1620    Specimen: Blood, Capillary Updated: 04/12/23 1626     Whole Blood Glucose POCT 84 mg/dL             Radiology and Procedures:   XR Abdomen Portable    Result Date: 04/13/2023  1.Moderate colonic stool burden, similar compared to prior study, likely related to constipation. 2.Nonobstructive bowel gas pattern. Legrand Pitts, MD 04/13/2023 5:02 PM    XR Hip right 2-3 vw with pelvis    Result Date: 04/13/2023  1. No fracture of the right hip or pelvis. However, assessment for nondisplaced fracture is limited. If there is persistent pain and difficulty weightbearing, then further assessment with MRI or CT would be recommended to assess for occult fracture. 2. Lumbar spondylosis. Fredrich Birks, MD 04/13/2023 9:35 AM    XR Abdomen Portable    Result Date: 04/11/2023  Nonobstructive bowel gas pattern. Moderate retained stool within the colon. Launa Flight, MD 04/11/2023 1:26 PM    CT Angio Chest (PE or trauma protocol)    Result Date: 04/09/2023  No acute pulmonary embolism. Demetrios Isaacs, MD 04/09/2023 3:34 AM    CT Abd/ Pelvis with IV Contrast    Result Date: 04/09/2023  1.Minimal fat stranding/fluid adjacent to the pancreatic head may represent postbiopsy changes. Recommend correlation with lipase to exclude acute pancreatitis. 2.Slightly increased size of the pancreatic head lesion, potentially reflecting intralesional hemorrhage post biopsy. Demetrios Isaacs, MD 04/09/2023 3:24 AM    XR Chest  AP Portable    Result Date: 04/09/2023  No acute pulmonary process. Judd Gaudier, MD 04/09/2023 12:13 AM    XR  Chest AP Portable    Result Date: 04/04/2023   No acute abnormality within the chest. Mosetta Putt, MD 04/04/2023 10:14 AM    XR Chest AP Portable    Result Date: 04/03/2023  1. Endotracheal tube in good position. 2. Mild hypoinflation with central vascular prominence and peribronchial thickening. 3. Remainder as above. Wilmon Pali, MD 04/03/2023 12:08 PM    XR Chest AP Portable    Result Date: 03/28/2023   No acute cardiopulmonary disease. Jasmine December D'Heureux, MD 03/28/2023 10:21 AM    MRI Abdomen W WO Contrast MRCP    Result Date: 03/24/2023   Exam quality degraded by artifact arising from motion. The following observations are made within these confines. 1.Pancreatic head 1.2 cm enhancing lesion, suspected neoplasm. This lesion appears confined of the pancreas and demonstrates no major vascular interfaces. Recommend EUS for further evaluation. 2.No findings suspicious for metastatic disease within the imaged abdomen. 3.Mid right renal 1.7 cm cyst, recently demonstrating intermediate density by CT and is benign. 4.Hepatic steatosis. 5.Colonic diverticulosis. Mosetta Putt, MD 03/24/2023 8:49 AM    CT Head WO Contrast    Result Date: 03/22/2023  No acute intracranial abnormality. Beaulah Dinning, MD 03/22/2023 7:28 PM    Shoulder Right 2+ Views    Result Date: 03/21/2023  1. No fracture of the shoulder. 2. Old healed fractures of the right lateral third and fourth ribs. Fredrich Birks, MD 03/21/2023 2:09 PM    CT Abd/Pelvis with IV Contrast only    Result Date: 03/21/2023  1.No acute abnormality within the chest, abdomen, or pelvis. 2.No findings of acute pulmonary embolism within the limitations stated above. 3.Hyperdense pancreatic head lesion. Further evaluation with MRI/MRCP is recommended. 4.Mildly hyperdense right renal lesion, possible hemorrhagic/proteinaceous cyst. This can also be further evaluated on MRI. 5.Additional chronic findings as above. Elease Etienne, DO 03/21/2023 8:51 AM    CT Angio Chest (PE study)    Result Date:  03/21/2023  1.No acute abnormality within the chest, abdomen, or pelvis. 2.No findings of acute pulmonary embolism within the limitations stated above. 3.Hyperdense pancreatic head lesion. Further evaluation with MRI/MRCP is recommended. 4.Mildly hyperdense right renal lesion, possible hemorrhagic/proteinaceous cyst. This can also be further evaluated on MRI. 5.Additional chronic findings as above. Elease Etienne, DO 03/21/2023 8:51 AM    Chest AP Portable    Result Date: 03/21/2023  No acute pulmonary process. Judd Gaudier, MD 03/21/2023 5:31 AM     Discharge Medications and Documented Allergies:        Discharge Medication List        Taking      acetaminophen 650 MG CR tablet  What changed:   how much to take  how to take this  reasons to take this  Commonly known as: TYLENOL  TAKE 1 TABLET BY MOUTH EVERY 8 HOURS AS NEEDED FOR PAIN     albuterol sulfate HFA 108 (90 Base) MCG/ACT inhaler  Dose: 1-2 puff  Commonly known as: PROVENTIL  Inhale 1-2 puffs into the lungs every 4 (four) hours as needed     benzonatate 200 MG capsule  Dose: 200 mg  Commonly known as: TESSALON  Take 1 capsule (200 mg) by mouth 3 (three) times daily as needed for Cough     Budeson-Glycopyrrol-Formoterol 160-9-4.8 MCG/ACT Aero  Dose: 2 puff  Inhale 2 puffs into the lungs 2 (two) times daily     * budesonide-formoterol 160-4.5 MCG/ACT inhaler  Dose: 2 puff  Commonly known as: SYMBICORT  Inhale 2 puffs into the lungs 2 (two) times daily     * budesonide-formoterol 160-4.5 MCG/ACT inhaler  Dose: 2 puff  Commonly known as: SYMBICORT  Inhale 2 puffs into the lungs 2 (two) times daily as needed     cetirizine 10 MG tablet  Dose: 10 mg  Commonly known as: ZyrTEC  Take 1 tablet (10 mg) by mouth daily     Excedrin PM 500-38 MG Tabs  Generic drug: diphenhydrAMINE-APAP (sleep)  Take by mouth daily     metoclopramide 5 MG tablet  Dose: 5 mg  Commonly known as: REGLAN  Take 1 tablet (5 mg) by mouth 3 (three) times daily before meals     montelukast 10 MG  tablet  Dose: 10 mg  Commonly known as: SINGULAIR  Take 1 tablet (10 mg) by mouth nightly     ondansetron 4 MG tablet  Dose: 4 mg  Commonly known as: ZOFRAN  Take 1 tablet (4 mg) by mouth every 8 (eight) hours     oxyCODONE 10 MG immediate release tablet  Dose: 10 mg  Commonly known as: ROXICODONE  Take 1 tablet (10  mg) by mouth every 8 (eight) hours as needed for Pain     pantoprazole 40 MG tablet  Dose: 40 mg  What changed: when to take this  Commonly known as: PROTONIX  Take 1 tablet (40 mg) by mouth 2 (two) times daily     senna-docusate 8.6-50 MG per tablet  Dose: 2 tablet  Commonly known as: PERICOLACE  Take 2 tablets by mouth nightly for 7 days     umeclidinium bromide 62.5 MCG/ACT Aepb  Dose: 1 puff  Commonly known as: INCRUSE ELLIPTA  Inhale 1 puff into the lungs daily     venlafaxine 25 MG tablet  Dose: 25 mg  Commonly known as: EFFEXOR  Take 1 tablet (25 mg) by mouth 3 (three) times daily with meals for 15 days     vitamin D (ergocalciferol) 50000 UNIT Caps  Dose: 50,000 Units  Commonly known as: DRISDOL  Take 1 capsule (50,000 Units total) by mouth once a week           * This list has 2 medication(s) that are the same as other medications prescribed for you. Read the directions carefully, and ask your doctor or other care provider to review them with you.                STOP taking these medications      metFORMIN 500 MG tablet  Commonly known as: GLUCOPHAGE              Allergies[1]    Discharge Instructions:     See discharge instructions on the AVS for this encounter.      Follow-up Information       Learn, Theron Arista, MD. Schedule an appointment as soon as possible for a visit.    Specialty: Surgery  Contact information:  [address]  [place] [address]  [phone]               [facility] Follow up in 6 week(s).    Specialty: Gastroenterology  Why: Office will call you to schedule appt.  Contact information:  [address]  [address]  [place]  [place] [address]  [phone]  Additional information:  Clinic is located at [address] [place] [address] on the [facility] campus. It is across the street from [facility]. From [employer], take [address] to [address] - [place].  Take the second right to turn on to [employer]. [facility] will be directly ahead on the left.    Free Valet Parking (Monday through Friday 7:00am-4:30pm) is available at Zone #1 near the Main Entrance. Look for the signs along [employer]. Free Patient and Visitor Self-Parking is available in the "B" - [employer] [address] at the [place] end of the campus or just across from the [employer] on the "D" - Counsellor Lot.   From the "B" - [employer] [address] enter Retail banker on the level you have parked on (B1, B1.5 or B3) on and take elevator to the 2nd floor.             Serina Cowper, FNP Follow up.    Specialty: Family Nurse Practitioner  Contact information:  [address]  [place] [address]  [phone]               Leonides Schanz, MD Follow up.    Specialties: Medical Oncology,  Hematology, Internal Medicine  Contact information:  [address]  [place] [address]  [phone]                             Patient Emergency Contact:  Extended Emergency Contact Information  Primary Emergency Contact: Hor,Sanros Sabino Dick Phone: [phone]  Relation: Son  Secondary Emergency Contact: Cruz,Nelson  Mobile Phone: [phone]  Relation: Brother  Preferred language: English  Interpreter needed? No    Attestations:   Patient was seen and examined on the day of discharge. For more information, please see MyChart or call [facility] at [phone].  Complete instructions including follow up information were recorded in the patient's After Visit Summary.    >35 minutes spent coordinating discharge and reviewing discharge  plan today.    Signed by: Dorinda Hill, DO  [facility]  Department of Medicine  P: [phone]  F: [phone]    CC: Serina Cowper, FNP          [1]   Allergies  Allergen Reactions    Benadryl [Diphenhydramine]     Dust Mite Extract     Methocarbamol Itching    Molds & Smuts     Other     Compazine [Prochlorperazine] Anxiety

## 2023-04-15 NOTE — Telephone Encounter (Addendum)
-----   Message from Carie Caddy, [place] sent at 04/14/2023 11:41 AM EDT -----  Regarding: Hospital follow up  Valerie May needs an outpatient GI follow-up appointment in GIFU timeframe: 6 weeks  DIAGNOSIS/REASON FOR VISIT:  ongoing nausea, vomiting,   Inpatient GI procedures done: EGD and EUS  Additional recommendations: GI recommendations: Needs GI office visit  Video visit ok? No  Carie Caddy, PA    Chart check:  Insurance coverage: yes  [facility] Discharge: INPATIENT  GI Procedure done inpatient: 04/10/23 EGD/EUS  Previous office visit with [facility] ambulatory provider: none    Navigator action:   Request for post hospital appointment sent to [facility] front office schedulers. Recommended timeframe for appointment is 6 weeks. Please offer appointment with Pura Spice FNP, [facility] PA, Priscille Heidelberg PA, or [place], [place]    Scheduling:  04/16/23 patient scheduled for appointment with  Gastroenterology [place]a Hacking, FNP)  05/27/2023 at 2:00 PM

## 2023-04-15 NOTE — Plan of Care (Signed)
 Nursing Progress Note - Shift Summary and Care Plan    Shift Summary:   Pt is medically D/C,  awaiting shelter placement.     Visit Vitals  BP 112/71   Pulse (!) 101   Temp 97.9 F (36.6 C) (Oral)   Resp 16   Ht 1.499 m (4\' 11" )   Wt 77.1 kg (170 lb)   LMP 03/18/2012   SpO2 90%   BMI 34.34 kg/m        Critical Events:   N/a    LDAs & Indication:  Patient Lines/Drains/Airways Status       Active Lines, Drains and Airways       Name Placement date Placement time Site Days    Peripheral IV 04/13/23 Left Hand 04/13/23  1011  Hand  2                     Systems Overview:  Neuro:   A&Ox 4   Resp:   On RA  Cardiac:   WDL  GI/GU:   Last BM Date: 04/14/23   0 mL/kg/hr (24hr)   Nutrition:   Regular high fiber diet  +Nausea, scheduled Zofran given per order  ACHS  Activity/Safety/Mobility:   Independent  Skin/Wounds:    Scattered bruising, scars, tattoos. Blanchable redness to back      VTE Prophylaxis:               - Non skid socks on  Upcoming Tests/Procedures  N/A    Discharge Plan:  - Medically d/c  -Awaiting shelter placement; case manager / Child psychotherapist following  Care Plan    Problem: Moderate/High Fall Risk Score >5  Goal: Patient will remain free of falls  Outcome: Progressing     Problem: Pain interferes with ability to perform ADL  Goal: Pain at adequate level as identified by patient  Outcome: Progressing     Problem: Side Effects from Pain Analgesia  Goal: Patient will experience minimal side effects of analgesic therapy  Outcome: Progressing     Problem: Compromised Sensory Perception  Goal: Sensory Perception Interventions  Outcome: Progressing     Problem: Compromised Moisture  Goal: Moisture level Interventions  Outcome: Progressing     Problem: Compromised Activity/Mobility  Goal: Activity/Mobility Interventions  Outcome: Progressing     Problem: Compromised Nutrition  Goal: Nutrition Interventions  Outcome: Progressing     Problem: Compromised Friction/Shear  Goal: Friction and Shear  Interventions  Outcome: Progressing     Problem: Altered GI Function  Goal: Fluid and electrolyte balance are achieved/maintained  Outcome: Progressing  Goal: Elimination patterns are normal or improving  Outcome: Progressing

## 2023-04-15 NOTE — Plan of Care (Signed)
 Nursing Progress Note - Shift Summary and Care Plan    Shift Summary:   No acute changes this shift.  Visit Vitals  BP 110/68   Pulse 92   Temp 97.7 F (36.5 C) (Oral)   Resp (!) 23   Ht 1.499 m (4\' 11" )   Wt 77.1 kg (170 lb)   LMP 03/18/2012   SpO2 94%   BMI 34.34 kg/m        Critical Events:   N/A  LDAs & Indication:  Patient Lines/Drains/Airways Status       Active Lines, Drains and Airways       Name Placement date Placement time Site Days    Peripheral IV 04/13/23 Left Hand 04/13/23  1011  Hand  1                     Systems Overview:  Neuro:   A&Ox4  Resp:   On RA  Cardiac:   No tele, Denies chest pain and SOB  GI/GU:   Last BM Date: 04/14/23   0 mL/kg/hr (24hr)   Nutrition:   Tolerating Regular diet  Activity/Safety/Mobility:   Patient is independent  Skin/Wounds:    Scattered scars, bruising, tattoos       Upcoming Tests/Procedures  N/A    Discharge Plan:  TBD    Care Plan    Problem: Moderate/High Fall Risk Score >5  Goal: Patient will remain free of falls  Outcome: Progressing     Problem: Pain interferes with ability to perform ADL  Goal: Pain at adequate level as identified by patient  Outcome: Progressing     Problem: Side Effects from Pain Analgesia  Goal: Patient will experience minimal side effects of analgesic therapy  Outcome: Progressing     Problem: Compromised Sensory Perception  Goal: Sensory Perception Interventions  Outcome: Progressing     Problem: Compromised Moisture  Goal: Moisture level Interventions  Outcome: Progressing     Problem: Compromised Activity/Mobility  Goal: Activity/Mobility Interventions  Outcome: Progressing     Problem: Compromised Nutrition  Goal: Nutrition Interventions  Outcome: Progressing     Problem: Compromised Friction/Shear  Goal: Friction and Shear Interventions  Outcome: Progressing     Problem: Altered GI Function  Goal: Fluid and electrolyte balance are achieved/maintained  Outcome: Progressing  Goal: Elimination patterns are normal or improving  Outcome:  Progressing

## 2023-04-16 ENCOUNTER — Other Ambulatory Visit: Payer: Self-pay

## 2023-04-16 ENCOUNTER — Telehealth (INDEPENDENT_AMBULATORY_CARE_PROVIDER_SITE_OTHER): Payer: Self-pay

## 2023-04-16 LAB — FINE NEEDLE ASPIRATE

## 2023-04-16 LAB — WHOLE BLOOD GLUCOSE POCT
Whole Blood Glucose POCT: 124 mg/dL — ABNORMAL HIGH (ref 70–100)
Whole Blood Glucose POCT: 142 mg/dL — ABNORMAL HIGH (ref 70–100)

## 2023-04-16 MED ORDER — OXYCODONE HCL 10 MG PO TABS
10.0000 mg | ORAL_TABLET | Freq: Three times a day (TID) | ORAL | 0 refills | Status: DC | PRN
Start: 2023-04-16 — End: 2023-04-21
  Filled 2023-04-16: qty 7, 3d supply, fill #0

## 2023-04-16 MED ORDER — MORPHINE SULFATE 4 MG/ML IJ/IV SOLN (WRAP)
4.0000 mg | Freq: Once | Status: AC
Start: 2023-04-16 — End: 2023-04-16
  Administered 2023-04-16: 4 mg via INTRAVENOUS
  Filled 2023-04-16: qty 1

## 2023-04-16 MED ORDER — ONDANSETRON HCL 4 MG/2ML IJ SOLN
4.0000 mg | Freq: Once | INTRAMUSCULAR | Status: AC
Start: 2023-04-16 — End: 2023-04-16
  Administered 2023-04-16: 4 mg via INTRAVENOUS
  Filled 2023-04-16: qty 2

## 2023-04-16 MED ORDER — SENNOSIDES-DOCUSATE SODIUM 8.6-50 MG PO TABS
2.0000 | ORAL_TABLET | Freq: Every evening | ORAL | 0 refills | Status: DC
Start: 2023-04-16 — End: 2023-04-21
  Filled 2023-04-16: qty 14, 7d supply, fill #0

## 2023-04-16 MED ORDER — MECLIZINE HCL 12.5 MG PO TABS
25.0000 mg | ORAL_TABLET | Freq: Three times a day (TID) | ORAL | 0 refills | Status: DC | PRN
Start: 2023-04-16 — End: 2023-04-16

## 2023-04-16 MED ORDER — AMOXICILLIN-POT CLAVULANATE 875-125 MG PO TABS
1.0000 | ORAL_TABLET | Freq: Two times a day (BID) | ORAL | 0 refills | Status: DC
Start: 2023-04-16 — End: 2023-04-16

## 2023-04-16 MED ORDER — METOCLOPRAMIDE HCL 5 MG PO TABS
5.0000 mg | ORAL_TABLET | Freq: Three times a day (TID) | ORAL | 0 refills | Status: AC
Start: 2023-04-16 — End: 2023-05-16
  Filled 2023-04-16: qty 90, 30d supply, fill #0

## 2023-04-16 MED ORDER — OXYCODONE HCL 5 MG PO TABS
5.0000 mg | ORAL_TABLET | Freq: Once | ORAL | Status: DC
Start: 2023-04-16 — End: 2023-04-16

## 2023-04-16 MED ORDER — AMOXICILLIN-POT CLAVULANATE 875-125 MG PO TABS
1.0000 | ORAL_TABLET | Freq: Two times a day (BID) | ORAL | 0 refills | Status: AC
Start: 2023-04-16 — End: 2023-04-21
  Filled 2023-04-16: qty 10, 5d supply, fill #0

## 2023-04-16 MED ORDER — MECLIZINE HCL 12.5 MG PO TABS
25.0000 mg | ORAL_TABLET | Freq: Three times a day (TID) | ORAL | 0 refills | Status: AC | PRN
Start: 2023-04-16 — End: 2023-04-23
  Filled 2023-04-16: qty 7, 2d supply, fill #0

## 2023-04-16 MED ORDER — ONDANSETRON HCL 4 MG PO TABS
4.0000 mg | ORAL_TABLET | Freq: Three times a day (TID) | ORAL | 0 refills | Status: AC
Start: 2023-04-16 — End: 2023-05-16
  Filled 2023-04-16: qty 90, 30d supply, fill #0

## 2023-04-16 NOTE — Consults (Signed)
See previous CM note.

## 2023-04-16 NOTE — Progress Notes (Signed)
 04/16/23 1632   Discharge Disposition   Patient preference/choice provided? Yes   Physical Discharge Disposition Home   Mode of Transportation Taxi   Patient/Family/POA notified of transfer plan Yes   Patient agreeable to discharge plan/expected d/c date? Yes   Family/POA agreeable to discharge plan/expected d/c date? Yes   Bedside nurse notified of transport plan? Yes   CM Interventions   Follow up appointment scheduled?(For PNA, COPD, MI) No   Reason no follow up scheduled? Family to schedule   Referral made for home health RN visit? No, Other (comment)   Multidisciplinary rounds/family meeting before d/c? Yes   Medicare Checklist   Is this a Medicare patient? No

## 2023-04-16 NOTE — Plan of Care (Signed)
 Medicine cross cover note:  Reached out by bedside RN regarding 10/10 generalized pain. Unable to take prn PO oxycodone due to Nausea. Morphine 4mg  IV once and Zofran 4mg  IV once ordered.

## 2023-04-16 NOTE — Telephone Encounter (Signed)
 Pt has been scheduled with Misty Stanley on 4/30 for her post hospital appt. Forde Radon

## 2023-04-16 NOTE — Nursing Progress Note (Signed)
 This RN attempted to give scheduled medications to pt, Pt said she wanted [place] to hold until she is ready to take medications. This RN notified charge Charity fundraiser.  CT attempted to obtain blood glucose on pt, Pt refused

## 2023-04-16 NOTE — Progress Notes (Signed)
 Chaplain Service      Background:  Visit Type: Follow-up was made by Chaplain with patient, Valerie May, based on Source: Chaplain Initiated.  Present at Visit: Patient.  Spiritual Care Provided to: patient.    .    Summary:  Spiritual Care Interventions: Provided prayer  Reason for Visit: Sacramental/ritual   Spiritual Care Outcomes: Patient expressed appreciation of visit  Patient was seen by Catholic volunteer:    [x]  Leanord Asal.  []  Vania Rea.  []  Tuyet N.[]   Renee Pain   []  Kalman Drape []  Sr. Murray Hodgkins  Spiritual Care Department  [phone]

## 2023-04-16 NOTE — Plan of Care (Signed)
 Nursing Progress Note - Shift Summary and Care Plan    Shift Summary:   Patient reported 10/10 generalized pain and nausea. One time dose of IV Zofran and IV Morphine given during shift. Patient discussed pain relief.  Visit Vitals  BP 120/67   Pulse 96   Temp 98 F (36.7 C) (Oral)   Resp 15   Ht 1.499 m (4\' 11" )   Wt 77.1 kg (170 lb)   LMP 03/18/2012   SpO2 90%   BMI 34.34 kg/m     Critical Events:   No critical events occurred during shift     LDAs & Indication:  Patient Lines/Drains/Airways Status       Active Lines, Drains and Airways       Name Placement date Placement time Site Days    Peripheral IV 04/13/23 Left Hand 04/13/23  1011  Hand  2                     Systems Overview:  Neuro:   A&Ox4   Resp:   On RA  Cardiac:   No Tele  GI/GU:   Last BM Date: 04/14/23     Intake/Output Summary (Last 24 hours) at 04/16/2023 1610  Last data filed at 04/16/2023 0511  Gross per 24 hour   Intake 650 ml   Output --   Net 650 ml       Nutrition:   Tolerating High Fiber GI soft diet.   +N/-V; Scheduled Zofran given   AC  Activity/Safety/Mobility:   Patient is Independent   @MODERATE  FALLS RISK@     Skin/Wounds:    Scattered scars/bruising/tattoos         Upcoming Tests/Procedures  No upcoming test/procedures    Discharge Plan:  TBD    Care Plan    Problem: Moderate/High Fall Risk Score >5  Goal: Patient will remain free of falls  Outcome: Progressing  Flowsheets (Taken 04/15/2023 2001)  Moderate Risk (6-13):   LOW-Fall Interventions Appropriate for Low Fall Risk   MOD-Consider activation of bed alarm if appropriate   MOD-Floor mat at bedside (where available) if appropriate   MOD-Consider a move closer to Nurses Station   MOD-Remain with patient during toileting   MOD-Place bedside commode and assistive devices out of sight when not in use   MOD-Utilize diversion activities   MOD-Perform dangle, stand, walk (DSW) prior to mobilization   MOD-include family in multidisciplinary POC discussions   MOD-Request PT/OT consult order  for patients with gait/mobility impairment   MOD-Use gait belt when appropriate   MOD- Consider video monitoring     Problem: Pain interferes with ability to perform ADL  Goal: Pain at adequate level as identified by patient  Outcome: Progressing  Flowsheets (Taken 04/16/2023 0132)  Pain at adequate level as identified by patient:   Identify patient comfort function goal   Assess for risk of opioid induced respiratory depression, including snoring/sleep apnea. Alert healthcare team of risk factors identified.   Assess pain on admission, during daily assessment and/or before any "as needed" intervention(s)   Reassess pain within 30-60 minutes of any procedure/intervention, per Pain Assessment, Intervention, Reassessment (AIR) Cycle   Evaluate patient's satisfaction with pain management progress   Evaluate if patient comfort function goal is met   Offer non-pharmacological pain management interventions   Include patient/patient care companion in decisions related to pain management as needed     Problem: Side Effects from Pain Analgesia  Goal: Patient will experience minimal  side effects of analgesic therapy  Outcome: Progressing  Flowsheets (Taken 04/16/2023 0132)  Patient will experience minimal side effects of analgesic therapy:   Monitor/assess patient's respiratory status (RR depth, effort, breath sounds)   Assess for changes in cognitive function   Evaluate for opioid-induced sedation with appropriate assessment tool (i.e. POSS)   Prevent/manage side effects per LIP orders (i.e. nausea, vomiting, pruritus, constipation, urinary retention, etc.)     Problem: Compromised Activity/Mobility  Goal: Activity/Mobility Interventions  Outcome: Progressing  Flowsheets (Taken 04/15/2023 2001)  Activity/Mobility Interventions: Pad bony prominences, TAP Seated positioning system when OOB, Promote PMP, Reposition q 2 hrs / turn clock, Offload heels     Problem: Compromised Friction/Shear  Goal: Friction and Shear  Interventions  Outcome: Progressing  Flowsheets (Taken 04/15/2023 2001)  Friction and Shear Interventions: Pad bony prominences, Off load heels, HOB 30 degrees or less unless contraindicated, Consider: TAP seated positioning, Heel foams     Problem: Altered GI Function  Goal: Fluid and electrolyte balance are achieved/maintained  Outcome: Progressing  Flowsheets (Taken 04/16/2023 0132)  Fluid and electrolyte balance are achieved/maintained:   Monitor/assess lab values and report abnormal values   Assess and reassess fluid and electrolyte status   Monitor for muscle weakness

## 2023-04-16 NOTE — Progress Notes (Addendum)
?  CASE MANAGEMENT PROGRESS NOTE       Patient: Valerie May  Admission Date: 04/08/2023 10:41 PM  Active Hospital Problems    Diagnosis    Intractable abdominal pain    Pancreatic mass       Length of stay: Hospital Day 7       DISPO:  Home with no needs.  Lyft transport.     Current estimated discharge date on file: Today    Barriers to discharge:  None on file.    CM spoke with patient about discharge plan. Patient reported she will be discharging to the address on file. Patient requested transport. CM scheduled a flex ride lyft for patient home to address on face sheet. CM also completed a SCM form for patient medications totaling $2,524.46, confirmed in chat with supervisor.    Updates: Patient will discharge after all milestones have been met.     CM continues to follow for care coordination.         Jola Schmidt, Case Manager

## 2023-04-16 NOTE — Discharge Summary -  Nursing (Signed)
 Patient discharged per order. Pertinent education provided and discharge instructions reviewed with patient. Addressed all questions and concerns. IV removed with catheter intact. Patient belongings accounted for and sent with patient. Patient discharged home via transportation with [employer].

## 2023-04-16 NOTE — Progress Notes (Signed)
 Palliative Medicine & Comprehensive Care  Service Phone Number: FX: [phone], Mon-Fri 9a-4p / [facility] Pager: [phone] (24/7)    Clinical Therapist - Initial Assessment     Date of Admission: 04/08/2023  Room/Bed: F621/H086.57    Situation:Provided introduction of Palliative Clinical Therapist and explanation of role.     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 She is s/p EGD/EUS  with FN biopsy on 02/26 but results were indeterminate, but suspicious for but suspicious for acinar cell carcinoma, NET or solid pseudopapillary neoplasm. She was supposed to get a repeat biopsy on 03/07 but procedure was aborted due to food residue. Hospitalization was complicated by an aspiration event which required her to remain intubated. She was scheduled to get another biopsy but ended up leaving AMA on 04/07/23. She returned a day later and underwent another EGD/EUS on 04/10/23 with repeat FN biopsy.     Assessment: Provided introduction of Palliative Clinical Therapist and explanation of role. Valerie May was awake and welcomed the visit. Valerie May was provided space to reflect on her medical journey and aspects of her life.   Valerie May reported that she is originally [place] but traveled to the [place] when she was 53 years old. She reported that once she arrived to the [place] things were very challenging for her and that she was met with several difficult situations. 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 She reported that as she tried to maneuver in her new environment she was faced with several negative challenges that caused her to be sad and depressed because she was unable to support and take care of herself. She reflected on days where she was unsure where she was  going to sleep and what she was going to eat. Valerie May said, "things were very hard". Acknowledged and validated Valerie May's journey from another country and the difficulties surrounding trying to adapt to a new environment safely.    Valerie May shared that she met her children's father and they were able to establish a good life together until he began being unfaithful. Valerie May explained that she could no longer be committed to her children's father and decided to leave the relationship. Valerie May reflected on how disappointed and hurt she was because she felt as though her life was going in the right direction to only have everything taken away from her. Valerie May reported that she worked several jobs in order to provide for her and her children however, in the end there was no money left over for anything except for them to live. Valerie May became tearful as she reflected on how happy she was. Acknowledged and validated the disappointment in losing her relationship and the life that she was pleased and happy with. Praised her in making a difficult decision for her own health and well being to leave a relationship that was not serving her well.    Valerie May reported that she contracted Covid and was admitted into the hospital for over three months. She described feeling alone and scared because she feared that she would die. She shared that her family was not as supportive as she would have liked them to be and when she was able to discharge there was no where  for her to go. She shared that during her hospitalization she lost her apartment. She reported that her Aunt, sister, and daughter were afraid to have her reside with them for fear that she would make them sick as well. She reflected on how hurtful it was being rejected by her family despite no longer being sick and not having any place to stay. She again became tearful as she described how disappointed in her family she was.    Valerie May shared that she  lost her mother four years ago and how she is still grieving her death. She shared that her father is still in [place] but the rest of her family is here in the [place]. She said, "I miss her so much". Valerie May expressed that her children are her world and that she would do anything for them. She expressed that her belief in God has brought her through her challenges and she believes that everything will eventually work out. She shared that her children, mother, father, and siblings are her "love" and that her desire is to be able to provide for herself and them one day. She said, " I leave everything to the Lord". Valerie May shared that she does not know what will happen when she is discharged from the hospital but her belief in God is strong and she knows that He will take care of her. Praised Valerie May for her strong faith in God and her belief that He will provide for her despite not knowing what will happen next. Encouraged Valerie May to continue to have faith and pray for comfort.      Care Network:  Primary caregiver: Self  Support system: Her four adult children  Living situation: Valerie May currently has no residence but states that she will move in w/her son after this hospitalization  Community resources: None mentioned    Psychosocial Factors:  Grief/bereavement: Valerie May mother passed four years ago  Depression/demoralization: Valerie May expressed depression due to several challenges in her life  Anxiety: Valerie May expressed angst over her current medical and personal situations    Spiritual/Socio-Cultural Factors:  Spirituality/religion: Catholic  Is this a resource?: Yes  Cultural/value systems: Valerie May has a church community that she attends    Advance Care Planning:  Code status: Full Code   Advance Directive: Patient does not have advance directive  Health care decision maker: Self    Interventions:  Evidence-Based Clinical Interventions provided during this admission? Yes -  Biopsychosocial Assessment and Provided Psychosocial Support      Recommendations/Plan:  Palliative Care Clinical Therapist will continue to be available to Ms. Cater throughout her hospitalization.  Palliative Team will continue to follow patient.          Valerie May, MSW  Palliative Care Clinical Therapist I  Palliative Medicine & Comprehensive Care  [phone]  Spectra: [phone]

## 2023-04-16 NOTE — Progress Notes (Signed)
 MEDICINE PROGRESS NOTE    Date Time: 04/16/23 12:34 PM  Patient Name: Valerie May  Attending Physician: Dorinda Hill, DO    Assessment / Plan:        53 year old female with past medical history of asthma, eosinophilia mild bronchiectasis, recurrent pneumonia, GERD, diabetes 2, polycythemia, obesity, prior acute hypercarbic respiratory failure presented with nausea, vomiting and abdominal pain.She was recently admitted to the hospital on 2/22 with nausea vomiting, abdominal pain and altered mental status.  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       #Intractable nausea, vomiting  #Diarrhea  #Abdominal pain  #Heartburn  #Pancreatic head lesion  #Gastroparesis  CT abdomen pelvis showed minimal fluid adjacent to pancreatic head. ? intralesional hemorrhage postbiopsy  Status post EUS 04/10/2023, more samples was taken from the known pancreatic head mass, FNA follow up Valerie  Follow-up repeat biopsy,cytology  Ongoing complaint of abdominal discomfort  Lactic acid was elevated, status post IV fluid bolus and IV fluid  Lactic acidosis resolved, 'May IV fluid yesterday  Encourage good oral intake, diet as tolerated  CBC BMP and abdominal x-ray ordered no acute findings -- constipated  Appreciate oncology and surgical oncology recommendations and assistance  Leukocytosis resolved  Feels constipated, started bowel regimen  Start zofran PO standing and reglan TID  Wean off opioids  Respiratory panel negative  Continue supportive care  Hold off on IV antibiotic  Order somatostatin     #Constipation  Bowel protocol    #Right hip pain  X-ray right hip no fracture of the right hip or pelvis  Symptomatic management     #Diabetes 2  Last A1c 6  Monitor blood  glucose  Insulin sliding scale     #GERD  PPI     #Asthma  #Obesity  Continue Symbicort, Singulair, albuterol as needed  Given recent acute hypercarbic respiratory failure plan to avoid sedating meds  Will need sleep study      #Thrombocytosis  Likely reactive    Will monitor     DVT prophylaxis: Currently on hold       CODE STATUS full code      Case discussed with: Patient, RN, surgical oncology, gastroenterology    Additional Diagnoses:   Malnutrition Documentation    Moderate malnutrition--related to inadequate protein energy intake  in the setting of acute illness as evidenced by intake <75% of estimated energy requirements for >1 week, >5% weight loss x 1 month.         Safety Checklist:     DVT prophylaxis:  CHEST guideline (See page [id number]) No   Foley:  [facility] Foley protocol Not present   IVs:  Peripheral IV   PT/OT: Not needed   Daily CBC & or Chem ordered:  SHM/ABIM guidelines (see #5) Yes, due to clinical and lab instability   Reference for approximate charges of common labs: CBC auto diff - $76  BMP - $99  Mg - $79    Lines:     Patient Lines/Drains/Airways Status       Active PICC Line / CVC Line / PIV Line / Drain / Airway / Intraosseous Line / Epidural Line / ART Line / Line / Wound / Pressure Ulcer / NG/OG Tube       Name  Placement date Placement time Site Days    Peripheral IV 04/09/23 20 G Diffusion Left Antecubital 04/09/23  0128  Antecubital  1    Peripheral IV 04/09/23 22 G Diffusion Left Hand 04/09/23  0119  Hand  1    ETT  7 mm 04/10/23  1502  -- less than 1                     Disposition: (Please see PAF column for Expected May/C Date)   Today's date: 04/16/2023  Admit Date: 04/08/2023 10:41 PM  LOS: 7         Subjective     CC: Intractable abdominal pain    Interval History/24 hour events: No temperature spikes    HPI/Subjective: sitting in chair dressed today  BM yesterday  Some ongoing mild nausea    Review of Systems:     As per HPI    Physical Exam:     VITAL SIGNS PHYSICAL EXAM    Temp:  [97.5 F (36.4 C)-98.4 F (36.9 C)] 98.2 F (36.8 C)  Heart Rate:  [85-106] 100  Resp Rate:  [15-16] 16  BP: (114-148)/(67-77) 135/68  Blood Glucose:           Intake/Output Summary (Last 24 hours) at 04/16/2023 1234  Last data filed at 04/16/2023 1030  Gross per 24 hour   Intake 550 ml   Output --   Net 550 ml    Physical Exam  General: awake, alert   Cardiovascular: regular rate and rhythm, no murmurs, rubs or gallops  Lungs: clear to auscultation bilaterally, without wheezing, rhonchi, or rales  Abdomen: soft, mild epigastric tenderness, no guarding no rebound,   normoactive bowel sounds  Extremities: no edema         Meds:     Medications were reviewed:  Current Facility-Administered Medications   Medication Dose Route Frequency    acetaminophen  1,000 mg Oral Q8H    budesonide-formoterol  2 puff Inhalation BID    cetirizine  10 mg Oral Daily    insulin lispro  1-5 Units Subcutaneous TID AC    metoclopramide  5 mg Oral TID AC    montelukast  10 mg Oral QHS    ondansetron  4 mg Oral Q8H    pantoprazole  40 mg Oral QAM AC    polyethylene glycol  17 g Oral BID    senna-docusate  2 tablet Oral QHS    umeclidinium bromide  1 puff Inhalation QAM    venlafaxine  25 mg Oral TID MEALS    vitamin May (ergocalciferol)  50,000 Units Oral Weekly     Infusion Meds[1]  PRN Medications[2]      Labs:     Labs (last 72 hours):    Recent Labs   Lab 04/13/23  1254 04/12/23  0906   WBC 8.35 7.55   Hemoglobin 13.7 13.2   Hematocrit 40.4 40.0   Platelet Count 357* 349*       Recent Labs   Lab 04/10/23  0459   PT 13.2*   INR 1.2   PTT 33    Recent Labs   Lab 04/13/23  1254 04/12/23  0906 04/11/23  1730   Sodium 137 134* 137   Potassium 4.0 4.0 3.4*   Chloride 104 104 104   CO2 25 24 26    BUN 8 6* 10   Creatinine 0.6 0.6 0.7   Calcium 9.5 9.0 8.7   Albumin  --  3.7 3.7   Protein, Total  --  6.8 6.9   Bilirubin, Total  --  0.4 0.4   Alkaline Phosphatase  --  108 122*   ALT  --  57* 65*   AST (SGOT)  --  37 43*   Glucose 110* 94  125*                   Microbiology, reviewed and are significant for:  Microbiology Results (last 15 days)       Procedure Component Value Units Date/Time    Stool Enteric Bacteria Panel - Salmonella, Shigella, Campylobacter And Shiga Toxin, PCR [[id number]] Collected: 04/09/23 1654    Order Status: Canceled Specimen: Stool Updated: 04/09/23 1733    Stool Cryptosporidium parvum and Giardia lamblia, Antigen [[id number]]  (Normal) Collected: 04/09/23 1654    Order Status: Completed Specimen: Stool Updated: 04/10/23 0426     Stool Cryptosporidium parvum antigen Negative     Stool Giardia lamblia antigen Negative    Respiratory Pathogen Panel With COVID-19, PCR [[id number]]  (Normal) Collected: 04/09/23 1653    Order Status: Completed Specimen: Nasopharyngeal Swab from Nasopharynx Updated: 04/10/23 0509     Adenovirus DNA Not Detected     Coronavirus HKU1 RNA Not Detected     Coronavirus NL63 RNA Not Detected     Coronavirus 229E RNA Not Detected     Coronavirus OC43 RNA Not Detected     Human Metapneumovirus RNA Not Detected     Human Rhinovirus/Enterovirus RNA Not Detected     Influenza A RNA Not Detected     Influenza B RNA Not Detected     Parainfluenza Virus 1 RNA Not Detected     Parainfluenza Virus 2 RNA Not Detected     Parainfluenza Virus 3 RNA Not Detected     Parainfluenza Virus 4 RNA Not Detected     Respiratory Syncytial Virus RNA Not Detected     Bordetella pertussis DNA Not Detected     Bordetella parapertussis DNA Not Detected     Chlamydophila pneumoniae DNA Not Detected     Mycoplasma pneumoniae DNA Not Detected     SARS-CoV-2 (COVID-19) RNA Not Detected    Narrative:      Testing performed using the [employer] FilmArray Respiratory Panel (RP 2.1).  This test is for the qualitative detection of respiratory pathogen nucleic acid. This assay cannot differentiate between Rhinovirus/Enterovirus. If necessary for patient care, a positive result for Rhinovirus/Enterovirus may be followed-up using an alternate  method. This assay should not be used if B. pertussis infection is specifically suspected as it is less sensitive than other alternatives. If suspected, ensure that B. pertussis specific testing is ordered. Recent administration of a nasal influenza vaccine may cause false positive results for Influenza A and/or Influenza B.    Viral and bacterial nucleic acids may persist even though no viable organism is present. Detection of nucleic acid does not imply that the corresponding organisms are infectious or are the causative agents of clinical symptoms. Positive results of this test do not rule out coinfection with other organisms. A negative result does not exclude the possibility of viral or bacterial infection.  Assay performance characteristics may vary with circulating strains and this assay may not be able to distinguish between existing viral strains and new variants as they emerge. Results of this test should not be used as the sole basis for diagnosis, treatment, or other patient management decisions.    This assay is FDA authorized for nasopharyngeal  swab samples.  Performance characteristics for Bronchoalveolar lavage samples have been determined by the [facility] laboratory. Other sample types are unacceptable.     Invalid results may be due to inhibiting substances in the specimen and recollection should occur.       Stool Clostridioides difficile Toxin B, PCR [[id number]]     Order Status: Canceled Specimen: Stool     Culture, Urine [[id number]] Collected: 04/09/23 0058    Order Status: Completed Specimen: Urine, Clean Catch Updated: 04/10/23 1011    Narrative:      1,000-9,000 CFU/mL of multiple bacterial morphotypes present.  Possible contamination, appropriate recollection is requested if clinically indicated.    Culture, Blood, Aerobic And Anaerobic [[id number]] Collected: 04/08/23 2327    Order Status: Completed Specimen: Blood, Venous Updated: 04/14/23 0600     Culture Blood No growth at 5 days     Nares, MRSA (methicillin-resistant Staphylococcus aureus) Screening, PCR [[id number]]  (Normal) Collected: 04/03/23 1314    Order Status: Completed Specimen: Swab from Nares Updated: 04/03/23 1444     MRSA (methicillin resistant Staphylococcus aureus) DNA Not Detected     Comment: Testing performed with the Xpert MRSA NxG assay. This test is intended for the detection of methicillin-resistant Staphylococcus aureus (MRSA) DNA. This test is not intended to diagnose, guide, or monitor treatment for MRSA infections, or provide results of susceptibility to methicillin. A positive test result does not necessarily indicate the presence of viable organisms. A negative result does not preclude MRSA nasal colonization.       Respiratory Pathogen Panel With COVID-19, PCR [[id number]]  (Normal) Collected: 04/02/23 1017    Order Status: Completed Specimen: Nasopharyngeal Swab from Nasopharynx Updated: 04/02/23 1855     Adenovirus DNA Not Detected     Coronavirus HKU1 RNA Not Detected     Coronavirus NL63 RNA Not Detected     Coronavirus 229E RNA Not Detected     Coronavirus OC43 RNA Not Detected     Human Metapneumovirus RNA Not Detected     Human Rhinovirus/Enterovirus RNA Not Detected     Influenza A RNA Not Detected     Influenza B RNA Not Detected     Parainfluenza Virus 1 RNA Not Detected     Parainfluenza Virus 2 RNA Not Detected     Parainfluenza Virus 3 RNA Not Detected     Parainfluenza Virus 4 RNA Not Detected     Respiratory Syncytial Virus RNA Not Detected     Bordetella pertussis DNA Not Detected     Bordetella parapertussis DNA Not Detected     Chlamydophila pneumoniae DNA Not Detected     Mycoplasma pneumoniae DNA Not Detected     SARS-CoV-2 (COVID-19) RNA Not Detected    Narrative:      Testing performed using the Biofire FilmArray Respiratory Panel (RP 2.1).  This test is for the qualitative detection of respiratory pathogen nucleic acid. This assay cannot differentiate between Rhinovirus/Enterovirus. If  necessary for patient care, a positive result for Rhinovirus/Enterovirus may be followed-up using an alternate method. This assay should not be used if B. pertussis infection is specifically suspected as it is less sensitive than other alternatives. If suspected, ensure that B. pertussis specific testing is ordered. Recent administration of a nasal influenza vaccine may cause false positive results for Influenza A and/or Influenza B.    Viral and bacterial nucleic acids may persist even though no viable organism is present. Detection of nucleic acid does not imply that the corresponding organisms are infectious or  are the causative agents of clinical symptoms. Positive results of this test do not rule out coinfection with other organisms. A negative result does not exclude the possibility of viral or bacterial infection.  Assay performance characteristics may vary with circulating strains and this assay may not be able to distinguish between existing viral strains and new variants as they emerge. Results of this test should not be used as the sole basis for diagnosis, treatment, or other patient management decisions.    This assay is FDA authorized for nasopharyngeal swab samples.  Performance characteristics for Bronchoalveolar lavage samples have been determined by the [facility] laboratory. Other sample types are unacceptable.     Invalid results may be due to inhibiting substances in the specimen and recollection should occur.       Culture, Sputum and Lower Respiratory [[id number]] Collected: 04/02/23 0847    Order Status: Completed Specimen: Sputum, Expectorated Updated: 04/04/23 0705     Culture Respiratory Light growth of Mixed upper respiratory flora     Gram Stain Many WBCs      Few Squamous epithelial cells      Many Mixed respiratory flora    Narrative:      No Pseudomonas aeruginosa, No Staphylococcus aureus, No Methicillin Resistant Staphylococcus aureus (MRSA).            Imaging and labs reviewed          Signed by: Dorinda Hill, DO              [1]   Current Facility-Administered Medications   Medication Dose Route Frequency Last Rate   [2]   Current Facility-Administered Medications   Medication Dose Route    albuterol  2.5 mg Nebulization    albuterol-ipratropium  3 mL Nebulization    benzocaine-menthol  1 lozenge Buccal    benzonatate  200 mg Oral    bisacodyl  10 mg Rectal    butalbital-acetaminophen-caffeine  1 tablet Oral    carboxymethylcellulose sodium  1 drop Both Eyes    dextrose  15 g of glucose Oral    Or    dextrose  12.5 g Intravenous    Or    dextrose  12.5 g Intravenous    Or    glucagon (rDNA)  1 mg Intramuscular    dextrose  15 g of glucose Oral    Or    dextrose  12.5 g Intravenous    Or    dextrose  12.5 g Intravenous    Or    glucagon (rDNA)  1 mg Intramuscular    lactulose  20 g Oral    magnesium sulfate  1 g Intravenous    melatonin  3 mg Oral    naloxone  0.2 mg Intravenous    oxyCODONE  10 mg Oral    oxyCODONE  5 mg Oral    potassium & sodium phosphates  2 packet Oral    potassium chloride  0-60 mEq Oral    Or    potassium chloride  0-60 mEq Oral    Or    potassium chloride  10 mEq Intravenous    saline  2 spray Each Nare

## 2023-04-20 ENCOUNTER — Other Ambulatory Visit: Payer: Self-pay

## 2023-04-20 DIAGNOSIS — D3A8 Other benign neuroendocrine tumors: Secondary | ICD-10-CM

## 2023-04-20 NOTE — Progress Notes (Signed)
 AMBULATORY CARE MANAGEMENT Outreach Call    Hospital patient admitted to:      Hospital Admission/Discharge Dates:        Reason for admission to hospital: pancreatic mass, N/V     Name: Valerie May    ### Patient Details  Date of Birth: [birth year]  MRN: [id number]    ### Encounter Details  Arrival Date: 04/08/2023 10:41 PM EDT  Discharge Date: 04/16/2023 06:17 PM EDT  Encounter ID: [id number] TCM3/20/2025   6:17:00PM    ### Related interaction  [facility] - TCM V2 Post Discharge Outreach (Post Discharge TCM V2 Outreach 1) ([email or web address]    ### Questions     Question 1   Discharge Instructions   Do you understand your discharge instructions? Please press 1 for yes, press 2 for no, or press 3 if you did not receive care instructions   Don't Understand Instructions (Issue Panel: Post Discharge - TCM)     Question 2   Medication Questions   Do you understand your medications? Please press 1 for yes, press 2 for no   Has RX Questions (Issue Panel: Post Discharge - TCM)     Question 3   Follow-Up Scheduled 2   Do you need help scheduling a follow up appointment? Please press 1 for no help needed, press 2 for I need help.   Need Help (Issue Panel: Post Discharge - TCM)    ### Required Interventions and Feedback     Call Status         Comments::     Reached pt for TCM hospital follow up. Introduced myself and reviewed purpose of call. Pt declined use of interpreter for call.  (edited by JF on 04/20/2023 03:14 PM EDT)     Obtaining Prescriptions         Comments:     Pt confirmed she received all prescriptions.  (edited by JF on 04/20/2023 03:15 PM EDT)     Obtaining Rx - Action List         No Interventions Needed:     Yes (edited by JF on 04/20/2023 03:15 PM EDT)     Follow Up Scheduled         Comments:     Pt has appt at [facility] on 4/1 and Gastroenterology appt 4/30.  (edited by JF on 04/20/2023 03:18 PM EDT)     Follow-Up Appointment  Help - Actions  Taken         Encouraged Patient to Make F/U Appt Sooner:     Yes (edited by JF on 04/20/2023 03:17 PM EDT)    Comments:     Discussed importance of follow up with Oncology- Provided contact information for office. Pt reports that she has received a call from someone advising they wanted to schedule more tests and she is waiting for them to call back- unsure who has contacted her. CM notes that an order has been placed for a PET Scan by Dr. Lee.(edited by Glenetta Hew on 04/20/2023 03:18 PM EDT)     Supplies/DME - Actions Taken         No Interventions Needed:     Yes (edited by JF on 04/20/2023 03:18 PM EDT)     General Status          Comments:     Pt reports that she is still having concerns with stomach pain. She reports that she is out of pain medication. (edited by JF on 04/20/2023 03:14 PM EDT)  General Status - Actions Taken         Suggested calling PCP:     Yes (edited by JF on 04/20/2023 03:14 PM EDT)    Comments:     Advised pt to contact PCP to discuss concerns. Reviewed that she can go to Urgent care if not able to see/ talk with PCP and pain is severe. (edited by JF on 04/20/2023 03:15 PM EDT)     Home Health - Actions Taken         No Interventions Needed:     Yes (edited by JF on 04/20/2023 03:18 PM EDT)     Discharge Instruction - Actions Taken         Reviewed Discharge Instructions:     Yes (edited by JF on 04/20/2023 03:15 PM EDT)    Reviewed What To Do if Problem Arises:     Yes (edited by JF on 04/20/2023 03:15 PM EDT)     Additional Questions         Comments:     Pt reports that she feels depressed. She asked for assistance on finding someone who she can see/ talk with. CM confirmed there is no SI or thoughts of harming herself. Advised pt that I will follow up with resources for her. (edited by JF on 04/20/2023 03:19 PM EDT)     Medication Questions - Issues         List of Issues:     Other (Add Details in Comments) (edited by JF on 04/20/2023 03:16 PM EDT)    Comments::     Pt reports that she  is out of pain medication, and she is still having pain.  (edited by JF on 04/20/2023 03:16 PM EDT)     Medication Questions - Actions List         Answered Medication Questions:     Yes (edited by JF on 04/20/2023 03:16 PM EDT)    Discussed Purpose of Medications:     Yes (edited by JF on 04/20/2023 03:16 PM EDT)    Other (Add Details in Comments):     Yes (edited by JF on 04/20/2023 03:16 PM EDT)    Comments::     Reviewed what her new medications are for and reviewed instruction. Encouraged pt to contact PCP office ([facility]) or return to Urgent care if needed. (edited by JF on 04/20/2023 03:17 PM EDT)    Will plan to follow up with pt to insure she has follow up appointments and assist with mental health follow up.      Rudolpho Sevin, RN, BSN, ACM-RN  Case Manager II  [facility]  [address]  [place], [place] [address]  (T) [phone]

## 2023-04-21 ENCOUNTER — Telehealth: Payer: Self-pay | Admitting: Family

## 2023-04-21 ENCOUNTER — Telehealth: Payer: Self-pay

## 2023-04-21 ENCOUNTER — Other Ambulatory Visit: Payer: Self-pay | Admitting: Hematology

## 2023-04-21 ENCOUNTER — Other Ambulatory Visit: Payer: Self-pay

## 2023-04-21 DIAGNOSIS — D3A8 Other benign neuroendocrine tumors: Secondary | ICD-10-CM

## 2023-04-21 MED ORDER — OXYCODONE HCL 10 MG PO TABS
10.0000 mg | ORAL_TABLET | Freq: Three times a day (TID) | ORAL | 0 refills | Status: DC | PRN
Start: 2023-04-21 — End: 2023-05-08

## 2023-04-21 MED ORDER — SENNOSIDES-DOCUSATE SODIUM 8.6-50 MG PO TABS
2.0000 | ORAL_TABLET | Freq: Every evening | ORAL | 0 refills | Status: AC
Start: 2023-04-21 — End: ?

## 2023-04-21 MED ORDER — OXYCODONE HCL 10 MG PO TABS
10.0000 mg | ORAL_TABLET | Freq: Three times a day (TID) | ORAL | 0 refills | Status: DC | PRN
Start: 2023-04-21 — End: 2023-04-21

## 2023-04-21 MED ORDER — VENLAFAXINE HCL 25 MG PO TABS
25.0000 mg | ORAL_TABLET | Freq: Three times a day (TID) | ORAL | 0 refills | Status: DC
Start: 2023-04-21 — End: 2023-04-28

## 2023-04-21 NOTE — Telephone Encounter (Signed)
 Patient referred to GI oncology after recent hospitalization at [facility] revealed well-differentiated pancreatic NET. Reviewed case with Dr. Vernel Ruth and recommends [phone] PET to complete staging then [facility] visit with medical oncology and surgery. Patient scheduled for Ga68 PET on 05/07/23 at 11:00 am at [facility]. Reviewed appointment information with patient over the phone along with preparation instructions. Patient scheduled for [facility] visit on 05/11/23 with Dr. Junita and Dr. Elzie. Patient has my direct phone number to call with any questions.

## 2023-04-23 NOTE — Progress Notes (Signed)
 Ambulatory Care Management Placed call to pt via Spanish interpreter [place] [id number] for follow up and to provide information for [facility] [phone]). There was no answer and no option to leave a message. Call was attempted X 2 with same result. CM notes that pt is scheduled for PCP visit on 4/1 with Dr. Eliberto. Will send secure chat regarding pt's request for follow up on mental health concerns.      Mliss FALCON, RN, BSN, ACM-RN  Case Manager II  [facility]  [address]  [place], [place] [address]  (T) [phone]

## 2023-04-24 ENCOUNTER — Telehealth (INDEPENDENT_AMBULATORY_CARE_PROVIDER_SITE_OTHER): Payer: Self-pay

## 2023-04-24 LAB — SOMATOSTATIN: Somatostatin Extraction: 21 pg/mL (ref <=30)

## 2023-04-24 NOTE — Telephone Encounter (Signed)
 RN received voice message from pt requesting a sooner appointment due to pain after a recent hospitalization. RN attempted to call pt back twice using Spanish interpreter. However, no one answered the phone. RN unable to leave voice message.

## 2023-04-28 ENCOUNTER — Ambulatory Visit (INDEPENDENT_AMBULATORY_CARE_PROVIDER_SITE_OTHER): Admitting: Internal Medicine

## 2023-04-28 VITALS — BP 117/79 | HR 91 | Temp 97.6°F | Wt 177.0 lb

## 2023-04-28 DIAGNOSIS — G4730 Sleep apnea, unspecified: Secondary | ICD-10-CM

## 2023-04-28 DIAGNOSIS — F32A Depression, unspecified: Secondary | ICD-10-CM

## 2023-04-28 MED ORDER — DULOXETINE HCL 30 MG PO CPEP
ORAL_CAPSULE | ORAL | 0 refills | Status: DC
Start: 2023-04-28 — End: 2023-09-25

## 2023-04-28 MED ORDER — SUMATRIPTAN SUCCINATE 25 MG PO TABS
25.0000 mg | ORAL_TABLET | ORAL | 3 refills | Status: DC | PRN
Start: 2023-04-28 — End: 2023-05-08

## 2023-04-28 MED ORDER — POLYETHYLENE GLYCOL 3350 17 GM/SCOOP PO POWD
17.0000 g | Freq: Every day | ORAL | 3 refills | Status: AC
Start: 2023-04-28 — End: ?

## 2023-04-30 ENCOUNTER — Encounter (INDEPENDENT_AMBULATORY_CARE_PROVIDER_SITE_OTHER): Payer: Self-pay

## 2023-04-30 NOTE — Progress Notes (Signed)
 Environmental Manager for Behavioral Health [facility] [place] [facility])    Received a referral to schedule an appointment. Initial assessment scheduled for 05/07/23 at 2:00 PM with [facility] Felicia Caulk.   Lyft transportation will be scheduled to pick up/ drop off patient.    Valerie May.  Patient Access Associate III   [facility]  [address], [address] [place] [address]  Phone: [phone]

## 2023-05-01 ENCOUNTER — Telehealth: Payer: Self-pay

## 2023-05-04 ENCOUNTER — Encounter (INDEPENDENT_AMBULATORY_CARE_PROVIDER_SITE_OTHER): Payer: Self-pay | Admitting: Internal Medicine

## 2023-05-04 NOTE — Progress Notes (Signed)
 [facility]           Subjective     History of Present Illness  Valerie May is a 53 year old female with a pancreatic mass diagnosed on CT scan.  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      She experiences significant abdominal pain affecting her entire abdomen and radiating to her leg and hand. She has been prescribed oxycodone  for pain management, which exacerbates her constipation. She occasionally uses Miralax  to manage constipation, having bowel movements once every four days without it.    She experiences generalized body pain, fatigue, and headaches. She has a history of migraines for which sumatriptan  was effective, but she currently lacks a prescription. She has been prescribed venlafaxine  for depression in the past but has not been taking it recently. Her depression has worsened following COVID-19 and the death of her mother.    She has a history of sleep apnea, characterized by snoring and observed apneas, but does not currently use a CPAP machine. She experiences excessive daytime sleepiness, which she attributes to her medications, including oxycodone .      Past Medical History:   Diagnosis Date    COVID-19     COVID-19 2021    Heart valve problem     Kidney stone     OSA (obstructive sleep apnea)     Pneumonia         Review of Systems    Objective   BP 117/79 (BP Site: Right arm, Patient Position: Sitting, Cuff Size: Large)   Pulse 91   Temp 97.6 F (36.4 C) (Oral)   Wt 80.3 kg (177 lb)   LMP 03/18/2012   SpO2 94%   BMI 35.75 kg/m   Physical Exam  Physical Exam  Gen: NAD  Cardiac: RRR no MRG  Pulm: CTAB  Abd: Soft, mild tenderness throughout        Results            Assessment/Plan     Assessment & Plan  Pancreatic Neuroendocrine Tumor  - Awaiting PET scan for staging  - Pt scheduled for tumor board/upcoming surgical planning    Chronic Pain  Dyspepsia  Reports widespread pain, including abdominal, leg, and hand pain, not clearly linked to  the pancreatic tumor.  Suspect it is multifactorial (which will take time to elucidate) but likely has some component of NSAID induced gastritis and opioid induced constipation.  Pain may be exacerbated by depression. Duloxetine  preferred for pain management and depression, potentially aiding fibromyalgia-like symptoms.  - Would advise avoidance of opioids for non-cancer/non-surgical related pain  - Initiate duloxetine  for pain management and depression.    Depression  - d/c venlafaxine  (pt hasn't found it effective)  - Start duloxetine  for depression management.  - Referred to a counselor for further support and management initially with  [facility]     Constipation  Significant constipation exacerbated by oxycodone  use. Bowel movements only with inconsistent Miralax  use.  - Instruct to take Miralax  daily.  - Consider suppositories and enemas if necessary.  - recommend avoidance of opioids for now    Obstructive Sleep Apnea  Symptoms include excessive daytime sleepiness and snoring. Not using CPAP and lacks recent sleep study.  - Order a home sleep study to evaluate for obstructive sleep apnea.    Migraine  Migraine history with effective sumatriptan  use. Lacks current prescription.  - Prescribe sumatriptan  for migraine management.    Hypertension  Blood  pressure is normal today. Not taking losartan due to numbness in feet.  - Monitor blood pressure and consider alternative antihypertensive medications if needed.    Follow-up in 3 weeks      Verbal consent obtained to record this visit.     Valerie May was seen today for follow-up.    Diagnoses and all orders for this visit:    Depression, unspecified depression type  -     Referral to [facility]; Future    Sleep apnea, unspecified type  -     Home Sleep Study (HST); Future    Other orders  -     DULoxetine  (CYMBALTA ) 30 MG capsule; Take 1 capsule (30 mg) by mouth daily for 14 days, THEN 2 capsules (60 mg) daily.  -     polyethylene glycol (MIRALAX )  17 GM/SCOOP powder; Take 17 g by mouth daily  -     SUMAtriptan  (IMITREX ) 25 MG tablet; Take 1 tablet (25 mg) by mouth every 2 (two) hours as needed for Migraine      Kierre Hintz MD

## 2023-05-05 ENCOUNTER — Ambulatory Visit (INDEPENDENT_AMBULATORY_CARE_PROVIDER_SITE_OTHER)

## 2023-05-05 DIAGNOSIS — F32A Depression, unspecified: Secondary | ICD-10-CM
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
## 2023-05-06 NOTE — Telephone Encounter (Signed)
 RN attempted x2 to return pt's call re: medication refill. RN utilized Research officer, trade union ID#: [id number]. Pt did not answer. Unable to LVM.

## 2023-05-07 ENCOUNTER — Telehealth (INDEPENDENT_AMBULATORY_CARE_PROVIDER_SITE_OTHER): Payer: Self-pay

## 2023-05-07 ENCOUNTER — Encounter (INDEPENDENT_AMBULATORY_CARE_PROVIDER_SITE_OTHER): Payer: Self-pay

## 2023-05-07 ENCOUNTER — Encounter (INDEPENDENT_AMBULATORY_CARE_PROVIDER_SITE_OTHER)

## 2023-05-07 NOTE — Progress Notes (Signed)
 [facility] - [employer] Follow-Up Assessment     Interpreter Used [Y/N]:   No     Reason for Referral: Rent, housing, utilities    Summary:     CHW was asked to meet with patient in person on 05/05/23 Valerie May regarding housing insecurities and offered assistance.  Patient stated she is struggling with her health and financially not able to support herself and her son Valerie May whom is diagnosed with bipolar and Schizophrenia. Patient stated she tried applying for SNAP three months ago but did not get any notification due to her moving. Patient is seeking a home just for her and Valerie May, currently she is living in a shared space with her older son and girlfriend with her grandchild.  Patient appears to be very stressed and unhappy. Patient is behind in car payments and expects vehicle to get repossessed.    CHW suggested to reapply for [facility] and Medicaid. Based on patient diagnoses she may be referred to [place] whom has there own resources that could assist with housing. CHW will contact a team member at [employer] to assist with options.      Plan/Follow-Up: CHW will continue to follow up per protocol.       Valerie May [facility] Worker- [employer] and [facility]   [address], [address]  [place], [place] [address]  Office: [phone]  [email or web address]

## 2023-05-07 NOTE — Telephone Encounter (Signed)
 Environmental manager for Families - [employer] and [employer]    Opened encounter by error.         Valerie May [facility] Worker- [employer] and [facility]   [address]  [place], [place] [address]  Office: [phone]  [email or web address] [email or web address]

## 2023-05-08 ENCOUNTER — Encounter (INDEPENDENT_AMBULATORY_CARE_PROVIDER_SITE_OTHER): Payer: Self-pay | Admitting: Hospitalist

## 2023-05-08 ENCOUNTER — Encounter (INDEPENDENT_AMBULATORY_CARE_PROVIDER_SITE_OTHER): Payer: Self-pay

## 2023-05-08 ENCOUNTER — Ambulatory Visit (INDEPENDENT_AMBULATORY_CARE_PROVIDER_SITE_OTHER): Admitting: Hospitalist

## 2023-05-08 VITALS — BP 130/86 | HR 96 | Temp 98.2°F | Wt 169.0 lb

## 2023-05-08 DIAGNOSIS — M5416 Radiculopathy, lumbar region: Secondary | ICD-10-CM

## 2023-05-08 DIAGNOSIS — D721 Eosinophilia, unspecified: Secondary | ICD-10-CM | POA: Insufficient documentation

## 2023-05-08 DIAGNOSIS — J45909 Unspecified asthma, uncomplicated: Secondary | ICD-10-CM

## 2023-05-08 DIAGNOSIS — G43809 Other migraine, not intractable, without status migrainosus: Secondary | ICD-10-CM

## 2023-05-08 DIAGNOSIS — R946 Abnormal results of thyroid function studies: Secondary | ICD-10-CM

## 2023-05-08 DIAGNOSIS — R7303 Prediabetes: Secondary | ICD-10-CM

## 2023-05-08 DIAGNOSIS — F418 Other specified anxiety disorders: Secondary | ICD-10-CM

## 2023-05-08 DIAGNOSIS — G43909 Migraine, unspecified, not intractable, without status migrainosus: Secondary | ICD-10-CM | POA: Insufficient documentation

## 2023-05-08 DIAGNOSIS — E559 Vitamin D deficiency, unspecified: Secondary | ICD-10-CM

## 2023-05-08 DIAGNOSIS — D3A8 Other benign neuroendocrine tumors: Secondary | ICD-10-CM | POA: Insufficient documentation

## 2023-05-08 DIAGNOSIS — Z1239 Encounter for other screening for malignant neoplasm of breast: Secondary | ICD-10-CM

## 2023-05-08 MED ORDER — SUMATRIPTAN SUCCINATE 25 MG PO TABS
25.0000 mg | ORAL_TABLET | ORAL | 3 refills | Status: DC | PRN
Start: 2023-05-08 — End: 2023-10-19

## 2023-05-08 MED ORDER — TRAMADOL HCL 50 MG PO TABS
50.0000 mg | ORAL_TABLET | ORAL | 0 refills | Status: AC | PRN
Start: 2023-05-08 — End: 2023-05-15

## 2023-05-08 MED ORDER — ERGOCALCIFEROL 1.25 MG (50000 UT) PO CAPS: 50000.0000 [IU] | ORAL_CAPSULE | ORAL | 1 refills | Status: AC

## 2023-05-08 MED ORDER — MONTELUKAST SODIUM 10 MG PO TABS
10.0000 mg | ORAL_TABLET | Freq: Every evening | ORAL | 3 refills | Status: AC
Start: 2023-05-08 — End: ?

## 2023-05-08 MED ORDER — CITALOPRAM HYDROBROMIDE 20 MG PO TABS
20.0000 mg | ORAL_TABLET | Freq: Every day | ORAL | 5 refills | Status: DC
Start: 2023-05-08 — End: 2023-05-08

## 2023-05-08 MED ORDER — ESCITALOPRAM OXALATE 10 MG PO TABS
10.0000 mg | ORAL_TABLET | Freq: Every day | ORAL | 5 refills | Status: DC
Start: 2023-05-08 — End: 2023-09-14

## 2023-05-08 NOTE — Progress Notes (Signed)
 SURIAH PERAGINE (DOB [birth year]) will be reviewed at the [facility] GI tumor board on 05/11/23. Karinna D Boyar is a candidate for genetic testing and could be referred for genetic counseling at [facility] based on her diagnosis of a pancreatic neuroendocrine tumor.      Erik Havens, MS, [facility]  Certified Genetic Counselor  [facility]  [address]  [place], [place] [address]  T [phone]   F [phone]

## 2023-05-08 NOTE — Progress Notes (Signed)
 Language Spoken: English  Interpreter ID#:  n/a, fluent in patient's spoken language    HPI:   This is a 53 y.o. female here for f/u     1) R lumbar radiculopathy/chronic pain.  ESI did not help in past. MRI 9/24 with L5/S1 disc protrusion which could be cause.     2) Depression, wants to stop Cymbalta  and try Lexapro .  Involved with [facility].     3) Pancreatic neuroendocrine tumor - working with GI/surgery on surgical planning.         Medical History[1]  Past Surgical History[2]  Family History[3]  Social History[4]    ALLERGIES:   Allergies[5]    MEDICATIONS:   Current Medications[6]    PHYSICAL EXAM:   BP 130/86 (BP Site: Left arm, Patient Position: Sitting, Cuff Size: Large)   Pulse 96   Temp 98.2 F (36.8 C) (Oral)   Wt 76.7 kg (169 lb)   LMP 03/18/2012   SpO2 96%   BMI 34.13 kg/m     Physical Exam    General: awake, alert, oriented x 3    DIAGNOSTICS:     Lab Results   Component Value Date    WBC 8.35 04/13/2023    HGB 13.7 04/13/2023    HCT 40.4 04/13/2023    PLT 357 (H) 04/13/2023    CHOL 190 11/07/2020    TRIG 122 11/07/2020    HDL 56 11/07/2020    LDL 110 (H) 11/07/2020    ALT 57 (H) 04/12/2023    AST 37 04/12/2023    NA 137 04/13/2023    K 4.0 04/13/2023    CL 104 04/13/2023    CREAT 0.6 04/13/2023    BUN 8 04/13/2023    CO2 25 04/13/2023    TSH 0.12 (L) 03/22/2023    INR 1.2 04/10/2023    GLU 110 (H) 04/13/2023    HGBA1C 6.0 (H) 03/22/2023    ALKPHOS 108 04/12/2023       PET/CT Neuroendocrine Tumor Loc(Ga-68 Dotatate)  Result Date: 05/07/2023  IMPRESSION: 1.  Approximately 1.4 cm sized DOTATATE avid focus in the pancreas head, consistent with somatostatin receptor positive neoplasm. 2. No evidence for locoregional metastatic lymphadenopathy. No suspicious DOTATATE activity elsewhere to suggest distant metastasis. PQRS: H8469 There are no calcifications noted along the coronary arteries, (ACRad 36-). PQRS: G2952 Electronically signed by Salvatore Creeks, MD on 05/07/2023 2:43 PM    XR Abdomen  Portable  Result Date: 04/13/2023  1.Moderate colonic stool burden, similar compared to prior study, likely related to constipation. 2.Nonobstructive bowel gas pattern. Maritza Sidles, MD 04/13/2023 5:02 PM    XR Hip right 2-3 vw with pelvis  Result Date: 04/13/2023  1. No fracture of the right hip or pelvis. However, assessment for nondisplaced fracture is limited. If there is persistent pain and difficulty weightbearing, then further assessment with MRI or CT would be recommended to assess for occult fracture. 2. Lumbar spondylosis. Francy Ishikawa, MD 04/13/2023 9:35 AM    XR Abdomen Portable  Result Date: 04/11/2023  Nonobstructive bowel gas pattern. Moderate retained stool within the colon. Rudine Cos, MD 04/11/2023 1:26 PM    CT Angio Chest (PE or trauma protocol)  Result Date: 04/09/2023  No acute pulmonary embolism. Etheleen Her, MD 04/09/2023 3:34 AM    CT Abd/ Pelvis with IV Contrast  Result Date: 04/09/2023  1.Minimal fat stranding/fluid adjacent to the pancreatic head may represent postbiopsy changes. Recommend correlation with lipase to exclude acute pancreatitis. 2.Slightly increased size of  the pancreatic head lesion, potentially reflecting intralesional hemorrhage post biopsy. Etheleen Her, MD 04/09/2023 3:24 AM    XR Chest  AP Portable  Result Date: 04/09/2023  No acute pulmonary process. Canary Ceo, MD 04/09/2023 12:13 AM      ACTIVE PROBLEMS:   Problem List[7]    ASSESSMENT and PLAN:   53 y.o. female here for above issues  PRN Tramadol  for pain - [facility].  Defer to them on need for further imaging.   Switch to Lexapro .  Repeat TFTs (noted to be abnormal in Feb)   Refilled other meds.    F/U 1 month.       Aylla was seen today for abdominal pain.    Diagnoses and all orders for this visit:    Lumbar radiculopathy  -     Referral to [facility]; Future  -     Pain Referral: [facility]; Future  -     traMADol  (ULTRAM ) 50 MG tablet; Take 1 tablet (50 mg) by mouth  every 4 (four) hours as needed for Pain  -     montelukast  (SINGULAIR ) 10 MG tablet; Take 1 tablet (10 mg) by mouth nightly    Depression with anxiety  -     Discontinue: citalopram  (CeleXA ) 20 MG tablet; Take 1 tablet (20 mg) by mouth daily  -     escitalopram  (LEXAPRO ) 10 MG tablet; Take 1 tablet (10 mg) by mouth daily    Abnormal thyroid  function test  -     TSH with Reflex to Free T4; Future  -     TSH with Reflex to Free T4    Encounter for screening for malignant neoplasm of breast, unspecified screening modality  -     Mammo Digital 2D Screening Bilateral W CAD; Future    Uncomplicated asthma, unspecified asthma severity, unspecified whether persistent    Other migraine without status migrainosus, not intractable  -     SUMAtriptan  (IMITREX ) 25 MG tablet; Take 1 tablet (25 mg) by mouth every 2 (two) hours as needed for Migraine    Vitamin D  deficiency  -     ergocalciferol  (ERGOCALCIFEROL ) 1.25 MG (50000 UT) capsule; Take 1 capsule (50,000 Units) by mouth once a week               [1]   Past Medical History:  Diagnosis Date    COVID-19 2021    Kidney stone     OSA (obstructive sleep apnea)     Pneumonia    [2]   Past Surgical History:  Procedure Laterality Date    CYST REMOVAL      ESOPHAGOGASTRODUODENOSCOPY (EGD), ESOPHAGEAL ULTRASOUND (EUS) LIMITED TO THE ESOPHAGUS, STOMACH OR DUODENUM N/A 04/10/2023    Procedure: ESOPHAGOGASTRODUODENOSCOPY (EGD), ESOPHAGEAL ULTRASOUND (EUS) LIMITED TO THE ESOPHAGUS, STOMACH OR DUODENUM;  Surgeon: Werner Hamlet, MD;  Location: [facility];  Service: Gastroenterology;  Laterality: N/A;    ESOPHAGOGASTRODUODENOSCOPY (EGD), ESOPHAGEAL ULTRASOUND (EUS) WITH FINE NEEDLE ASPIRATION/BIOPSY OF ESOPHAGUS, STOMACH, OR DUODENUM N/A 03/25/2023    Procedure: ESOPHAGOGASTRODUODENOSCOPY (EGD), ESOPHAGEAL ULTRASOUND (EUS) WITH FINE NEEDLE ASPIRATION/BIOPSY OF ESOPHAGUS, STOMACH, OR DUODENUM;  Surgeon: Werner Hamlet, MD;  Location: [facility];  Service: Gastroenterology;   Laterality: N/A;    ESOPHAGOGASTRODUODENOSCOPY (EGD), ESOPHAGEAL ULTRASOUND (EUS) WITH FINE NEEDLE ASPIRATION/BIOPSY OF ESOPHAGUS, STOMACH, OR DUODENUM N/A 04/03/2023    Procedure: ESOPHAGOGASTRODUODENOSCOPY (EGD), ESOPHAGEAL ULTRASOUND (EUS) WITH FINE NEEDLE ASPIRATION/BIOPSY OF ESOPHAGUS, STOMACH, OR DUODENUM;  Surgeon: Wetzel Hammock, MD;  Location: [facility];  Service: Gastroenterology;  Laterality: N/A;    GALLBLADDER SURGERY      HERNIA REPAIR      HYSTERECTOMY      TUBAL LIGATION     [3]   Family History  Problem Relation Name Age of Onset    Heart attack Mother      Diabetes Father      Heart attack Maternal Grandfather      Heart attack Paternal Grandfather      Stroke Son      Heart attack Maternal Uncle      Stomach cancer Cousin      Bone cancer Maternal Aunt      Colon cancer Neg Hx      Breast cancer Neg Hx     [4]   Social History  Tobacco Use    Smoking status: Never     Passive exposure: Current    Smokeless tobacco: Never   Vaping Use    Vaping status: Never Used   Substance Use Topics    Alcohol  use: Not Currently    Drug use: Never   [5]   Allergies  Allergen Reactions    Benadryl  [Diphenhydramine ]     Dust Mite Extract     Methocarbamol Itching    Molds & Smuts     Other     Compazine [Prochlorperazine] Anxiety   [6]   Current Outpatient Medications:     acetaminophen  (TYLENOL ) 650 MG CR tablet, TAKE 1 TABLET BY MOUTH EVERY 8 HOURS AS NEEDED FOR PAIN (Patient taking differently: every 8 (eight) hours as needed), Disp: 90 tablet, Rfl: 1    albuterol  sulfate HFA (PROVENTIL ) 108 (90 Base) MCG/ACT inhaler, Inhale 1-2 puffs into the lungs every 4 (four) hours as needed, Disp: , Rfl:     benzonatate  (TESSALON ) 200 MG capsule, Take 1 capsule (200 mg) by mouth 3 (three) times daily as needed for Cough, Disp: , Rfl:     Budeson-Glycopyrrol-Formoterol  160-9-4.8 MCG/ACT Aerosol, Inhale 2 puffs into the lungs 2 (two) times daily, Disp: 10.7 g, Rfl: 5    budesonide -formoterol  (SYMBICORT ) 160-4.5 MCG/ACT  inhaler, Inhale 2 puffs into the lungs 2 (two) times daily, Disp: 1 each, Rfl: 0    cetirizine  (ZyrTEC ) 10 MG tablet, Take 1 tablet (10 mg) by mouth daily, Disp: , Rfl:     diphenhydrAMINE -APAP, sleep, (Excedrin PM) 38-500 MG Tab, Take by mouth daily, Disp: , Rfl:     DULoxetine  (CYMBALTA ) 30 MG capsule, Take 1 capsule (30 mg) by mouth daily for 14 days, THEN 2 capsules (60 mg) daily., Disp: 194 capsule, Rfl: 0    ergocalciferol  (ERGOCALCIFEROL ) 1.25 MG (50000 UT) capsule, Take 1 capsule (50,000 Units) by mouth once a week, Disp: 12 capsule, Rfl: 1    escitalopram  (LEXAPRO ) 10 MG tablet, Take 1 tablet (10 mg) by mouth daily, Disp: 30 tablet, Rfl: 5    metoclopramide  (REGLAN ) 5 MG tablet, Take 1 tablet (5 mg) by mouth 3 (three) times daily before meals, Disp: 90 tablet, Rfl: 0    montelukast  (SINGULAIR ) 10 MG tablet, Take 1 tablet (10 mg) by mouth nightly, Disp: 90 tablet, Rfl: 3    ondansetron  (ZOFRAN ) 4 MG tablet, Take 1 tablet (4 mg) by mouth every 8 (eight) hours, Disp: 90 tablet, Rfl: 0    pantoprazole  (PROTONIX ) 40 MG tablet, Take 1 tablet (40 mg) by mouth 2 (two) times daily, Disp: 30 tablet, Rfl: 1    polyethylene  glycol (MIRALAX ) 17 GM/SCOOP powder, Take 17 g by mouth daily, Disp: 90 each, Rfl: 3    senna-docusate (PERICOLACE) 8.6-50 MG per tablet, Take 2 tablets by mouth nightly, Disp: 60 tablet, Rfl: 0    SUMAtriptan  (IMITREX ) 25 MG tablet, Take 1 tablet (25 mg) by mouth every 2 (two) hours as needed for Migraine, Disp: 9 tablet, Rfl: 3    traMADol  (ULTRAM ) 50 MG tablet, Take 1 tablet (50 mg) by mouth every 4 (four) hours as needed for Pain, Disp: 28 tablet, Rfl: 0    umeclidinium bromide  (INCRUSE ELLIPTA ) 62.5 MCG/ACT Aerosol Pwdr, Breath Activated, Inhale 1 puff into the lungs daily, Disp: 1 each, Rfl: 0  [7]   Patient Active Problem List  Diagnosis    GERD (gastroesophageal reflux disease)    Lumbar spondylosis    OSA (obstructive sleep apnea)    Uterine leiomyoma    Vitamin D  deficiency    Ischemic  cerebrovascular accident (CVA) (CMS/HCC)    Severe persistent asthma without complication (CMS/HCC)    Bronchiectasis without complication (CMS/HCC)    Personal history of pneumonia (recurrent)    Chronic cough    Neuroendocrine tumor of pancreas (CMS/HCC)    Depression with anxiety    Migraines

## 2023-05-08 NOTE — Patient Instructions (Addendum)
 In addition to tramadol , try capsaicin or lidocaine  cream for pain    Make appointments with [facility]    Stop Cymbalta  and change to Lexapro      Get a mammogram done

## 2023-05-09 LAB — TSH WITH REFLEX TO FREE T4: TSH: 0.891 u[IU]/mL (ref 0.450–4.500)

## 2023-05-10 ENCOUNTER — Ambulatory Visit (INDEPENDENT_AMBULATORY_CARE_PROVIDER_SITE_OTHER): Payer: Self-pay | Admitting: Hospitalist

## 2023-05-10 NOTE — Progress Notes (Signed)
 Subjective:     Valerie May is a 53 y.o. female who first presented to the hospital on February 22 with nausea and vomiting associated with abdominal pain and altered mental status.  She has already had her gallbladder removed, but describes the pain as a right upper quadrant pain occurring after she eats oily/fatty foods.  She states that this is so severe that it makes her break out in a sweat and makes her unable to function.  As part of the evaluation a CT scan was done that demonstrated a pancreatic head mass, so she underwent an endoscopic ultrasound and biopsy on 2/26 which was indeterminant, but suspicious for neuroendocrine tumor.  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    She presented today, with her daughter, to discuss the findings and possible treatment options.    Chief Complaint:  No chief complaint on file.    Current Medications[1]  Problem List[2]  Allergies[3]  Medical History[4]  Past Surgical History[5]  Social History[6]  Family History[7]  Review of Systems   Constitutional:  Negative for chills, fever, malaise/fatigue and weight loss.   Cardiovascular:  Negative for chest pain.   Gastrointestinal:  Positive for abdominal pain (Severe postprandial pain after eating fatty/oily food). Negative for heartburn, nausea and vomiting.   Genitourinary:  Negative for dysuria, frequency and urgency.   All other systems reviewed and are negative.      Objective:     Wt Readings from Last 1 Encounters:   05/08/23 76.7 kg (169 lb)     Temp Readings from Last 1 Encounters:   05/08/23 98.2 F (36.8 C) (Oral)     BP Readings from Last 1 Encounters:   05/08/23 130/86     Pulse Readings from Last 1 Encounters:   05/08/23 96     Physical Exam  Constitutional:       General: She is not in acute distress.     Appearance:  She is well-developed.   HENT:      Head: Normocephalic and atraumatic.   Eyes:      General: No scleral icterus.     Pupils: Pupils are equal, round, and reactive to light.   Neck:      Vascular: No JVD.      Trachea: No tracheal deviation.   Cardiovascular:      Rate and Rhythm: Normal rate and regular rhythm.   Pulmonary:      Effort: Pulmonary effort is normal. No respiratory distress.   Abdominal:      General: Bowel sounds are normal. There is no distension.      Palpations: Abdomen is soft.      Tenderness: There is no abdominal tenderness.      Comments: The patient has a very large Pfannenstiel incision from her hysterectomy.  She has had 3 previous umbilical hernia repairs and there is a incision at her umbilicus that is well-healed.  She has also had a laparoscopic cholecystectomy and has port sites consistent with that procedure.   Musculoskeletal:         General: Normal range of motion.      Cervical back: Normal range of motion.   Skin:     General: Skin is warm and dry.   Neurological:      Mental Status: She is  alert and oriented to person, place, and time.   Psychiatric:         Behavior: Behavior normal.         Thought Content: Thought content normal.         Judgment: Judgment normal.         Laboratory:   Lab Results   Component Value Date    CREAT 0.6 04/13/2023    BUN 8 04/13/2023    NA 137 04/13/2023    K 4.0 04/13/2023    CL 104 04/13/2023    CO2 25 04/13/2023     Lab Results   Component Value Date    WBC 8.35 04/13/2023    HGB 13.7 04/13/2023    HCT 40.4 04/13/2023    MCV 88.0 04/13/2023    PLT 357 (H) 04/13/2023     Lab Results   Component Value Date    CA 9.5 04/13/2023    PHOS 3.5 04/12/2023     No results found for: "PTHINTACT", "IRON", "FERRITIN"  No components found for: "UTP", "UPH", "URBC", "UWBC"  No results found for: "HBSAG"  Lab Results   Component Value Date    ALB 3.7 04/12/2023     No results found for: "BKVDNAPCRQN", "RAPAMYCIN", "CMVIGM", "CMVDNAPCRQN", "CMVQUANT",  "CMVDNAQNBYRA", "CMVDNAPCRQL", "EBV", "EBVPCR", "EBVDNAQUANPC", "EBVDNAQUALPC", "EBVVCAABIGM", "EBVIGM", "EBVVCAIGG"  Lab Results   Component Value Date    MG 2.1 04/12/2023     No results found for: "FK506"  No components found for: "GFRNONAFRAM", "GFRAFRAM"  No components found for: "VITD25"  No results found for: "DDIMER"      Assessment:   No diagnosis found.  Reviewing the MRI scan dated February 25 and the CT scan dated March 13 we see that she does have a 1.2 x 1.4 cm well-circumscribed mass in the head of the pancreas.  On the MRI scan this appears to be sitting right at the confluence of the pancreatic and biliary duct in the head of the pancreas near the ampulla.    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    Active problem list:   Problem List[8]    Plan:   No follow-ups on file.  No orders of the defined types were placed in this encounter.      Discussion:   I had a 45-minute conversation with the patient and her daughter concerning the fact surrounding her situation.  We first impressed upon them that the cause of her pain was not this tumor.  The tumor was identified when trying to determine the underlying etiology of the pain, but the 2 are not related.    We next discussed the fact that neuroendocrine tumors are considered to be cancerous, but the majority of these tumors behave in a very indolent manner.  The best way to determine whether these will behave in an indolent manner or in an aggressive manner is the Ki-67 index.  Her tumor was well-differentiated, a good finding, and had a very low Ki-67 index of < 3%.  These findings suggest that the risk of rapid progression in the near future is small.    Unfortunately, given the fact that this is sitting right in the junction of the common bile duct and pancreatic duct that the only way to remove it would be a Whipple procedure.  We did discuss the  Whipple procedure including the risks including the risk of  mortality, delayed gastric emptying, pancreatic leak as well as time to full recovery.  Because this is such a big surgery, it is not something we want to rush into, though would be the only way to completely eradicate the tumor that was found.    Because this is not contributing to her pain, we suggested that the most important thing at this time would be to get a much more thorough evaluation to try and determine the etiology of her postprandial pain.  We will get her to see a gastroenterologist will also order a gastric emptying study as she has a type II diabetic and gastroparesis could be one possible option.    Once her pain is under control, then we can discuss how best to proceed.  If she does not wish to have any surgery, then follow-up imaging every 6 months for the next year or so followed by annual imaging would be 1 option.  If there are any changes that are seen then a repeat endoscopic ultrasound and biopsy could be performed.  The other option would be to move forward with the surgical resection (Whipple procedure).    They seem to understand everything that we have discussed and we will help arrange the gastroenterology appointment in the gastric emptying study.           [1]   Current Outpatient Medications   Medication Sig Dispense Refill    acetaminophen  (TYLENOL ) 650 MG CR tablet TAKE 1 TABLET BY MOUTH EVERY 8 HOURS AS NEEDED FOR PAIN (Patient taking differently: every 8 (eight) hours as needed) 90 tablet 1    albuterol  sulfate HFA (PROVENTIL ) 108 (90 Base) MCG/ACT inhaler Inhale 1-2 puffs into the lungs every 4 (four) hours as needed      benzonatate  (TESSALON ) 200 MG capsule Take 1 capsule (200 mg) by mouth 3 (three) times daily as needed for Cough      Budeson-Glycopyrrol-Formoterol  160-9-4.8 MCG/ACT Aerosol Inhale 2 puffs into the lungs 2 (two) times daily 10.7 g 5    budesonide -formoterol  (SYMBICORT ) 160-4.5 MCG/ACT inhaler Inhale  2 puffs into the lungs 2 (two) times daily 1 each 0    cetirizine  (ZyrTEC ) 10 MG tablet Take 1 tablet (10 mg) by mouth daily      diphenhydrAMINE -APAP, sleep, (Excedrin PM) 38-500 MG Tab Take by mouth daily      DULoxetine  (CYMBALTA ) 30 MG capsule Take 1 capsule (30 mg) by mouth daily for 14 days, THEN 2 capsules (60 mg) daily. 194 capsule 0    ergocalciferol  (ERGOCALCIFEROL ) 1.25 MG (50000 UT) capsule Take 1 capsule (50,000 Units) by mouth once a week 12 capsule 1    escitalopram  (LEXAPRO ) 10 MG tablet Take 1 tablet (10 mg) by mouth daily 30 tablet 5    metoclopramide  (REGLAN ) 5 MG tablet Take 1 tablet (5 mg) by mouth 3 (three) times daily before meals 90 tablet 0    montelukast  (SINGULAIR ) 10 MG tablet Take 1 tablet (10 mg) by mouth nightly 90 tablet 3    ondansetron  (ZOFRAN ) 4 MG tablet Take 1 tablet (4 mg) by mouth every 8 (eight) hours 90 tablet 0    pantoprazole  (PROTONIX ) 40 MG tablet Take 1 tablet (40 mg) by mouth 2 (two) times daily 30 tablet 1    polyethylene glycol (MIRALAX ) 17 GM/SCOOP powder Take 17 g by mouth daily 90 each 3    senna-docusate (PERICOLACE) 8.6-50 MG per tablet Take 2 tablets by mouth nightly 60 tablet 0    SUMAtriptan  (IMITREX )  25 MG tablet Take 1 tablet (25 mg) by mouth every 2 (two) hours as needed for Migraine 9 tablet 3    traMADol  (ULTRAM ) 50 MG tablet Take 1 tablet (50 mg) by mouth every 4 (four) hours as needed for Pain 28 tablet 0    umeclidinium bromide  (INCRUSE ELLIPTA ) 62.5 MCG/ACT Aerosol Pwdr, Breath Activated Inhale 1 puff into the lungs daily 1 each 0     No current facility-administered medications for this visit.   [2]   Patient Active Problem List  Diagnosis    GERD (gastroesophageal reflux disease)    Lumbar spondylosis    OSA (obstructive sleep apnea)    Vitamin D  deficiency    History of CVA (cerebrovascular accident)    Asthma    Bronchiectasis without complication (CMS/HCC)    Personal history of pneumonia (recurrent)    Chronic cough    Neuroendocrine tumor of  pancreas (CMS/HCC)    Depression with anxiety    Migraines    Eosinophilia    Prediabetes   [3]   Allergies  Allergen Reactions    Benadryl  [Diphenhydramine ]     Dust Mite Extract     Methocarbamol Itching    Molds & Smuts     Other     Compazine [Prochlorperazine] Anxiety   [4]   Past Medical History:  Diagnosis Date    COVID-19 2021    Kidney stone     OSA (obstructive sleep apnea)     Pneumonia     Uterine leiomyoma 10/11/2017   [5]   Past Surgical History:  Procedure Laterality Date    CYST REMOVAL      ESOPHAGOGASTRODUODENOSCOPY (EGD), ESOPHAGEAL ULTRASOUND (EUS) LIMITED TO THE ESOPHAGUS, STOMACH OR DUODENUM N/A 04/10/2023    Procedure: ESOPHAGOGASTRODUODENOSCOPY (EGD), ESOPHAGEAL ULTRASOUND (EUS) LIMITED TO THE ESOPHAGUS, STOMACH OR DUODENUM;  Surgeon: Werner Hamlet, MD;  Location: [id number];  Service: Gastroenterology;  Laterality: N/A;    ESOPHAGOGASTRODUODENOSCOPY (EGD), ESOPHAGEAL ULTRASOUND (EUS) WITH FINE NEEDLE ASPIRATION/BIOPSY OF ESOPHAGUS, STOMACH, OR DUODENUM N/A 03/25/2023    Procedure: ESOPHAGOGASTRODUODENOSCOPY (EGD), ESOPHAGEAL ULTRASOUND (EUS) WITH FINE NEEDLE ASPIRATION/BIOPSY OF ESOPHAGUS, STOMACH, OR DUODENUM;  Surgeon: Werner Hamlet, MD;  Location: [id number];  Service: Gastroenterology;  Laterality: N/A;    ESOPHAGOGASTRODUODENOSCOPY (EGD), ESOPHAGEAL ULTRASOUND (EUS) WITH FINE NEEDLE ASPIRATION/BIOPSY OF ESOPHAGUS, STOMACH, OR DUODENUM N/A 04/03/2023    Procedure: ESOPHAGOGASTRODUODENOSCOPY (EGD), ESOPHAGEAL ULTRASOUND (EUS) WITH FINE NEEDLE ASPIRATION/BIOPSY OF ESOPHAGUS, STOMACH, OR DUODENUM;  Surgeon: Wetzel Hammock, MD;  Location: [facility];  Service: Gastroenterology;  Laterality: N/A;    GALLBLADDER SURGERY      HERNIA REPAIR      HYSTERECTOMY      TUBAL LIGATION     [6]   Social History  Socioeconomic History    Marital status: Single   Tobacco Use    Smoking status: Never     Passive exposure: Current    Smokeless tobacco: Never   Vaping Use    Vaping status: Never  Used   Substance and Sexual Activity    Alcohol  use: Not Currently    Drug use: Never    Sexual activity: Not Currently     Social Drivers of Health     Financial Resource Strain: High Risk (04/20/2023)    Overall Financial Resource Strain (CARDIA)     Difficulty of Paying Living Expenses: Very hard   Food Insecurity: Food Insecurity Present (04/20/2023)    Hunger Vital Sign     Worried About Running Out of  Food in the Last Year: Often true     Ran Out of Food in the Last Year: Often true   Transportation Needs: No Transportation Needs (04/20/2023)    PRAPARE - Therapist, art (Medical): No     Lack of Transportation (Non-Medical): No   Recent Concern: Transportation Needs - Unmet Transportation Needs (04/09/2023)    PRAPARE - Transportation     Lack of Transportation (Medical): Yes     Lack of Transportation (Non-Medical): Yes   Physical Activity: Inactive (04/20/2023)    Exercise Vital Sign     Days of Exercise per Week: 0 days     Minutes of Exercise per Session: 0 min   Stress: Stress Concern Present (04/20/2023)    [employer] of Occupational Health - Occupational Stress Questionnaire     Feeling of Stress : To some extent   Social Connections: Moderately Isolated (04/20/2023)    Social Connection and Isolation Panel [NHANES]     Frequency of Communication with Friends and Family: More than three times a week     Frequency of Social Gatherings with Friends and Family: Once a week     Attends Religious Services: 1 to 4 times per year     Active Member of [employer] or Organizations: No     Attends Banker Meetings: Patient declined     Marital Status: Divorced   Catering manager Violence: Not At Risk (04/20/2023)    Humiliation, Afraid, Rape, and Kick questionnaire     Fear of Current or Ex-Partner: No     Emotionally Abused: No     Physically Abused: No     Sexually Abused: No   Housing Stability: High Risk (04/20/2023)    Housing Stability Vital Sign     Unable to Pay for Housing  in the Last Year: Yes     Number of Times Moved in the Last Year: 2     Homeless in the Last Year: Yes   [7]   Family History  Problem Relation Name Age of Onset    Heart attack Mother      Diabetes Father      Heart attack Maternal Grandfather      Heart attack Paternal Grandfather      Stroke Son      Heart attack Maternal Uncle      Stomach cancer Cousin      Bone cancer Maternal Aunt      Colon cancer Neg Hx      Breast cancer Neg Hx     [8]   Patient Active Problem List  Diagnosis    GERD (gastroesophageal reflux disease)    Lumbar spondylosis    OSA (obstructive sleep apnea)    Vitamin D  deficiency    History of CVA (cerebrovascular accident)    Asthma    Bronchiectasis without complication (CMS/HCC)    Personal history of pneumonia (recurrent)    Chronic cough    Neuroendocrine tumor of pancreas (CMS/HCC)    Depression with anxiety    Migraines    Eosinophilia    Prediabetes

## 2023-05-11 ENCOUNTER — Ambulatory Visit: Attending: Surgery | Admitting: Hematology & Oncology

## 2023-05-11 ENCOUNTER — Encounter: Payer: Self-pay | Admitting: Surgery

## 2023-05-11 ENCOUNTER — Ambulatory Visit (INDEPENDENT_AMBULATORY_CARE_PROVIDER_SITE_OTHER): Admitting: Surgery

## 2023-05-11 ENCOUNTER — Other Ambulatory Visit: Payer: Self-pay

## 2023-05-11 VITALS — BP 130/85 | HR 101 | Temp 98.0°F | Resp 16 | Ht 59.0 in | Wt 170.0 lb

## 2023-05-11 DIAGNOSIS — D3A8 Other benign neuroendocrine tumors: Secondary | ICD-10-CM

## 2023-05-11 DIAGNOSIS — R112 Nausea with vomiting, unspecified: Secondary | ICD-10-CM

## 2023-05-11 DIAGNOSIS — K3184 Gastroparesis: Secondary | ICD-10-CM

## 2023-05-11 DIAGNOSIS — C7A8 Other malignant neuroendocrine tumors: Secondary | ICD-10-CM

## 2023-05-11 NOTE — Progress Notes (Signed)
 Subjective:      Oncology History    No history exists.     HPI:  Valerie May is a 53 y.o. female who presents today to establish with medical oncology following recent diagnosis of pancreatic neuroendocrine tumor. Patient states that she began having nausea multiple times a day leading to vomiting 50% of the time one year ago. She went to see her PCP and was prescribed zofran , which she was taking 4-5 times a day before meals alongside alka-seltzer. She states that this helped with her symptoms until three months ago when the nausea and vomit became more persistent. She endorses intermittent RUQ abdominal pain after fatty foods but began having diffuse abdominal pain around February that led her to present to the ED on February 22nd for evaluation.     In the ED, she had a CT performed that demonstrated a pancreatic head mass. She was subsequently admitted and had a EUS with biopsy on 03/25/2023 that was suspicious for a neuroendocrine tumor. She opted to leave the hospital on March 11th but returned on March 13th due to nausea and diffuse abdominal pain. Repeat biopsy performed on 04/10/2023 confirmed the presence of a well-differentiated neuroendocrine tumor. She had plans to see GI today but states that she could not make both appointments and come to this one. Endorses     Review of Systems   General ROS: negative for fatigue or weight loss or anorexia  Gastrointestinal ROS: negative for constipation, diarrhea or nausea/vomiting  Cardiovascular ROS: No chest pain   Skeleton: Negative for difficulty with ambulation or focal bone pain    RELEVANT MEDICAL HISTORY    PAST MEDICAL HISTORY  Medical History[1]    PAST SURGICAL HISTORY  Past Surgical History[2]    SOCIAL HISTORY  Social History[3]     FAMILY HISTORY  Family History[4]    ALLERGY:  Allergies[5]         Objective:     Vitals:    05/11/23 0938   BP: 130/85   Pulse: (!) 101   Resp: 16   Temp: 98 F (36.7 C)   SpO2: 94%     Wt Readings from Last 3  Encounters:   05/11/23 77.2 kg (170 lb 4.8 oz)   05/08/23 76.7 kg (169 lb)   04/28/23 80.3 kg (177 lb)     Neck - supple, no palpable lymphadenopathy  Chest - clear to auscultation, no wheezes, rales or rhonchi, symmetric air entry  Heart - normal rate, regular rhythm, normal S1, S2, no murmurs, rubs, clicks or gallops  Abdomen - soft, nontender, nondistended, no masses or organomegaly, Lower abdominal surgical scar  Extremities - peripheral pulses normal, no pedal edema, no clubbing or cyanosis,   Skin - normal coloration and turgor, no rashes, no suspicious skin lesions noted     Rads:     PET/CT Neuroendocrine Tumor Loc(Ga-68 Dotatate)  Result Date: 05/07/2023  IMPRESSION: 1.  Approximately 1.4 cm sized DOTATATE avid focus in the pancreas head, consistent with somatostatin receptor positive neoplasm. 2. No evidence for locoregional metastatic lymphadenopathy. No suspicious DOTATATE activity elsewhere to suggest distant metastasis. PQRS: Z6109 There are no calcifications noted along the coronary arteries, (ACRad 36-). PQRS: U0454 Electronically signed by Salvatore Creeks, MD on 05/07/2023 2:43 PM    XR Abdomen Portable  Result Date: 04/13/2023  1.Moderate colonic stool burden, similar compared to prior study, likely related to constipation. 2.Nonobstructive bowel gas pattern. Maritza Sidles, MD 04/13/2023 5:02 PM  XR Hip right 2-3 vw with pelvis  Result Date: 04/13/2023  1. No fracture of the right hip or pelvis. However, assessment for nondisplaced fracture is limited. If there is persistent pain and difficulty weightbearing, then further assessment with MRI or CT would be recommended to assess for occult fracture. 2. Lumbar spondylosis. Francy Ishikawa, MD 04/13/2023 9:35 AM         Assessment:       Valerie May is a 53 y.o. female with a pertinent pmh of GERD and T2DM who presents today to establish with medical oncology for 1.2 x 1.4 cm well-differentiated NET of the pancreas.         Plan:       #Well differentiated  pancreatic NET  - Evaluation wit Dr. Sanna Crystal for potential surgical resection   - If patient does not wish to proceed with surgery, imaging q6 months - 1 year for surveillance    #Nausea and Vomiting  - Schedule follow up evaluation with GI for gastric emptying study    Joselyne Tessa Tonleu, BA MS4    I have seen and examined the patient and agree with the history, findings, assessment and plan as documented above. I have edited the note according to my own findings and recommendations. I answered all of the patient's questions.     Eve Hinders, MD  Gastrointestinal Oncology  [facility]          [1]   Past Medical History:  Diagnosis Date    COVID-19 2021    Kidney stone     OSA (obstructive sleep apnea)     Pneumonia     Uterine leiomyoma 10/11/2017   [2]   Past Surgical History:  Procedure Laterality Date    CYST REMOVAL      ESOPHAGOGASTRODUODENOSCOPY (EGD), ESOPHAGEAL ULTRASOUND (EUS) LIMITED TO THE ESOPHAGUS, STOMACH OR DUODENUM N/A 04/10/2023    Procedure: ESOPHAGOGASTRODUODENOSCOPY (EGD), ESOPHAGEAL ULTRASOUND (EUS) LIMITED TO THE ESOPHAGUS, STOMACH OR DUODENUM;  Surgeon: Werner Hamlet, MD;  Location: [id number];  Service: Gastroenterology;  Laterality: N/A;    ESOPHAGOGASTRODUODENOSCOPY (EGD), ESOPHAGEAL ULTRASOUND (EUS) WITH FINE NEEDLE ASPIRATION/BIOPSY OF ESOPHAGUS, STOMACH, OR DUODENUM N/A 03/25/2023    Procedure: ESOPHAGOGASTRODUODENOSCOPY (EGD), ESOPHAGEAL ULTRASOUND (EUS) WITH FINE NEEDLE ASPIRATION/BIOPSY OF ESOPHAGUS, STOMACH, OR DUODENUM;  Surgeon: Werner Hamlet, MD;  Location: [id number];  Service: Gastroenterology;  Laterality: N/A;    ESOPHAGOGASTRODUODENOSCOPY (EGD), ESOPHAGEAL ULTRASOUND (EUS) WITH FINE NEEDLE ASPIRATION/BIOPSY OF ESOPHAGUS, STOMACH, OR DUODENUM N/A 04/03/2023    Procedure: ESOPHAGOGASTRODUODENOSCOPY (EGD), ESOPHAGEAL ULTRASOUND (EUS) WITH FINE NEEDLE ASPIRATION/BIOPSY OF ESOPHAGUS, STOMACH, OR DUODENUM;  Surgeon: Wetzel Hammock, MD;  Location:  [id number];  Service: Gastroenterology;  Laterality: N/A;    GALLBLADDER SURGERY      HERNIA REPAIR      HYSTERECTOMY      TUBAL LIGATION     [3]   Social History  Socioeconomic History    Marital status: Single   Tobacco Use    Smoking status: Never     Passive exposure: Current    Smokeless tobacco: Never   Vaping Use    Vaping status: Never Used   Substance and Sexual Activity    Alcohol  use: Not Currently    Drug use: Never    Sexual activity: Not Currently     Social Drivers of Health     Financial Resource Strain: High Risk (04/20/2023)    Overall Financial Resource Strain (CARDIA)     Difficulty of Paying Living  Expenses: Very hard   Food Insecurity: Food Insecurity Present (04/20/2023)    Hunger Vital Sign     Worried About Running Out of Food in the Last Year: Often true     Ran Out of Food in the Last Year: Often true   Transportation Needs: No Transportation Needs (04/20/2023)    PRAPARE - Therapist, art (Medical): No     Lack of Transportation (Non-Medical): No   Recent Concern: Transportation Needs - Unmet Transportation Needs (04/09/2023)    PRAPARE - Transportation     Lack of Transportation (Medical): Yes     Lack of Transportation (Non-Medical): Yes   Physical Activity: Inactive (04/20/2023)    Exercise Vital Sign     Days of Exercise per Week: 0 days     Minutes of Exercise per Session: 0 min   Stress: Stress Concern Present (04/20/2023)    [employer] of Occupational Health - Occupational Stress Questionnaire     Feeling of Stress : To some extent   Social Connections: Moderately Isolated (04/20/2023)    Social Connection and Isolation Panel [NHANES]     Frequency of Communication with Friends and Family: More than three times a week     Frequency of Social Gatherings with Friends and Family: Once a week     Attends Religious Services: 1 to 4 times per year     Active Member of [employer] or Organizations: No     Attends Banker Meetings: Patient declined      Marital Status: Divorced   Catering manager Violence: Not At Risk (04/20/2023)    Humiliation, Afraid, Rape, and Kick questionnaire     Fear of Current or Ex-Partner: No     Emotionally Abused: No     Physically Abused: No     Sexually Abused: No   Housing Stability: High Risk (04/20/2023)    Housing Stability Vital Sign     Unable to Pay for Housing in the Last Year: Yes     Number of Times Moved in the Last Year: 2     Homeless in the Last Year: Yes   [4]   Family History  Problem Relation Name Age of Onset    Heart attack Mother      Diabetes Father      Heart attack Maternal Grandfather      Heart attack Paternal Grandfather      Stroke Son      Heart attack Maternal Uncle      Stomach cancer Cousin      Bone cancer Maternal Aunt      Colon cancer Neg Hx      Breast cancer Neg Hx     [5]   Allergies  Allergen Reactions    Benadryl  [Diphenhydramine ]     Dust Mite Extract     Methocarbamol Itching    Molds & Smuts     Other     Compazine [Prochlorperazine] Anxiety

## 2023-05-14 ENCOUNTER — Encounter (INDEPENDENT_AMBULATORY_CARE_PROVIDER_SITE_OTHER): Payer: Self-pay

## 2023-05-14 NOTE — Progress Notes (Signed)
 Referral placed by:   4/14: ChiDonata Fryer, RN, Oncology, [facility]    Referral:  Ms. Balicki was seen by our oncology team for her initial visit today and before deciding on a treatment for her newly diagnosed pancreatic neuroendocrine tumor, we would like her to be seen urgently by GI. She was recently admitted at [facility] for epigastric pain, nausea/vomiting and inability to tolerate PO intake. 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 She needs a gastric emptying studying performed by GI to help with her acute, ongoing postprandial pain (suspected to be gastroparesis) before we can consider surgery vs surveillance for her cancer. Would you please help with setting her up for a consultation? I have placed the urgent referral order into Epic.    Navigator Action:  4/17: Patient has an established appointment with NP Edwina Gram for 4/30.  Sent message to provider letting him know about the appointment. Sent message to NP Edwina Gram to include recent finding of pancreatic lesion since appointment booking.     LABS:       ast/alt/t/bili./alp:  3/16: 37 / 57 / 0.4 / 108       pt/inr:  3/14: 13.2 / 1.2    Procedures:  3/14: EGD  3/14: EUS     STENTS:     Chart Review:   Type: HOSPFU  Location: [facility]   Dx: epigastric pain, nausea/vomiting,pancreatic lesion   Procedures: 3/14:EGD&EUS  Timeframe: 1-2 weeks   Relationship: Non. [employer]   Insurance: [employer]    Lindi Revering MSN, BSN, RN  Gastroenterology Patient Care Navigator  [facility]

## 2023-05-14 NOTE — Progress Notes (Unsigned)
 [facility] - Social Services Follow-Up Assessment    TIME IN:                                         TIME OUT:     Interpreter Used [Y/N]:       Reason for Referral:         History of Present Illness:       Mental Status Exam:  General Appearance:    Attitude:   Psychomotor Behavior:   Speech:   Mood:  Affect:   Thought Process:   Thought Content:   Insight:   Judgment:  Cognition:       Summary:    In case of emergency, patient is advised to call 911 or go to the nearest emergency room. Patient is also advised to call friends, family members, and [facility] for support.    Plan/Follow-Up:               Gwenevere Ghazi  Behavioral Health Therapist I  [facility]  [address] [place], [place] [address]

## 2023-05-15 ENCOUNTER — Telehealth (INDEPENDENT_AMBULATORY_CARE_PROVIDER_SITE_OTHER): Payer: Self-pay

## 2023-05-15 ENCOUNTER — Ambulatory Visit (INDEPENDENT_AMBULATORY_CARE_PROVIDER_SITE_OTHER)

## 2023-05-15 NOTE — Telephone Encounter (Signed)
 Patient no showed for appointment with [facility] today. Team member spoke with patient to reschedule appointment. Patient stated she was on her way to the appointment at 1:47 but then thought she was late to the appointment and ended up not showing up. Patient rescheduled for Friday, 05/29/23 at 2:00 PM with [facility] Felicia Caulk.     Gayle Kava.  Patient Access Associate III   [facility]  [address], [address] [place] [address]  Phone: [phone]   Fax: [phone]

## 2023-05-19 ENCOUNTER — Encounter (INDEPENDENT_AMBULATORY_CARE_PROVIDER_SITE_OTHER): Payer: Self-pay

## 2023-05-20 ENCOUNTER — Other Ambulatory Visit: Payer: Self-pay

## 2023-05-20 ENCOUNTER — Ambulatory Visit (INDEPENDENT_AMBULATORY_CARE_PROVIDER_SITE_OTHER): Admitting: Family

## 2023-05-21 ENCOUNTER — Ambulatory Visit (INDEPENDENT_AMBULATORY_CARE_PROVIDER_SITE_OTHER)

## 2023-05-27 ENCOUNTER — Inpatient Hospital Stay (INDEPENDENT_AMBULATORY_CARE_PROVIDER_SITE_OTHER): Admitting: Family Nurse Practitioner

## 2023-05-27 NOTE — Progress Notes (Unsigned)
 [facility]         Subjective     History of Present Illness      Review of Systems    Objective   LMP 03/18/2012   Physical Exam  Physical Exam       Results  GI Procedures:  April 10, 2023  EGD  - Normal stomach. - Normal examined duodenum. - No specimens collected  EUS  - A mass was identified in the pancreatic head. The staging applies if malignancy is confirmed. Fine needle aspiration performed. - There was no sign of significant pathology in the common bile duct. - Evidence of a cholecystectomy  Pathology:    March 25, 2023  EGD  - Esophageal plaques were found, suspicious for candidiasis. Cells for cytology obtained. - Normal examined duodenum. - Gastritis  Pathology:  EUS  - A mass was identified in the pancreatic head. Fine needle biopsy performed.  Pathology:    Lab Work & Imaging:  May 07, 2023  PET/CT  1.  Approximately 1.4 cm sized DOTATATE avid focus in the pancreas head, consistent with somatostatin receptor positive neoplasm.   2. No evidence for locoregional metastatic lymphadenopathy. No suspicious DOTATATE activity elsewhere to suggest distant metastasis.     HEAD AND NECK:   No cervical lymphadenopathy.   No parenchymal lesions, mass effect, or hemorrhage within the visualized brain.   The paranasal sinuses and mastoid air cells are well aerated. Normal salivary glands. The cervical aerodigestive tract is unremarkable.  The thyroid  gland appears normal.       THORAX:   Subcentimeter-sized subcarinal and mediastinal lymph nodes exhibit very mild dotatate activity, for example subcarinal lymph node with SUV max 2.6. Findings are favored to represent reactive change.     No suspicious lung mass or nodules. Focal parenchymal scarring in the anterior aspect of bilateral upper lobes, favored to represent sequela from inflammatory/infectious process. No parenchymal consolidation. No pleural effusion or pneumothorax. The heart is normal in size. No pericardial  effusion.       ABDOMEN/PELVIS:     There is approximately 1.4 cm sized soft tissue lesion in the pancreas head with associated discrete dotatate activity (SUV max 15). No abdominopelvic lymphadenopathy.     No focal hepatic lesion.   No splenomegaly.   No adrenal nodules.   No suspicious renal masses. No urinary tract dilation or calculi.   No abnormal bowel distention or wall thickening. No free fluid.       Assessment/Plan     Assessment & Plan      Verbal consent obtained to record this visit.

## 2023-05-28 ENCOUNTER — Ambulatory Visit (INDEPENDENT_AMBULATORY_CARE_PROVIDER_SITE_OTHER)

## 2023-05-28 ENCOUNTER — Ambulatory Visit (INDEPENDENT_AMBULATORY_CARE_PROVIDER_SITE_OTHER): Admitting: Family

## 2023-05-28 ENCOUNTER — Encounter (INDEPENDENT_AMBULATORY_CARE_PROVIDER_SITE_OTHER): Payer: Self-pay | Admitting: Family

## 2023-05-28 VITALS — BP 116/75 | HR 103

## 2023-05-28 DIAGNOSIS — M545 Low back pain, unspecified: Secondary | ICD-10-CM

## 2023-05-28 MED ORDER — CELECOXIB 100 MG PO CAPS
100.0000 mg | ORAL_CAPSULE | Freq: Two times a day (BID) | ORAL | 0 refills | Status: AC
Start: 2023-05-28 — End: 2023-06-09

## 2023-05-28 MED ORDER — GABAPENTIN 300 MG PO CAPS
300.0000 mg | ORAL_CAPSULE | Freq: Every evening | ORAL | 1 refills | Status: AC
Start: 2023-05-28 — End: 2023-09-24

## 2023-05-28 NOTE — Progress Notes (Unsigned)
 [facility] - Social Services Follow-Up Assessment    TIME IN:                                         TIME OUT:     Interpreter Used [Y/N]:       Reason for Referral:         History of Present Illness:       Mental Status Exam:  General Appearance:    Attitude:   Psychomotor Behavior:   Speech:   Mood:  Affect:   Thought Process:   Thought Content:   Insight:   Judgment:  Cognition:       Summary:    In case of emergency, patient is advised to call 911 or go to the nearest emergency room. Patient is also advised to call friends, family members, and [facility] for support.    Plan/Follow-Up:               Gwenevere Ghazi  Behavioral Health Therapist I  [facility]  [address] [place], [place] [address]

## 2023-05-28 NOTE — Progress Notes (Signed)
 [facility]   Reynaldo Cavalier   [phone]  PROGRESS NOTE    Date Time: 05/28/2023  3:04 PM  Patient Name: Valerie May  Referring Provider: PCP    Chief Complaint     Chief Complaint   Patient presents with    Neck Pain     Neck pain       HPI:   Valerie May is a 53 y.o. female who has a past medical history of  has a past medical history of COVID-19 (2021), Kidney stone, OSA (obstructive sleep apnea), Pneumonia, and Uterine leiomyoma (10/11/2017)., presents to the office for evaluation of chronic low back pain with right radiculopathy.    Saw PCP who referred to ortho and pain management  - reported no response with ESI in the past  - MRI lumbar spine in 09/2022  - went to [facility] 05/05/23 - flexeril 10 mg, indomethacin 50 mg  - rx'May tramadol  50 mg about 1 month ago    Spanish speaking only - [place] interpreter services utilized but stopped utilizing throughout the visit as she understood english well    The back pain is 60% and leg pain is 40%.   With respect to the leg pain, the RLE pain is 100% and the LLE pain is 0%. This is anterior lateral thigh and anterior shin.  The pain began approximately several years ago.    The pain is progressive and worsening.    The patient describes the pain as constant, ache, numb/tingling in nature.    Pain exacerbated by: []  any physical activity including, []  walking, []  standing, []  rising from chair, []  cough, []  bending/extending, [x]   Other: everything    Pain improves with: [x]   resting, []  laying down, []  sitting down, []  bending, []  Extending, []   Other: n/a     Prior treatments include:    [x]  Non-steroidal anti-inflammatory medications  [x]  Tylenol   []  Physical therapy  []  Chiropractor  []  Epidural steroid injections  []  Bracing  []  Steroids  [x]  Muscle Relaxer - allergy to methocarbamol  [x]  Gabapentin  - reports she has tried this in the past without improvement  []  Ice  []  Heat  [x]  Other: tramadol     Patient denies any fever, dysuria,  incontinence, or other bowel/bladder dysfunction.  Patient denies myelopathic symptoms    Patient denies any history of prior spinal surgery.    Oswestry Score:  - Date: 05/28/23 - Back 45/50  - Date: 05/28/23 - Neck 30/50    There is no height or weight on file to calculate BMI.    PMH:     Past Medical History:   Diagnosis Date    COVID-19 2021    Kidney stone     OSA (obstructive sleep apnea)     Pneumonia     Uterine leiomyoma 10/11/2017       PSH:    has a past surgical history that includes Gallbladder surgery; Hernia repair; Tubal ligation; Cyst Removal; Hysterectomy; ESOPHAGOGASTRODUODENOSCOPY (EGD), ESOPHAGEAL ULTRASOUND (EUS) WITH FINE NEEDLE ASPIRATION/BIOPSY OF ESOPHAGUS, STOMACH, OR DUODENUM (N/A, 03/25/2023); ESOPHAGOGASTRODUODENOSCOPY (EGD), ESOPHAGEAL ULTRASOUND (EUS) WITH FINE NEEDLE ASPIRATION/BIOPSY OF ESOPHAGUS, STOMACH, OR DUODENUM (N/A, 04/03/2023); and ESOPHAGOGASTRODUODENOSCOPY (EGD), ESOPHAGEAL ULTRASOUND (EUS) LIMITED TO THE ESOPHAGUS, STOMACH OR DUODENUM (N/A, 04/10/2023).    Allergies:   Allergies[1]    Medications:   Current Medications[2]    Social Hx:     Social History     Occupational History    Not on file   Tobacco  Use    Smoking status: Never     Passive exposure: Current    Smokeless tobacco: Never   Vaping Use    Vaping status: Never Used   Substance and Sexual Activity    Alcohol  use: Not Currently    Drug use: Never    Sexual activity: Not Currently       Family Hx:   Family History[3]   If not otherwise specified above means noncontributory.     ROS   General: negative for fever, chills, lethargy  Ophthalmic: negative for decreased, blurry or double vision  ENT: negative for epistaxis, headaches, nasal discharge  Respiratory: negative for cough, pleuritic pain, shortness of breath,   Cardiovascular: negative for chest pain, edema, loss of consciousness, palpitations,  Gastrointestinal: negative for abdominal pain, nausea, vomiting, change in oral intake  Genitourinary: negative  for pelvic pain or dysuria, urinary incontinence   Musculoskeletal: Per HPI  Neurological: negative for numbness, tingling, weakness, or visual changes  Dermatological: negative for rash    Physical Exam   BP 116/75 (BP Site: Left arm, Patient Position: Sitting)   Pulse (!) 103   LMP 03/18/2012     Appearance:  Well kept, normally developed  Psych:  Alert and oriented x3, normal mood and affect  Eyes:  Anicteric  Cardiovascular:  No lower extremity edema, extremities warm  Respiratory:  Breathing unlabored  Skin: no rashes   Musculoskeletal:   The patient also has good range of motion of the cervical spine, shoulders and elbows.  Gait: intact  Hoffmanns': negative   Tinel's: negative  Spurling's: not tested   Upper extremity pulses are symmetric.  Hands are warm and well perfused.  Motor Exam UE:     Deltoid Biceps Triceps Wrist Ext Wrist Flex Finger Ext Intrinsic Hand   Right 5 5 5 5 5 5 5    Left 5 5 5 5 5 5 5      The patient tolerates reasonable flexion and extension of the lumbar spine.   There were no dermatologic abnormalities over the back.   SLR: negative  TTP: right iliolumbar, left iliolumbar  There is good range of motion of the hips and knees bilaterally without pain. The calves are nontender and the pulses are symmetric. Feet are warm and well perfused.    Motor Exam LE:     Psoas GM Quads Hamst Abd Add TA EHL Gastroc   Right 5 5 5 5 5 5 5 5 5    Left 5 5 5 5 5 5 5 5 5      Deep Tendon Reflexes:  2/2 Left knee, 2/2 Right Knee, 2/2 Left ankle, 2/2 Right ankle, 2/2 Left bicep, 2/2 Right bicep, and 2/2 Left tricep, 2/2 Right tricep.     Neurologic: Sensation intact to light touch bilateral upper & lower extremities except for numbness of anterior shin right. No clonus.    Review of Imaging Studies     PET/CT Neuroendocrine Tumor Loc(Ga-68 Dotatate)  Result Date: 05/07/2023  IMPRESSION: 1.  Approximately 1.4 cm sized DOTATATE avid focus in the pancreas head, consistent with somatostatin receptor positive  neoplasm. 2. No evidence for locoregional metastatic lymphadenopathy. No suspicious DOTATATE activity elsewhere to suggest distant metastasis. PQRS: Z6109 There are no calcifications noted along the coronary arteries, (ACRad 36-). 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 female    Searria was seen today for neck pain.    Diagnoses and all orders for this visit:    Low back pain radiating to right lower extremity  -     X-ray lumbar spine AP and lateral         Plan:      Diagnosis ICD-10-CM Associated Order   1. Low back pain radiating to right lower extremity  M54.50 X-ray lumbar spine AP and lateral    M79.604           Discussion had regarding pain, symptoms and diffuse arthritis present with hx of disc bulging in the MRI  Encouraged to continue activity as tolerated  Encouraged stretches and exercises  Discussed benefit of rest, ice and heat for healing  Discussed medications, side effects and proper administration including:  - NSAID - Mobic - instructed to refrain from taking OTC NSAIDs such as ibuprofen or aleve    - Nerve Pain Modulator - Gabapentin  300 mg HS daily  - narcotic - tramadol  - encouraged sparing use - no refills  Referral for PT placed and provided to patient   Referral to pain management placed and provided to patient - encouraged to schedule appointment for possible ESI therapy  Imaging:  - Reviewed with patient today    - MRI ordered and instructions provided to patient as her pain has worsened despite therapy in the past    - F/U after MRI      Thank you for allowing me to participate in your care.    Signed by: Warnell Haagensen Mayumi Summerson, FNP,   Please feel free to call or  send a mychart for any questions:  (O)  [phone]       Due to the complex nature of this patient's problem, I spent a total of 45 minutes which was  preparing to see the patient on the day of the encounter by reviewing records and test results, including imaging review, counseling/educating the patient on maintaining a healthy weight, avoidance of smoking, pain management, ordering test including imaging studies, communications with other health professionals, coordinating patient care and documenting information in the EMR. This also included any face to face time with the patient and the time was minus any procedures that were performed during the encounter that are separately reportable.    ________________________________________________________________________________________________________________________________________________  The review of the patient's medications does not in any way constitute an endorsement, by this clinician,  of their use, dosage, indications, route, efficacy, interactions, or other clinical parameters.         [1]   Allergies  Allergen Reactions    Benadryl  [Diphenhydramine ]     Dust Mite Extract     Methocarbamol Itching    Molds & Smuts     Other     Compazine [Prochlorperazine] Anxiety   [2]   Current Outpatient Medications   Medication Sig Dispense Refill    acetaminophen  (TYLENOL ) 650 MG CR tablet TAKE 1 TABLET BY MOUTH EVERY 8 HOURS AS NEEDED FOR PAIN (Patient taking differently: every 8 (eight) hours as needed) 90 tablet 1    albuterol  sulfate HFA (PROVENTIL ) 108 (90 Base) MCG/ACT inhaler Inhale 1-2 puffs into the lungs every 4 (four) hours as needed      benzonatate  (TESSALON ) 200 MG capsule Take 1  capsule (200 mg) by mouth 3 (three) times daily as needed for Cough      Budeson-Glycopyrrol-Formoterol  160-9-4.8 MCG/ACT Aerosol Inhale 2 puffs into the lungs 2 (two) times daily 10.7 g 5    budesonide -formoterol  (SYMBICORT ) 160-4.5 MCG/ACT inhaler Inhale 2 puffs into the  lungs 2 (two) times daily 1 each 0    cetirizine  (ZyrTEC ) 10 MG tablet Take 1 tablet (10 mg) by mouth daily      diphenhydrAMINE -APAP, sleep, (Excedrin PM) 38-500 MG Tab Take by mouth daily      DULoxetine  (CYMBALTA ) 30 MG capsule Take 1 capsule (30 mg) by mouth daily for 14 days, THEN 2 capsules (60 mg) daily. 194 capsule 0    ergocalciferol  (ERGOCALCIFEROL ) 1.25 MG (50000 UT) capsule Take 1 capsule (50,000 Units) by mouth once a week 12 capsule 1    escitalopram  (LEXAPRO ) 10 MG tablet Take 1 tablet (10 mg) by mouth daily 30 tablet 5    montelukast  (SINGULAIR ) 10 MG tablet Take 1 tablet (10 mg) by mouth nightly 90 tablet 3    polyethylene glycol (MIRALAX ) 17 GM/SCOOP powder Take 17 g by mouth daily 90 each 3    senna-docusate (PERICOLACE) 8.6-50 MG per tablet Take 2 tablets by mouth nightly 60 tablet 0    SUMAtriptan  (IMITREX ) 25 MG tablet Take 1 tablet (25 mg) by mouth every 2 (two) hours as needed for Migraine 9 tablet 3    umeclidinium bromide  (INCRUSE ELLIPTA ) 62.5 MCG/ACT Aerosol Pwdr, Breath Activated Inhale 1 puff into the lungs daily 1 each 0    celecoxib (CeleBREX) 100 MG capsule Take 1 capsule (100 mg) by mouth 2 (two) times daily for 12 days 24 capsule 0    gabapentin  (NEURONTIN ) 300 MG capsule Take 1 capsule (300 mg) by mouth once nightly 30 capsule 1    pantoprazole  (PROTONIX ) 40 MG tablet Take 1 tablet (40 mg) by mouth 2 (two) times daily 30 tablet 1     No current facility-administered medications for this visit.   [3]   Family History  Problem Relation Name Age of Onset    Heart attack Mother      Diabetes Father      Heart attack Maternal Grandfather      Heart attack Paternal Grandfather      Stroke Son      Heart attack Maternal Uncle      Stomach cancer Cousin      Bone cancer Maternal Aunt      Colon cancer Neg Hx      Breast cancer Neg Hx

## 2023-05-29 ENCOUNTER — Telehealth (INDEPENDENT_AMBULATORY_CARE_PROVIDER_SITE_OTHER)

## 2023-05-29 ENCOUNTER — Telehealth (INDEPENDENT_AMBULATORY_CARE_PROVIDER_SITE_OTHER): Payer: Self-pay

## 2023-05-29 ENCOUNTER — Encounter (INDEPENDENT_AMBULATORY_CARE_PROVIDER_SITE_OTHER): Payer: Self-pay | Admitting: Family

## 2023-05-29 ENCOUNTER — Telehealth: Payer: Self-pay

## 2023-05-29 ENCOUNTER — Other Ambulatory Visit (INDEPENDENT_AMBULATORY_CARE_PROVIDER_SITE_OTHER): Payer: Self-pay | Admitting: Hospitalist

## 2023-05-29 DIAGNOSIS — M5416 Radiculopathy, lumbar region: Secondary | ICD-10-CM

## 2023-05-29 NOTE — Telephone Encounter (Signed)
 Patient no showed for telehealth appointment with [facility] on 05/29/2023. First call attempt to patient at phone number [phone] to reschedule appointment.  Unable to leave a message due to mailbox is not set up yet.    Gayle Kava.  Patient Access Associate III   [facility]  Phone: [phone]

## 2023-06-02 ENCOUNTER — Telehealth: Payer: Self-pay | Admitting: Internal Medicine

## 2023-06-02 ENCOUNTER — Ambulatory Visit

## 2023-06-04 ENCOUNTER — Ambulatory Visit (INDEPENDENT_AMBULATORY_CARE_PROVIDER_SITE_OTHER)

## 2023-06-04 NOTE — Telephone Encounter (Signed)
 RN made urgent appointment for patient due to worsening abdominal, back and leg pains.also requesting refills for tramadol  and zofran .

## 2023-06-10 ENCOUNTER — Ambulatory Visit (INDEPENDENT_AMBULATORY_CARE_PROVIDER_SITE_OTHER)

## 2023-06-11 ENCOUNTER — Encounter (INDEPENDENT_AMBULATORY_CARE_PROVIDER_SITE_OTHER): Payer: Self-pay

## 2023-06-11 NOTE — Telephone Encounter (Signed)
 Second call attempt: Unable to leave a message due to mailbox is not set up yet.    Macario RAMAN.  Patient Access Associate III   [facility]  Phone: [phone]

## 2023-06-29 ENCOUNTER — Ambulatory Visit (INDEPENDENT_AMBULATORY_CARE_PROVIDER_SITE_OTHER): Admitting: Internal Medicine

## 2023-07-02 ENCOUNTER — Ambulatory Visit

## 2023-07-09 ENCOUNTER — Inpatient Hospital Stay (INDEPENDENT_AMBULATORY_CARE_PROVIDER_SITE_OTHER): Admitting: Physician Assistant

## 2023-07-16 ENCOUNTER — Other Ambulatory Visit: Payer: Self-pay

## 2023-07-16 ENCOUNTER — Telehealth: Payer: Self-pay

## 2023-07-16 DIAGNOSIS — D3A8 Other benign neuroendocrine tumors: Secondary | ICD-10-CM

## 2023-07-16 NOTE — Telephone Encounter (Signed)
 Hello,    Could you please reach out to Ms. Graser and schedule her for a triple phase CT chest abdomen and pelvis around 10/12/23? She will also need to be scheduled for a scan review with Dr. Junita about a week later.    Thank you,  Maude

## 2023-07-17 ENCOUNTER — Telehealth: Payer: Self-pay | Admitting: Hematology & Oncology

## 2023-07-17 NOTE — Telephone Encounter (Signed)
 Good afternoon,    I spoke with Valerie May this afternoon to schedule some appts and she asked me if I could relay a message to Dr. Bolling team in regard to her pain medications. She states that Ibuprofen and Tylenol  are not helping with her pain and she is requesting to possibly having something stronger prescribed.     Thank you,    Marissa

## 2023-08-08 NOTE — ED Provider Notes (Signed)
 Emergency Department Provider Note    Chief Complaint   Patient presents with    Hand Pain     Pt ambulatory to triage c/o exacerbation of chronic back pain for which she was given Tramadol  previously; now bilateral arms & bones are painful; onset 4 days ago       History of Presenting Illness:    Valerie May is a 53 y.o. female with a past medical history of asthma, migraines, pancreatic tumor presenting to the emergency department with complaint of hand pain.    Patient states that she has been having headache, bilateral hand pain mostly in the right hand along with pain going down her right leg for the past 4 days.  She also notes substernal chest pain that radiates to her back along with shortness of breath, rhinorrhea, coughing.  Patient also notes that she has been having abdominal pain for the past 3 days.  Denies sore throat, nausea, vomiting, diarrhea, numbness, weakness.  History of buccal hernia repair, cholecystectomy, hysterectomy.    The patient came to the ED today because: they felt the symptoms could represent a medical emergency and that not being seen could end in harm    Past Medical History:   Diagnosis Date    Asthma     COVID 2021    Migraines     Pancreatic tumor        Past Surgical History:   Procedure Laterality Date    CHOLECYSTECTOMY      HERNIA REPAIR      HYSTERECTOMY         Social History     Socioeconomic History    Marital status: Divorced     Spouse name: None    Number of children: None    Years of education: None    Highest education level: None   Occupational History    None   Tobacco Use    Smoking status: Never    Smokeless tobacco: Never   Substance and Sexual Activity    Alcohol  use: Never    Drug use: Never    Sexual activity: None   Other Topics Concern    None   Social History Narrative    None       No family history on file.    No current facility-administered medications on file prior to encounter.     Current Outpatient Medications on File  Prior to Encounter   Medication Sig Dispense Refill    DULoxetine  (CYMBALTA ) 30 mg DR capsule Take 1 capsule (30 mg total) by mouth 1 (one) time each day. Do not crush or chew. 30 each 2       Allergies   Allergen Reactions    Benadryl  [Diphenhydramine  Hcl]     Compazine [Prochlorperazine]        PHYSICAL EXAM:    ED Triage Vitals [08/07/23 2058]   Temp Heart Rate Resp BP   37.1 C (98.8 F) 91 18 134/59      SpO2 Temp Source Heart Rate Source Patient Position   94 % Oral -- Lying      BP Location FiO2 (%)     Left arm --         Physical Exam  Vitals and nursing note reviewed.   HENT:      Head: Normocephalic.      Right Ear: External ear normal.      Left Ear: External ear normal.      Nose: Nose  normal.      Mouth/Throat:      Mouth: Mucous membranes are moist.   Eyes:      Extraocular Movements: Extraocular movements intact.      Conjunctiva/sclera: Conjunctivae normal.      Pupils: Pupils are equal, round, and reactive to light.   Cardiovascular:      Rate and Rhythm: Normal rate and regular rhythm.      Pulses: Normal pulses.      Comments: No murmurs rubs or gallops  Pulmonary:      Effort: Pulmonary effort is normal.      Breath sounds: Normal breath sounds.      Comments: Clear to auscultation all lung fields  Abdominal:      Palpations: Abdomen is soft.      Tenderness: There is abdominal tenderness.      Comments: Generalized tenderness to palpation.  No CVA tenderness, guarding, rebound   Musculoskeletal:         General: Tenderness present. Normal range of motion.      Cervical back: Normal range of motion.      Comments: Tenderness to palpation on the right hand and right wrist.  Full range of motion of bilateral shoulders, elbows, hips, knees, ankles   Skin:     General: Skin is warm.   Neurological:      General: No focal deficit present.      Mental Status: She is alert and oriented to person, place, and time.      Comments: NIH stroke scale 0.  No motor drift, visual field deficits, changes in  sensation, facial droop, slurred speech, vertical skew, truncal ataxia, gait ataxia, extinction.  Patient able to follow commands and is oriented to self, place, time.     Psychiatric:         Mood and Affect: Mood normal.         LAB RESULTS/IMAGING STUDIES:    Labs Reviewed   COMPREHENSIVE METABOLIC PANEL - Abnormal       Result Value    Sodium 139      Potassium 4.2      Chloride 101      CO2 27      Anion Gap 11      Glucose 110      BUN 16      Creatinine 0.54      eGFR 112      BUN/Creatinine Ratio 29.6 (*)     Calcium  10.0      AST (SGOT) 21      ALT (SGPT) 18      Alkaline Phosphatase 100      Total Protein 7.0      Albumin 4.1      Total Bilirubin 0.6     CBC WITH AUTO DIFFERENTIAL - Abnormal    WBC 10.2      RBC 5.24 (*)     Hemoglobin 15.3      Hematocrit 47.1 (*)     MCV 89.8      MCH 29.1      MCHC 32.4      RDW 13.0      Platelets 398      MPV 7.4      Neutrophils Relative 59.3      Lymphocytes Relative 25.7      Monocytes Relative 7.6      Eosinophils Relative 6.6      Basophils Relative 0.8      Neutrophils Absolute 6.10  Lymphocytes Absolute 2.60      Monocytes Absolute 0.80      Eosinophils Absolute 0.70      Basophils Absolute 0.10      NRBC 0.0      NRBC Absolute 0.01     URINALYSIS WITH REFLEX MICROSCOPIC AND CULTURE - Abnormal    Color, Urine Yellow      Clarity, Urine Clear      Specific Gravity Urine 1.018      pH, Urine 6.0      Leukocytes, Urine Negative      Nitrite, Urine Negative      Protein, Urine Negative      Glucose, Urine Negative      Ketones, Urine Negative      Urobilinogen, Urine <2.0      Bilirubin, Urine Negative      Blood, Urine Negative      RBC, Urine 5 (*)     WBC, Urine 5 (*)     Bacteria, Urine None      Squamous Epithelial, Urine Rare (*)     Mucus, Urine Present (*)    MAGNESIUM  - Normal    Magnesium  2.2     CREATINE KINASE - Normal    Total CK 112     TROPONIN I HIGH SENSITIVITY - Normal    High Sensitivity Troponin I 5     URIC ACID - Normal    Uric Acid 4.6      CBC AND DIFFERENTIAL    Narrative:     The following orders were created for panel order CBC and differential.                  Procedure                               Abnormality         Status                                     ---------                               -----------         ------                                     CBC auto differential[[id number]]       Abnormal            Final result                                                 Please view results for these tests on the individual orders.   URINALYSIS WITH REFLEX MICROSCOPIC AND CULTURE    Narrative:     The following orders were created for panel order Urinalysis with reflex microscopic and culture.                  Procedure  Abnormality         Status                                     ---------                               -----------         ------                                     Urinalysis with reflex .SABRASABRA[[id number]]  Abnormal            Final result                               Elnor urine culture ulaz[[id number]]                         Final result                                                 Please view results for these tests on the individual orders.       XR Hand 3+ Views Right   Final Result      No evidence of fracture.      Electronically Signed:   Elenor Simpers, MD   2023/08/08 at 3:59 EDT   [place] Location ID and State: [address] / [place]   Tel [phone], Service support  [phone], Fax [phone]         XR Wrist 3+ Views Right   Final Result      No evidence of fracture.      Electronically Signed:   Elenor Simpers, MD   2023/08/08 at 3:58 EDT   [place] Location ID and State: [address] / [place]   Tel [phone], Service support  [phone], Fax [phone]         CT Angio Chest wo and/or w Contrast   Final Result   No evidence of pulmonary emboli.   CT abdomen and pelvis reported separately.      Electronically Signed:   Elenor Simpers, MD   2023/08/08 at 1:58 EDT   [place]  Location ID and State: [address] / [place]   Tel [phone], Service support  [phone], Fax [phone]         CT Abdomen Pelvis w Contrast   Final Result      1. Colonic diverticulosis without evidence of acute diverticulitis.   2. Bilateral nephrolithiasis without hydronephrosis.   3. Incidental right renal lesion 1.6 cm indeterminate. Follow-up imaging with   MRI recommended for further characterization.   CT chest reported separately.      Electronically Signed:   Elenor Simpers, MD   2023/08/08 at 2:03 EDT   Reading Location ID and State: [address] / [place]   Tel [phone], Service support  [phone], Fax [phone]         CT Head wo Contrast   Final Result      No acute abnormality.      CT angiogram and/or MRI may be helpful to evaluate for acute  infarct as   clinically indicated.      Electronically Signed:   Elenor Simpers, MD   2023/08/08 at 1:32 EDT   Reading Location ID and State: [address] / [place]   Tel [phone], Service support  [phone], Fax [phone]             MEDICAL DECISION MAKING:     Data Review and Analysis:  Source(s) of historical information: Spoke with patient to obtain additional information pertaining to the patient  Chronic Conditions impacting care: Asthma  Vital Sign Interpretation: Vital signs rhythm at rate 70.  Pulse oximetry 96% on room air.  Social influencer(s) of health: None  External facility review of records: None    Differential Diagnosis includes: acute coronary syndrome, pneumonia, gastroesophageal reflux, gastritis, musculoskeletal pain, pulmonary embolism, congestive heart failure exacerbation, anemia, electrolyte abnormality, asthma exacerbation, chronic obstructive pulmonary disease exacerbation  appendicitis, torsion, diverticulitis, urinary tract infection, renal calculus  migraine, tension, stress, musculoskeletal etiology, intracranial hemorrhage    Diagnostic studies considered but not performed (patient involved in discussion/decision):  None  Medications given during visit:   Medications   acetaminophen  (TYLENOL ) tablet 1,000 mg (1,000 mg oral Given 08/07/23 2254)   ketorolac  (TORADOL ) injection 15 mg (15 mg intravenous Given 08/07/23 2254)   sodium chloride  0.9 % flush 10 mL (10 mL intravenous Given 08/08/23 0019)   iopamidoL (ISOVUE-370) 370 mg iodine /mL (76 %) injection 100 mL (100 mL intravenous Given 08/08/23 0001)   metoclopramide  (REGLAN ) injection 10 mg (10 mg intravenous Given 08/08/23 0233)   sodium chloride  0.9 % bolus 1,000 mL (0 mL intravenous Stopped 08/08/23 0405)   ketorolac  (TORADOL ) injection 15 mg (15 mg intravenous Given 08/08/23 0232)   morphine  injection 4 mg (4 mg intravenous Given 08/08/23 0251)       ED Course as of 08/08/23 0538   Sat Aug 08, 2023   0042 CBC and differential(!)  CBC shows no leukocytosis or anemia [BF]   0042 Comprehensive metabolic panel(!)  CMP shows no electrolyte abnormality, renal, P[place]ock derangement [BF]   0042 High Sensitivity Troponin I: 5  No cardiac damage [BF]   0042 Total CK: 112  No rhabdomyolysis [BF]   0042 Urinalysis with reflex microscopic and culture(!)  Negative for urinary tract infection [BF]   0042 Magnesium : 2.2  Normal magnesium  [BF]   0042 Patient given Tylenol  1000 mg p.o. and Toradol  15 mg IV for pain control [BF]   0138 CT Head wo Contrast  Negative for intracranial hemorrhage or brain mass as interpreted by me and confirmed by radiology [BF]   0159 CT Angio Chest wo and/or w Contrast  No evidence of pulmonary emboli.  CT abdomen and pelvis reported separately.   [BF]   0207 CT Abdomen Pelvis w Contrast  1. Colonic diverticulosis without evidence of acute diverticulitis.  2. Bilateral nephrolithiasis without hydronephrosis.  3. Incidental right renal lesion 1.6 cm indeterminate. Follow-up imaging with  MRI recommended for further characterization.  CT chest reported separately.   [BF]   0232 This patient was identified to require interpreter services because she has limited [place]  proficiency.  The patient elected to use interpreter services via telephonic interpreter for interpretation.  Interpretation for this encounter was provided by: Interpreter [phone].   [BF]   0235 Patient given morphine  4 mg IV for pain control.  Patient still having headache.  Will give additional Toradol  15 mg IV, Reglan  10 mg IV and normal saline 1 L bolus [BF]   0249 Uric  Acid: 4.6  Uric acid is normal [BF]   0402 XR Hand 3+ Views Right  Negative for fracture as interpreted by me and confirmed by radiology [BF]   0403 XR Wrist 3+ Views Right  Negative for fracture as interpreted by me and confirmed by radiology [BF]   0413 ECG 12 lead  Normal sinus rhythm at rate 92.  Normal axis.  Normal intervals.  No ST elevations, ST depressions.  T wave version lead V6.  Similar to prior EKG on 07/28/2021.  EKG is interpreted by me. [BF]   0534 Explained to the patient the results of the EKG, CT, x-ray, blood work.  There is no anemia or electrolyte abnormality.  There is no cystitis or rhabdomyolysis.  No concern for gout.  Patient's headache has significantly improved although she is still having hand pain.  No concern for DVT or cellulitis on the hand.  Patient is not hypoxic at this time.  CT angio did not show any pulmonary embolism or aortic dissection.  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  CT head did not show any intracranial hemorrhage or brain mass.  Low concern for stroke given NIH stroke scale of 0 and no truncal ataxia, gait ataxia, vertical skew.  I will prescribe patient ibuprofen, Tylenol , oxycodone  for pain.  Patient is currently stable for discharge [BF]      ED Course User Index  [BF] Penne Holmes, MD         Clinical Impressions as of 08/08/23 0538   Chest pain, unspecified type   Pain of right hand   Acute nonintractable headache, unspecified headache type       Disposition:   Disposition: Discharge  Discussed findings  with patient/family.   Medications prescribed at discharge:   New Prescriptions    ACETAMINOPHEN  (TYLENOL ) 500 MG TABLET    Take 2 tablets (1,000 mg total) by mouth every 6 (six) hours if needed for mild pain for up to 10 days.    IBUPROFEN (ADVIL,MOTRIN) 600 MG TABLET    Take 1 tablet (600 mg total) by mouth every 6 (six) hours if needed for mild pain for up to 7 days.    OXYCODONE  (ROXICODONE ) 5 MG IMMEDIATE RELEASE TABLET    Take 1 tablet (5 mg total) by mouth every 6 (six) hours if needed for severe pain for up to 3 days. Max Daily Amount: 20 mg     Patient is stable for discharge.  Recommended follow up with primary care physician in 7 days. Given written return precautions and additional verbal precautions. Patient/Family expressed understanding of and agreement with discharge instructions.      Discussion with patient/family: Patient/Family was agreeable to plan of care    Procedures    I, Penne Holmes, MD, am the primary clinician of this chart.    Penne Holmes, MD    Department of Emergency Medicine               Penne Holmes, MD  08/08/23 0236       Penne Holmes, MD  08/08/23 [phone]

## 2023-08-25 ENCOUNTER — Telehealth (INDEPENDENT_AMBULATORY_CARE_PROVIDER_SITE_OTHER): Payer: Self-pay

## 2023-08-25 NOTE — Telephone Encounter (Signed)
 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 We wanted her to see a GI provider for workup of her persistent nausea/vomiting and need for a gastric emptying study. I see that she no-showed her visit with Nichole Sisserson on 6/12 but would you be able to try and get her back on the schedule?    Provider requesting follow up:   ChiMaude, RN    Chart check:   Insurance coverage:     [facility] [employer]     Previous office visit with [facility] providers: no    Navigator Action:    Request to schedule sent to [facility] front office team.

## 2023-08-26 ENCOUNTER — Telehealth: Payer: Self-pay

## 2023-08-26 NOTE — Progress Notes (Signed)
 [facility]    Provider: Nena CHRISTELLA Robins, PA  Date of Exam:  08/27/2023   Patient:  Valerie May  DOB:  Nov 28, 1970    AGE:  53 y.o. 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 She reports posterior right hip pain that extends distally to her right lower extremity x 3 years. Reports getting worse without inciting event. Has been taking tramadol  without relief. Went to the ER recently and was given rx Ibuprofen, tylenol , and oxycodone . Reports slight relief of symptoms but still pain with WB, walking, hip ROM. Was previously referred to pain management clinic, MRI lumbar spine, and PT for low back by Schuyler Oman NP but Valerie May was not aware of this. Valerie May is now here for further evaluation and discussion of treatment options.    For other past medical history, social history, family history and past surgical history please see them listed below.      Problem List: Problem List[1]    Past Medical History:  Medical History[2]    Social History: Social History[3]    Family History: Family History[4]    Past Surgical History:  Past Surgical History[5]    Medications:  Scheduled Meds:Current Facility-Administered Medications[6]  Continuous Infusions:  PRN Meds:.  Current Medications[7]     Allergies: Allergies[8]    Patient Care Team:  Zik, [place] J, MD as PCP - General (Internal Medicine)  Wilfrid Oneil PARAS, MD as Consulting Physician (Critical Care Medicine)  Learn, Maude, MD as Consulting Physician (Surgery)        OBJECTIVE:    Vitals: BP 136/89 (BP Site: Left arm, Patient Position: Sitting, Cuff Size: Medium)   Pulse (!) 102   LMP 03/18/2012   Exam:   Constitutional: No acute distress. Well nourished. Well developed. Her body mass index is unknown because there is no height or weight on file.  Head:  Normocephalic, atraumatic.  Respiratory: No labored breathing.  Skin: No rashes visualized. Pt is not diaphoretic.  Psychiatric: Awake, alert, oriented, easily engaged, displayed logical thinking with clear speech.      Musculoskeletal:  Right Hip:  The skin is intact with no abrasions or open wounds.  There is no soft tissue swelling, erythema, or bruising.  No visible deformity or asymmetry.  Non-antalgic gait  TTP over ASIS  Mild TTP over AIIS  TTP over anterior groin  TTP over trochanteric bursa  TTP over IT band  Hip flexion WNL  Internal Rotation WNL but elicits pain  External Rotation WNL  Hip flexor strength 5/5 but elicits pain  Abductor strength 5/5  Adductor strength 5/5  5/5 Ankle dorsiflexion and plantar flexion strength  Special Tests:  Negative FABER  Pain over lateral hip with FADIR  Pain with Anterior Impingement Test  Negative Log Roll  Negative Heel Strike  Negative straight leg raise  Distal sensation grossly intact to light touch  Compartments soft     IMAGING/STUDIES:    Additional Diagnostic Imaging:  Outside studies:     XR Hip right 2-3 vw with pelvis [[id number]] (Order [id number])  Status: Final result     Study Result    Narrative & Impression   HISTORY: Right hip pain     COMPARISON: None.  FINDINGS:  There is no fracture of the right hip or pelvis. The hip joint spaces are  preserved. There are no erosive changes. There is lumbar spondylosis,  incompletely evaluated.     IMPRESSION:      1. No fracture of the right hip or pelvis. However, assessment for  nondisplaced fracture is limited. If there is persistent pain and  difficulty weightbearing, then further assessment with MRI or CT would be  recommended to assess for occult fracture.      2. Lumbar spondylosis.     Derick Clap, MD  04/13/2023 9:35 AM         ASSESSMENT/PLAN:      Formulated diagnosis:    Diagnosis ICD-10-CM Associated Order   1. Oth specific joint derangements of right hip, NEC  M24.851 Ambulatory referral to  Physical Therapy     MRI Hip Right WO Contrast      2. Chronic right hip pain  M25.551 Ambulatory referral to Physical Therapy    G89.29 MRI Hip Right WO Contrast      3. Trochanteric bursitis of right hip  M70.61       4. Muscle strain  T14.8XXA             Valerie May is a pleasant 53 y.o. female with historical and clinical evidence of chronic right hip pain. Ddx includes muscle strain, trochanteric bursitis, IT band syndrome, occult fx, labral tear, impingement. The patient's diagnosis and treatment options were discussed in detail at today's visit. Provided PT referral. Provided MRI referral right hip to r/o occult fx vs impingement vs labral tear vs other etiologies of patient's pain. She previously received referral to pain management clinic, PT for lumbar spine, and MRI for lumbar spine; this was reprinted for her. She has appt with Dr. Allene in September. Erxed lidocaine  patches to aid with symptom relief. She has rx of ibuprofen, tylenol , oxycodone . Recommended rest, ice elevation. Modify activity to avoid adding stress to the affected knee. Advised patient to follow up with [facility] specialist shortly after obtaining MRI hip for further evaluation and management of symptoms. Patient expressed understanding and agrees with plan.     The patient will notify my clinic of any changes or worsening of their symptoms during the interim.      Failure to follow-up could result in a poor outcome or failure of treatment altogether. If the patient is unable to follow-up, they are to go to the Emergency Department if their symptoms persists or worsen. I have reviewed the treatment plan and follow-up instructions with the patient and or patient representatives, addressed any concerns and questions. The patient and/or responsible party has verbalized understanding and agrees with the plan.        ---------------------------------------------------------------------------------------------------------------------------------------------------------------------------------------------------------------------     The review of the patient's medications does not in any way constitute an endorsement, by this clinician, of their use, dosage, indications, route, efficacy, interactions, or other clinical parameters.    Please pardon any potential grammatical errors or typos as aspects of this note may have been created through speech-to-text software.          [1]   Patient Active Problem List  Diagnosis    GERD (gastroesophageal reflux disease)    Lumbar spondylosis    OSA (obstructive sleep apnea)    Vitamin D  deficiency    History of CVA (cerebrovascular accident)    Asthma    Bronchiectasis without complication (CMS/HCC)    Personal history of pneumonia (recurrent)  Chronic cough    Neuroendocrine tumor of pancreas (CMS/HCC)    Depression with anxiety    Migraines    Eosinophilia    Prediabetes   [2]   Past Medical History:  Diagnosis Date    COVID-19 2021    Kidney stone     OSA (obstructive sleep apnea)     Pneumonia     Uterine leiomyoma 10/11/2017   [3]   Social History  Tobacco Use    Smoking status: Never     Passive exposure: Current    Smokeless tobacco: Never   Vaping Use    Vaping status: Never Used   Substance Use Topics    Alcohol  use: Not Currently    Drug use: Never   [4]   Family History  Problem Relation Name Age of Onset    Heart attack Mother      Diabetes Father      Heart attack Maternal Grandfather      Heart attack Paternal Grandfather      Stroke Son      Heart attack Maternal Uncle      Stomach cancer Cousin      Bone cancer Maternal Aunt      Colon cancer Neg Hx      Breast cancer Neg Hx     [5]   Past Surgical History:  Procedure Laterality Date    CYST REMOVAL      ESOPHAGOGASTRODUODENOSCOPY (EGD), ESOPHAGEAL ULTRASOUND (EUS) LIMITED TO THE ESOPHAGUS, STOMACH OR DUODENUM N/A  04/10/2023    Procedure: ESOPHAGOGASTRODUODENOSCOPY (EGD), ESOPHAGEAL ULTRASOUND (EUS) LIMITED TO THE ESOPHAGUS, STOMACH OR DUODENUM;  Surgeon: Abagail Carole HERO, MD;  Location: [facility];  Service: Gastroenterology;  Laterality: N/A;    ESOPHAGOGASTRODUODENOSCOPY (EGD), ESOPHAGEAL ULTRASOUND (EUS) WITH FINE NEEDLE ASPIRATION/BIOPSY OF ESOPHAGUS, STOMACH, OR DUODENUM N/A 03/25/2023    Procedure: ESOPHAGOGASTRODUODENOSCOPY (EGD), ESOPHAGEAL ULTRASOUND (EUS) WITH FINE NEEDLE ASPIRATION/BIOPSY OF ESOPHAGUS, STOMACH, OR DUODENUM;  Surgeon: Abagail Carole HERO, MD;  Location: [facility];  Service: Gastroenterology;  Laterality: N/A;    ESOPHAGOGASTRODUODENOSCOPY (EGD), ESOPHAGEAL ULTRASOUND (EUS) WITH FINE NEEDLE ASPIRATION/BIOPSY OF ESOPHAGUS, STOMACH, OR DUODENUM N/A 04/03/2023    Procedure: ESOPHAGOGASTRODUODENOSCOPY (EGD), ESOPHAGEAL ULTRASOUND (EUS) WITH FINE NEEDLE ASPIRATION/BIOPSY OF ESOPHAGUS, STOMACH, OR DUODENUM;  Surgeon: Ulysess Foots, MD;  Location: [facility];  Service: Gastroenterology;  Laterality: N/A;    GALLBLADDER SURGERY      HERNIA REPAIR      HYSTERECTOMY      TUBAL LIGATION     [6] [7]   Current Outpatient Medications:     acetaminophen  (TYLENOL ) 650 MG CR tablet, TAKE 1 TABLET BY MOUTH EVERY 8 HOURS AS NEEDED FOR PAIN, Disp: 90 tablet, Rfl: 1    escitalopram  (LEXAPRO ) 10 MG tablet, Take 1 tablet (10 mg) by mouth daily, Disp: 30 tablet, Rfl: 5    meloxicam (MOBIC) 15 MG tablet, Take 1 tablet (15 mg) by mouth once daily, Disp: , Rfl:     metoclopramide  (REGLAN ) 5 MG tablet, Take 1 tablet (5 mg) by mouth 3 (three) times daily with meals, Disp: , Rfl:     albuterol  sulfate HFA (PROVENTIL ) 108 (90 Base) MCG/ACT inhaler, Inhale 1-2 puffs into the lungs every 4 (four) hours as needed, Disp: , Rfl:     benzonatate  (TESSALON ) 200 MG capsule, Take 1 capsule (200 mg) by mouth 3 (three) times daily as needed for Cough, Disp: , Rfl:     Budeson-Glycopyrrol-Formoterol  160-9-4.8 MCG/ACT Aerosol, Inhale 2  puffs into the lungs 2 (  two) times daily, Disp: 10.7 g, Rfl: 5    budesonide -formoterol  (SYMBICORT ) 160-4.5 MCG/ACT inhaler, Inhale 2 puffs into the lungs 2 (two) times daily, Disp: 1 each, Rfl: 0    cetirizine  (ZyrTEC ) 10 MG tablet, Take 1 tablet (10 mg) by mouth daily, Disp: , Rfl:     diphenhydrAMINE -APAP, sleep, (Excedrin PM) 38-500 MG Tab, Take by mouth daily, Disp: , Rfl:     DULoxetine  (CYMBALTA ) 30 MG capsule, Take 1 capsule (30 mg) by mouth daily for 14 days, THEN 2 capsules (60 mg) daily., Disp: 194 capsule, Rfl: 0    ergocalciferol  (ERGOCALCIFEROL ) 1.25 MG (50000 UT) capsule, Take 1 capsule (50,000 Units) by mouth once a week, Disp: 12 capsule, Rfl: 1    gabapentin  (NEURONTIN ) 300 MG capsule, Take 1 capsule (300 mg) by mouth once nightly, Disp: 30 capsule, Rfl: 1    lidocaine  (LIDODERM ) 5 %, Place 1 patch onto the skin in the morning. Remove & Discard patch within 12 hours or as directed by MD., Disp: 30 patch, Rfl: 0    montelukast  (SINGULAIR ) 10 MG tablet, Take 1 tablet (10 mg) by mouth nightly, Disp: 90 tablet, Rfl: 3    pantoprazole  (PROTONIX ) 40 MG tablet, Take 1 tablet (40 mg) by mouth 2 (two) times daily, Disp: 30 tablet, Rfl: 1    polyethylene glycol (MIRALAX ) 17 GM/SCOOP powder, Take 17 g by mouth daily, Disp: 90 each, Rfl: 3    senna-docusate (PERICOLACE) 8.6-50 MG per tablet, Take 2 tablets by mouth nightly, Disp: 60 tablet, Rfl: 0    SUMAtriptan  (IMITREX ) 25 MG tablet, Take 1 tablet (25 mg) by mouth every 2 (two) hours as needed for Migraine, Disp: 9 tablet, Rfl: 3    umeclidinium bromide  (INCRUSE ELLIPTA ) 62.5 MCG/ACT Aerosol Pwdr, Breath Activated, Inhale 1 puff into the lungs daily (Patient not taking: Reported on 08/27/2023), Disp: 1 each, Rfl: 0  [8]   Allergies  Allergen Reactions    Benadryl  [Diphenhydramine ]     Dust Mite Extract     Methocarbamol Itching    Molds & Smuts     Other     Compazine [Prochlorperazine] Anxiety

## 2023-08-27 ENCOUNTER — Ambulatory Visit (INDEPENDENT_AMBULATORY_CARE_PROVIDER_SITE_OTHER): Admitting: Physician Assistant

## 2023-08-27 VITALS — BP 136/89 | HR 102

## 2023-08-27 DIAGNOSIS — G8929 Other chronic pain: Secondary | ICD-10-CM

## 2023-08-27 DIAGNOSIS — T148XXA Other injury of unspecified body region, initial encounter: Secondary | ICD-10-CM

## 2023-08-27 DIAGNOSIS — M7061 Trochanteric bursitis, right hip: Secondary | ICD-10-CM

## 2023-08-27 DIAGNOSIS — M24851 Other specific joint derangements of right hip, not elsewhere classified: Secondary | ICD-10-CM

## 2023-08-27 DIAGNOSIS — M25551 Pain in right hip: Secondary | ICD-10-CM

## 2023-08-27 MED ORDER — LIDOCAINE 5 % EX PTCH: 1.0000 | MEDICATED_PATCH | CUTANEOUS | 0 refills | Status: DC

## 2023-08-28 ENCOUNTER — Ambulatory Visit (INDEPENDENT_AMBULATORY_CARE_PROVIDER_SITE_OTHER): Admitting: Internal Medicine

## 2023-09-03 ENCOUNTER — Other Ambulatory Visit (INDEPENDENT_AMBULATORY_CARE_PROVIDER_SITE_OTHER): Payer: Self-pay | Admitting: Physician Assistant

## 2023-09-03 DIAGNOSIS — G8929 Other chronic pain: Secondary | ICD-10-CM

## 2023-09-03 DIAGNOSIS — M24851 Other specific joint derangements of right hip, not elsewhere classified: Secondary | ICD-10-CM

## 2023-09-09 ENCOUNTER — Ambulatory Visit (INDEPENDENT_AMBULATORY_CARE_PROVIDER_SITE_OTHER): Admitting: Family Medicine

## 2023-09-09 NOTE — Telephone Encounter (Signed)
 First attempt- called with a spanish interpreter to schedule patient for nausea/vomiting . Unable to leave a voicemail.

## 2023-09-11 ENCOUNTER — Inpatient Hospital Stay (INDEPENDENT_AMBULATORY_CARE_PROVIDER_SITE_OTHER): Admitting: Physician Assistant

## 2023-09-11 NOTE — Telephone Encounter (Signed)
 Second attempt- Called again with a spanish interpreter. Unable to lvm

## 2023-09-11 NOTE — Progress Notes (Deleted)
 [name], [place]  [facility]      Date Time: September 11, 2023 10:40 AM  Patient Name: Valerie May, Valerie May  Requesting Physician: Zik, [place] J, MD     Verbal consent obtained to record this visit    Reason for Consultation/Chief Complaint:     No chief complaint on file.        Assessment and Plan:   Valerie May is a 53 y.o. female w/ PMHx *** who presents to [facility] for ***    Assessment & Plan      No orders of the defined types were placed in this encounter.      **If over 140 or 90  Blood Pressure Screening - Preventive Care and Screening for High BP and Follow-Up  {QM MIPS HTN - Specialists:55620::informed that their blood pressure was elevated:1}     FOLLOW UP:  No follow-ups on file.     History:     History of Present Illness      04/08/23 oncology team reached out for EUS/biopsy for new pancreatic mass    312/25 patient to ED at [facility], admitted to hospital.   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 Elinor consulted during hospital visit  04/14/23 inpatient team requested post hospital appointment for gastroparesis and gastric emptying study in 6 weeks to navigators.  04/14/23 inpatient palliative care consult, no d/c date listed yet     Prior Abdominal Surgeries -  Family Hx GI related cancers -     The patient denies fevers, weight loss, changes in appetite or bowel habits, abdominal pain, early satiety, dysphagia, odynophagia, nausea, vomiting,  reflux, heartburn, diarrhea or constipation, hematemesis, melena, or hematochezia.     Anticoagulants - none***    Results      LABS:  Lab Results   Component Value Date    WBC 8.35 04/13/2023    HGB 13.7 04/13/2023    HCT 40.4 04/13/2023    MCV 88.0 04/13/2023    PLT 357 (H) 04/13/2023    CREAT 0.6 04/13/2023    EGFR >60.0 04/13/2023    AST 37 04/12/2023    ALT 57 (H) 04/12/2023    ALKPHOS 108 04/12/2023    ALB 3.7 04/12/2023    BILITOTAL 0.4  04/12/2023    BILIDIRECT 0.2 04/08/2023    BILIINDIRECT 0.3 04/08/2023    LIP 28 04/11/2023    TSH 0.891 05/08/2023    T4FREE 0.82 03/22/2023    TTGIGA <1.0 03/30/2023    ESR 35 (H) 04/09/2023    CRP 0.3 04/08/2023     Lab Results   Component Value Date    B12 554 03/23/2023    STLOB Negative 04/09/2023       ENDOSCOPIES:  EGD EUS 04/10/23:  EGD - Normal stomach. - Normal examined duodenum. - No specimens collected.  EUS - A mass was identified in the pancreatic head. The staging applies if malignancy is confirmed. Fine needle aspiration performed. - There was no sign of significant pathology in the common bile duct. - Evidence of a cholecystectomy.    GI PATHOLOGY:  FNA 04/10/23:  A. Pancreas, head, mass, endoscopic ultrasound-guided fine-needle aspiration:             - Well differentiated neuroendocrine tumor, see comment.     03/25/23:  A. Esophagus, brushing:             - Candida esophagitis.    RADIOLOGY:  CT Abdomen Pelvis W IV/ WO PO Cont 08/08/2023  LOWER CHEST:  Lung bases are clear.  LIVER:  Unremarkable.  GALLBLADDER/BILE DUCTS:  Gallbladder is surgically absent.  PANCREAS:  Unremarkable.  SPLEEN:  Unremarkable.  ADRENAL GLANDS:  Unremarkable.  KIDNEYS / URETERS:  On 0.6 cm low-attenuation lesion at the right kidney  measures greater than water  attenuation and not fully characterized. Small  calculi in both kidneys. No hydronephrosis.  BOWEL / MESENTERY:  Diverticula throughout the colon..   No bowel obstruction.  APPENDIX:  Identified and normal.  No evidence of acute appendicitis.  PERITONEUM: No free air.    No free fluid.  VESSELS:  Abdominal aorta is normal caliber.  RETROPERITONEUM:  Unremarkable.  REPRODUCTIVE ORGANS:  Uterus not identified.  BLADDER: Unremarkable.  ABDOMINAL WALL:  Unremarkable.  BONES:  No acute abnormality.  OTHER: None.  ___________________________________    Impression  1. Colonic diverticulosis without evidence of acute diverticulitis.  2. Bilateral nephrolithiasis without  hydronephrosis.  3. Incidental right renal lesion 1.6 cm indeterminate. Follow-up imaging with  MRI recommended for further characterization.  CT chest reported separately.    CT A/P 04/09/23:  1.Minimal fat stranding/fluid adjacent to the pancreatic head may represent  postbiopsy changes. Recommend correlation with lipase to exclude acute  pancreatitis.  2.Slightly increased size of the pancreatic head lesion, potentially  reflecting intralesional hemorrhage post biopsy.    MRI ABD 03/23/23:  1.Pancreatic head 1.2 cm enhancing lesion, suspected neoplasm. This lesion  appears confined of the pancreas and demonstrates no major vascular  interfaces. Recommend EUS for further evaluation.  2.No findings suspicious for metastatic disease within the imaged abdomen.  3.Mid right renal 1.7 cm cyst, recently demonstrating intermediate density  by CT and is benign.  4.Hepatic steatosis.  5.Colonic diverticulosis.    CT A/P 03/21/23:  1.No acute abnormality within the chest, abdomen, or pelvis.  2.No findings of acute pulmonary embolism within the limitations stated  above.  3.Hyperdense pancreatic head lesion. Further evaluation with MRI/MRCP is  recommended.  4.Mildly hyperdense right renal lesion, possible hemorrhagic/proteinaceous  cyst. This can also be further evaluated on MRI.  5.Additional chronic findings as above.    Past Medical History:     Past Medical History:   Diagnosis Date    COVID-19 2021    Kidney stone     OSA (obstructive sleep apnea)     Pneumonia     Uterine leiomyoma 10/11/2017       Past Surgical History:   Past Surgical History[1]    Family History:   Family History[2]    Social History:   Social History[3]     Allergies:   Allergies[4]    Medications:   Current Medications[5]    Review of Systems:     Negative except as stated in HPI    Physical Exam:   There were no vitals filed for this visit.     There is no height or weight on file to calculate BMI.    General appearance - Well developed and well  nourished. Normal gait.  HEENT- Normocephalic. Sclera anicteric. Normal hearing. Nares normal.  Oropharynx normal.  Neck - Supple.  No thyromegaly.  No cervical lymphadenopathy.  Chest - clear to percussion and auscultation.  No wheeze, rhonchi, or rales.  Heart - regular rate and rhythm without murmurs, gallops, or rubs.  Abdomen - normal bowel sounds.  No focal tenderness to palpation. Abdomen is soft. No rebound or guarding. No hepatosplenomegaly.  No masses. No  ascites.  Rectal - deferred until time of colonoscopy.  Musculoskeletal - normal range of motion of arms and legs.  Extremities - no clubbing, cyanosis, or edema.  Skin - no rashes or lesions.  Neurologic - Alert and oriented to person, place and time.  No focal motor or sensory deficits.  No asterixis.  Recent and remote memory normal.       All of the above was reviewed and discussed with the patient.  Face-to-face time was spent in patient education counseling and coordination of care. Additionally, differential diagnoses, suggested diagnostic work-up and potential treatment options were discussed. Questions and concerns were addressed. Will proceed as noted and follow clinically.The patient verbalized understanding of the assessment and plan with verbalized understanding and all questions answered. Patient education provided. Potential medication side effects discussed with patient including risk and benefits. Further recommendations based on clinical progression and test results. Contact information provided and instructed to inform us  with changes.     I spent *** minutes of total time in patient care.  This includes the time spent reviewing pathology and imaging reports prior to the encounter as well as time spent discussing the care with other physicians and also face to face time with the patient. This time excludes any separately reportable procedures.    Signed by:  [name], PA-C  [facility]       Dr. Rexford ref. provider found,  thank you for referring Valerie May to [facility]. It was a pleasure to see her and to participate in her care. Please do not hesitate to reach out to me if you have any clinical concerns to discuss.       CC:             [1]   Past Surgical History:  Procedure Laterality Date    CYST REMOVAL      ESOPHAGOGASTRODUODENOSCOPY (EGD), ESOPHAGEAL ULTRASOUND (EUS) LIMITED TO THE ESOPHAGUS, STOMACH OR DUODENUM N/A 04/10/2023    Procedure: ESOPHAGOGASTRODUODENOSCOPY (EGD), ESOPHAGEAL ULTRASOUND (EUS) LIMITED TO THE ESOPHAGUS, STOMACH OR DUODENUM;  Surgeon: Abagail Carole HERO, MD;  Location: [facility];  Service: Gastroenterology;  Laterality: N/A;    ESOPHAGOGASTRODUODENOSCOPY (EGD), ESOPHAGEAL ULTRASOUND (EUS) WITH FINE NEEDLE ASPIRATION/BIOPSY OF ESOPHAGUS, STOMACH, OR DUODENUM N/A 03/25/2023    Procedure: ESOPHAGOGASTRODUODENOSCOPY (EGD), ESOPHAGEAL ULTRASOUND (EUS) WITH FINE NEEDLE ASPIRATION/BIOPSY OF ESOPHAGUS, STOMACH, OR DUODENUM;  Surgeon: Abagail Carole HERO, MD;  Location: [facility];  Service: Gastroenterology;  Laterality: N/A;    ESOPHAGOGASTRODUODENOSCOPY (EGD), ESOPHAGEAL ULTRASOUND (EUS) WITH FINE NEEDLE ASPIRATION/BIOPSY OF ESOPHAGUS, STOMACH, OR DUODENUM N/A 04/03/2023    Procedure: ESOPHAGOGASTRODUODENOSCOPY (EGD), ESOPHAGEAL ULTRASOUND (EUS) WITH FINE NEEDLE ASPIRATION/BIOPSY OF ESOPHAGUS, STOMACH, OR DUODENUM;  Surgeon: Ulysess Foots, MD;  Location: [facility];  Service: Gastroenterology;  Laterality: N/A;    GALLBLADDER SURGERY      HERNIA REPAIR      HYSTERECTOMY      TUBAL LIGATION     [2]   Family History  Problem Relation Name Age of Onset    Heart attack Mother      Diabetes Father      Heart attack Maternal Grandfather      Heart attack Paternal Grandfather      Stroke Son      Heart attack Maternal Uncle      Stomach cancer Cousin      Bone cancer Maternal Aunt      Colon cancer Neg Hx      Breast cancer Neg Hx     [  3]   Social History  Tobacco Use    Smoking status: Never      Passive exposure: Current    Smokeless tobacco: Never   Vaping Use    Vaping status: Never Used   Substance Use Topics    Alcohol  use: Not Currently    Drug use: Never   [4]   Allergies  Allergen Reactions    Benadryl  [Diphenhydramine ]     Dust Mite Extract     Methocarbamol Itching    Molds & Smuts     Other     Compazine [Prochlorperazine] Anxiety   [5]   Current Outpatient Medications:     acetaminophen  (TYLENOL ) 650 MG CR tablet, TAKE 1 TABLET BY MOUTH EVERY 8 HOURS AS NEEDED FOR PAIN, Disp: 90 tablet, Rfl: 1    albuterol  sulfate HFA (PROVENTIL ) 108 (90 Base) MCG/ACT inhaler, Inhale 1-2 puffs into the lungs every 4 (four) hours as needed, Disp: , Rfl:     benzonatate  (TESSALON ) 200 MG capsule, Take 1 capsule (200 mg) by mouth 3 (three) times daily as needed for Cough, Disp: , Rfl:     Budeson-Glycopyrrol-Formoterol  160-9-4.8 MCG/ACT Aerosol, Inhale 2 puffs into the lungs 2 (two) times daily, Disp: 10.7 g, Rfl: 5    budesonide -formoterol  (SYMBICORT ) 160-4.5 MCG/ACT inhaler, Inhale 2 puffs into the lungs 2 (two) times daily, Disp: 1 each, Rfl: 0    cetirizine  (ZyrTEC ) 10 MG tablet, Take 1 tablet (10 mg) by mouth daily, Disp: , Rfl:     diphenhydrAMINE -APAP, sleep, (Excedrin PM) 38-500 MG Tab, Take by mouth daily, Disp: , Rfl:     DULoxetine  (CYMBALTA ) 30 MG capsule, Take 1 capsule (30 mg) by mouth daily for 14 days, THEN 2 capsules (60 mg) daily., Disp: 194 capsule, Rfl: 0    ergocalciferol  (ERGOCALCIFEROL ) 1.25 MG (50000 UT) capsule, Take 1 capsule (50,000 Units) by mouth once a week, Disp: 12 capsule, Rfl: 1    escitalopram  (LEXAPRO ) 10 MG tablet, Take 1 tablet (10 mg) by mouth daily, Disp: 30 tablet, Rfl: 5    gabapentin  (NEURONTIN ) 300 MG capsule, Take 1 capsule (300 mg) by mouth once nightly, Disp: 30 capsule, Rfl: 1    lidocaine  (LIDODERM ) 5 %, Place 1 patch onto the skin in the morning. Remove & Discard patch within 12 hours or as directed by MD., Disp: 30 patch, Rfl: 0    meloxicam (MOBIC) 15 MG tablet,  Take 1 tablet (15 mg) by mouth once daily, Disp: , Rfl:     metoclopramide  (REGLAN ) 5 MG tablet, Take 1 tablet (5 mg) by mouth 3 (three) times daily with meals, Disp: , Rfl:     montelukast  (SINGULAIR ) 10 MG tablet, Take 1 tablet (10 mg) by mouth nightly, Disp: 90 tablet, Rfl: 3    pantoprazole  (PROTONIX ) 40 MG tablet, Take 1 tablet (40 mg) by mouth 2 (two) times daily, Disp: 30 tablet, Rfl: 1    polyethylene glycol (MIRALAX ) 17 GM/SCOOP powder, Take 17 g by mouth daily, Disp: 90 each, Rfl: 3    senna-docusate (PERICOLACE) 8.6-50 MG per tablet, Take 2 tablets by mouth nightly, Disp: 60 tablet, Rfl: 0    SUMAtriptan  (IMITREX ) 25 MG tablet, Take 1 tablet (25 mg) by mouth every 2 (two) hours as needed for Migraine, Disp: 9 tablet, Rfl: 3    umeclidinium bromide  (INCRUSE ELLIPTA ) 62.5 MCG/ACT Aerosol Pwdr, Breath Activated, Inhale 1 puff into the lungs daily (Patient not taking: Reported on 08/27/2023), Disp: 1 each, Rfl: 0

## 2023-09-14 ENCOUNTER — Other Ambulatory Visit (INDEPENDENT_AMBULATORY_CARE_PROVIDER_SITE_OTHER): Payer: Self-pay

## 2023-09-14 ENCOUNTER — Encounter (INDEPENDENT_AMBULATORY_CARE_PROVIDER_SITE_OTHER): Admitting: Internal Medicine

## 2023-09-14 DIAGNOSIS — F418 Other specified anxiety disorders: Secondary | ICD-10-CM

## 2023-09-14 DIAGNOSIS — R112 Nausea with vomiting, unspecified: Secondary | ICD-10-CM

## 2023-09-14 MED ORDER — ONDANSETRON 4 MG PO TBDP
4.0000 mg | ORAL_TABLET | Freq: Three times a day (TID) | ORAL | 1 refills | Status: DC | PRN
Start: 2023-09-14 — End: 2023-09-24

## 2023-09-14 MED ORDER — ESCITALOPRAM OXALATE 10 MG PO TABS
10.0000 mg | ORAL_TABLET | Freq: Every day | ORAL | 3 refills | Status: DC
Start: 2023-09-14 — End: 2023-09-25

## 2023-09-14 MED ORDER — METOCLOPRAMIDE HCL 5 MG PO TABS
5.0000 mg | ORAL_TABLET | Freq: Three times a day (TID) | ORAL | 1 refills | Status: AC
Start: 2023-09-14 — End: 2023-10-04

## 2023-09-24 ENCOUNTER — Encounter (HOSPITAL_BASED_OUTPATIENT_CLINIC_OR_DEPARTMENT_OTHER): Payer: Self-pay

## 2023-09-24 ENCOUNTER — Encounter (INDEPENDENT_AMBULATORY_CARE_PROVIDER_SITE_OTHER): Payer: Self-pay | Admitting: Physician Assistant

## 2023-09-24 ENCOUNTER — Telehealth (INDEPENDENT_AMBULATORY_CARE_PROVIDER_SITE_OTHER): Payer: Self-pay

## 2023-09-24 ENCOUNTER — Ambulatory Visit (INDEPENDENT_AMBULATORY_CARE_PROVIDER_SITE_OTHER): Admitting: Orthopaedic Surgery of the Spine

## 2023-09-24 ENCOUNTER — Ambulatory Visit (INDEPENDENT_AMBULATORY_CARE_PROVIDER_SITE_OTHER): Admitting: Physician Assistant

## 2023-09-24 ENCOUNTER — Encounter (INDEPENDENT_AMBULATORY_CARE_PROVIDER_SITE_OTHER): Payer: Self-pay

## 2023-09-24 ENCOUNTER — Encounter (INDEPENDENT_AMBULATORY_CARE_PROVIDER_SITE_OTHER): Payer: Self-pay | Admitting: Orthopaedic Surgery of the Spine

## 2023-09-24 VITALS — BP 118/84 | HR 93 | Ht 59.0 in | Wt 167.7 lb

## 2023-09-24 DIAGNOSIS — D3A8 Other benign neuroendocrine tumors: Secondary | ICD-10-CM

## 2023-09-24 DIAGNOSIS — Z1211 Encounter for screening for malignant neoplasm of colon: Secondary | ICD-10-CM

## 2023-09-24 DIAGNOSIS — Z6833 Body mass index (BMI) 33.0-33.9, adult: Secondary | ICD-10-CM

## 2023-09-24 DIAGNOSIS — M5127 Other intervertebral disc displacement, lumbosacral region: Secondary | ICD-10-CM

## 2023-09-24 DIAGNOSIS — M4726 Other spondylosis with radiculopathy, lumbar region: Secondary | ICD-10-CM

## 2023-09-24 DIAGNOSIS — B3781 Candidal esophagitis: Secondary | ICD-10-CM

## 2023-09-24 DIAGNOSIS — Z800 Family history of malignant neoplasm of digestive organs: Secondary | ICD-10-CM

## 2023-09-24 DIAGNOSIS — R112 Nausea with vomiting, unspecified: Secondary | ICD-10-CM

## 2023-09-24 DIAGNOSIS — K581 Irritable bowel syndrome with constipation: Secondary | ICD-10-CM

## 2023-09-24 DIAGNOSIS — M4727 Other spondylosis with radiculopathy, lumbosacral region: Secondary | ICD-10-CM

## 2023-09-24 DIAGNOSIS — R6881 Early satiety: Secondary | ICD-10-CM

## 2023-09-24 DIAGNOSIS — M5126 Other intervertebral disc displacement, lumbar region: Secondary | ICD-10-CM

## 2023-09-24 DIAGNOSIS — K219 Gastro-esophageal reflux disease without esophagitis: Secondary | ICD-10-CM

## 2023-09-24 DIAGNOSIS — M48062 Spinal stenosis, lumbar region with neurogenic claudication: Secondary | ICD-10-CM

## 2023-09-24 MED ORDER — NA SULFATE-K SULFATE-MG SULF 17.5-3.13-1.6 GM/177ML PO SOLN
1.0000 | Freq: Once | ORAL | 0 refills | Status: AC
Start: 2023-09-24 — End: 2023-09-24

## 2023-09-24 MED ORDER — PANTOPRAZOLE SODIUM 40 MG PO TBEC: 40.0000 mg | DELAYED_RELEASE_TABLET | Freq: Every day | ORAL | 2 refills | Status: DC

## 2023-09-24 MED ORDER — CELECOXIB 100 MG PO CAPS
200.0000 mg | ORAL_CAPSULE | Freq: Every day | ORAL | 3 refills | Status: AC
Start: 2023-09-24 — End: ?

## 2023-09-24 MED ORDER — LINACLOTIDE 145 MCG PO CAPS: 145.0000 ug | ORAL_CAPSULE | Freq: Every day | ORAL | 2 refills | Status: DC

## 2023-09-24 MED ORDER — LIDOCAINE 5 % EX PTCH
1.0000 | MEDICATED_PATCH | CUTANEOUS | 2 refills | Status: AC
Start: 2023-09-24 — End: ?

## 2023-09-24 MED ORDER — ONDANSETRON 4 MG PO TBDP
4.0000 mg | ORAL_TABLET | Freq: Three times a day (TID) | ORAL | 1 refills | Status: DC | PRN
Start: 2023-09-24 — End: 2023-09-25

## 2023-09-24 NOTE — Telephone Encounter (Signed)
 Pt was called to be notified her prep for her procedure on 09/02 will be sent to her local pharmacy since her procedure is scheduled less than 7 days away. Pt did not answer, was not able to leave a voicemail due to vm not being set up yet.

## 2023-09-24 NOTE — Progress Notes (Signed)
 [name], [place]  [facility]      Date Time: September 24, 2023 2:44 PM  Patient Name: Valerie May, Valerie May  Requesting Physician: Zik, [place] J, MD     Verbal consent obtained to record this visit    Reason for Consultation/Chief Complaint:     Hospital Follow-up, Nausea, Abdominal Pain, and Emesis    Assessment and Plan:   Valerie May is a 53 y.o. female w/ PMHx NET of pancreas, CVA, OSA, asthma, bronchiectasis, GERD, preDM, polycythemia, who presents to [facility] for constipation, n/v, early satiety  Assessment & Plan  Nausea and vomiting, Early satiety  Nausea and vomiting likely related to gastrointestinal issues, possibly exacerbated by GERD and suspected gastroparesis. Metoclopramide  prescribed after recent hospitalization and provided some relief.  Will hold off on prescribing now given patient needs gastric emptying study and this will affect the results  - Schedule 4-hour gastric emptying study  - Prescribe Zofran  (ondansetron ) sublingual tablet for nausea.  - Start pantoprazole  40 mg daily on an empty stomach    Constipation-predominant IBS  Chronic constipation with bowel movements every 3-4 days, accompanied by abdominal pain which is relieved by bowel movements. Previous use of Miralax  provided no relief.  - Prescribe Linzess  (linaclotide ) 145 mcg once daily to manage constipation.  - Titrate dose up or down pending response    CRC Screening, Family hx colon cancer  None prior. Mother died of colon cancer.  - Schedule Colonoscopy at [facility] w/ Suflave     Gastroesophageal reflux disease (GERD)  Recurrent GERD symptoms worsened since discontinuation of pantoprazole . Previously managed with pantoprazole .  - Prescribe pantoprazole  40 mg once daily in the morning on an empty stomach.    Obesity  Obesity contributing to overall health issues, including potential sleep apnea. She expressed desire to lose weight.  - Refer to Bariatric clinic for assistance with weight  loss.    Orders Placed This Encounter    NM Gastric Emptying    Referral to Bariatric Surgery and Obesity Medicine ([place] )    pantoprazole  (PROTONIX ) 40 MG tablet    linaCLOtide  (LINZESS ) 145 MCG capsule    ondansetron  (ZOFRAN -ODT) 4 MG disintegrating tablet    Na sulfate, K sulfate, Mg sulfate (SUPREP) oral solution       FOLLOW UP:  Return in about 6 weeks (around 11/05/2023).     History:     History of Present Illness  Valerie May is a 53 year old female with a pancreatic tumor who presents with nausea and vomiting. She was referred by Dr. Junita for evaluation of nausea and vomiting.    Nausea and vomiting  - Persistent nausea and vomiting  - Feeling of being full  - RUQ discomfort  - Symptoms improved with metoclopramide  prescribed after recent hospitalization  - Previously on pantoprazole  but has recently been off due to running out    Gastroesophageal reflux and heartburn  - Heartburn and reflux present  - Occasionally takes Tums and Maalox without relief  - Previously on pantoprazole , but not currently taking it    Constipation and bowel habits  - Constipation with bowel movements every three to four days  - Occasionally requires manual evacuation  - Minimal relief with Miralax   - Abdominal pain improves after bowel movement    Pancreatic tumor  - Known pancreatic NET     Family Hx GI related cancers - mother colon cancer, uncle colon cancer    The patient denies fevers, weight loss, dysphagia, odynophagia, diarrhea,  hematemesis, melena, or hematochezia.     Anticoagulants - none    Results      Lab Results   Component Value Date    WBC 8.35 04/13/2023    HGB 13.7 04/13/2023    HCT 40.4 04/13/2023    MCV 88.0 04/13/2023    PLT 357 (H) 04/13/2023    CREAT 0.6 04/13/2023    EGFR >60.0 04/13/2023    AST 37 04/12/2023    ALT 57 (H) 04/12/2023    ALKPHOS 108 04/12/2023    ALB 3.7 04/12/2023    BILITOTAL 0.4 04/12/2023    BILIDIRECT 0.2 04/08/2023    BILIINDIRECT 0.3 04/08/2023    LIP 28 04/11/2023    TSH  0.891 05/08/2023    T4FREE 0.82 03/22/2023    TTGIGA <1.0 03/30/2023    ESR 35 (H) 04/09/2023    CRP 0.3 04/08/2023     Lab Results   Component Value Date    B12 554 03/23/2023    STLOB Negative 04/09/2023     ENDOSCOPIES:  EGD EUS 04/10/23:  EGD - Normal stomach. - Normal examined duodenum. - No specimens collected.  EUS - A mass was identified in the pancreatic head. The staging applies if malignancy is confirmed. Fine needle aspiration performed. - There was no sign of significant pathology in the common bile duct. - Evidence of a cholecystectomy.    Repeat EUS attempted on 3/7, however, large amount of food residue was found in stomach and duodenum thus unable to perform EUS with FNB.    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 Fine needle biopsy performed --> inadequate tissue from FNB, GI asked to repeat biopsy.       GI PATHOLOGY:  FNA 04/10/23:  A. Pancreas, head, mass, endoscopic ultrasound-guided fine-needle aspiration:             - Well differentiated neuroendocrine tumor, see comment.     03/25/23:  A. Esophagus, brushing:             - Candida esophagitis.    RADIOLOGY:  CT Abdomen Pelvis W IV/ WO PO Cont 08/08/2023  LOWER CHEST:  Lung bases are clear.  LIVER:  Unremarkable.  GALLBLADDER/BILE DUCTS:  Gallbladder is surgically absent.  PANCREAS:  Unremarkable.  SPLEEN:  Unremarkable.  ADRENAL GLANDS:  Unremarkable.  KIDNEYS / URETERS:  On 0.6 cm low-attenuation lesion at the right kidney  measures greater than water  attenuation and not fully characterized. Small  calculi in both kidneys. No hydronephrosis.  BOWEL / MESENTERY:  Diverticula throughout the colon..   No bowel obstruction.  APPENDIX:  Identified and normal.  No evidence of acute appendicitis.  PERITONEUM: No free air.    No free fluid.  VESSELS:  Abdominal aorta is normal caliber.  RETROPERITONEUM:  Unremarkable.  REPRODUCTIVE ORGANS:  Uterus not identified.  BLADDER: Unremarkable.  ABDOMINAL WALL:  Unremarkable.  BONES:   No acute abnormality.  OTHER: None.  ___________________________________    Impression  1. Colonic diverticulosis without evidence of acute diverticulitis.  2. Bilateral nephrolithiasis without hydronephrosis.  3. Incidental right renal lesion 1.6 cm indeterminate. Follow-up imaging with  MRI recommended for further characterization.  CT chest reported separately.    CT A/P 04/09/23:  1.Minimal fat stranding/fluid adjacent to the pancreatic head may represent  postbiopsy changes. Recommend correlation with lipase to exclude acute  pancreatitis.  2.Slightly increased size of the pancreatic head lesion, potentially  reflecting intralesional hemorrhage post biopsy.    MRI  ABD 03/23/23:  1.Pancreatic head 1.2 cm enhancing lesion, suspected neoplasm. This lesion  appears confined of the pancreas and demonstrates no major vascular  interfaces. Recommend EUS for further evaluation.  2.No findings suspicious for metastatic disease within the imaged abdomen.  3.Mid right renal 1.7 cm cyst, recently demonstrating intermediate density  by CT and is benign.  4.Hepatic steatosis.  5.Colonic diverticulosis.    CT A/P 03/21/23:  1.No acute abnormality within the chest, abdomen, or pelvis.  2.No findings of acute pulmonary embolism within the limitations stated  above.  3.Hyperdense pancreatic head lesion. Further evaluation with MRI/MRCP is  recommended.  4.Mildly hyperdense right renal lesion, possible hemorrhagic/proteinaceous  cyst. This can also be further evaluated on MRI.  5.Additional chronic findings as above.    Past Medical History:     Past Medical History:   Diagnosis Date    COVID-19 2021    Kidney stone     OSA (obstructive sleep apnea)     Pneumonia     Uterine leiomyoma 10/11/2017       Past Surgical History:   Past Surgical History[1]    Family History:   Family History[2]    Social History:   Social History[3]     Allergies:   Allergies[4]    Medications:   Current Medications[5]    Review of Systems:     Negative  except as stated in HPI    Physical Exam:     Vitals:    09/24/23 1348   BP: 118/84   BP Site: Left arm   Patient Position: Sitting   Cuff Size: Large   Pulse: 93   Weight: 76.1 kg (167 lb 11.2 oz)   Height: 1.499 m (4' 11)        Body mass index is 33.87 kg/m.    General appearance - Well developed and well nourished. Normal gait.  HEENT- Normocephalic. Sclera anicteric. Normal hearing. Nares normal.  Oropharynx normal.  Neck - Supple.  No thyromegaly.  No cervical lymphadenopathy.  Chest - clear to percussion and auscultation.  No wheeze, rhonchi, or rales.  Heart - regular rate and rhythm without murmurs, gallops, or rubs.  Abdomen - normal bowel sounds.  No focal tenderness to palpation. Abdomen is soft. No rebound or guarding. No hepatosplenomegaly.  No masses. No ascites.  Rectal - deferred until time of colonoscopy.  Musculoskeletal - normal range of motion of arms and legs.  Extremities - no clubbing, cyanosis, or edema.  Skin - no rashes or lesions.  Neurologic - Alert and oriented to person, place and time.  No focal motor or sensory deficits.  No asterixis.  Recent and remote memory normal.       All of the above was reviewed and discussed with the patient.  Face-to-face time was spent in patient education counseling and coordination of care. Additionally, differential diagnoses, suggested diagnostic work-up and potential treatment options were discussed. Questions and concerns were addressed. Will proceed as noted and follow clinically.The patient verbalized understanding of the assessment and plan with verbalized understanding and all questions answered. Patient education provided. Potential medication side effects discussed with patient including risk and benefits. Further recommendations based on clinical progression and test results. Contact information provided and instructed to inform us  with changes.     I spent 28 minutes of total time in patient care.  This includes the time spent reviewing  pathology and imaging reports prior to the encounter as well as time spent discussing the care with  other physicians and also face to face time with the patient. This time excludes any separately reportable procedures.    Signed by:  [name], PA-C  [facility]       Dr. Rexford ref. provider found, thank you for referring Valerie May to [facility]. It was a pleasure to see her and to participate in her care. Please do not hesitate to reach out to me if you have any clinical concerns to discuss.       CC:               [1]   Past Surgical History:  Procedure Laterality Date    CYST REMOVAL      ESOPHAGOGASTRODUODENOSCOPY (EGD), ESOPHAGEAL ULTRASOUND (EUS) LIMITED TO THE ESOPHAGUS, STOMACH OR DUODENUM N/A 04/10/2023    Procedure: ESOPHAGOGASTRODUODENOSCOPY (EGD), ESOPHAGEAL ULTRASOUND (EUS) LIMITED TO THE ESOPHAGUS, STOMACH OR DUODENUM;  Surgeon: Abagail Carole HERO, MD;  Location: [facility];  Service: Gastroenterology;  Laterality: N/A;    ESOPHAGOGASTRODUODENOSCOPY (EGD), ESOPHAGEAL ULTRASOUND (EUS) WITH FINE NEEDLE ASPIRATION/BIOPSY OF ESOPHAGUS, STOMACH, OR DUODENUM N/A 03/25/2023    Procedure: ESOPHAGOGASTRODUODENOSCOPY (EGD), ESOPHAGEAL ULTRASOUND (EUS) WITH FINE NEEDLE ASPIRATION/BIOPSY OF ESOPHAGUS, STOMACH, OR DUODENUM;  Surgeon: Abagail Carole HERO, MD;  Location: [facility];  Service: Gastroenterology;  Laterality: N/A;    ESOPHAGOGASTRODUODENOSCOPY (EGD), ESOPHAGEAL ULTRASOUND (EUS) WITH FINE NEEDLE ASPIRATION/BIOPSY OF ESOPHAGUS, STOMACH, OR DUODENUM N/A 04/03/2023    Procedure: ESOPHAGOGASTRODUODENOSCOPY (EGD), ESOPHAGEAL ULTRASOUND (EUS) WITH FINE NEEDLE ASPIRATION/BIOPSY OF ESOPHAGUS, STOMACH, OR DUODENUM;  Surgeon: Ulysess Foots, MD;  Location: [facility];  Service: Gastroenterology;  Laterality: N/A;    GALLBLADDER SURGERY      HERNIA REPAIR      HYSTERECTOMY      TUBAL LIGATION     [2]   Family History  Problem Relation Name Age of Onset    Heart attack Mother       Diabetes Father      Heart attack Maternal Grandfather      Heart attack Paternal Grandfather      Stroke Son      Heart attack Maternal Uncle      Stomach cancer Cousin      Bone cancer Maternal Aunt      Colon cancer Neg Hx      Breast cancer Neg Hx     [3]   Social History  Tobacco Use    Smoking status: Never     Passive exposure: Current    Smokeless tobacco: Never   Vaping Use    Vaping status: Never Used   Substance Use Topics    Alcohol  use: Not Currently    Drug use: Never   [4]   Allergies  Allergen Reactions    Benadryl  [Diphenhydramine ]     Dust Mite Extract     Methocarbamol Itching    Molds & Smuts     Other     Compazine [Prochlorperazine] Anxiety   [5]   Current Outpatient Medications:     acetaminophen  (TYLENOL ) 650 MG CR tablet, TAKE 1 TABLET BY MOUTH EVERY 8 HOURS AS NEEDED FOR PAIN, Disp: 90 tablet, Rfl: 1    albuterol  sulfate HFA (PROVENTIL ) 108 (90 Base) MCG/ACT inhaler, Inhale 1-2 puffs into the lungs every 4 (four) hours as needed, Disp: , Rfl:     benzonatate  (TESSALON ) 200 MG capsule, Take 1 capsule (200 mg) by mouth 3 (three) times daily as needed for Cough, Disp: , Rfl:     Budeson-Glycopyrrol-Formoterol  160-9-4.8 MCG/ACT Aerosol,  Inhale 2 puffs into the lungs 2 (two) times daily, Disp: 10.7 g, Rfl: 5    budesonide -formoterol  (SYMBICORT ) 160-4.5 MCG/ACT inhaler, Inhale 2 puffs into the lungs 2 (two) times daily, Disp: 1 each, Rfl: 0    cetirizine  (ZyrTEC ) 10 MG tablet, Take 1 tablet (10 mg) by mouth daily, Disp: , Rfl:     diphenhydrAMINE -APAP, sleep, (Excedrin PM) 38-500 MG Tab, Take by mouth daily, Disp: , Rfl:     DULoxetine  (CYMBALTA ) 30 MG capsule, Take 1 capsule (30 mg) by mouth daily for 14 days, THEN 2 capsules (60 mg) daily., Disp: 194 capsule, Rfl: 0    ergocalciferol  (ERGOCALCIFEROL ) 1.25 MG (50000 UT) capsule, Take 1 capsule (50,000 Units) by mouth once a week, Disp: 12 capsule, Rfl: 1    escitalopram  (LEXAPRO ) 10 MG tablet, Take 1 tablet (10 mg) by mouth once daily, Disp: 90  tablet, Rfl: 3    gabapentin  (NEURONTIN ) 300 MG capsule, Take 1 capsule (300 mg) by mouth once nightly, Disp: 30 capsule, Rfl: 1    lidocaine  (LIDODERM ) 5 %, Place 1 patch onto the skin in the morning. Remove & Discard patch within 12 hours or as directed by MD., Disp: 30 patch, Rfl: 0    meloxicam (MOBIC) 15 MG tablet, Take 1 tablet (15 mg) by mouth once daily, Disp: , Rfl:     metoclopramide  (REGLAN ) 5 MG tablet, Take 1 tablet (5 mg) by mouth 3 (three) times daily with meals for 20 days, Disp: 30 tablet, Rfl: 1    montelukast  (SINGULAIR ) 10 MG tablet, Take 1 tablet (10 mg) by mouth nightly, Disp: 90 tablet, Rfl: 3    polyethylene glycol (MIRALAX ) 17 GM/SCOOP powder, Take 17 g by mouth daily, Disp: 90 each, Rfl: 3    senna-docusate (PERICOLACE) 8.6-50 MG per tablet, Take 2 tablets by mouth nightly, Disp: 60 tablet, Rfl: 0    SUMAtriptan  (IMITREX ) 25 MG tablet, Take 1 tablet (25 mg) by mouth every 2 (two) hours as needed for Migraine, Disp: 9 tablet, Rfl: 3    linaCLOtide  (LINZESS ) 145 MCG capsule, Take 1 capsule (145 mcg) by mouth once daily, Disp: 30 capsule, Rfl: 2    Na sulfate, K sulfate, Mg sulfate (SUPREP) oral solution, Take 354 mLs by mouth once for 1 dose Follow prep instructions provided by your Gastroenterologist, Disp: 1 each, Rfl: 0    ondansetron  (ZOFRAN -ODT) 4 MG disintegrating tablet, Dissolve 1 tablet (4 mg) in the mouth every 8 (eight) hours as needed for Nausea, Disp: 20 tablet, Rfl: 1    pantoprazole  (PROTONIX ) 40 MG tablet, Take 1 tablet (40 mg) by mouth once daily, Disp: 30 tablet, Rfl: 2    umeclidinium bromide  (INCRUSE ELLIPTA ) 62.5 MCG/ACT Aerosol Pwdr, Breath Activated, Inhale 1 puff into the lungs daily (Patient not taking: Reported on 09/24/2023), Disp: 1 each, Rfl: 0

## 2023-09-24 NOTE — Patient Instructions (Addendum)
 It was a pleasure taking care of you today!     -Go to [facility] for weight loss referral placed    To schedule your Gastric Emptying Study procedure please call your chosen Facility's Central Scheduling Number:   [facility] Scheduling [phone]   [facility] Scheduling [phone]     -Start Pantoprazole  40mg  every morning on an empty stomach, wait 30 minutes before eating or drinking  -Zofran  as needed for nausea/vomiting    -Start Linzess  72 mcg daily - take this 30 minutes before lunch or dinner.  -Stop other constipation medications (miralax , magnesium  citrate, senna, colace, etc.) once you start Linzess .  -It is OK to continue fiber supplementation daily.   -Diarrhea is common in the first 1-2 weeks of starting Linzess . After 2 weeks the diarrhea should improve.  -If diarrhea/loose stools last longer than 2 weeks, message me through MyChart to let me know  -Increase dietary soluble fiber intake (try prunes and kiwi). Drink plenty of water ; at least 64 oz of water  a day. Exercise.  -Attempt to go to the bathroom at the same time every day. Best time to have bowel movement is 30 minutes after meals and in the morning after waking up.   -Start bowel movement journal.     -Schedule your Colonoscopy (see instructions below)  at [facility]   -You will receive bowel prep instructions from a specialty pharmacy called [facility]. If they text or call you (typically from an 43 number), this is not spam. Please respond so they can ship you your bowel prep medication. If your copay is too high, please call [facility] and ask for additional bowel prep options.  -For optimal bowel prep, try to avoid seeds, nuts, beans, corn, and dried fruit 7 days prior to procedure.  -Recommend taking Miralax  once daily for 5 days leading up to the procedure (prior to the bowel prep) given your history of constipation    -If between this office visit and scheduled endoscopic procedures,  if you have any of the following: new medical conditions or symptoms, changes in existing medical conditions, emergency room visits, or hospital admissions, please message GI provider through my chart or call GI clinic at [phone] immediately.       Please MyChart message me with any questions or concerns.    [name], PA-C  [facility]       Colonoscopy Preparation Instructions - SUPREP    IF YOU ARE ON BLOOD THINNERS (COUMADIN, PLAVIX, etc.), INSULIN  OR OTHER   DIABETIC MEDICATIONS, PLEASE LET US  KNOW AND CHECK WITH YOUR   PRESCRIBING PHYSICIAN FOR INSRUCTIONS. Your prescribing provider needs to   determine if you should stop or stay on your blood thinner before procedure.  Not following these instructions may result in cancellation.    General Endoscopy Information   Do no chew gum or suck on hard candy the day of your procedure.   You must have a responsible adult to accompany you home after the procedure. This   person must pick you up in the endoscopy unit. If you do not have a responsible adult   to accompany you home, your procedure will be cancelled.    You may not operate a motor vehicle for the remainder of the day following your   procedure.    You may not take a taxi, [place] or bus home unless accompanied by a responsible   adult.    If your insurance company  requires a REFERRAL, YOU MUST BRING IT WITH YOU.   Also, please bring your current insurance card(s), copay (if applicable), and a current   picture ID with you on the day of your procedure.    Our office will contact your insurance carrier to verify coverage and, if required, obtain   preauthorization for your procedure. However, preauthorization is not a guarantee of   payment and you will be responsible for any deductibles, copays, co-insurance, and/or   any other plan specific out-of-pocket expenses.    Dependent upon your family history, personal history, prior gastroenterology   diagnoses, or findings discovered during  your colonoscopy, your procedure may be   considered preventative or diagnostic. This determination will not be made until after   the procedure has concluded and will be based upon the findings of your exam. In our   experience, many insurance carriers cover preventative and diagnostic colonoscopies   differently, and as a result, your out-of-pocket payment may also differ. If you have any   questions about your coverage, please contact your insurance carrier directly.   If you have any questions, please contact your GI physician's office during normal business   hours.    SUPREP PREP KIT  SUPREP Bowel Prep Kit Is taken as a split dose (2-day) regimen. You take the first 6-ounce ounces bottle of SUPREP the evening before your colonoscopy at 5:00 PM. You will take the second 6-ounce bottle of SUPREP the morning of your colonoscopy (5 hours before your procedure). It is important to drink the additional water  as recommended in the instructions for use. Both 6-ounce bottles are required for a complete prep.    The day before the procedure:  1. Drink only clear liquids the entire day. No solid food should be taken. Clear liquids include  water , broth without any solid pieces, apple juice, white grape juice, pulp free lemonade,   sprite, ginger-ale, coffee or tea without milk or non-dairy creamers, plain Jello (no added   fruit or toppings, no red, purple, or blue Jello. See clear liquid diet below).  DO NOT drink milk  DO NOT eat or drink anything colored red or purple  DO NOT drink alcoholic beverages   2. Complete steps 1 through 4 using (1) 6-ounce bottle starting at 5:00 p.m.:   Step 1:  Pour ONE (1) 6-ounce bottle of SUPREP liquid into the mixing container.  Step 2:  Add cool drinking water  to the 16-ounces line on the container and mix.  Step 3:  Drink ALL the liquid in the container.   Step 4:  You must drink two (2) more 16-ounces containers of water  over the next 1 hour.  3. No solid food should be taken  during or after the prep.     The day of your procedure:  1. 5 hours before your procedure time: Start 2nd dose of SUPREP and complete steps 1-4   using the other 6-ounce bottle:  Step 1:  Pour ONE (1) 6-ounce bottle of SUPREP liquid into the mixing container.   Step 2:  Add cool drinking water  to the 16-ounce line on the container and mix.  Step 3:  Drink ALL the liquid in the container.  Step 4:  You must drink two (2) more 16-ounce containers of water  over the next 1 hour.   NOTE: you must finish drinking the final glass of water  at least 4 hours, or as directed, before  your procedure.  2. It is important that  you finish the remaining dose at least 4 hours before your scheduled   procedure.   3. Do not take anything by mouth starting 4 hours before your procedure.  4. Do not eat hard candy or chew gum.   Wear comfortable clothing that is easy to remove and leave jewelry at home. Leave any   valuables (i.e., purse, cell phone etc.) with family or companion.  5. Report to the Endoscopy Procedure Unit 90 minutes before your scheduled procedure   time.   6. Once in the pre-procedural area, you will be asked to put on a hospital gown. A nurse will   review your medical history with you (Bring a list of your current medication, allergies, and   a copy of your recent EKG). An intravenous line (IV) will be started for your sedation   during the procedure.   7. When your procedure is done, you may remain in the recovery room for up to 1 hour.   8. Your doctor will discuss the results of your procedure with you and give you a copy of your   report.   * It is not uncommon for individuals to experience bloating or nausea when drinking a solution.  If have vomiting or other concerning symptoms, please call your GI physician's office.    PLEASE NOTE: It is extremely important to follow the preparation listed above, so that   the doctor will be able to see your entire colon. Your colon must be clear of any stool.   Inadequate  preparation limits the value of this procedure and could necessitate   rescheduling the examination.   * If you need to reschedule or cancel your appointment, please call your GI physician's  office.     FREQUENTLY ASKED QUESTIONS:  My procedure is in the afternoon. Can I eat in the morning?   A: No. To ensure your safety during the procedure, it is important that the stomach is empty.   Any food or liquid in the stomach at the time of the procedure places you at risk of aspirating   these contents into the lung leading to a serious complication called aspiration pneumonia.   You can drink water  until 4 hours before your procedure time.     I ate breakfast (lunch or dinner) the day before my colonoscopy. Is that okay?   A: If the preparation instructions were not followed properly, residual stool may remain in the   colon and hide important findings from the examining physician. In some cases, if the colon   preparation is not good, you may have to repeat the preparation and the exam. If you   accidentally eat any solid food the day before your exam, please call and ask to speak with a   member of the nursing staff. You may be asked to reschedule your procedure.    I don't have a ride. Is that okay?   A: No. If you do not have a responsible adult to accompany you home, YOUR PROCEDURE   WILL BE CANCELLED.    How many days prior to the procedure should I discontinue my Coumadin, Plavix or   other blood thinning medications?   A: If you are taking any blood thinning medications please let your endoscopist know.   Generally speaking, you should quit taking your blood thinning medication 2-7 days prior to   some procedures depending on the medication; however, you must check with your   endoscopist and your prescribing  physician to ensure that it is needed and safe for you to do   so.     What medications can I take the day before and the day of my procedure?  A: The day prior to your procedure take your medications the  way you normally would.   However, for those patients taking any type of bowel cleansing preparation, be advised that   you may undergo a prolonged period of diarrhea that may flush oral medications out of your system before they have time to take effect. The morning of your procedure you should take any blood pressure or heart medications you may be on with a small sip of water . You can hold most other medications and take them once your procedure has been completed. If you have questions about specific medication(s), please call a member of the endoscopy unit clinical staff.     I am diabetic. Do I take my insulin ?   A: You must direct the question to the physician who placed you on this medication. Please   check your blood sugar the morning of your procedure as you normally would. If you have any questions about your diabetes management in conjunction with your fast for your endoscopic procedure, please consult your primary physician.    I am on pain medication? Can I take it prior to my procedure?   A: You may take your prescription pain medications prior to your procedure with a small sip of water . Please inform the pre-procedural clinical staff of any medications you've taken the day of your procedure. If you have any questions, please call a member of the endoscopy unit clinical staff.     I'm having a menstrual period. Should I reschedule my colonoscopy appointment?   A: No. Your menstrual period will not interfere with your physician's ability to complete your   procedure.     May I continue taking my iron tablets?   A: No. Iron may cause the formation of dark color stools which can make it difficult for the   physician to complete your colonoscopy if your preparation is less than optimal. We   recommend you stop taking your oral iron supplements at least one week prior to your   procedure.     I have been on Aspirin  therapy for my heart. Should I continue to take it?   A: Aspirin  may affect blood  coagulation. Please check with your endoscopist and prescribing   physician.     I am having a colonoscopy tomorrow. I started my colon preparation on time but now I   am experiencing diarrhea and/or a bloating feeling. What should I do?   A: Nausea, vomiting and a sense of fullness or bloating can occur any time after beginning   your colon preparation. However, it is important that you drink all the preparation. For most   people, taking an hour break from the preparation will usually help. Then continue taking the preparation as ordered. If the vomiting returns or symptoms get worse, please call your GI physician's office.    If you have any questions or concerns, please don't hesitate to call us  at [phone] or [phone], option [phone].    Sincerely,  [facility]ology Team
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
## 2023-09-25 ENCOUNTER — Telehealth (INDEPENDENT_AMBULATORY_CARE_PROVIDER_SITE_OTHER): Payer: Self-pay

## 2023-09-25 ENCOUNTER — Ambulatory Visit (INDEPENDENT_AMBULATORY_CARE_PROVIDER_SITE_OTHER): Admitting: Family Medicine

## 2023-09-25 ENCOUNTER — Other Ambulatory Visit: Payer: Self-pay | Admitting: Physician Assistant

## 2023-09-25 VITALS — BP 127/84 | HR 100 | Temp 98.3°F | Ht 59.02 in | Wt 167.1 lb

## 2023-09-25 DIAGNOSIS — G8929 Other chronic pain: Secondary | ICD-10-CM

## 2023-09-25 DIAGNOSIS — M549 Dorsalgia, unspecified: Secondary | ICD-10-CM

## 2023-09-25 DIAGNOSIS — M2550 Pain in unspecified joint: Secondary | ICD-10-CM

## 2023-09-25 DIAGNOSIS — F418 Other specified anxiety disorders: Secondary | ICD-10-CM

## 2023-09-25 DIAGNOSIS — R112 Nausea with vomiting, unspecified: Secondary | ICD-10-CM

## 2023-09-25 MED ORDER — ESCITALOPRAM OXALATE 10 MG PO TABS
10.0000 mg | ORAL_TABLET | Freq: Every day | ORAL | 3 refills | Status: AC
Start: 2023-09-25 — End: ?

## 2023-09-25 MED ORDER — IBUPROFEN 600 MG PO TABS
600.0000 mg | ORAL_TABLET | Freq: Four times a day (QID) | ORAL | 1 refills | Status: AC | PRN
Start: 2023-09-25 — End: ?

## 2023-09-25 MED ORDER — ONDANSETRON 4 MG PO TBDP: 4.0000 mg | ORAL_TABLET | Freq: Three times a day (TID) | ORAL | 1 refills | Status: DC | PRN

## 2023-09-25 MED ORDER — TRAMADOL HCL 50 MG PO TABS
50.0000 mg | ORAL_TABLET | Freq: Four times a day (QID) | ORAL | 0 refills | Status: AC | PRN
Start: 2023-09-25 — End: 2023-10-02

## 2023-09-25 MED ORDER — PEG 3350-KCL-NABCB-NACL-NASULF 236 G PO SOLR
1.0000 | Freq: Once | ORAL | 0 refills | Status: AC
Start: 2023-09-25 — End: 2023-09-25

## 2023-09-25 NOTE — Telephone Encounter (Signed)
 Name, strength, directions of requested refill(s):    Colo prep   Abdominal pain med     How much medication is remaining:     Pharmacy to send refill to or patient to pick up rx from office (mark requested pharmacy in BOLD):      [facility] [address], [place] - [address]  [address]  [place] [address]  Phone: [phone] Fax: [phone]      Please mark X next to the preferred call back number:    Mobile: [phone] (mobile) x   Home: [phone] (home)    Work: @WORKPHONE @        Medication refill request, see above. Thank you   Patient has been informed that medication refill requests should be called in up to one week prior to running out of medication.    Additional Notes:pt states her prep is requiring a PA but has procedure on 9/2 also requesting med for abdominal pain     Next Visit: MM/DD/YYYY

## 2023-09-25 NOTE — Progress Notes (Signed)
 Language Spoken: English and Spanish  Interpreter ID#:  n/a, fluent in patient's spoken language    HPI:   This is a 53 y.o. female Hx of Chronic Constipation, Well-differentiated neuroendocrine tumor, Chronic abdominal pain, Chronic back pain    RUQ Abdominal pain since Feb 2025   Very painful/hurts even when wears bra   Nausea, bloating, muscle spasm  Dx pancreatic mass  Has f/u 9/28    Started working retail about 1.5 months ago  Having lots of pain in the legs  Character: achy/throbbing  No improvements in rest  Saw the Ortho yesterday  Has hernia in the disc  Having to work since disability got denied/says [place] did not fwd her records     Medical History[1]  Past Surgical History[2]  Family History[3]  Social History[4]    ALLERGIES:   Allergies[5]    MEDICATIONS:   Current Medications[6]    PHYSICAL EXAM:   BP 127/84 (BP Site: Left arm, Patient Position: Sitting, Cuff Size: Large)   Pulse 100   Temp 98.3 F (36.8 C) (Oral)   Ht 1.499 m (4' 11.02)   Wt 75.8 kg (167 lb 2 oz)   LMP 03/18/2012   SpO2 93%   BMI 33.74 kg/m     Physical Exam    General: awake, alert, oriented x 3  Cardiovascular: S1, S2, regular rate and rhythm, no murmurs, rubs, or gallops  Lungs: clear to auscultation bilaterally, without wheezing, rhonchi, or rales  Abdomen: soft, non tender, normal bowel sounds  Extremities: no clubbing, cyanosis, or edema, normal gait, full ROM/strength in limbs  Skin: no rashes or lesions noted    DIAGNOSTICS:     Lab Results   Component Value Date    WBC 8.35 04/13/2023    HGB 13.7 04/13/2023    HCT 40.4 04/13/2023    PLT 357 (H) 04/13/2023    CHOL 190 11/07/2020    TRIG 122 11/07/2020    HDL 56 11/07/2020    LDL 110 (H) 11/07/2020    ALT 57 (H) 04/12/2023    AST 37 04/12/2023    NA 137 04/13/2023    K 4.0 04/13/2023    CL 104 04/13/2023    CREAT 0.6 04/13/2023    BUN 8 04/13/2023    CO2 25 04/13/2023    TSH 0.891 05/08/2023    INR 1.2 04/10/2023    GLU 110 (H) 04/13/2023    HGBA1C 6.0 (H)  03/22/2023    ALKPHOS 108 04/12/2023       No results found.    ACTIVE PROBLEMS:   Problem List[7]    ASSESSMENT and PLAN:   53 y.o. female Hx of Chronic Constipation, Well-differentiated neuroendocrine tumor, Chronic abdominal pain, Chronic back pain      Valerie May was seen today for abdominal pain.    Diagnoses and all orders for this visit:    Chronic right-sided back pain, unspecified back location discussed we only treat acute pain, refill on controlled muscle relaxer is a one time thing. If you need refills in the future we will be able to place referral to Pain Management.   -     traMADol  (ULTRAM ) 50 MG tablet; Take 1 tablet (50 mg) by mouth every 6 (six) hours as needed for Pain  -     ibuprofen  (ADVIL ) 600 MG tablet; Take 1 tablet (600 mg) by mouth every 6 (six) hours as needed for Pain    Nausea and vomiting, unspecified vomiting type  -  ondansetron  (ZOFRAN -ODT) 4 MG disintegrating tablet; Dissolve 1 tablet (4 mg) in the mouth every 8 (eight) hours as needed for Nausea    Diffuse arthralgia  -     Sedimentation Rate (ESR); Future  -     Rheumatoid Factor  -     Uric Acid  -     ANA IFA with Reflex to Titer/Pattern/Antibody  -     CBC without Differential; Future  -     Comprehensive Metabolic Panel; Future  -     ibuprofen  (ADVIL ) 600 MG tablet; Take 1 tablet (600 mg) by mouth every 6 (six) hours as needed for Pain    Depression with anxiety  -     escitalopram  (LEXAPRO ) 10 MG tablet; Take 1 tablet (10 mg) by mouth once daily    Discussed with patient that [place] medical records can fwd needed records, we cannot print her full chart at our clinic.     Follow up in 1 month to review results      Tinnie Agent, MD  09/25/2023  11:55 AM           [1]   Past Medical History:  Diagnosis Date    COVID-19 2021    Kidney stone     OSA (obstructive sleep apnea)     Pneumonia     Uterine leiomyoma 10/11/2017   [2]   Past Surgical History:  Procedure Laterality Date    CYST REMOVAL      ESOPHAGOGASTRODUODENOSCOPY  (EGD), ESOPHAGEAL ULTRASOUND (EUS) LIMITED TO THE ESOPHAGUS, STOMACH OR DUODENUM N/A 04/10/2023    Procedure: ESOPHAGOGASTRODUODENOSCOPY (EGD), ESOPHAGEAL ULTRASOUND (EUS) LIMITED TO THE ESOPHAGUS, STOMACH OR DUODENUM;  Surgeon: Abagail Carole HERO, MD;  Location: [facility];  Service: Gastroenterology;  Laterality: N/A;    ESOPHAGOGASTRODUODENOSCOPY (EGD), ESOPHAGEAL ULTRASOUND (EUS) WITH FINE NEEDLE ASPIRATION/BIOPSY OF ESOPHAGUS, STOMACH, OR DUODENUM N/A 03/25/2023    Procedure: ESOPHAGOGASTRODUODENOSCOPY (EGD), ESOPHAGEAL ULTRASOUND (EUS) WITH FINE NEEDLE ASPIRATION/BIOPSY OF ESOPHAGUS, STOMACH, OR DUODENUM;  Surgeon: Abagail Carole HERO, MD;  Location: [facility];  Service: Gastroenterology;  Laterality: N/A;    ESOPHAGOGASTRODUODENOSCOPY (EGD), ESOPHAGEAL ULTRASOUND (EUS) WITH FINE NEEDLE ASPIRATION/BIOPSY OF ESOPHAGUS, STOMACH, OR DUODENUM N/A 04/03/2023    Procedure: ESOPHAGOGASTRODUODENOSCOPY (EGD), ESOPHAGEAL ULTRASOUND (EUS) WITH FINE NEEDLE ASPIRATION/BIOPSY OF ESOPHAGUS, STOMACH, OR DUODENUM;  Surgeon: Ulysess Foots, MD;  Location: [facility];  Service: Gastroenterology;  Laterality: N/A;    GALLBLADDER SURGERY      HERNIA REPAIR      HYSTERECTOMY      TUBAL LIGATION     [3]   Family History  Problem Relation Name Age of Onset    Heart attack Mother      Diabetes Father      Heart attack Maternal Grandfather      Heart attack Paternal Grandfather      Stroke Son      Heart attack Maternal Uncle      Stomach cancer Cousin      Bone cancer Maternal Aunt      Colon cancer Neg Hx      Breast cancer Neg Hx     [4]   Social History  Tobacco Use    Smoking status: Never     Passive exposure: Current    Smokeless tobacco: Never   Vaping Use    Vaping status: Never Used   Substance Use Topics    Alcohol  use: Not Currently    Drug use: Never   [5]   Allergies  Allergen  Reactions    Benadryl  [Diphenhydramine ]     Dust Mite Extract     Methocarbamol Itching    Molds & Smuts     Other     Compazine  [Prochlorperazine] Anxiety   [6]   Current Outpatient Medications:     acetaminophen  (TYLENOL ) 650 MG CR tablet, TAKE 1 TABLET BY MOUTH EVERY 8 HOURS AS NEEDED FOR PAIN, Disp: 90 tablet, Rfl: 1    albuterol  sulfate HFA (PROVENTIL ) 108 (90 Base) MCG/ACT inhaler, Inhale 1-2 puffs into the lungs every 4 (four) hours as needed, Disp: , Rfl:     benzonatate  (TESSALON ) 200 MG capsule, Take 1 capsule (200 mg) by mouth 3 (three) times daily as needed for Cough, Disp: , Rfl:     Budeson-Glycopyrrol-Formoterol  160-9-4.8 MCG/ACT Aerosol, Inhale 2 puffs into the lungs 2 (two) times daily, Disp: 10.7 g, Rfl: 5    budesonide -formoterol  (SYMBICORT ) 160-4.5 MCG/ACT inhaler, Inhale 2 puffs into the lungs 2 (two) times daily, Disp: 1 each, Rfl: 0    celecoxib  (CeleBREX ) 100 MG capsule, Take 2 capsules (200 mg) by mouth once daily, Disp: 30 capsule, Rfl: 3    celecoxib  (CeleBREX ) 100 MG capsule, Take 2 capsules (200 mg) by mouth once daily, Disp: 30 capsule, Rfl: 3    cetirizine  (ZyrTEC ) 10 MG tablet, Take 1 tablet (10 mg) by mouth daily, Disp: , Rfl:     diphenhydrAMINE -APAP, sleep, (Excedrin PM) 38-500 MG Tab, Take by mouth daily, Disp: , Rfl:     ergocalciferol  (ERGOCALCIFEROL ) 1.25 MG (50000 UT) capsule, Take 1 capsule (50,000 Units) by mouth once a week, Disp: 12 capsule, Rfl: 1    escitalopram  (LEXAPRO ) 10 MG tablet, Take 1 tablet (10 mg) by mouth once daily, Disp: 90 tablet, Rfl: 3    gabapentin  (NEURONTIN ) 300 MG capsule, Take 1 capsule (300 mg) by mouth once nightly (Patient not taking: Reported on 09/24/2023), Disp: 30 capsule, Rfl: 1    ibuprofen  (ADVIL ) 600 MG tablet, Take 1 tablet (600 mg) by mouth every 6 (six) hours as needed for Pain, Disp: 30 tablet, Rfl: 1    lidocaine  (LIDODERM ) 5 %, Place 1 patch onto the skin in the morning. Remove & Discard patch within 12 hours or as directed by MD., Disp: 30 patch, Rfl: 0    lidocaine  (LIDODERM ) 5 %, Place 1 patch onto the skin in the morning. Remove & Discard patch within 12  hours or as directed by MD., Disp: 12 patch, Rfl: 2    lidocaine  (LIDODERM ) 5 %, Place 1 patch onto the skin in the morning. Remove & Discard patch within 12 hours or as directed by MD., Disp: 12 patch, Rfl: 2    linaCLOtide  (LINZESS ) 145 MCG capsule, Take 1 capsule (145 mcg) by mouth once daily, Disp: 30 capsule, Rfl: 2    meloxicam (MOBIC) 15 MG tablet, Take 1 tablet (15 mg) by mouth once daily (Patient not taking: Reported on 09/24/2023), Disp: , Rfl:     metoclopramide  (REGLAN ) 5 MG tablet, Take 1 tablet (5 mg) by mouth 3 (three) times daily with meals for 20 days, Disp: 30 tablet, Rfl: 1    montelukast  (SINGULAIR ) 10 MG tablet, Take 1 tablet (10 mg) by mouth nightly, Disp: 90 tablet, Rfl: 3    ondansetron  (ZOFRAN -ODT) 4 MG disintegrating tablet, Dissolve 1 tablet (4 mg) in the mouth every 8 (eight) hours as needed for Nausea, Disp: 20 tablet, Rfl: 1    pantoprazole  (PROTONIX ) 40 MG tablet, Take 1 tablet (40 mg)  by mouth once daily, Disp: 30 tablet, Rfl: 2    polyethylene glycol (MIRALAX ) 17 GM/SCOOP powder, Take 17 g by mouth daily, Disp: 90 each, Rfl: 3    senna-docusate (PERICOLACE) 8.6-50 MG per tablet, Take 2 tablets by mouth nightly (Patient not taking: Reported on 09/24/2023), Disp: 60 tablet, Rfl: 0    SUMAtriptan  (IMITREX ) 25 MG tablet, Take 1 tablet (25 mg) by mouth every 2 (two) hours as needed for Migraine, Disp: 9 tablet, Rfl: 3    traMADol  (ULTRAM ) 50 MG tablet, Take 1 tablet (50 mg) by mouth every 6 (six) hours as needed for Pain, Disp: 21 tablet, Rfl: 0    umeclidinium bromide  (INCRUSE ELLIPTA ) 62.5 MCG/ACT Aerosol Pwdr, Breath Activated, Inhale 1 puff into the lungs daily (Patient not taking: Reported on 09/24/2023), Disp: 1 each, Rfl: 0  [7]   Patient Active Problem List  Diagnosis    GERD (gastroesophageal reflux disease)    Lumbar spondylosis    OSA (obstructive sleep apnea)    Vitamin D  deficiency    History of CVA (cerebrovascular accident)    Asthma    Bronchiectasis without complication  (CMS/HCC)    Personal history of pneumonia (recurrent)    Chronic cough    Neuroendocrine tumor of pancreas (CMS/HCC)    Depression with anxiety    Migraines    Eosinophilia    Prediabetes

## 2023-09-25 NOTE — Telephone Encounter (Addendum)
 Pt was called to go over questions, concerns regarding yesterday's office visit. Pt states she only received the pantoprazole  & a prior authorization is needed for Linzess  & Suprep. Pt also needs to schedule to a Gastric Emptying study   To schedule your Gastric Emptying Study procedure please call your chosen Facility's Central Scheduling Number:   - [facility] Scheduling [phone]   [facility] Scheduling [phone]

## 2023-09-25 NOTE — Telephone Encounter (Signed)
 Pt was called to be notified her prep for her procedure on 09/02 will be sent to her local pharmacy since her procedure is scheduled less than 7 days away. Pt did not answer, was not able to leave a voicemail due to vm not being set up yet.

## 2023-09-25 NOTE — Addendum Note (Signed)
 Addended by: Harshal Sirmon on: 09/25/2023 02:53 PM     Modules accepted: Orders

## 2023-09-26 ENCOUNTER — Ambulatory Visit
Admission: RE | Admit: 2023-09-26 | Discharge: 2023-09-26 | Discharge status: Home / Self Care | Source: Ambulatory Visit | Attending: Physician Assistant

## 2023-09-26 ENCOUNTER — Ambulatory Visit
Admission: RE | Admit: 2023-09-26 | Discharge: 2023-09-26 | Discharge status: Home / Self Care | Source: Ambulatory Visit | Attending: Family

## 2023-09-26 ENCOUNTER — Encounter (INDEPENDENT_AMBULATORY_CARE_PROVIDER_SITE_OTHER): Payer: Self-pay

## 2023-09-26 DIAGNOSIS — M24851 Other specific joint derangements of right hip, not elsewhere classified: Secondary | ICD-10-CM | POA: Insufficient documentation

## 2023-09-26 DIAGNOSIS — G8929 Other chronic pain: Secondary | ICD-10-CM | POA: Insufficient documentation

## 2023-09-26 DIAGNOSIS — M25551 Pain in right hip: Secondary | ICD-10-CM | POA: Insufficient documentation

## 2023-09-26 DIAGNOSIS — M79604 Pain in right leg: Secondary | ICD-10-CM | POA: Insufficient documentation

## 2023-09-26 DIAGNOSIS — M545 Low back pain, unspecified: Secondary | ICD-10-CM | POA: Insufficient documentation

## 2023-09-27 ENCOUNTER — Telehealth (INDEPENDENT_AMBULATORY_CARE_PROVIDER_SITE_OTHER): Payer: Self-pay

## 2023-09-27 NOTE — Telephone Encounter (Signed)
 Spoke to patient to confirm procedure shceduled for September 2nd. Patient state that she does not have a ride and would like to cancel.

## 2023-09-28 ENCOUNTER — Encounter: Payer: Self-pay | Admitting: Anesthesiology

## 2023-09-29 NOTE — Telephone Encounter (Signed)
 Pt has been rescheduled to 10/07/23.

## 2023-09-29 NOTE — Telephone Encounter (Addendum)
 Patient called to cxl her procedure today with Dr. Durel and reschedule for a different day . States she did not receive her prep medication.     C.b [phone] patient requesting a spanish interpreter

## 2023-09-30 ENCOUNTER — Ambulatory Visit (HOSPITAL_BASED_OUTPATIENT_CLINIC_OR_DEPARTMENT_OTHER): Admitting: Orthopaedic Surgery

## 2023-10-01 ENCOUNTER — Encounter (INDEPENDENT_AMBULATORY_CARE_PROVIDER_SITE_OTHER): Payer: Self-pay | Admitting: Physician Assistant

## 2023-10-02 NOTE — Progress Notes (Deleted)
 History of present illness:      53 y/o female , presents into the office today for initial evaluation of ***    Referred by ***    Pain level **/10    For the pain, taking ***    *** Changes in bowel or bladder habits   There has been no saddle anesthesia, no incontinence of bowel or bladder, no weakness to lower legs, and no numbness to lower extremities.       Imaging to review:  MRI of the lumbar spine 09/26/2023  MRI of right hip 09/26/2023   X-rays of the lumbar spine 05/28/2023   MRI of the lumbar spine 10/16/2022- report only     Low back Oswestry (***/50) 10/05/2023   Neck disability Index (***/50)                           Medications: No new medications discussed.    Current Medications[1]     Allergies[2]     Past Medical History:   Diagnosis Date    COVID-19 2021    Kidney stone     OSA (obstructive sleep apnea)     Pneumonia     Uterine leiomyoma 10/11/2017        Past Surgical History[3]     12 Point Review of Systems:  noncontributory, except for what's listed in history.  ROS      Social History : No changes from listed in chart.     Tobacco Use: Medium Risk (09/24/2023)    Patient History     Smoking Tobacco Use: Never     Smokeless Tobacco Use: Never     Passive Exposure: Current            PHYSICAL EXAM:  Constitutional: Pt Looks comfortable, breathing well, in no distress.    Weight Status - Normal for age    Gait: Assistive devices - None    Tandem Gait/balance: Normal    Psychiatric:  PT appears to be alert and orientated with stable psychiatric evaluation  Waddell Signs: Negative    Musculoskeletal:      Cervical Spine:     Range of motion:  Flexion 1 to 2 fingerbreadths  Extension neutral to 70 degrees  Rotation 80 degrees/80 degrees  Spurling sign: Negative    Lumbar:   Range of Motion:  Flexion 0 to 6 inches above floor  Extension 10 to 20 degrees    Tenderness:  Midline Cervical Spine:  None  Midline Thoracic Spine:  None  Midline Lumbar Spine: None  Iliolumbar Region: None  SI joint  Region: None  Greater Trochanteric Region: None    Straight Leg Raising: Negative  Crossed SLR:  Negative  Femoral Nerve Stretch Test:  Negative  FABER test: Negative bilaterally  Hip range of motion: Without pain, full bilaterally    Neurologic:    Motor function upper and lower extremities:    Right:  Iliopsoas 5/5  Quadriceps 5/5  Anterior tibialis 5/5  EHL 5/5  Gastroc 5/5    Deltoid 5/5     Biceps 5/5  Triceps 5/5  Wrist ext 5/5  Wrist Flex 5/5  Grip 5/5  Interossei 5/5    Left:  Iliopsoas 5/5  Quadriceps 5/5  Anterior tibialis 5/5  EHL 5/5  Gastroc 5/5    Deltoid 5/5     Biceps 5/5  Triceps 5/5  Wrist ext 5/5  Wrist Flex 5/5     Grip  5/5  Interossei 5/5      Deep Tendon Reflexes:  2/2 Left knee, 2/2 Right Knee, 2/2 Left ankle, 2/2 Right ankle, 2/2 Left bicep, 2/2 Right bicep, and 2/2 Left tricep, 2/2 Right tricep.      Sensory Examination: Normal to light touch    Hoffmann:  Negative  Babinski:  Negative  Clonus: Negative      Tinels Wrist: Negative bilaterally            Ulnar: Negative bilaterally    Skin:  Without rashes/ lesions    Vasculature:  Capillary refill intact UE's, LE's  Radial pulses intact.    Hematologic/Lymph:  No evidence of Pitting Edema in LE's, No UE lymphedema, no signs of DVT or Homans sign, no calf pain or swelling.    Gastrointestinal:  Abdomen:  Soft/Non tender, No pulsatile masses.            Independently interpreted imaging Studies:            Imaging studies obtained during visit:            Assessment/Plan:      Exacerbation:      Due to diagnosis:                There were no vitals filed for this visit.          Valerie May, M.D. F.A.A.O.S.  Director - [facility] at [facility]  [facility]  [email or web address]        *The review of the patient's medications does not in any way constitute an endorsement, by this clinician, of their use, dosage, indications, route, efficacy, interactions, or other clinical  parameters.    It is customary for my notes to be completed with the assistance of a scribe for data entry, usually my physician assistant Jeoffrey Helena, Pa-C. However, all decisions and treatment options were rendered by myself after personal discussion of the relative historical, exam and radiological findings with the patient.     Please pardon any potential grammatical errors or typos as aspects of this note may have been created through speech-to-text software          [1]   Current Outpatient Medications:     acetaminophen  (TYLENOL ) 650 MG CR tablet, TAKE 1 TABLET BY MOUTH EVERY 8 HOURS AS NEEDED FOR PAIN, Disp: 90 tablet, Rfl: 1    albuterol  sulfate HFA (PROVENTIL ) 108 (90 Base) MCG/ACT inhaler, Inhale 1-2 puffs into the lungs every 4 (four) hours as needed, Disp: , Rfl:     benzonatate  (TESSALON ) 200 MG capsule, Take 1 capsule (200 mg) by mouth 3 (three) times daily as needed for Cough, Disp: , Rfl:     Budeson-Glycopyrrol-Formoterol  160-9-4.8 MCG/ACT Aerosol, Inhale 2 puffs into the lungs 2 (two) times daily, Disp: 10.7 g, Rfl: 5    budesonide -formoterol  (SYMBICORT ) 160-4.5 MCG/ACT inhaler, Inhale 2 puffs into the lungs 2 (two) times daily, Disp: 1 each, Rfl: 0    celecoxib  (CeleBREX ) 100 MG capsule, Take 2 capsules (200 mg) by mouth once daily, Disp: 30 capsule, Rfl: 3    celecoxib  (CeleBREX ) 100 MG capsule, Take 2 capsules (200 mg) by mouth once daily, Disp: 30 capsule, Rfl: 3    cetirizine  (ZyrTEC ) 10 MG tablet, Take 1 tablet (10 mg) by mouth daily, Disp: , Rfl:     diphenhydrAMINE -APAP, sleep, (Excedrin PM) 38-500 MG Tab, Take by mouth daily, Disp: , Rfl:     ergocalciferol  (ERGOCALCIFEROL )  1.25 MG (50000 UT) capsule, Take 1 capsule (50,000 Units) by mouth once a week, Disp: 12 capsule, Rfl: 1    escitalopram  (LEXAPRO ) 10 MG tablet, Take 1 tablet (10 mg) by mouth once daily, Disp: 90 tablet, Rfl: 3    gabapentin  (NEURONTIN ) 300 MG capsule, Take 1 capsule (300 mg) by mouth once nightly (Patient not  taking: Reported on 09/24/2023), Disp: 30 capsule, Rfl: 1    ibuprofen  (ADVIL ) 600 MG tablet, Take 1 tablet (600 mg) by mouth every 6 (six) hours as needed for Pain, Disp: 30 tablet, Rfl: 1    lidocaine  (LIDODERM ) 5 %, Place 1 patch onto the skin in the morning. Remove & Discard patch within 12 hours or as directed by MD., Disp: 30 patch, Rfl: 0    lidocaine  (LIDODERM ) 5 %, Place 1 patch onto the skin in the morning. Remove & Discard patch within 12 hours or as directed by MD., Disp: 12 patch, Rfl: 2    lidocaine  (LIDODERM ) 5 %, Place 1 patch onto the skin in the morning. Remove & Discard patch within 12 hours or as directed by MD., Disp: 12 patch, Rfl: 2    linaCLOtide  (LINZESS ) 145 MCG capsule, Take 1 capsule (145 mcg) by mouth once daily, Disp: 30 capsule, Rfl: 2    meloxicam (MOBIC) 15 MG tablet, Take 1 tablet (15 mg) by mouth once daily (Patient not taking: Reported on 09/24/2023), Disp: , Rfl:     metoclopramide  (REGLAN ) 5 MG tablet, Take 1 tablet (5 mg) by mouth 3 (three) times daily with meals for 20 days, Disp: 30 tablet, Rfl: 1    montelukast  (SINGULAIR ) 10 MG tablet, Take 1 tablet (10 mg) by mouth nightly, Disp: 90 tablet, Rfl: 3    ondansetron  (ZOFRAN -ODT) 4 MG disintegrating tablet, Dissolve 1 tablet (4 mg) in the mouth every 8 (eight) hours as needed for Nausea, Disp: 20 tablet, Rfl: 1    pantoprazole  (PROTONIX ) 40 MG tablet, Take 1 tablet (40 mg) by mouth once daily, Disp: 30 tablet, Rfl: 2    polyethylene glycol (MIRALAX ) 17 GM/SCOOP powder, Take 17 g by mouth daily, Disp: 90 each, Rfl: 3    senna-docusate (PERICOLACE) 8.6-50 MG per tablet, Take 2 tablets by mouth nightly (Patient not taking: Reported on 09/24/2023), Disp: 60 tablet, Rfl: 0    SUMAtriptan  (IMITREX ) 25 MG tablet, Take 1 tablet (25 mg) by mouth every 2 (two) hours as needed for Migraine, Disp: 9 tablet, Rfl: 3    traMADol  (ULTRAM ) 50 MG tablet, Take 1 tablet (50 mg) by mouth every 6 (six) hours as needed for Pain, Disp: 21 tablet, Rfl:  0    umeclidinium bromide  (INCRUSE ELLIPTA ) 62.5 MCG/ACT Aerosol Pwdr, Breath Activated, Inhale 1 puff into the lungs daily (Patient not taking: Reported on 09/24/2023), Disp: 1 each, Rfl: 0  [2]   Allergies  Allergen Reactions    Benadryl  [Diphenhydramine ]     Dust Mite Extract     Methocarbamol Itching    Molds & Smuts     Other     Compazine [Prochlorperazine] Anxiety   [3]   Past Surgical History:  Procedure Laterality Date    CYST REMOVAL      ESOPHAGOGASTRODUODENOSCOPY (EGD), ESOPHAGEAL ULTRASOUND (EUS) LIMITED TO THE ESOPHAGUS, STOMACH OR DUODENUM N/A 04/10/2023    Procedure: ESOPHAGOGASTRODUODENOSCOPY (EGD), ESOPHAGEAL ULTRASOUND (EUS) LIMITED TO THE ESOPHAGUS, STOMACH OR DUODENUM;  Surgeon: Abagail Carole HERO, MD;  Location: [facility];  Service: Gastroenterology;  Laterality: N/A;    ESOPHAGOGASTRODUODENOSCOPY (  EGD), ESOPHAGEAL ULTRASOUND (EUS) WITH FINE NEEDLE ASPIRATION/BIOPSY OF ESOPHAGUS, STOMACH, OR DUODENUM N/A 03/25/2023    Procedure: ESOPHAGOGASTRODUODENOSCOPY (EGD), ESOPHAGEAL ULTRASOUND (EUS) WITH FINE NEEDLE ASPIRATION/BIOPSY OF ESOPHAGUS, STOMACH, OR DUODENUM;  Surgeon: Abagail Carole HERO, MD;  Location: [facility];  Service: Gastroenterology;  Laterality: N/A;    ESOPHAGOGASTRODUODENOSCOPY (EGD), ESOPHAGEAL ULTRASOUND (EUS) WITH FINE NEEDLE ASPIRATION/BIOPSY OF ESOPHAGUS, STOMACH, OR DUODENUM N/A 04/03/2023    Procedure: ESOPHAGOGASTRODUODENOSCOPY (EGD), ESOPHAGEAL ULTRASOUND (EUS) WITH FINE NEEDLE ASPIRATION/BIOPSY OF ESOPHAGUS, STOMACH, OR DUODENUM;  Surgeon: Ulysess Foots, MD;  Location: [facility];  Service: Gastroenterology;  Laterality: N/A;    GALLBLADDER SURGERY      HERNIA REPAIR      HYSTERECTOMY      TUBAL LIGATION

## 2023-10-03 ENCOUNTER — Encounter (INDEPENDENT_AMBULATORY_CARE_PROVIDER_SITE_OTHER): Payer: Self-pay

## 2023-10-05 ENCOUNTER — Telehealth (INDEPENDENT_AMBULATORY_CARE_PROVIDER_SITE_OTHER): Payer: Self-pay

## 2023-10-05 ENCOUNTER — Ambulatory Visit (HOSPITAL_BASED_OUTPATIENT_CLINIC_OR_DEPARTMENT_OTHER): Admitting: Orthopaedic Surgery

## 2023-10-05 NOTE — Telephone Encounter (Signed)
 Attempted to reach out to Patient to confirm upcoming Procedure. Patient did not answered. Unable to leave voicemail since hasn't been set up

## 2023-10-06 ENCOUNTER — Telehealth (INDEPENDENT_AMBULATORY_CARE_PROVIDER_SITE_OTHER): Payer: Self-pay

## 2023-10-06 ENCOUNTER — Ambulatory Visit (INDEPENDENT_AMBULATORY_CARE_PROVIDER_SITE_OTHER): Payer: Self-pay | Admitting: Family

## 2023-10-06 ENCOUNTER — Encounter (INDEPENDENT_AMBULATORY_CARE_PROVIDER_SITE_OTHER)

## 2023-10-06 NOTE — Telephone Encounter (Addendum)
 Pt states she has throat infection and fever and requested to reschredule the procedure to a different day.     Pt has been rescheduled to 09/23 at [place] with Dr Matro.

## 2023-10-06 NOTE — PSS Phone Screening (Signed)
 Rn called pt for PSS for colonoscopy scheduled 9/10. Pt states not coming for colonoscopy due to fever and sore throat.  RN called Dr. Durel office to notify.

## 2023-10-06 NOTE — Telephone Encounter (Signed)
 From pre surgical nurse called and states patient is not feeling good she is with flu symptoms and reschedule the app for tom. With Dr. Jolena call back to reschedule the procedure for Tom.

## 2023-10-07 ENCOUNTER — Ambulatory Visit: Admission: RE | Admit: 2023-10-07 | Source: Ambulatory Visit | Admitting: Gastroenterology

## 2023-10-07 ENCOUNTER — Encounter: Admission: RE | Payer: Self-pay | Source: Ambulatory Visit

## 2023-10-07 SURGERY — COLONOSCOPY, SCREENING
Anesthesia: Anesthesia General | Site: Anus

## 2023-10-12 ENCOUNTER — Encounter (INDEPENDENT_AMBULATORY_CARE_PROVIDER_SITE_OTHER): Payer: Self-pay

## 2023-10-12 ENCOUNTER — Telehealth (INDEPENDENT_AMBULATORY_CARE_PROVIDER_SITE_OTHER): Payer: Self-pay

## 2023-10-12 ENCOUNTER — Ambulatory Visit: Admission: RE | Admit: 2023-10-12 | Source: Ambulatory Visit | Attending: Hematology & Oncology

## 2023-10-12 ENCOUNTER — Ambulatory Visit

## 2023-10-12 NOTE — Telephone Encounter (Signed)
 Spoke to patient to confirm Procedure scheduled for September 23rd with Dr Matro. Patient aware that she needs to arrive 90 min prior scheduled time. Patient confirmed having prep and prep instructions. Patient also confirm that she has a ride arranged. Patient verbalized understanding and not having further questions.

## 2023-10-13 ENCOUNTER — Ambulatory Visit (INDEPENDENT_AMBULATORY_CARE_PROVIDER_SITE_OTHER): Admitting: Orthopaedic Surgery

## 2023-10-16 ENCOUNTER — Ambulatory Visit (INDEPENDENT_AMBULATORY_CARE_PROVIDER_SITE_OTHER)

## 2023-10-16 ENCOUNTER — Encounter (INDEPENDENT_AMBULATORY_CARE_PROVIDER_SITE_OTHER): Payer: Self-pay

## 2023-10-16 NOTE — PSS Phone Screening (Signed)
 Pre-Anesthesia Evaluation    Pre-op phone visit requested by:   Reason for pre-op phone visit: Patient anticipating COLONOSCOPY, SCREENING procedure.         No orders of the defined types were placed in this encounter.      History of Present Illness/Summary:        Problem List:  Medical Problems       Hospital Problem List  Date Reviewed: 09/24/2023   None        Non-Hospital Problem List  Date Reviewed: 09/24/2023          ICD-10-CM Priority Class Noted Diagnosed    GERD (gastroesophageal reflux disease) (Chronic) K21.9   02/19/2016     Lumbar spondylosis (Chronic) M47.816   04/22/2017     OSA (obstructive sleep apnea) (Chronic) G47.33   10/04/2016     Vitamin D  deficiency (Chronic) E55.9   11/08/2020     History of CVA (cerebrovascular accident) (Chronic) Z86.73   11/14/2020     Asthma (Chronic) J45.909   12/10/2022     Bronchiectasis without complication (CMS/HCC) (Chronic) J47.9   12/10/2022     Personal history of pneumonia (recurrent) (Chronic) Z87.01   12/10/2022     Chronic cough (Chronic) R05.3   12/10/2022     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 Always use your most recent med list.                acetaminophen  650 MG CR tablet  TAKE 1 TABLET BY MOUTH EVERY 8 HOURS AS NEEDED FOR PAIN  Commonly known as: TYLENOL   Medication Adjustments for Surgery: Take as prescribed     albuterol  sulfate HFA 108 (90 Base) MCG/ACT inhaler  Inhale 1-2 puffs into the lungs  every 4 (four) hours as needed  Commonly known as: PROVENTIL   Medication Adjustments for Surgery: Take morning of surgery     benzonatate  200 MG capsule  Take 1 capsule (200 mg) by mouth 3 (three) times daily as needed for Cough  Commonly known as: TESSALON   Medication Adjustments for Surgery: Take as prescribed     Budeson-Glycopyrrol-Formoterol  160-9-4.8 MCG/ACT Aero  Inhale 2 puffs into the lungs 2 (two) times daily  Medication Adjustments for Surgery: Take morning of surgery     budesonide -formoterol  160-4.5 MCG/ACT inhaler  Inhale 2 puffs into the lungs 2 (two) times daily  Commonly known as: SYMBICORT   Medication Adjustments for Surgery: Take morning of surgery     * celecoxib  100 MG capsule  Take 2 capsules (200 mg) by mouth once daily  Commonly known as: CeleBREX   Medication Adjustments for Surgery: Take as prescribed     * celecoxib  100 MG capsule  Take 2 capsules (200 mg) by mouth once daily  Commonly known as: CeleBREX   Medication Adjustments for Surgery: Take as prescribed     cetirizine  10 MG tablet  Take 1 tablet (10 mg) by mouth daily  Commonly known as: ZyrTEC   Medication Adjustments for Surgery: Take as prescribed     ergocalciferol  1.25 MG (50000 UT) capsule  Take 1 capsule (50,000 Units) by mouth once a week  Commonly known as: ERGOCALCIFEROL   Medication Adjustments for Surgery: Hold day of surgery     escitalopram  10 MG tablet  Take 1 tablet (10 mg) by mouth once daily  Commonly known as: LEXAPRO   Medication Adjustments for Surgery: Take as prescribed     Excedrin PM 500-38 MG Tabs  Take by mouth daily  Generic drug: diphenhydrAMINE -APAP (sleep)  Medication Adjustments for Surgery: Take as prescribed     gabapentin  300 MG capsule  Take 1 capsule (300 mg) by mouth once nightly  Commonly known as: NEURONTIN   Medication Adjustments for Surgery: Take as prescribed     ibuprofen  600 MG tablet  Take 1 tablet (600 mg) by mouth every 6 (six) hours as needed for Pain  Commonly known as: ADVIL   Medication  Adjustments for Surgery: Last dose 24 hours before surgery     * lidocaine  5 %  Place 1 patch onto the skin in the morning. Remove & Discard patch within 12 hours or as directed by MD.  Commonly known as: LIDODERM   Medication Adjustments for Surgery: Hold day of surgery     * lidocaine  5 %  Place 1 patch onto the skin in the morning. Remove & Discard patch within 12 hours or as directed by MD.  Commonly known as: LIDODERM   Medication Adjustments for Surgery: Hold day of surgery     * lidocaine  5 %  Place 1 patch onto the skin in the morning. Remove & Discard patch within 12 hours or as directed by MD.  Commonly known as: LIDODERM   Medication Adjustments for Surgery: Hold day of surgery     linaCLOtide  145 MCG capsule  Take 1 capsule (145 mcg) by mouth once daily  Commonly known as: LINZESS   Medication Adjustments for Surgery: Take as prescribed     meloxicam 15 MG tablet  Take 1 tablet (15 mg) by mouth once daily  Commonly known as: MOBIC  Medication Adjustments for Surgery: Stop 5 days before surgery     montelukast  10 MG tablet  Take 1 tablet (10 mg) by mouth nightly  Commonly known as: SINGULAIR   Medication Adjustments for Surgery: Take morning of surgery     ondansetron  4 MG disintegrating tablet  Dissolve 1 tablet (4 mg) in the mouth every 8 (eight) hours as needed for Nausea  Commonly known as: ZOFRAN -ODT  Medication Adjustments for Surgery: Take as prescribed     pantoprazole  40 MG tablet  Take 1 tablet (40 mg) by mouth once daily  Commonly known as: PROTONIX   Medication Adjustments for Surgery: Take as prescribed     polyethylene glycol 17 GM/SCOOP powder  Take 17 g by mouth daily  Commonly known as: MIRALAX      senna-docusate 8.6-50 MG per tablet  Take 2 tablets by mouth nightly  Commonly known as: PERICOLACE  Medication Adjustments for Surgery: Take as prescribed     SUMAtriptan  25 MG tablet  Take 1 tablet (25 mg) by mouth every 2 (two) hours as needed for Migraine  Commonly known as: IMITREX   Medication  Adjustments for Surgery: Take  as prescribed     traMADol  50 MG tablet  Take 1 tablet (50 mg) by mouth as needed for Pain  Commonly known as: ULTRAM   Medication Adjustments for Surgery: Take as prescribed     umeclidinium bromide  62.5 MCG/ACT Aepb  Inhale 1 puff into the lungs daily  Commonly known as: INCRUSE ELLIPTA   Medication Adjustments for Surgery: Take morning of surgery           * This list has 5 medication(s) that are the same as other medications prescribed for you. Read the directions carefully, and ask your doctor or other care provider to review them with you.                Allergies[2]  Family History[3]  Social History     Occupational History    Not on file   Tobacco Use    Smoking status: Never     Passive exposure: Current    Smokeless tobacco: Never   Vaping Use    Vaping status: Never Used   Substance and Sexual Activity    Alcohol  use: Not Currently    Drug use: Never    Sexual activity: Not Currently       Menstrual History:   LMP / Status  Hysterectomy     Patient's last menstrual period was 03/18/2012.    Tubal Ligation?  No valid surgical or medical questions entered.           Exam Scores:   SDB score  OSA Risk Category: At Risk        STBUR score       PONV score  Nausea Risk: SEVERE RISK    MST score  MST Score: 0    PEN-FAST score       Frailty score       CHADsVasc            Visit Vitals  Ht 1.499 m (4' 11)   Wt 73.9 kg (163 lb)   LMP 03/18/2012   BMI 32.92 kg/m   Obese based on BMI.                Recent Labs   Other (last 180 days) 05/08/23  1545   TSH 0.891                      [1]   Past Surgical History:  Procedure Laterality Date    CYST REMOVAL      ESOPHAGOGASTRODUODENOSCOPY (EGD), ESOPHAGEAL ULTRASOUND (EUS) LIMITED TO THE ESOPHAGUS, STOMACH OR DUODENUM N/A 04/10/2023    Procedure: ESOPHAGOGASTRODUODENOSCOPY (EGD), ESOPHAGEAL ULTRASOUND (EUS) LIMITED TO THE ESOPHAGUS, STOMACH OR DUODENUM;  Surgeon: Abagail Carole HERO, MD;  Location: [facility];  Service: Gastroenterology;   Laterality: N/A;    ESOPHAGOGASTRODUODENOSCOPY (EGD), ESOPHAGEAL ULTRASOUND (EUS) WITH FINE NEEDLE ASPIRATION/BIOPSY OF ESOPHAGUS, STOMACH, OR DUODENUM N/A 03/25/2023    Procedure: ESOPHAGOGASTRODUODENOSCOPY (EGD), ESOPHAGEAL ULTRASOUND (EUS) WITH FINE NEEDLE ASPIRATION/BIOPSY OF ESOPHAGUS, STOMACH, OR DUODENUM;  Surgeon: Abagail Carole HERO, MD;  Location: [facility];  Service: Gastroenterology;  Laterality: N/A;    ESOPHAGOGASTRODUODENOSCOPY (EGD), ESOPHAGEAL ULTRASOUND (EUS) WITH FINE NEEDLE ASPIRATION/BIOPSY OF ESOPHAGUS, STOMACH, OR DUODENUM N/A 04/03/2023    Procedure: ESOPHAGOGASTRODUODENOSCOPY (EGD), ESOPHAGEAL ULTRASOUND (EUS) WITH FINE NEEDLE ASPIRATION/BIOPSY OF ESOPHAGUS, STOMACH, OR DUODENUM;  Surgeon: Ulysess Foots, MD;  Location: [facility];  Service: Gastroenterology;  Laterality: N/A;    GALLBLADDER SURGERY      HERNIA REPAIR      HYSTERECTOMY  TUBAL LIGATION     [2]   Allergies  Allergen Reactions    Benadryl  [Diphenhydramine ]     Dust Mite Extract     Methocarbamol Itching    Molds & Smuts     Other     Compazine [Prochlorperazine] Anxiety   [3]   Family History  Problem Relation Name Age of Onset    Heart attack Mother      Diabetes Father      Heart attack Maternal Grandfather      Heart attack Paternal Grandfather      Stroke Son      Heart attack Maternal Uncle      Stomach cancer Cousin      Bone cancer Maternal Aunt      Colon cancer Neg Hx      Breast cancer Neg Hx

## 2023-10-18 ENCOUNTER — Other Ambulatory Visit (INDEPENDENT_AMBULATORY_CARE_PROVIDER_SITE_OTHER): Payer: Self-pay | Admitting: Family Medicine

## 2023-10-18 ENCOUNTER — Other Ambulatory Visit (INDEPENDENT_AMBULATORY_CARE_PROVIDER_SITE_OTHER): Payer: Self-pay | Admitting: Internal Medicine

## 2023-10-18 DIAGNOSIS — G43809 Other migraine, not intractable, without status migrainosus: Secondary | ICD-10-CM

## 2023-10-18 DIAGNOSIS — M549 Dorsalgia, unspecified: Secondary | ICD-10-CM

## 2023-10-19 ENCOUNTER — Telehealth: Payer: Self-pay

## 2023-10-19 ENCOUNTER — Encounter (INDEPENDENT_AMBULATORY_CARE_PROVIDER_SITE_OTHER): Payer: Self-pay | Admitting: Anesthesiology

## 2023-10-19 ENCOUNTER — Ambulatory Visit: Admitting: Hematology & Oncology

## 2023-10-19 NOTE — Anesthesia Preprocedure Evaluation (Signed)
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

## 2023-10-19 NOTE — Telephone Encounter (Signed)
 Pt missed her appt with Dr. Junita today, please call pt to reschedule appt to next available      Thank you!!

## 2023-10-20 ENCOUNTER — Encounter: Admission: RE | Payer: Self-pay | Source: Ambulatory Visit

## 2023-10-20 SURGERY — COLONOSCOPY, SCREENING
Anesthesia: Anesthesia General | Site: Anus

## 2023-10-22 ENCOUNTER — Ambulatory Visit

## 2023-10-23 ENCOUNTER — Ambulatory Visit (INDEPENDENT_AMBULATORY_CARE_PROVIDER_SITE_OTHER): Admitting: Internal Medicine

## 2023-10-26 ENCOUNTER — Ambulatory Visit (INDEPENDENT_AMBULATORY_CARE_PROVIDER_SITE_OTHER): Admitting: Internal Medicine

## 2023-10-28 ENCOUNTER — Telehealth: Payer: Self-pay

## 2023-10-29 ENCOUNTER — Other Ambulatory Visit (INDEPENDENT_AMBULATORY_CARE_PROVIDER_SITE_OTHER): Payer: Self-pay | Admitting: Internal Medicine

## 2023-10-29 NOTE — Telephone Encounter (Signed)
 Dialed patient's phone number 3x, no answer and mailbox is not set up. Called the daughter's phone number and LVM for patient to reschedule procedure. Provided call back number and call center number.

## 2023-11-05 ENCOUNTER — Ambulatory Visit: Admission: RE | Admit: 2023-11-05 | Source: Ambulatory Visit

## 2023-11-17 ENCOUNTER — Ambulatory Visit (INDEPENDENT_AMBULATORY_CARE_PROVIDER_SITE_OTHER): Admitting: Physician Assistant

## 2023-11-17 NOTE — Progress Notes (Deleted)
 [name], [place]  [facility]      Date Time: November 17, 2023 9:23 AM  Patient Name: Valerie May, Valerie May  Requesting Physician: Zik, [place] J, MD     Verbal consent obtained to record this visit    Reason for Consultation/Chief Complaint:     No chief complaint on file.    Assessment and Plan:   Valerie May is a 53 y.o. female w/ PMHx NET of pancreas, CVA, OSA, asthma, bronchiectasis, GERD, preDM, polycythemia, who presents to [facility] clinic for constipation, n/v, early satiety  Assessment & Plan  Nausea and vomiting, Early satiety  Nausea and vomiting possibly 2/2 suspected gastroparesis and exacerbated by GERD. Metoclopramide  prescribed after recent hospitalization and provided some relief.  Will hold off on prescribing now given patient needs gastric emptying study and this will affect the results. GES ordered last visit, not done.  - Encouraged to schedule 4-hour gastric emptying study  - ***Zofran  (ondansetron ) sublingual tablet for nausea.  - *** pantoprazole  40 mg daily on an empty stomach    Constipation-predominant IBS  Chronic constipation with bowel movements every 3-4 days, accompanied by abdominal pain which is relieved by bowel movements. Previous use of Miralax  provided no relief.  - ***Prescribe Linzess  (linaclotide ) 145 mcg once daily to manage constipation.  - Titrate dose up or down pending response    CRC Screening, Family hx colon cancer  None prior. Mother died of colon cancer. Colonoscopy ordered last visit, rescheduled then subsequently cancelled  - Schedule Colonoscopy at [facility] w/ Suflave     Gastroesophageal reflux disease (GERD)  Recurrent GERD symptoms worsened since discontinuation of pantoprazole . Previously managed with pantoprazole .  - Prescribe pantoprazole  40 mg once daily in the morning on an empty stomach.    Obesity  Obesity contributing to overall health issues, including potential sleep apnea. She expressed desire to lose weight.  - Refer  to Bariatric clinic for assistance with weight loss.    No orders of the defined types were placed in this encounter.      FOLLOW UP:  No follow-ups on file.     History:     History of Present Illness  Valerie May is a 53 year old female with a pancreatic tumor who presents with nausea and vomiting. She was referred by Dr. Junita for evaluation of nausea and vomiting.    Nausea and vomiting  - Persistent nausea and vomiting  - Feeling of being full  - RUQ discomfort  - Symptoms improved with metoclopramide  prescribed after recent hospitalization  - Previously on pantoprazole  but has recently been off due to running out    Gastroesophageal reflux and heartburn  - Heartburn and reflux present  - Occasionally takes Tums and Maalox without relief  - Previously on pantoprazole , but not currently taking it    Constipation and bowel habits  - Constipation with bowel movements every three to four days  - Occasionally requires manual evacuation  - Minimal relief with Miralax   - Abdominal pain improves after bowel movement    Pancreatic tumor  - Known pancreatic NET     Family Hx GI related cancers - mother colon cancer, uncle colon cancer    The patient denies fevers, weight loss, dysphagia, odynophagia, diarrhea, hematemesis, melena, or hematochezia.     Anticoagulants - none    Results      Lab Results   Component Value Date    WBC 8.35 04/13/2023    HGB 13.7 04/13/2023  HCT 40.4 04/13/2023    MCV 88.0 04/13/2023    PLT 357 (H) 04/13/2023    CREAT 0.6 04/13/2023    EGFR >60.0 04/13/2023    AST 37 04/12/2023    ALT 57 (H) 04/12/2023    ALKPHOS 108 04/12/2023    ALB 3.7 04/12/2023    BILITOTAL 0.4 04/12/2023    BILIDIRECT 0.2 04/08/2023    BILIINDIRECT 0.3 04/08/2023    LIP 28 04/11/2023    TSH 0.891 05/08/2023    T4FREE 0.82 03/22/2023    TTGIGA <1.0 03/30/2023    ESR 35 (H) 04/09/2023    CRP 0.3 04/08/2023     Lab Results   Component Value Date    B12 554 03/23/2023    STLOB Negative 04/09/2023     ENDOSCOPIES:  EGD  EUS 04/10/23:  EGD - Normal stomach. - Normal examined duodenum. - No specimens collected.  EUS - A mass was identified in the pancreatic head. The staging applies if malignancy is confirmed. Fine needle aspiration performed. - There was no sign of significant pathology in the common bile duct. - Evidence of a cholecystectomy.    Repeat EUS attempted on 3/7, however, large amount of food residue was found in stomach and duodenum thus unable to perform EUS with FNB.    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 Fine needle biopsy performed --> inadequate tissue from FNB, GI asked to repeat biopsy.       GI PATHOLOGY:  FNA 04/10/23:  A. Pancreas, head, mass, endoscopic ultrasound-guided fine-needle aspiration:             - Well differentiated neuroendocrine tumor, see comment.     03/25/23:  A. Esophagus, brushing:             - Candida esophagitis.    RADIOLOGY:  CT Abdomen Pelvis W IV/ WO PO Cont 08/08/2023  LOWER CHEST:  Lung bases are clear.  LIVER:  Unremarkable.  GALLBLADDER/BILE DUCTS:  Gallbladder is surgically absent.  PANCREAS:  Unremarkable.  SPLEEN:  Unremarkable.  ADRENAL GLANDS:  Unremarkable.  KIDNEYS / URETERS:  On 0.6 cm low-attenuation lesion at the right kidney  measures greater than water  attenuation and not fully characterized. Small  calculi in both kidneys. No hydronephrosis.  BOWEL / MESENTERY:  Diverticula throughout the colon..   No bowel obstruction.  APPENDIX:  Identified and normal.  No evidence of acute appendicitis.  PERITONEUM: No free air.    No free fluid.  VESSELS:  Abdominal aorta is normal caliber.  RETROPERITONEUM:  Unremarkable.  REPRODUCTIVE ORGANS:  Uterus not identified.  BLADDER: Unremarkable.  ABDOMINAL WALL:  Unremarkable.  BONES:  No acute abnormality.  OTHER: None.  ___________________________________    Impression  1. Colonic diverticulosis without evidence of acute diverticulitis.  2. Bilateral nephrolithiasis without hydronephrosis.  3. Incidental  right renal lesion 1.6 cm indeterminate. Follow-up imaging with  MRI recommended for further characterization.  CT chest reported separately.    CT A/P 04/09/23:  1.Minimal fat stranding/fluid adjacent to the pancreatic head may represent  postbiopsy changes. Recommend correlation with lipase to exclude acute  pancreatitis.  2.Slightly increased size of the pancreatic head lesion, potentially  reflecting intralesional hemorrhage post biopsy.    MRI ABD 03/23/23:  1.Pancreatic head 1.2 cm enhancing lesion, suspected neoplasm. This lesion  appears confined of the pancreas and demonstrates no major vascular  interfaces. Recommend EUS for further evaluation.  2.No findings suspicious for metastatic disease within the imaged abdomen.  3.Mid right renal 1.7 cm cyst, recently demonstrating intermediate density  by CT and is benign.  4.Hepatic steatosis.  5.Colonic diverticulosis.    CT A/P 03/21/23:  1.No acute abnormality within the chest, abdomen, or pelvis.  2.No findings of acute pulmonary embolism within the limitations stated  above.  3.Hyperdense pancreatic head lesion. Further evaluation with MRI/MRCP is  recommended.  4.Mildly hyperdense right renal lesion, possible hemorrhagic/proteinaceous  cyst. This can also be further evaluated on MRI.  5.Additional chronic findings as above.    Past Medical History:     Past Medical History:   Diagnosis Date    COVID-19 2021    Kidney stone     Nausea and vomiting, unspecified vomiting type     PreOp dx for DOS 10/20/23    OSA (obstructive sleep apnea)     Pneumonia     Uterine leiomyoma 10/11/2017       Past Surgical History:   Past Surgical History[1]    Family History:   Family History[2]    Social History:   Social History[3]     Allergies:   Allergies[4]    Medications:   Current Medications[5]    Review of Systems:     Negative except as stated in HPI    Physical Exam:     There were no vitals filed for this visit.       There is no height or weight on file to calculate  BMI.    General appearance - Well developed and well nourished. Normal gait.  HEENT- Normocephalic. Sclera anicteric. Normal hearing. Nares normal.  Oropharynx normal.  Neck - Supple.  No thyromegaly.  No cervical lymphadenopathy.  Chest - clear to percussion and auscultation.  No wheeze, rhonchi, or rales.  Heart - regular rate and rhythm without murmurs, gallops, or rubs.  Abdomen - normal bowel sounds.  No focal tenderness to palpation. Abdomen is soft. No rebound or guarding. No hepatosplenomegaly.  No masses. No ascites.  Rectal - deferred until time of colonoscopy.  Musculoskeletal - normal range of motion of arms and legs.  Extremities - no clubbing, cyanosis, or edema.  Skin - no rashes or lesions.  Neurologic - Alert and oriented to person, place and time.  No focal motor or sensory deficits.  No asterixis.  Recent and remote memory normal.       All of the above was reviewed and discussed with the patient.  Face-to-face time was spent in patient education counseling and coordination of care. Additionally, differential diagnoses, suggested diagnostic work-up and potential treatment options were discussed. Questions and concerns were addressed. Will proceed as noted and follow clinically.The patient verbalized understanding of the assessment and plan with verbalized understanding and all questions answered. Patient education provided. Potential medication side effects discussed with patient including risk and benefits. Further recommendations based on clinical progression and test results. Contact information provided and instructed to inform us  with changes.     I spent *** minutes of total time in patient care.  This includes the time spent reviewing pathology and imaging reports prior to the encounter as well as time spent discussing the care with other physicians and also face to face time with the patient. This time excludes any separately reportable procedures.    Signed by:  [name],  PA-C   Gastroenterology       Dr. Rexford ref. provider found, thank you for referring Valerie May to [facility]. It was a pleasure to see her and  to participate in her care. Please do not hesitate to reach out to me if you have any clinical concerns to discuss.       CC:               [1]   Past Surgical History:  Procedure Laterality Date    CYST REMOVAL      ESOPHAGOGASTRODUODENOSCOPY (EGD), ESOPHAGEAL ULTRASOUND (EUS) LIMITED TO THE ESOPHAGUS, STOMACH OR DUODENUM N/A 04/10/2023    Procedure: ESOPHAGOGASTRODUODENOSCOPY (EGD), ESOPHAGEAL ULTRASOUND (EUS) LIMITED TO THE ESOPHAGUS, STOMACH OR DUODENUM;  Surgeon: Abagail Carole HERO, MD;  Location: [facility];  Service: Gastroenterology;  Laterality: N/A;    ESOPHAGOGASTRODUODENOSCOPY (EGD), ESOPHAGEAL ULTRASOUND (EUS) WITH FINE NEEDLE ASPIRATION/BIOPSY OF ESOPHAGUS, STOMACH, OR DUODENUM N/A 03/25/2023    Procedure: ESOPHAGOGASTRODUODENOSCOPY (EGD), ESOPHAGEAL ULTRASOUND (EUS) WITH FINE NEEDLE ASPIRATION/BIOPSY OF ESOPHAGUS, STOMACH, OR DUODENUM;  Surgeon: Abagail Carole HERO, MD;  Location: [facility];  Service: Gastroenterology;  Laterality: N/A;    ESOPHAGOGASTRODUODENOSCOPY (EGD), ESOPHAGEAL ULTRASOUND (EUS) WITH FINE NEEDLE ASPIRATION/BIOPSY OF ESOPHAGUS, STOMACH, OR DUODENUM N/A 04/03/2023    Procedure: ESOPHAGOGASTRODUODENOSCOPY (EGD), ESOPHAGEAL ULTRASOUND (EUS) WITH FINE NEEDLE ASPIRATION/BIOPSY OF ESOPHAGUS, STOMACH, OR DUODENUM;  Surgeon: Ulysess Foots, MD;  Location: [facility];  Service: Gastroenterology;  Laterality: N/A;    GALLBLADDER SURGERY      HERNIA REPAIR      HYSTERECTOMY      TUBAL LIGATION     [2]   Family History  Problem Relation Name Age of Onset    Heart attack Mother      Diabetes Father      Heart attack Maternal Grandfather      Heart attack Paternal Grandfather      Stroke Son      Heart attack Maternal Uncle      Stomach cancer Cousin      Bone cancer Maternal Aunt      Colon cancer Neg Hx      Breast cancer Neg Hx      [3]   Social History  Tobacco Use    Smoking status: Never     Passive exposure: Current    Smokeless tobacco: Never   Vaping Use    Vaping status: Never Used   Substance Use Topics    Alcohol  use: Not Currently    Drug use: Never   [4]   Allergies  Allergen Reactions    Benadryl  [Diphenhydramine ]     Dust Mite Extract     Methocarbamol Itching    Molds & Smuts     Other     Compazine [Prochlorperazine] Anxiety   [5]   Current Outpatient Medications:     acetaminophen  (TYLENOL ) 650 MG CR tablet, TAKE 1 TABLET BY MOUTH EVERY 8 HOURS AS NEEDED FOR PAIN, Disp: 90 tablet, Rfl: 1    albuterol  sulfate HFA (PROVENTIL ) 108 (90 Base) MCG/ACT inhaler, Inhale 1-2 puffs into the lungs every 4 (four) hours as needed, Disp: , Rfl:     benzonatate  (TESSALON ) 200 MG capsule, Take 1 capsule (200 mg) by mouth 3 (three) times daily as needed for Cough, Disp: , Rfl:     Budeson-Glycopyrrol-Formoterol  160-9-4.8 MCG/ACT Aerosol, Inhale 2 puffs into the lungs 2 (two) times daily, Disp: 10.7 g, Rfl: 5    budesonide -formoterol  (SYMBICORT ) 160-4.5 MCG/ACT inhaler, Inhale 2 puffs into the lungs 2 (two) times daily, Disp: 1 each, Rfl: 0    celecoxib  (CeleBREX ) 100 MG capsule, Take 2 capsules (200 mg) by mouth once daily (Patient not taking: Reported  on 10/16/2023), Disp: 30 capsule, Rfl: 3    celecoxib  (CeleBREX ) 100 MG capsule, Take 2 capsules (200 mg) by mouth once daily (Patient not taking: Reported on 10/16/2023), Disp: 30 capsule, Rfl: 3    cetirizine  (ZyrTEC ) 10 MG tablet, Take 1 tablet (10 mg) by mouth daily (Patient not taking: Reported on 10/16/2023), Disp: , Rfl:     diphenhydrAMINE -APAP, sleep, (Excedrin PM) 38-500 MG Tab, Take by mouth daily, Disp: , Rfl:     ergocalciferol  (ERGOCALCIFEROL ) 1.25 MG (50000 UT) capsule, Take 1 capsule (50,000 Units) by mouth once a week, Disp: 12 capsule, Rfl: 1    escitalopram  (LEXAPRO ) 10 MG tablet, Take 1 tablet (10 mg) by mouth once daily (Patient not taking: Reported on 10/16/2023), Disp: 90  tablet, Rfl: 3    gabapentin  (NEURONTIN ) 300 MG capsule, Take 1 capsule (300 mg) by mouth once nightly (Patient not taking: Reported on 10/16/2023), Disp: 30 capsule, Rfl: 1    ibuprofen  (ADVIL ) 600 MG tablet, Take 1 tablet (600 mg) by mouth every 6 (six) hours as needed for Pain, Disp: 30 tablet, Rfl: 1    lidocaine  (LIDODERM ) 5 %, Place 1 patch onto the skin in the morning. Remove & Discard patch within 12 hours or as directed by MD. (Patient not taking: Reported on 10/16/2023), Disp: 30 patch, Rfl: 0    lidocaine  (LIDODERM ) 5 %, Place 1 patch onto the skin in the morning. Remove & Discard patch within 12 hours or as directed by MD. (Patient not taking: Reported on 10/16/2023), Disp: 12 patch, Rfl: 2    lidocaine  (LIDODERM ) 5 %, Place 1 patch onto the skin in the morning. Remove & Discard patch within 12 hours or as directed by MD. (Patient not taking: Reported on 10/16/2023), Disp: 12 patch, Rfl: 2    linaCLOtide  (LINZESS ) 145 MCG capsule, Take 1 capsule (145 mcg) by mouth once daily, Disp: 30 capsule, Rfl: 2    meloxicam (MOBIC) 15 MG tablet, Take 1 tablet (15 mg) by mouth once daily (Patient not taking: Reported on 09/24/2023), Disp: , Rfl:     montelukast  (SINGULAIR ) 10 MG tablet, Take 1 tablet (10 mg) by mouth nightly, Disp: 90 tablet, Rfl: 3    ondansetron  (ZOFRAN -ODT) 4 MG disintegrating tablet, Dissolve 1 tablet (4 mg) in the mouth every 8 (eight) hours as needed for Nausea (Patient not taking: Reported on 10/16/2023), Disp: 20 tablet, Rfl: 1    pantoprazole  (PROTONIX ) 40 MG tablet, Take 1 tablet (40 mg) by mouth once daily, Disp: 30 tablet, Rfl: 2    polyethylene glycol (MIRALAX ) 17 GM/SCOOP powder, Take 17 g by mouth daily, Disp: 90 each, Rfl: 3    senna-docusate (PERICOLACE) 8.6-50 MG per tablet, Take 2 tablets by mouth nightly (Patient not taking: Reported on 10/16/2023), Disp: 60 tablet, Rfl: 0    SUMAtriptan  (IMITREX ) 25 MG tablet, TAKE 1 TABLET BY MOUTH EVERY 2 HOURS AS NEEDED FOR MIGRAINE HEADACHE, Disp:  10 tablet, Rfl: 2    traMADol  (ULTRAM ) 50 MG tablet, Take 1 tablet (50 mg) by mouth as needed for Pain, Disp: , Rfl:     umeclidinium bromide  (INCRUSE ELLIPTA ) 62.5 MCG/ACT Aerosol Pwdr, Breath Activated, Inhale 1 puff into the lungs daily, Disp: 1 each, Rfl: 0

## 2023-11-18 ENCOUNTER — Ambulatory Visit (INDEPENDENT_AMBULATORY_CARE_PROVIDER_SITE_OTHER): Admitting: Physician Assistant

## 2023-11-20 NOTE — Telephone Encounter (Signed)
 Left VM for patient to reschedule procedure. Provided call back number and call center number.

## 2023-11-23 ENCOUNTER — Telehealth (INDEPENDENT_AMBULATORY_CARE_PROVIDER_SITE_OTHER): Payer: Self-pay | Admitting: Physician Assistant

## 2023-11-23 ENCOUNTER — Ambulatory Visit: Attending: Hematology & Oncology | Admitting: Hematology & Oncology

## 2023-11-23 ENCOUNTER — Encounter: Payer: Self-pay | Admitting: Hematology & Oncology

## 2023-11-23 ENCOUNTER — Telehealth: Payer: Self-pay

## 2023-11-23 VITALS — BP 122/81 | HR 125 | Temp 97.9°F | Resp 17 | Ht 59.0 in | Wt 168.6 lb

## 2023-11-23 DIAGNOSIS — D3A8 Other benign neuroendocrine tumors: Secondary | ICD-10-CM | POA: Insufficient documentation

## 2023-11-23 NOTE — Telephone Encounter (Signed)
 Please call pt to schedule:  -MRI abdomen ASAP at [facility]  -Appt with Dr. Junita after MRI, notify this RN if slot is needed      Thank you!

## 2023-11-23 NOTE — Telephone Encounter (Signed)
 Patient wants to talk to the clinical team for her medication refill.

## 2023-11-23 NOTE — Progress Notes (Signed)
 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 Surgical resection was not performed because the tumor was small, and the patient was told she would be checked every six months. Most recent abdominal scan in July did not comment on the pancreas; MRI is planned to assess for interval change. She expresses concern regarding tumor progression and asks whether surgery would be needed in the future.    Intermittent epigastric abdominal pain described as cramping, sometimes associated with localized hardness and tenderness, and occasionally a sensation of a 'stick' or 'little ball' in the area. Pain does not radiate to the back. Episodes of fatigue, particularly postprandial or after defecation.    Chronic constipation-predominant irritable bowel syndrome and intermittent nausea managed by gastroenterology. Linzess  daily for constipation with some benefit; prior trial of Miralax  without relief. Uses Zofran  as needed and pantoprazole  for nausea, nearly out of pantoprazole . Gastric emptying study pending due to scheduling conflicts. Colonoscopy also pending.    ECOG Performance Status: 1 (Restricted in physically strenuous activity but ambulatory and able to carry out work of a light or sedentary nature, e.g., light house work, office work)      Objective     Physical Exam  GENERAL: Well developed, well nourished, no apparent distress.  VITAL SIGNS: Vital signs reviewed, Pain rating 0 out of 10.  HEAD EYES EARS NOSE THROAT: No alopecia, PERRL, conjunctivae anicteric.  NECK: Supple.  CHEST: Normal excursion.  ABDOMEN: Non distended, mild tenderness.  NEUROLOGICAL: Alert and oriented times 3, no apparent focal deficits, gait non  antalgic.  PSYCHIATRIC: Normal thought content, expressed judgment.  SKIN: Clear with no rash.       No results found.        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 No evidence of tumor-related abdominal pain. Tumor has potential for growth or metastasis, but typically progresses slowly over years. Surgical intervention would be reconsidered if interval growth occurs.  - Ordered abdominal MRI to assess for interval change.  - Advised completion of MRI as soon as feasible, considering insurance and deductible timing.  - Planned surveillance imaging every six months unless interval growth is detected.  - Discussed reconsideration of surgical evaluation if tumor demonstrates growth.    Constipation-predominant irritable bowel syndrome  Chronic constipation-predominant irritable bowel syndrome with persistent symptoms despite prior Miralax  and current daily Linzess . Under active management by gastroenterology.  - Advised continuation of Linzess  once daily as prescribed.  - Encouraged ongoing follow-up with gastroenterology.  - Colonoscopy scheduled per gastroenterology recommendation.    Epigastric abdominal pain, under evaluation  Intermittent epigastric pain with localized tenderness, not attributed to pancreatic neuroendocrine tumor. Ongoing evaluation by gastroenterology, including scheduled colonoscopy and pending gastric emptying study. Abdominal MRI will further assess for tumor-related causes.  - Encouraged completion of gastric emptying study as scheduled.  - Advised continued follow-up with gastroenterology for further evaluation.  - Abdominal MRI will help rule out tumor-related causes.    Intermittent nausea  Intermittent nausea likely secondary to underlying gastrointestinal pathology. Managed with as-needed Zofran  and pantoprazole , both prescribed by gastroenterology. Nearly out of  pantoprazole .  - Advised continuation of Zofran  as needed for nausea.  - Advised contacting gastroenterology for pantoprazole  refill and continuation as previously prescribed.     Verbal consent obtained to record this visit.    Quintin Moat, MD  Gastrointestinal Oncology  [facility]

## 2023-11-27 NOTE — Telephone Encounter (Signed)
 Called and spoke with the patient. Patient states she would like a refill on Protonix  and Linzess . Writer informed the patient that Levon gave additional 2 refills on both of these meds. Informed patient that she should call her pharmacy to pick up the refills. Patient verbalized the understanding.

## 2023-11-28 ENCOUNTER — Other Ambulatory Visit: Payer: Self-pay | Admitting: Hematology & Oncology

## 2023-11-28 ENCOUNTER — Ambulatory Visit: Admission: RE | Admit: 2023-11-28 | Discharge: 2023-11-28 | Attending: Hematology & Oncology

## 2023-11-28 DIAGNOSIS — D3A8 Other benign neuroendocrine tumors: Secondary | ICD-10-CM | POA: Insufficient documentation

## 2023-11-28 MED ORDER — GADOBUTROL 1 MMOL/ML IV SOSY (WRAP)
10.0000 mL | Freq: Once | INTRAVENOUS | Status: AC | PRN
Start: 2023-11-28 — End: 2023-11-28
  Administered 2023-11-28: 10 mL via INTRAVENOUS
  Filled 2023-11-28: qty 10

## 2023-12-07 ENCOUNTER — Ambulatory Visit (INDEPENDENT_AMBULATORY_CARE_PROVIDER_SITE_OTHER): Admitting: Internal Medicine

## 2023-12-15 ENCOUNTER — Encounter (INDEPENDENT_AMBULATORY_CARE_PROVIDER_SITE_OTHER): Payer: Self-pay

## 2023-12-15 NOTE — Telephone Encounter (Signed)
 Spoke with patient, via interpreter, to let her know she is scheduled for 12/8 with Dr. Matro for her procedure. Sent mychart message with all the details again. Also gave number for radiology for gastric imaging order.

## 2023-12-21 ENCOUNTER — Other Ambulatory Visit (FREE_STANDING_LABORATORY_FACILITY)

## 2023-12-21 ENCOUNTER — Encounter: Payer: Self-pay | Admitting: Hematology & Oncology

## 2023-12-21 ENCOUNTER — Telehealth: Payer: Self-pay

## 2023-12-21 ENCOUNTER — Ambulatory Visit: Attending: Anatomic and Clinical Pathology | Admitting: Hematology & Oncology

## 2023-12-21 VITALS — BP 115/73 | HR 105 | Temp 98.2°F | Resp 17 | Ht 59.0 in | Wt 170.9 lb

## 2023-12-21 DIAGNOSIS — D3A8 Other benign neuroendocrine tumors: Secondary | ICD-10-CM

## 2023-12-21 NOTE — Telephone Encounter (Signed)
 Hello! This pt is known to [facility] and needs a f/u appt scheduled next available. She has persistent GI symptoms (mainly abd pain) and needs to see a GI provider to f/u.     Thank you!

## 2023-12-21 NOTE — Progress Notes (Signed)
 GASTROINTESTINAL ONCOLOGY         Subjective     History of Present Illness  Valerie May is a 53 year old female with a stable pancreatic neuroendocrine tumor who presents for oncology follow-up and evaluation of persistent gastrointestinal symptoms.    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 Recent abdominal MRI on November 1 demonstrated a stable lesion with no change compared to prior imaging from February 25.    She continues to experience daily gastrointestinal symptoms, including persistent nausea, upper abdominal pain, and marked abdominal distension. She describes visible abdominal swelling and requires ondansetron  to prevent vomiting. Constipation is ongoing, necessitating daily use of Linzess , and she denies diarrhea. Intermittent acid reflux occurs depending on food intake. Pain is localized to the upper abdomen, worsened by pressure and exacerbated by wearing a bra. She occasionally experiences a sensation of obstruction when turning.    She underwent upper endoscopy with biopsy in March, which was normal with no evidence of inflammation. She is scheduled for colonoscopy on December 8 and is followed by gastroenterology for further evaluation. Current medications include ondansetron , pantoprazole , and Linzess , all prescribed by gastroenterology.      Objective     Physical Exam  GENERAL: Well developed, well nourished, no apparent distress.  VITAL SIGNS: Vital signs reviewed, Pain rating 0 out of 10.  HEAD EYES EARS NOSE THROAT: No alopecia, PERRL, conjunctivae anicteric.  NECK: Supple.  CHEST: Normal excursion.  ABDOMEN: Non distended, tenderness upon palpation of RUQ no masses  NEUROLOGICAL: Alert and oriented times 3, no apparent focal deficits, gait non antalgic.  PSYCHIATRIC: Normal thought content, expressed judgment.  SKIN: Clear with no rash.       MRI Abdomen W WO Contrast  Result  Date: 12/02/2023  1. Stable enhancing 1.2 cm pancreatic head lesion, highly suspicious for underlying neuroendocrine tumor. 2. No evidence of metastatic disease in the abdomen. 3. Hepatic steatosis. Electronically signed by: Kevin C. Mccammack M.D. [facility]: 12/02/23          Assessment/Plan     Assessment & Plan  Pancreatic neuroendocrine tumor, head of pancreas, stable  She has a 1.2 cm heterogeneously enhancing lesion in the head of the pancreas, unchanged from prior imaging since diagnosis earlier this year. Tumors of this size are typically managed with observation. The malignant potential was discussed, but lesions <2 cm often remain indolent. No evidence of local progression or metastatic disease on recent MRI. The relationship of the tumor to her gastrointestinal symptoms remains uncertain.  - Ordered serum gastrin level   - Referred to surgical oncology for consultation regarding possible resection and appropriateness of surgery.  - Scheduled follow-up in six months.  - Repeat imaging in 12 months    Chronic nausea  She experiences persistent daily nausea requiring ondansetron . Etiology remains unclear; hormone-mediated symptoms are under consideration, and the relationship to the pancreatic neuroendocrine tumor is uncertain.  - Ordered serum gastrin level to assess for hormone-mediated etiology.  - Referred to gastroenterology for further evaluation.    Abdominal pain and distension  She reports persistent, localized abdominal pain and significant abdominal distension, exacerbated by pressure. Prior endoscopy in March was normal. The relationship to the pancreatic lesion is uncertain.  - Referred to gastroenterology for expedited evaluation.  - Scheduled surgical oncology consultation to discuss further management and possible need for additional endoscopic evaluation.    Chronic constipation  She has ongoing constipation requiring daily Linzess . Management is overseen by  gastroenterology.  - Referred to gastroenterology for further evaluation and management.     Verbal consent obtained to record this visit.    Quintin Moat, MD  Gastrointestinal Oncology  [facility]

## 2023-12-23 LAB — GASTRIN: Gastrin: 44 pg/mL (ref <=100)

## 2023-12-30 ENCOUNTER — Telehealth (INDEPENDENT_AMBULATORY_CARE_PROVIDER_SITE_OTHER): Payer: Self-pay

## 2023-12-30 NOTE — Telephone Encounter (Signed)
 Called patient with spanish interpreter Nathanel [phone]. Pt stated she would like to be call tomorrow at 11am.

## 2024-01-04 ENCOUNTER — Encounter: Admission: RE | Payer: Self-pay

## 2024-01-04 SURGERY — COLONOSCOPY, SCREENING
Anesthesia: Anesthesia General | Site: Anus

## 2024-01-04 NOTE — H&P (Signed)
 GI PRE PROCEDURE NOTE    Proceduralist Comments:   Review of Systems and Past Medical / Surgical History performed: Yes     Indications: Colon cancer screening         Physical Exam / Laboratory Data (If applicable)   General: Alert and cooperative  Lungs: Lungs clear to auscultation  Cardiac: RRR, normal S1S2.    Abdomen: Soft, non tender. Normal active bowel sounds  Other:         Planned Sedation:   Deep sedation with anesthesia    Attestation:   Phylliss D Musquiz has been reassessed immediately prior to the procedure and is an appropriate candidate for the planned sedation and procedure. Risks, benefits and alternatives to the planned procedure and sedation have been explained to the patient or guardian:  yes      Signed by: Asberry Molt, MD

## 2024-01-09 ENCOUNTER — Other Ambulatory Visit (INDEPENDENT_AMBULATORY_CARE_PROVIDER_SITE_OTHER): Payer: Self-pay | Admitting: Physician Assistant

## 2024-01-09 DIAGNOSIS — K219 Gastro-esophageal reflux disease without esophagitis: Secondary | ICD-10-CM

## 2024-01-11 ENCOUNTER — Other Ambulatory Visit (INDEPENDENT_AMBULATORY_CARE_PROVIDER_SITE_OTHER): Payer: Self-pay | Admitting: Physician Assistant

## 2024-01-11 ENCOUNTER — Telehealth: Payer: Self-pay | Admitting: Physician Assistant

## 2024-01-11 DIAGNOSIS — K219 Gastro-esophageal reflux disease without esophagitis: Secondary | ICD-10-CM

## 2024-01-11 DIAGNOSIS — R112 Nausea with vomiting, unspecified: Secondary | ICD-10-CM

## 2024-01-11 MED ORDER — PANTOPRAZOLE SODIUM 40 MG PO TBEC: 40.0000 mg | DELAYED_RELEASE_TABLET | Freq: Every day | ORAL | 0 refills | Status: DC

## 2024-01-11 MED ORDER — ONDANSETRON 4 MG PO TBDP: 4.0000 mg | ORAL_TABLET | Freq: Three times a day (TID) | ORAL | 0 refills | Status: AC | PRN

## 2024-01-11 NOTE — Telephone Encounter (Signed)
 Spoke with patient, via interpreter, as patient need to follow up with APP before rescheduling procedure. Patient scheduled for a follow up with Melanie on 12/18 in person. Patient also needs her medications as she in a lot of pain and cannot wait til her appointment. I told her I will sent a message to the clinical team.

## 2024-01-11 NOTE — Telephone Encounter (Signed)
 Pt calling in advising that she is wanting to reschedule her colonoscopy that was canceled due to poor prep    Best callback # [phone]    Please advise, Thank you

## 2024-01-11 NOTE — Telephone Encounter (Signed)
 This nurse called and spoke to patient. Advised clarification from patient on which medications she wanted a refill for.   This nurse advised that patient needs to give 3-5 business days for turnaround on prescription refills per patient stating she requested her pharmacy to send refill request on 01/08/24 (Friday).   Patient states she needs a refill on:    Refill request for pantoprazole  40mg , 1 tablet daily, 30 tabs  Last seen: 09/24/2023  Follow up next due: scheduled for f/up with Andrea Lights 01/14/24  Last refilled: 09/24/23, 40mg  tablet, 1 tablet daily, 30 tabs, 2 refills  Pharmacy:  [id number] - [place], [place] - [address]       *This medication was ordered by Dr. Tinnie Harriette Agent, MD (Family Medicine)  Refill request for ondansetron  (Zofran ) 4mg  disintegrating tab every 8 hrs PRN, 20 tabs  Last seen: 09/25/23  Follow up next due: 1 month to review results  Last refilled: 09/25/23  Pharmacy:[id number] - [place],  - [address]    Patient advised to reach out to Dr. Agent for Zofran  prescription refill. Patient insisted provider [name], [place] refill this prescription.

## 2024-01-11 NOTE — Telephone Encounter (Signed)
 Pt calling in requesting refills of her pantoprazole  and odansetron medications, Last saw Sisserson 08/2023, Follow up scheduled for 02/10/24

## 2024-01-12 NOTE — Telephone Encounter (Signed)
 This nurse called the patient, patient answered. I let the patient know both prescriptions were sent to the patient's local pharmacy, patient confirmed she picked them up yesterday- 01/11/24.   This nurse relayed the message from provider [name], PA that going forward pantoprazole  prescriptions can be requested from provider Andrea Lights, PA and Zofran  from Dr. Lynwood. Advised that the patient also give our office 3-5 business days for prescription refills. Patient verbalized understanding.     All questions and concerns addressed. Pt verbalized understanding and denies further questions at this time.

## 2024-01-14 ENCOUNTER — Encounter (INDEPENDENT_AMBULATORY_CARE_PROVIDER_SITE_OTHER): Payer: Self-pay | Admitting: Physician Assistant

## 2024-01-14 ENCOUNTER — Encounter (INDEPENDENT_AMBULATORY_CARE_PROVIDER_SITE_OTHER): Payer: Self-pay

## 2024-01-14 ENCOUNTER — Telehealth (INDEPENDENT_AMBULATORY_CARE_PROVIDER_SITE_OTHER): Payer: Self-pay

## 2024-01-14 ENCOUNTER — Ambulatory Visit (INDEPENDENT_AMBULATORY_CARE_PROVIDER_SITE_OTHER): Admitting: Physician Assistant

## 2024-01-14 ENCOUNTER — Telehealth (INDEPENDENT_AMBULATORY_CARE_PROVIDER_SITE_OTHER): Payer: Self-pay | Admitting: Physician Assistant

## 2024-01-14 VITALS — BP 120/80 | HR 94 | Ht 59.0 in | Wt 171.1 lb

## 2024-01-14 DIAGNOSIS — K5909 Other constipation: Secondary | ICD-10-CM

## 2024-01-14 DIAGNOSIS — K625 Hemorrhage of anus and rectum: Secondary | ICD-10-CM

## 2024-01-14 DIAGNOSIS — R6881 Early satiety: Secondary | ICD-10-CM

## 2024-01-14 DIAGNOSIS — K219 Gastro-esophageal reflux disease without esophagitis: Secondary | ICD-10-CM

## 2024-01-14 DIAGNOSIS — R112 Nausea with vomiting, unspecified: Secondary | ICD-10-CM

## 2024-01-14 NOTE — Telephone Encounter (Signed)
 PROCEDURE SCHEDULING REQUEST    The following patient needs to be schedule for the procedure listed below:      PATIENT: Valerie May (MRN [id number])  - **of note, patient has canceled procedure 3 times already    PROCEDURE: EGD + Colonoscopy    DIAGNOSIS: GERD, nausea and vomiting, early satiety, bloating, rectal bleeding    WITH DOCTOR: Any    TO BE SCHEDULED AT: [facility] (or any available endoscopy center, including hospital okay)      BOWEL PREP: Non-[facility] - follow bowel prep protocol (charity patients, DoD, direct access patients, procedure within 2 weeks)       IS CARDIOLOGY CLEARANCE TO HOLD MEDS OR MEDICAL RISK STRATIFICATION REQUIRED?: NO      COMMENTS / NOTES / SPECIAL NEEDS FOR PROCEDURE:       Thank you,  Andrea CHRISTELLA Lights, PA

## 2024-01-14 NOTE — Telephone Encounter (Signed)
 Egd cln sch with dr matro on Jan 7 at fx @ 4:30 pm .

## 2024-01-14 NOTE — Patient Instructions (Addendum)
 - EGD and Colonoscopy at ambulatory surgery center.   - Bowel prep GOLYTELY . Instructions discussed and provided to patient.  Clear liquid diet the day prior to procedure, with no red or purple liquids.  For optimal bowel prep advised to take Dulcolax (bisacodyl ) 15 mg tablet 2 PM day prior to procedure + GOLYTELY  split prep.   - Stop pantoprazole  two weeks prior to procedure for optimal biopsies     - Continue pantoprazole  40 mg daily empty stomach 30 mins before eating   - Antireflux measures   - Advised to avoid/minimize trigger foods - I.e caffeine  (such as coffee, tea, soda), acidic, spicy, greasy/fried foods, chocolate, peppermint, alcohol , etc.   - Consume small meals, eat slowly, chew thoroughly  - Sit upright and ambulate/walk after eating  - Avoid eating 3 to 4 hours before bedtime  - Sleep with the head of the bed elevated at least 30 degrees, consider using a wedge pillow  - Avoid tight fitted clothing  - Encourage routine exercise and weight loss   - Avoid NSAIDs/ibuprofen   - Avoid smoking and/or tobacco use     - Increase daily dietary fiber, increase intake of fruits and vegetables. 10 prunes or 2 kiwi fruit daily, apples, pears, dragon fruit, papaya may help with constipation.  - Increase hydration and drink lots of water  (recommend at least least 64 ounces of water  per day)  - Avoid/minimize intake of processed foods, limit consumption of red meat, etc.   - Routine exercise   - Continue Linzess  145 mcg daily    Fatty liver   - Recommend Mediterranean based low fat, low carbohydrate diet, routine exercise, and lifestyle modifications are imperative.   - Recommend 5-10% weight loss to decrease degree of hepatic steatosis  - Advised regular exercise, minimum of 3-4x/week moderate intensity aerobic and some resistance training   - Counseled on abstaining from alcohol  and smoking   - Avoid hepatotoxic medications including herbal supplements   - Routine liver function test with primary care provider and  management of metabolic risk factors if any       UPPER ENDOSCOPY INSTRUCTIONS AND INFO:     At least 1 week before GI procedure:  ** Please consult with your primary physician on specific instructions for diabetes and blood thinning  (anticoagulation) medications if you are taking these medications.  - Avoid taking iron pills for at least 5-7 days before GI procedure; NO NSAIDs medication for 3-4 days before GI procedure such as Motrin , Aleve , Naproxen , Ibuprofen , etc. but Acetaminophen  (Tylenol ) is OK.               NOTE 1: You MUST notify at least 4-5 business days before your procedure date in case you need to cancel/reschedule your GI procedure.     NOTE 2: If you come late (MUST come at least 1 hr before the procedure time) for paperwork and Anesthesia evaluation, then your procedure may be canceled/rescheduled or delayed until other cases are done.       NOTE 3: You MUST have a family/friend to drive or bring you home from the GI procedures.  Otherwise, the GI procedure will be canceled     1 Day Before GI Procedure:  After eating dinner, you may drink beverages till midnight.     Please STOP eating or drinking after midnight (or earlier if instructed)        Day of GI procedure:  - NO eating or drinking or chewing gum     -  Take all daily medications as instructed especially blood pressure medications EXCEPT: medications for diabetes and blood thinning (anticoagulation), which you will need to get specific instructions from your primary physician;   - You may take baby aspirin  if it is for your heart or other medical conditions as instructed.  - Avoid NSAIDs and iron pills as instructed.        *IMPORTANT NOTE ON RISK: The risks of upper endoscopy include bleeding, infection, perforation which may require surgery, sedation risks including cardiac arrest, death, as well as problems with teeth/mouth.     PLEASE NOTE:  YOU MUST HAVE SOMEONE DRIVE YOU HOME FROM YOUR GI PROCEDURE, OTHERWISE, YOUR PROCEDURE WILL  BE CANCELLED!    If any question:  [facility]  [address]  [place], [place]  [address]  Phone:[phone]  Fax:[phone]     Colonoscopy Preparation Instructions - Colyte /Gavilyte/Golytely     Please alert us  if you are taking:  Some of these medications may need to be held in advance of your procedure.  Anticoagulant blood thinners: Eliquis, Pradaxa, Xarelto, warfarin, or others  Antiplatelet blood thinners: Pavix, Brilinta, Effient, or others  Diabetes medications: Insulin , Jardiance, Januvia, Farxiga, Invokana, or similar   Some of these medications are also used to treat heart and kidney conditions.   GLP1 agonists (Weight loss/Diabetes medications): Ozempic, Wegovy, Trulicity, Mounjaro, or others  Not following these instructions may result in cancellation.    General Endoscopy Information  No food the morning of your procedure (this includes gum, mints, hard candy, cough drops, and substances such as cigarettes, tobacco, vape, or marijuana).  A responsible adult must accompany you home after the procedure, or risk procedure cancellation.  Do not drive after the procedure; arrange transportation with a responsible adult to accompany you if using taxis or ride-sharing services.  Bring insurance referral (if required), insurance card(s), copay, and valid photo ID.  Our office will verify coverage, but you will be responsible for deductibles and copays; insurance classification may affect expenses.  Contact your insurance provider directly for coverage queries.  Reach out to your GI physician's office during business hours for any questions or concerns.    Important Bowel Preparation Instructions  Strictly adhere to the prep instructions outlined in this document - this is essential for effective screening and polyp detection.  Up until 4 hours prior to your scheduled procedure time, you may consume plain water .  For example: If your procedure time is 12:00 PM, refrain from  consuming anything after 8:00 AM.  AVOID nuts, seed and roughage (salads, vegetables, fruits for 3 days prior to colonoscopy)    Medications  If instructed, contact your prescribing physician at least 2 weeks before your procedure regarding the continuation or cessation of blood thinners (excluding aspirin ).  Don't delay this discussion, as some medications may need to be stopped days in advance.  Discuss dosage adjustments with your prescribing physician to prevent low blood sugar levels.  You can continue to use other prescribed medications up until 4 hours before your scheduled procedure time.  Aspirin  can be continued.  AVOID NSAIDS (Motrin , Aleve , Naproxen , Ibuprofen , etc), Fish oil & Iron pills 5 days before colonoscopy.    Drink only clear liquids throughout the entire day prior to procedure and day of procedure. No solid food should be consumed until after the completion of your procedure.  One (1) day before the procedure: Split Dose - 1st Half  Prepare the solution: Gavilyte, Golytely , Nulytely  or Colyte  Prep:  Mix  the powder with water  in the provided plastic container to the 'fill line and chill in the refrigerator.   You may add Crystal Light powdered lemonade (as an alternative to the flavor packets) to the solution to improve its taste.  At 5 p.m.: Begin 1st dose of the prep solution at a rate of 8 ounces every 15-30 minutes (over 1-2 hours) until half of the prep solution is finished.  If you start to feel full or nauseous, slow down your pace and finish the first half of the solution before midnight.  Continue to drink clear liquids until you go to bed.     Procedure Day: Split Dose - 2nd half  6 hours prior to your procedure time: Drink the 2nd half of the prep solution.  For example: If your procedure time is 12:00 PM, begin taking your 2nd dose at 6:00 AM.  Drink the solution at a rate of 8 ounces every 15-30 minutes (over 1-2 hours) until you finish the entire solution.  Please note: You must  complete 2nd dose by 4 hours prior to your scheduled start time.   Do not take anything by mouth starting 4 hours before your procedure (this includes water ).      CLEAR LIQUID DIET   THESE ITEMS ARE ALLOWED:  Water   Clear broth: beef, chicken, vegetable  No chunks of meat within  Juices  Apple juice or cider  White grape juice, white cranberry juice  Tang  Pulp-free Lemonade  Soda (clear color) or Ginger Ale  Tea  Coffee (without milk or cream)  Clear Jell-O (without fruit inside)  Italian ices  Avoid red, blue, or purple dyes.  Popsicles (without fruit or cream)  Italian ices  Avoid red, blue, or purple dyes THESE ITEMS ARE NOT ALLOWED:    Milk  Cream  Milkshakes  Tomato juice  Orange juice  Cream soups  Any soup other than the listed broths  Oatmeal  Cream of Wheat  Grapefruit juice  Alcohol      Preparation Tips  Prepare for induced diarrhea and adjust your schedule accordingly to be near the bathroom.  If nauseated, take a 20-30-minute break before resuming the preparation slowly; some find walking helpful.  Ensure you finish the entire bowel preparation for effective colon cleaning.  Drink clear liquids throughout the day to prevent dehydration due to the potential diarrhea from the bowel preparation.  After completing the preparation, stool should be clear or yellow.   Call [phone] or [phone] if still seeing solid or brown stool.  Follow Provided Instructions: Strictly adhere to the prep instructions outlined in this document.      NOTE: You must stop ALL intake by mouth at least 4 hours prior to your procedure time - this includes even small sips of water  or other liquids (or your procedure may be cancelled or delayed)

## 2024-01-14 NOTE — Progress Notes (Addendum)
 Andrea Lights, PA-C  [facility]   [address]   [place], [place] [address]   Phone [phone] Fax [phone]      Date Time: 01/14/2024 11:42 AM   Patient Name: Valerie May     Reason for Consultation:   Follow up     Verbal consent obtained to record this visit.   History:   KELLYANNE ELLWANGER is a 53 y.o. female PMH prediabetes, pancreatic mets, CVA, OSA, who presents to the office for follow-up.  She was last seen by PA Nichole Sisserson 08/2023 for evaluation of chronic constipation, GERD, nausea and vomiting and early satiety.  She was advised to have a 4-hour gastric emptying scan PPI trial and advised To have colonoscopy completed.  However patient did not complete any of this workup -appeared to have canceled her colonoscopy three times.    Sxs:   - Complaints of acid reflux and heartburn for years now, worse in the past few months with associated nausea and nonbloody, nonbilious emesis.  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  Has been seeing Dr. Junita who recommends routine imaging.  - No prior testing for H. Pylori  - She denies any complaints of dysphagia or odynophagia, no nocturnal symptoms or awakening    - Chronic constipation for years  - Bowel movements daily while on Linzess  145 mcg, controls constipation well  - Occasional rectal bleeding, painless hematochezia when wiping, occasionally mixed in with the stool  - Abdominal pain/cramping, bloating occasionally relieved with defecation    - Denies any hematemesis, fever, or chills, loss of appetite or unintentional weight loss.      - MRI abdomen with/without contrast 11/28/2023  1. Stable enhancing 1.2 cm pancreatic head lesion, highly suspicious for underlying neuroendocrine tumor.  2. No evidence of metastatic disease in the abdomen.  3. Hepatic steatosis.    GI procedures:  - EUS 02/2023 Lonna) mass identified in the pancreatic head.  S/p FNA.    - EUS 03/2023 mass in the pancreatic head.  This staging applies if malignancy is confirmed.  Fine needle aspiration performed.  No significant pathology in the CBD.  PATH: Well-differentiated neuroendocrine tumor    - No prior EGD/Colonoscopy     - SHx CCY, hysterectomy, tubal ligation, hernia repair  - FH: No known family history of esophageal, stomach, colon cancer or other gastrointestinal malignancies   - No known cardiac or pulmonary diseases, not on any anticoagulation or antiplatelet therapy  - Non-smoker, no alcohol  use, no recreational drug use   Past Medical History:     Medical History[1]    Past Surgical History:     Past Surgical History[2]    Family History:     Family History[3]    Social History:     Social History[4]    Allergies:     Allergies[5]    Medications:     Current Medications[6]      Review of Systems:   ROS per HPI    Physical Exam:     There were no vitals filed  for this visit.    General appearance - Well developed and well nourished. Normal gait. No acute distress.   HEENT- Normocephalic. Eomi. Sclera anicteric. Normal hearing. Nares normal.  Oropharynx normal.  Neck - Supple.     Chest - clear to auscultation.   Heart - regular rate and rhythm. Clinically well perfused.   Abdomen - No focal tenderness to palpation. No abdominal distension. No guarding.     Rectal - deferred until time of colonoscopy.  Musculoskeletal - normal range of motion of arms and legs.  Extremities - no cyanosis or edema.  Skin - normal color and turgor.   Neurologic - Alert and oriented to person, place and time.  No focal motor or sensory deficits. Recent and remote memory normal.      Assessment and Plan    EGD and Colonoscopy at [facility].  EGD for further evaluation of chronic GERD, nausea and vomiting, early satiety and bloating.  Rule out Barrett's esophagus, esophagitis, gastritis, PUD, mass/malignancy. **Biopsy for H. pylori.  Colonoscopy for evaluation of rectal bleeding and that she is due for colorectal cancer screening.  GoLytely  bowel prep.  TC to PP placed.    GERD with symptoms of acid reflux and heartburn despite PPI therapy  On pantoprazole  40 mg daily with breakthrough symptoms.  No prior upper endoscopy.  No family history of Barrett's or EAC.  - Further evaluation with EGD advised to stop PPI 2 weeks prior to procedure for optimal biopsies. **Biopsy for H. pylori as unable to order urea breath test or stool antigen given current PPI use.  - Continue pantoprazole  40 mg daily empty stomach 30 mins before eating   - Antireflux measures   - Advised to avoid/minimize trigger foods - I.e caffeine  (such as coffee, tea, soda), acidic, spicy, greasy/fried foods, chocolate, peppermint, alcohol , etc.   - Consume small meals, eat slowly, chew thoroughly  - Sit upright and ambulate/walk after eating  - Avoid eating 3 to 4 hours before bedtime  - Sleep with the head of the bed elevated at least 30 degrees, consider using a wedge pillow  - Avoid tight fitted clothing  - Encourage routine exercise and weight loss   - Avoid NSAIDs/ibuprofen   - Avoid smoking and/or tobacco use     Nausea and vomiting, early satiety, bloating and dyspepsia  - Recommend further evaluation with upper endoscopy and 4-hour gastric emptying scan to rule out GP (as previously ordered)     Chronic constipation  - Increase daily dietary fiber, increase intake of fruits and vegetables. 10 prunes or 2 kiwi fruit daily, apples, pears, dragon fruit, papaya may help with constipation.  - Increase hydration and drink lots of water  (recommend at least least 64 ounces of water  per day)  - Avoid/minimize intake of processed foods,  limit consumption of red meat, etc.   - Routine exercise   - Continue Linzess  145 mcg daily qAM     Rectal bleeding, hematochezia  - Further evaluation with colonoscopy.  Patient already has GoLytely  from previously scheduled colonoscopies x 3 which she canceled.  Instructions provided and discussed with the patient.    Hepatic steatosis  - FIB4 - 0.7 - advanced fibrosis excluded  - Recommend Mediterranean based low fat, low carbohydrate diet, routine exercise, and lifestyle modifications are imperative.   - Recommend 5-10% weight loss to decrease degree of hepatic steatosis  - Advised regular exercise, minimum of 3-4x/week moderate intensity aerobic and some resistance training   - Counseled on abstaining  from alcohol  and smoking   - Avoid hepatotoxic medications including herbal supplements   - Routine liver function test with primary care provider and management of metabolic risk factors if any     6. H/o Well-differentiated neuroendocrine tumor  - oncology following, Dr. Junita. Recommended routine surveillance     RTC as needed, 2-3 weeks after procedure to discuss results and if still symptomatic at the time    All recent labs, radiologic studies, and procedure results were reviewed at the time of the visit.   Risks, benefits, alternatives of endoscopic procedures were explained and the patient verbalized understanding and agreed to proceed. Risks include, but are not confined to, bleeding, infection, perforation, risk of anesthesia.    45 minutes was spent on this encounter.  This includes the time spent reviewing pathology and imaging reports prior to the encounter as well as time spent discussing the care with other physicians and also face to face time with the patient. This time excludes any separately reportable procedures.     Preparing to see the patient (e.g., review of tests)  Obtaining and/or reviewing separately obtained history  Performing a medically appropriate examination and/or  evaluation  Counseling and educating the patient/family/caregiver  Ordering medications, tests or procedures  Communicating with other health care professionals (not separately reported)  Documenting clinical information in the electronic or other health record  Independently interpreting results (not separately reported) and communicating results to the patient/family/caregiver  Care coordination (not separately reported)     It is a pleasure to participate in the care of Mariaha May. [place]. If you have any questions or concerns, please feel free to contact us  at [phone].      Signed by: Andrea CHRISTELLA Lights, PA         [1]   Past Medical History:  Diagnosis Date    Environmental allergies     dust, mold    Kidney stone     Nausea and vomiting, unspecified vomiting type     PreOp dx for DOS 10/20/23    OSA (obstructive sleep apnea)     Uterine leiomyoma 10/11/2017   [2]   Past Surgical History:  Procedure Laterality Date    CYST REMOVAL      ESOPHAGOGASTRODUODENOSCOPY (EGD), ESOPHAGEAL ULTRASOUND (EUS) LIMITED TO THE ESOPHAGUS, STOMACH OR DUODENUM N/A 04/10/2023    Procedure: ESOPHAGOGASTRODUODENOSCOPY (EGD), ESOPHAGEAL ULTRASOUND (EUS) LIMITED TO THE ESOPHAGUS, STOMACH OR DUODENUM;  Surgeon: Abagail Carole CHRISTELLA, MD;  Location: [facility];  Service: Gastroenterology;  Laterality: N/A;    ESOPHAGOGASTRODUODENOSCOPY (EGD), ESOPHAGEAL ULTRASOUND (EUS) WITH FINE NEEDLE ASPIRATION/BIOPSY OF ESOPHAGUS, STOMACH, OR DUODENUM N/A 03/25/2023    Procedure: ESOPHAGOGASTRODUODENOSCOPY (EGD), ESOPHAGEAL ULTRASOUND (EUS) WITH FINE NEEDLE ASPIRATION/BIOPSY OF ESOPHAGUS, STOMACH, OR DUODENUM;  Surgeon: Abagail Carole CHRISTELLA, MD;  Location: [facility];  Service: Gastroenterology;  Laterality: N/A;    ESOPHAGOGASTRODUODENOSCOPY (EGD), ESOPHAGEAL ULTRASOUND (EUS) WITH FINE NEEDLE ASPIRATION/BIOPSY OF ESOPHAGUS, STOMACH, OR DUODENUM N/A 04/03/2023    Procedure: ESOPHAGOGASTRODUODENOSCOPY (EGD), ESOPHAGEAL ULTRASOUND (EUS) WITH FINE NEEDLE  ASPIRATION/BIOPSY OF ESOPHAGUS, STOMACH, OR DUODENUM;  Surgeon: Ulysess Foots, MD;  Location: [facility];  Service: Gastroenterology;  Laterality: N/A;    GALLBLADDER SURGERY      HERNIA REPAIR      HYSTERECTOMY      TUBAL LIGATION     [3]   Family History  Problem Relation Name Age of Onset    Heart attack Mother      Diabetes Father      Heart attack  Maternal Grandfather      Heart attack Paternal Grandfather      Stroke Son      Heart attack Maternal Uncle      Stomach cancer Cousin      Bone cancer Maternal Aunt      Colon cancer Neg Hx      Breast cancer Neg Hx     [4]   Social History  Socioeconomic History    Marital status: Single   Tobacco Use    Smoking status: Never     Passive exposure: Current    Smokeless tobacco: Never   Vaping Use    Vaping status: Never Used   Substance and Sexual Activity    Alcohol  use: Not Currently    Drug use: Never    Sexual activity: Not Currently     Social Drivers of Health     Financial Resource Strain: High Risk (09/23/2023)    Overall Financial Resource Strain (CARDIA)     Difficulty of Paying Living Expenses: Hard   Food Insecurity: Patient Declined (09/23/2023)    Hunger Vital Sign     Worried About Running Out of Food in the Last Year: Patient declined     Ran Out of Food in the Last Year: Patient declined   Transportation Needs: Patient Declined (09/23/2023)    PRAPARE - Therapist, Art (Medical): Patient declined     Lack of Transportation (Non-Medical): Patient declined   Physical Activity: Inactive (09/23/2023)    Exercise Vital Sign     Days of Exercise per Week: 0 days     Minutes of Exercise per Session: 0 min   Stress: Patient Declined (09/23/2023)    [employer] of Occupational Health - Occupational Stress Questionnaire     Feeling of Stress : Patient declined   Social Connections: Moderately Isolated (04/20/2023)    Social Connection and Isolation Panel     Frequency of Communication with Friends and Family: More than three  times a week     Frequency of Social Gatherings with Friends and Family: Once a week     Attends Religious Services: 1 to 4 times per year     Active Member of [employer] or Organizations: No     Attends Banker Meetings: Patient declined     Marital Status: Divorced   Catering Manager Violence: Patient Declined (09/23/2023)    Humiliation, Afraid, Rape, and Kick questionnaire     Fear of Current or Ex-Partner: Patient declined     Emotionally Abused: Patient declined     Physically Abused: Patient declined     Sexually Abused: Patient declined   Housing Stability: Patient Declined (09/23/2023)    Housing Stability NCSS     Do you have housing?: Patient declined     Are you worried about losing your housing?: Patient declined   [5]   Allergies  Allergen Reactions    Benadryl  [Diphenhydramine ]     Methocarbamol Itching    Compazine [Prochlorperazine] Anxiety   [6]   Current Outpatient Medications:     acetaminophen  (TYLENOL ) 650 MG CR tablet, TAKE 1 TABLET BY MOUTH EVERY 8 HOURS AS NEEDED FOR PAIN, Disp: 90 tablet, Rfl: 1    albuterol  sulfate HFA (PROVENTIL ) 108 (90 Base) MCG/ACT inhaler, Inhale 1-2 puffs into the lungs every 4 (four) hours as needed, Disp: , Rfl:     benzonatate  (TESSALON ) 200 MG capsule, Take 1 capsule (200 mg) by mouth 3 (three) times daily  as needed for Cough, Disp: , Rfl:     Budeson-Glycopyrrol-Formoterol  160-9-4.8 MCG/ACT Aerosol, Inhale 2 puffs into the lungs 2 (two) times daily, Disp: 10.7 g, Rfl: 5    budesonide -formoterol  (SYMBICORT ) 160-4.5 MCG/ACT inhaler, Inhale 2 puffs into the lungs 2 (two) times daily, Disp: 1 each, Rfl: 0    celecoxib  (CeleBREX ) 100 MG capsule, Take 2 capsules (200 mg) by mouth once daily (Patient not taking: Reported on 12/21/2023), Disp: 30 capsule, Rfl: 3    celecoxib  (CeleBREX ) 100 MG capsule, Take 2 capsules (200 mg) by mouth once daily (Patient not taking: Reported on 12/21/2023), Disp: 30 capsule, Rfl: 3    cetirizine  (ZyrTEC ) 10 MG tablet, Take 1  tablet (10 mg) by mouth daily (Patient not taking: Reported on 12/21/2023), Disp: , Rfl:     diphenhydrAMINE -APAP, sleep, (Excedrin PM) 38-500 MG Tab, Take by mouth daily, Disp: , Rfl:     ergocalciferol  (ERGOCALCIFEROL ) 1.25 MG (50000 UT) capsule, Take 1 capsule (50,000 Units) by mouth once a week, Disp: 12 capsule, Rfl: 1    escitalopram  (LEXAPRO ) 10 MG tablet, Take 1 tablet (10 mg) by mouth once daily (Patient not taking: Reported on 12/21/2023), Disp: 90 tablet, Rfl: 3    gabapentin  (NEURONTIN ) 300 MG capsule, Take 1 capsule (300 mg) by mouth once nightly (Patient not taking: Reported on 12/21/2023), Disp: 30 capsule, Rfl: 1    ibuprofen  (ADVIL ) 600 MG tablet, Take 1 tablet (600 mg) by mouth every 6 (six) hours as needed for Pain, Disp: 30 tablet, Rfl: 1    lidocaine  (LIDODERM ) 5 %, Place 1 patch onto the skin in the morning. Remove & Discard patch within 12 hours or as directed by MD. (Patient not taking: Reported on 12/21/2023), Disp: 30 patch, Rfl: 0    lidocaine  (LIDODERM ) 5 %, Place 1 patch onto the skin in the morning. Remove & Discard patch within 12 hours or as directed by MD. (Patient not taking: Reported on 12/21/2023), Disp: 12 patch, Rfl: 2    lidocaine  (LIDODERM ) 5 %, Place 1 patch onto the skin in the morning. Remove & Discard patch within 12 hours or as directed by MD. (Patient not taking: Reported on 12/21/2023), Disp: 12 patch, Rfl: 2    linaCLOtide  (LINZESS ) 145 MCG capsule, Take 1 capsule (145 mcg) by mouth once daily, Disp: 30 capsule, Rfl: 2    meloxicam (MOBIC) 15 MG tablet, Take 1 tablet (15 mg) by mouth once daily (Patient not taking: Reported on 12/21/2023), Disp: , Rfl:     montelukast  (SINGULAIR ) 10 MG tablet, Take 1 tablet (10 mg) by mouth nightly, Disp: 90 tablet, Rfl: 3    ondansetron  (ZOFRAN -ODT) 4 MG disintegrating tablet, Dissolve 1 tablet (4 mg) in the mouth every 8 (eight) hours as needed for Nausea, Disp: 9 tablet, Rfl: 0    pantoprazole  (PROTONIX ) 40 MG tablet, Take 1 tablet  (40 mg) by mouth once daily, Disp: 30 tablet, Rfl: 0    polyethylene glycol (MIRALAX ) 17 GM/SCOOP powder, Take 17 g by mouth daily, Disp: 90 each, Rfl: 3    senna-docusate (PERICOLACE) 8.6-50 MG per tablet, Take 2 tablets by mouth nightly (Patient not taking: Reported on 12/21/2023), Disp: 60 tablet, Rfl: 0    SUMAtriptan  (IMITREX ) 25 MG tablet, TAKE 1 TABLET BY MOUTH EVERY 2 HOURS AS NEEDED FOR MIGRAINE HEADACHE, Disp: 10 tablet, Rfl: 2    traMADol  (ULTRAM ) 50 MG tablet, Take 1 tablet (50 mg) by mouth as needed for Pain, Disp: , Rfl:  umeclidinium bromide  (INCRUSE ELLIPTA ) 62.5 MCG/ACT Aerosol Pwdr, Breath Activated, Inhale 1 puff into the lungs daily, Disp: 1 each, Rfl: 0

## 2024-01-14 NOTE — Telephone Encounter (Signed)
 Called pt - was unable to leave voice mail - send mychart msg-

## 2024-01-23 ENCOUNTER — Encounter (INDEPENDENT_AMBULATORY_CARE_PROVIDER_SITE_OTHER): Payer: Self-pay

## 2024-01-23 ENCOUNTER — Ambulatory Visit (INDEPENDENT_AMBULATORY_CARE_PROVIDER_SITE_OTHER)

## 2024-01-23 NOTE — PSS Phone Screening (Signed)
 Pre-Anesthesia Evaluation    Pre-op phone visit requested by:   Reason for pre-op phone visit: Patient anticipating COLONOSCOPY, SCREENING, ESOPHAGOGASTRODUODENOSCOPY (EGD), SCREENING procedure.    01/14/24 GE ov/h&p  12/21/23 Onc ov   04/08/23 EKG  03/21/23 echo    No orders of the defined types were placed in this encounter.      History of Present Illness/Summary:      Problem List:  Medical Problems       Hospital Problem List  Date Reviewed: 09/24/2023   None        Non-Hospital Problem List  Date Reviewed: 09/24/2023          ICD-10-CM Priority Class Noted Diagnosed    GERD (gastroesophageal reflux disease) (Chronic) K21.9   02/19/2016     Lumbar spondylosis (Chronic) M47.816   04/22/2017     OSA (obstructive sleep apnea) (Chronic) G47.33   10/04/2016     Vitamin D  deficiency (Chronic) E55.9   11/08/2020     History of CVA (cerebrovascular accident) (Chronic) Z86.73   11/14/2020     Asthma (Chronic) J45.909   12/10/2022     Bronchiectasis without complication (CMS/HCC) (Chronic) J47.9   12/10/2022     Personal history of pneumonia (recurrent) (Chronic) Z87.01   12/10/2022     Chronic cough (Chronic) R05.3   12/10/2022     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   05/08/2023     Depression with anxiety (Chronic) F41.8   05/08/2023     Migraines (Chronic) G43.909   05/08/2023     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   05/08/2023     Prediabetes (Chronic) R73.03   05/08/2023         Medical History   Diagnosis Date    Cerebrovascular accident (CMS/HCC) 2014    pt unsure -  w/ ride side wkness at that time, no deficit now    Chronic constipation     Depression     Early satiety     Environmental allergies     dust, mold    Gastroesophageal reflux disease     Kidney stone     Nausea and vomiting, unspecified vomiting type     PreOp dx for DOS 10/20/23    OSA (obstructive sleep apnea)     snoring only, no dx    Pancreatic tumor     Uterine leiomyoma 10/11/2017     Past Surgical History[1]     Medication List             Accurate as of January 23, 2024 10:13 AM. Always use your most recent med list.                acetaminophen  650 MG CR tablet  TAKE 1 TABLET BY MOUTH EVERY 8 HOURS AS NEEDED FOR PAIN  Commonly known as: TYLENOL   Medication Adjustments for Surgery: Take as needed     albuterol  sulfate HFA 108 (90 Base) MCG/ACT inhaler  Inhale 1-2 puffs into the lungs every 4 (four) hours as needed  Commonly known as: PROVENTIL   Medication Adjustments for Surgery: Take morning of surgery     Budeson-Glycopyrrol-Formoterol  160-9-4.8 MCG/ACT Aero  Inhale 2 puffs into the lungs 2 (two) times daily  Notes to patient: Not taking     * celecoxib  100 MG capsule  Take 2 capsules (200 mg) by mouth once daily  Commonly known as: CeleBREX   Notes to patient: Not taking     * celecoxib  100 MG capsule  Take  2 capsules (200 mg) by mouth once daily  Commonly known as: CeleBREX   Notes to patient: Not taking     ergocalciferol  1.25 MG (50000 UT) capsule  Take 1 capsule (50,000 Units) by mouth once a week  Commonly known as: ERGOCALCIFEROL   Notes to patient: Not taking     escitalopram  10 MG tablet  Take 1 tablet (10 mg) by mouth once daily  Commonly known as: LEXAPRO   Medication Adjustments for Surgery: Take morning of surgery     Excedrin PM 500-38 MG Tabs  Take by mouth daily  Generic drug: diphenhydrAMINE -APAP (sleep)  Medication Adjustments for Surgery: Stop 7 days before surgery     gabapentin  300 MG capsule  Take 1 capsule (300 mg) by mouth once nightly  Commonly known as: NEURONTIN   Notes to patient: Not taking     ibuprofen  600 MG tablet  Take 1 tablet (600 mg) by mouth every 6 (six) hours as needed for Pain  Commonly known as: ADVIL   Medication Adjustments for Surgery: Last dose 24 hours before surgery     * lidocaine  5 %  Place 1 patch onto the skin in the morning. Remove & Discard patch within 12 hours or as directed by MD.  Commonly known as: LIDODERM   Notes to patient: Not taking     * lidocaine  5 %  Place 1 patch onto the skin in the  morning. Remove & Discard patch within 12 hours or as directed by MD.  Commonly known as: LIDODERM   Notes to patient: Not taking     linaCLOtide  145 MCG capsule  Take 1 capsule (145 mcg) by mouth once daily  Commonly known as: LINZESS   Medication Adjustments for Surgery: Take morning of surgery     meloxicam 15 MG tablet  Take 1 tablet (15 mg) by mouth once daily  Commonly known as: MOBIC  Medication Adjustments for Surgery: Stop 5 days before surgery     montelukast  10 MG tablet  Take 1 tablet (10 mg) by mouth nightly  Commonly known as: SINGULAIR   Medication Adjustments for Surgery: Take as prescribed     ondansetron  4 MG disintegrating tablet  Dissolve 1 tablet (4 mg) in the mouth every 8 (eight) hours as needed for Nausea  Commonly known as: ZOFRAN -ODT  Medication Adjustments for Surgery: Take as needed     pantoprazole  40 MG tablet  Take 1 tablet (40 mg) by mouth once daily  Commonly known as: PROTONIX   Medication Adjustments for Surgery: Hold day of surgery     polyethylene glycol 17 GM/SCOOP powder  Take 17 g by mouth daily  Commonly known as: MIRALAX   Medication Adjustments for Surgery: Hold day of surgery     senna-docusate 8.6-50 MG per tablet  Take 2 tablets by mouth nightly  Commonly known as: PERICOLACE  Medication Adjustments for Surgery: Take as prescribed     SUMAtriptan  25 MG tablet  TAKE 1 TABLET BY MOUTH EVERY 2 HOURS AS NEEDED FOR MIGRAINE HEADACHE  Commonly known as: IMITREX   Medication Adjustments for Surgery: Take as needed           * This list has 4 medication(s) that are the same as other medications prescribed for you. Read the directions carefully, and ask your doctor or other care provider to review them with you.                Allergies[2]  Family History[3]  Social History     Occupational History    Not on  file   Tobacco Use    Smoking status: Never     Passive exposure: Current    Smokeless tobacco: Never   Vaping Use    Vaping status: Never Used   Substance and Sexual Activity     Alcohol  use: Not Currently    Drug use: Never    Sexual activity: Not on file       Menstrual History:   LMP / Status  Hysterectomy     Patient's last menstrual period was 03/18/2012.    Tubal Ligation?  No valid surgical or medical questions entered.               Exam Scores:   SDB score  OSA Risk Category: No Risk        STBUR score       PONV score  Nausea Risk: MODERATE RISK    MST score  MST Score: 0    PEN-FAST score       Frailty score       CHADsVasc            Visit Vitals  Ht 1.499 m (4' 11)   Wt 77.6 kg (171 lb)   LMP 03/18/2012   BMI 34.54 kg/m   Obese based on BMI.                                       [1]   Past Surgical History:  Procedure Laterality Date    CYST REMOVAL  2015    abdomen    ESOPHAGOGASTRODUODENOSCOPY (EGD), ESOPHAGEAL ULTRASOUND (EUS) LIMITED TO THE ESOPHAGUS, STOMACH OR DUODENUM N/A 04/10/2023    Procedure: ESOPHAGOGASTRODUODENOSCOPY (EGD), ESOPHAGEAL ULTRASOUND (EUS) LIMITED TO THE ESOPHAGUS, STOMACH OR DUODENUM;  Surgeon: Abagail Carole HERO, MD;  Location: [facility];  Service: Gastroenterology;  Laterality: N/A;    ESOPHAGOGASTRODUODENOSCOPY (EGD), ESOPHAGEAL ULTRASOUND (EUS) WITH FINE NEEDLE ASPIRATION/BIOPSY OF ESOPHAGUS, STOMACH, OR DUODENUM N/A 03/25/2023    Procedure: ESOPHAGOGASTRODUODENOSCOPY (EGD), ESOPHAGEAL ULTRASOUND (EUS) WITH FINE NEEDLE ASPIRATION/BIOPSY OF ESOPHAGUS, STOMACH, OR DUODENUM;  Surgeon: Abagail Carole HERO, MD;  Location: [facility];  Service: Gastroenterology;  Laterality: N/A;    ESOPHAGOGASTRODUODENOSCOPY (EGD), ESOPHAGEAL ULTRASOUND (EUS) WITH FINE NEEDLE ASPIRATION/BIOPSY OF ESOPHAGUS, STOMACH, OR DUODENUM N/A 04/03/2023    Procedure: ESOPHAGOGASTRODUODENOSCOPY (EGD), ESOPHAGEAL ULTRASOUND (EUS) WITH FINE NEEDLE ASPIRATION/BIOPSY OF ESOPHAGUS, STOMACH, OR DUODENUM;  Surgeon: Ulysess Foots, MD;  Location: [facility];  Service: Gastroenterology;  Laterality: N/A;    GALLBLADDER SURGERY  2009    HERNIA REPAIR  2014    HYSTERECTOMY  2020    NOSE  SURGERY  2009    for fx    TUBAL LIGATION  1999   [2]   Allergies  Allergen Reactions    Benadryl  [Diphenhydramine ] Other (See Comments)     Pt unsure    Compazine [Prochlorperazine] Anxiety    Methocarbamol Itching     Pt denies   [3]   Family History  Problem Relation Name Age of Onset    Heart attack Mother      Diabetes Father      Stroke Son      Heart attack Paternal Grandfather      Heart attack Maternal Grandfather      Bone cancer Maternal Aunt      Heart attack Maternal Uncle      Stomach cancer Cousin

## 2024-01-25 ENCOUNTER — Emergency Department

## 2024-01-25 ENCOUNTER — Emergency Department
Admission: EM | Admit: 2024-01-25 | Discharge: 2024-01-26 | Disposition: A | Discharge status: Home / Self Care | Attending: Emergency Medicine | Admitting: Emergency Medicine

## 2024-01-25 DIAGNOSIS — G8929 Other chronic pain: Secondary | ICD-10-CM | POA: Insufficient documentation

## 2024-01-25 DIAGNOSIS — R079 Chest pain, unspecified: Secondary | ICD-10-CM

## 2024-01-25 DIAGNOSIS — R110 Nausea: Secondary | ICD-10-CM | POA: Insufficient documentation

## 2024-01-25 DIAGNOSIS — K5793 Diverticulitis of intestine, part unspecified, without perforation or abscess with bleeding: Secondary | ICD-10-CM | POA: Insufficient documentation

## 2024-01-25 DIAGNOSIS — K5792 Diverticulitis of intestine, part unspecified, without perforation or abscess without bleeding: Secondary | ICD-10-CM

## 2024-01-25 DIAGNOSIS — K219 Gastro-esophageal reflux disease without esophagitis: Secondary | ICD-10-CM | POA: Insufficient documentation

## 2024-01-25 DIAGNOSIS — R0602 Shortness of breath: Secondary | ICD-10-CM

## 2024-01-25 DIAGNOSIS — K5909 Other constipation: Secondary | ICD-10-CM | POA: Insufficient documentation

## 2024-01-25 DIAGNOSIS — Z90710 Acquired absence of both cervix and uterus: Secondary | ICD-10-CM | POA: Insufficient documentation

## 2024-01-25 DIAGNOSIS — R109 Unspecified abdominal pain: Secondary | ICD-10-CM

## 2024-01-25 LAB — LAB USE ONLY - CBC WITH DIFFERENTIAL
Absolute Basophils: 0.08 x10 3/uL (ref 0.00–0.08)
Absolute Eosinophils: 0.46 x10 3/uL — ABNORMAL HIGH (ref 0.00–0.44)
Absolute Immature Granulocytes: 0.03 x10 3/uL (ref 0.00–0.07)
Absolute Lymphocytes: 2.99 x10 3/uL (ref 0.42–3.22)
Absolute Monocytes: 0.55 x10 3/uL (ref 0.21–0.85)
Absolute Neutrophils: 3.5 x10 3/uL (ref 1.10–6.33)
Absolute nRBC: 0 x10 3/uL (ref <=0.00)
Basophils %: 1.1 %
Eosinophils %: 6 %
Hematocrit: 42.5 % (ref 34.7–43.7)
Hemoglobin: 13.9 g/dL (ref 11.4–14.8)
Immature Granulocytes %: 0.4 %
Lymphocytes %: 39.3 %
MCH: 29.6 pg (ref 25.1–33.5)
MCHC: 32.7 g/dL (ref 31.5–35.8)
MCV: 90.6 fL (ref 78.0–96.0)
MPV: 9.5 fL (ref 8.9–12.5)
Monocytes %: 7.2 %
Neutrophils %: 46 %
Platelet Count: 456 x10 3/uL — ABNORMAL HIGH (ref 142–346)
Preliminary Absolute Neutrophil Count: 3.5 x10 3/uL (ref 1.10–6.33)
RBC: 4.69 x10 6/uL (ref 3.90–5.10)
RDW: 12 % (ref 11–15)
WBC: 7.61 x10 3/uL (ref 3.10–9.50)
nRBC %: 0 /100{WBCs} (ref <=0.0)

## 2024-01-25 LAB — COMPREHENSIVE METABOLIC PANEL
ALT: 40 U/L (ref <=55)
AST (SGOT): 40 U/L (ref <=41)
Albumin/Globulin Ratio: 1.1 (ref 0.9–2.2)
Albumin: 3.9 g/dL (ref 3.5–4.9)
Alkaline Phosphatase: 132 U/L — ABNORMAL HIGH (ref 37–117)
Anion Gap: 10 (ref 5.0–15.0)
BUN: 10 mg/dL (ref 7–21)
Bilirubin, Total: 0.1 mg/dL — ABNORMAL LOW (ref 0.2–1.2)
CO2: 25 meq/L (ref 17–29)
Calcium: 9.5 mg/dL (ref 8.5–10.5)
Chloride: 105 meq/L (ref 99–111)
Creatinine: 0.5 mg/dL (ref 0.4–1.0)
GFR: >60 mL/min/1.73 m2 (ref >=60.0)
Globulin: 3.6 g/dL (ref 2.0–3.6)
Glucose: 115 mg/dL — ABNORMAL HIGH (ref 70–100)
Potassium: 4.3 meq/L (ref 3.5–5.3)
Protein, Total: 7.5 g/dL (ref 6.0–8.3)
Sodium: 140 meq/L (ref 135–145)

## 2024-01-25 LAB — ECG 12-LEAD
Atrial Rate: 96 {beats}/min
IHS MUSE NARRATIVE AND IMPRESSION: NORMAL
P Axis: 46 degrees
P-R Interval: 134 ms
Q-T Interval: 374 ms
QRS Duration: 100 ms
QTC Calculation (Bezet): 472 ms
R Axis: 4 degrees
T Axis: 43 degrees
Ventricular Rate: 96 {beats}/min

## 2024-01-25 LAB — LIPASE: Lipase: 18 U/L (ref 8–78)

## 2024-01-25 LAB — HIGH SENSITIVITY TROPONIN-I: hs Troponin: <2.7 ng/L (ref <=14.0)

## 2024-01-25 MED ORDER — MAGNESIUM HYDROXIDE 400 MG/5ML PO SUSP: 15.0000 mL | Freq: Every day | ORAL | 0 refills | Status: AC | PRN

## 2024-01-25 MED ORDER — ONDANSETRON HCL 4 MG/2ML IJ SOLN: 4.0000 mg | Freq: Once | INTRAMUSCULAR | Status: DC

## 2024-01-25 MED ORDER — AMOXICILLIN-POT CLAVULANATE 875-125 MG PO TABS
1.0000 | ORAL_TABLET | Freq: Once | ORAL | Status: AC
  Administered 2024-01-25: 1 via ORAL
  Filled 2024-01-25: qty 1

## 2024-01-25 MED ORDER — SUCRALFATE 1 G PO TABS: 1.0000 g | ORAL_TABLET | Freq: Four times a day (QID) | ORAL | 0 refills | Status: AC

## 2024-01-25 MED ORDER — FAMOTIDINE 20 MG PO TABS: 20.0000 mg | ORAL_TABLET | Freq: Two times a day (BID) | ORAL | 0 refills | Status: AC

## 2024-01-25 MED ORDER — SODIUM CHLORIDE 0.9 % IV BOLUS
500.0000 mL | Freq: Once | INTRAVENOUS | Status: AC
  Administered 2024-01-25: 500 mL via INTRAVENOUS
  Filled 2024-01-25: qty 500

## 2024-01-25 MED ORDER — SUCRALFATE 1 G PO TABS
1.0000 g | ORAL_TABLET | Freq: Once | ORAL | Status: AC
  Administered 2024-01-25: 1 g via ORAL
  Filled 2024-01-25: qty 1

## 2024-01-25 MED ORDER — IOHEXOL 350 MG/ML IV SOLN
100.0000 mL | Freq: Once | INTRAVENOUS | Status: AC | PRN
  Administered 2024-01-25: 100 mL via INTRAVENOUS
  Filled 2024-01-25: qty 100

## 2024-01-25 MED ORDER — FAMOTIDINE 10 MG/ML IV SOLN (WRAP)
20.0000 mg | Freq: Once | INTRAVENOUS | Status: AC
  Administered 2024-01-25: 20 mg via INTRAVENOUS
  Filled 2024-01-25: qty 2

## 2024-01-25 MED ORDER — AMOXICILLIN-POT CLAVULANATE 875-125 MG PO TABS: 1.0000 | ORAL_TABLET | Freq: Two times a day (BID) | ORAL | 0 refills | Status: AC

## 2024-01-25 MED ORDER — METOCLOPRAMIDE HCL 5 MG PO TABS: 5.0000 mg | ORAL_TABLET | Freq: Four times a day (QID) | ORAL | 0 refills | Status: AC

## 2024-01-25 MED ORDER — METOCLOPRAMIDE HCL 5 MG/ML IJ SOLN
10.0000 mg | Freq: Once | INTRAMUSCULAR | Status: AC
  Administered 2024-01-25: 10 mg via INTRAVENOUS
  Filled 2024-01-25: qty 2

## 2024-01-25 MED ORDER — MORPHINE SULFATE 4 MG/ML IJ/IV SOLN (WRAP): 4.0000 mg | Freq: Once | Status: DC

## 2024-01-25 NOTE — ED Triage Notes (Signed)
 Pt c/o abdominal pain since Friday. Endorses SOB and dizziness. Speaking in full sentences. PmHx of small mass on liver, unsure whether cancerous. A&O x4.

## 2024-01-25 NOTE — Discharge Instructions (Addendum)
 VISIT SUMMARY:  Today, you were seen for new left-sided abdominal pain and chronic constipation. You have a history of a pancreatic tumor, chronic constipation, and gastroesophageal reflux disease. Your recent CT scan showed diverticulitis, which is inflammation in the colon, but no signs of a surgical emergency. We discussed your symptoms, including nausea, vomiting, and anal irritation, and reviewed your current medications.    YOUR PLAN:  -ACUTE DIVERTICULITIS: Diverticulitis is an inflammation of small pouches in the colon. You will take antibiotics (Augmentin ) and anti-inflammatory medications to treat this. We also provided IV medications for pain relief and will transition you to oral medications for home use.    -CHRONIC CONSTIPATION: Chronic constipation is difficulty in passing stools regularly. You will continue using Lansinoh and Miralax , and we have prescribed Milk of Magnesia to help soften your stool. We also provided a topical treatment for anal irritation.    -GASTROESOPHAGEAL REFLUX DISEASE (GERD): GERD is a condition where stomach acid frequently flows back into the tube connecting your mouth and stomach. We have prescribed Carafate  and Pepcid  at higher doses for acid relief and advised you to discontinue pantoprazole  as per GI instructions.    -PANCREATIC MASS: A pancreatic mass is an abnormal growth in the pancreas. Your CT scan showed no acute changes or complications related to this mass, so it is not the primary cause of your current symptoms.    INSTRUCTIONS:  Please follow up with your scheduled colonoscopy on January 7th. Continue taking your prescribed medications as directed. We have also ordered a urine sample to evaluate for any genitourinary irritation. If your symptoms worsen or you experience new symptoms, please contact our office immediately.

## 2024-01-25 NOTE — ED Provider Notes (Signed)
 [facility]  Emergency Department Physician Note      Diagnosis/Disposition     ED Disposition:  Data Unavailable    ED Diagnosis:     Diverticulitis  Chronic abdominal pain  Chronic constipation    Discharge Prescription    AMOXICILLIN -CLAVULANATE (AUGMENTIN ) 875-125 MG PER TABLET    Take 1 tablet by mouth 2 (two) times daily for 7 days    FAMOTIDINE  (PEPCID ) 20 MG TABLET    Take 1 tablet (20 mg) by mouth 2 (two) times daily for 5 days    MAGNESIUM  HYDROXIDE (MILK OF MAGNESIA) 400 MG/5ML SUSPENSION    Take 15 mLs by mouth once daily as needed for Constipation    METOCLOPRAMIDE  (REGLAN ) 5 MG TABLET    Take 1 tablet (5 mg) by mouth 4 (four) times daily for 7 days    SUCRALFATE  (CARAFATE ) 1 G TABLET    Take 1 tablet (1 g) by mouth 4 (four) times daily for 5 days         History of Present Illness       Nursing Triage Note: Abd pain, diarrhea, SOB since Friday. Endorses small amount of blood in stool. Seen at [facility] Friday sent to ED to r/o appendicitis. Hx pancreatic tumor, followed by Oncologist. NAD. AOx4.  Chief Complaint: Abdominal Pain, Chest Pain, Shortness of Breath, and Mass      History of Present Illness  Valerie May is a 53 year old female with a pancreatic tumor who presents with new onset left-sided abdominal pain and chronic constipation.    She has been experiencing left-sided abdominal pain since Friday, described as 'pain and pressure'. This pain is new and distinct from her chronic abdominal issues. She also reports a sensation of fullness and pressure in her abdomen, likening it to feeling 'like a balloon'.    She has a history of chronic constipation for approximately three years, requiring the use of Miralax  and other medications to aid bowel movements. She describes severe constipation, sometimes needing manual evacuation, and has not had a colonoscopy yet, though one is scheduled for January 7th. She also experiences a burning sensation in her anus, which sometimes bleeds.    In  March of this year, she was diagnosed with a pancreatic tumor, which has not required chemotherapy or radiation. She has been treated for nausea and vomiting, which she attributes to her inability to retain food. She takes pantoprazole  to manage these symptoms, stating 'si no me la tomo, no paro de vomitar'.    She has a history of abdominal surgeries, including hernia repair, gallbladder removal, and hysterectomy. She reports additional symptoms of nausea, vomiting, and a burning sensation in her private area, prompting a request for a urine test. She experiences difficulty breathing.    Her current medications include pantoprazole , Miralax , and Zofran , among others for constipation and nausea. She also mentions taking Excedrin, sumatriptan , aspirin , Advil , and ibuprofen .    Positive and negative ROS per above and in HPI. All other systems reviewed and negative    The patient needed interpreter services.  Live hospital interpreter used, Spanish    Review of records including outpatient GI notes and previous ED visits:  :: PMH prediabetes, pancreatic mets, CVA, OSA   :: Pancreatic neuroendocrine tumor, head of pancreas, stable     Physical Exam     Pulse 97  BP 111/72  Resp 16  SpO2 97 %  Temp 98.1 F (36.7 C)     Physical Exam  GENERAL APPEARANCE: Alert, well appearing, and in no distress.  MENTAL STATUS: Alert, oriented to person, place, and time.  MOUTH: Mucous membranes moist  NECK: Supple, no significant adenopathy, no rigidity.  CHEST: Clear to auscultation, no wheezes, rales or rhonchi, symmetric air entry.  HEART: Normal rate, regular rhythm, normal S1, S2, no murmurs, rubs, clicks or gallops.  ABDOMEN: Obese, Soft, tenderness in the upper abdomen and lower left abdomen, nondistended, no masses or organomegaly. No murphys. No gaurding.  NEUROLOGIC: Alert, oriented, normal speech, no focal findings or movement disorder.     Medical Decision Making       Valerie May is a 53 y.o. female presenting for  abdominal pain acute on chronic  Vitals reviewed by me =  stable.   Nursing notes reviewed by me and agree with findings    Initial Differential Diagnosis:  Initial differential diagnosis to include but not limited to: metastatic disease and appendicitis, bowel obstruction, intussusception, ruptured ovarian cyst, AAA, aortic dissection, biliary pathology, ischemic bowel, colitis, urinary tract infection, diverticulitis, intraabdominal abscess, enteritis, renal stone      Medical Decision Making  53 year old female with a history of chronic constipation, pancreatic mass, prior abdominal surgeries, and gastroesophageal reflux disease presented with new-onset left-sided abdominal pain since Friday, associated with nausea, vomiting, and increased difficulty with bowel movements. She also reported anal irritation and bleeding, and upper abdominal pressure. CT imaging revealed left lower quadrant diverticulitis and a known pancreatic mass, with no evidence of surgical emergency or acute complications. Exam findings included tenderness in the upper and lower abdomen, with more severe pain in the upper abdomen.    Differential diagnosis includes, but is not limited to:  - Acute diverticulitis: CT imaging demonstrated inflammation of the diverticula in the left lower abdomen, consistent with diverticulitis and correlating with her recent onset of left-sided abdominal pain.  - Gastroesophageal reflux disease: Upper abdominal pain and pressure are likely multifactorial, with significant contribution from acid reflux, especially given her history and need for acid suppression therapy.  - Chronic constipation: Chronic constipation is contributing to abdominal pain, pressure, and anal irritation, with clinical evidence of severe stool retention and straining.  - Pancreatic mass: Known pancreatic mass was noted on CT without evidence of acute change or complication; not considered the primary cause of her current symptoms.  -  Surgical emergency (e.g., bowel perforation, obstruction): CT scan did not reveal any findings suggestive of surgical emergency, such as perforation or obstruction, making these etiologies highly unlikely.  - Pulmonary complications (e.g., infection, blood clots, metastasis): CT imaging did not show any evidence of pulmonary infection, embolism, or metastatic disease, and respiratory symptoms are attributed to abdominal pressure from constipation.    Acute diverticulitis and chronic constipation with associated abdominal pain, anal irritation, and gastroesophageal reflux disease  - Administered anti-inflammatory medications and antibiotics (Augmentin ) for diverticulitis  - Administered IV medications for pain relief and transitioned to oral medications for home use  - Prescribed Milk of Magnesia to soften stool  - Advised continuation of Lansinoh and Miralax  for constipation  - Prescribed topical treatment for anal irritation  - Prescribed Carafate  and Pepcid  at higher doses for acid relief  - Advised discontinuation of pantoprazole  as per GI instructions  - Ordered urine sample to evaluate for genitourinary irritation       Final Impression:  Abdominal pain, diverticulitis, constipation  The patient was deemed stable for discharge. They were given strict return precautions as it relates to their presumed diagnosis, verbalized  understanding of these precautions and agreed to follow up as instructed. All questions were answered prior to discharge.                             Interpretations         EKG: I reviewed and independently interpreted the patient's EKG as: normal sinus rhythm rate 98, normal intervals, no ST changes, no chest pain. No change when compared to previous             Procedures      Procedures    Imaging Results    None               Supplemental Encounter Data   Medical History[7]  Past Surgical History[8]  Social History[9]  Family History[10]  Allergies[11]    Encounter Orders:  Orders  Placed This Encounter   Procedures    CT Angio Chest (PE or trauma protocol)    CT Abdomen Pelvis W IV/ WO PO Cont    CBC with Differential (Order)    Comprehensive Metabolic Panel    Lipase    High Sensitivity Troponin-I    Urinalysis with Reflex to Microscopic Exam and Culture    Urine Elnor Culture Hold Tube    CBC with Differential (Component)    POCT Pregnancy Test, Urine HCG    ECG 12 Lead     Medications Administered:  Medications   famotidine  (PEPCID ) injection 20 mg (has no administration in time range)   sucralfate  (CARAFATE ) tablet 1 g (has no administration in time range)   metoclopramide  (REGLAN ) injection 10 mg (has no administration in time range)   amoxicillin -clavulanate (AUGMENTIN ) 875-125 MG per tablet 1 tablet (has no administration in time range)   sodium chloride  0.9 % bolus 500 mL (500 mLs Intravenous New Bag 01/25/24 2236)   iohexol  (OMNIPAQUE ) 350 MG/ML injection 100 mL (100 mLs Intravenous Imaging Agent Given 01/25/24 2159)     Laboratory and Imaging Studies:  Results for orders placed or performed during the hospital encounter of 01/25/24 (from the past 24 hours)   Comprehensive Metabolic Panel    Collection Time: 01/25/24  8:23 PM   Result Value    Glucose 115 (H)    BUN 10    Creatinine 0.5    Sodium 140    Potassium 4.3    Chloride 105    CO2 25    Calcium  9.5    Anion Gap 10.0    GFR >60.0    AST (SGOT) 40    ALT 40    Alkaline Phosphatase 132 (H)    Albumin 3.9    Protein, Total 7.5    Globulin 3.6    Albumin/Globulin Ratio 1.1    Bilirubin, Total 0.1 (L)    Collection Time: 01/25/24  8:23 PM   Result Value    Lipase 18   High Sensitivity Troponin-I    Collection Time: 01/25/24  8:23 PM   Result Value    hs Troponin <2.7   CBC with Differential (Component)    Collection Time: 01/25/24  8:23 PM   Result Value    WBC 7.61    Hemoglobin 13.9    Hematocrit 42.5    Platelet Count 456 (H)    MPV 9.5    RBC 4.69    MCV 90.6    MCH 29.6    MCHC 32.7    RDW 12    nRBC %  0.0    Absolute nRBC  0.00    Preliminary Absolute Neutrophil Count 3.50    Neutrophils % 46.0    Lymphocytes % 39.3    Monocytes % 7.2    Eosinophils % 6.0    Basophils % 1.1    Immature Granulocytes % 0.4    Absolute Neutrophils 3.50    Absolute Lymphocytes 2.99    Absolute Monocytes 0.55    Absolute Eosinophils 0.46 (H)    Absolute Basophils 0.08    Absolute Immature Granulocytes 0.03     CT Angio Chest (PE or trauma protocol)   Final Result      1. No acute findings in the chest. Negative for pulmonary embolism.   2. Subtle hypervascular pancreatic lesion suspicious for neuroendocrine   tumor.   3. Acute diverticulitis of the distal descending colon. No evidence of   abscess or perforation.   4. Additional chronic findings above.      Deward PARAS. Stacia, MD   01/25/2024 10:48 PM      CT Abdomen Pelvis W IV/ WO PO Cont   Final Result      1. No acute findings in the chest. Negative for pulmonary embolism.   2. Subtle hypervascular pancreatic lesion suspicious for neuroendocrine   tumor.   3. Acute diverticulitis of the distal descending colon. No evidence of   abscess or perforation.   4. Additional chronic findings above.      Deward PARAS. Stacia, MD   01/25/2024 10:48 PM              Attestations   Documentation Notes:  My documentation is often completed after the patient is no longer under my clinical care. In some cases, the Epic EMR may pull updated results into the above documentation which may not reflect all results or information that were available to me at the time of my medical decision making.   If there are questions or concerns about the content of this note or information contained within the body of this dictation they should be addressed directly with the author for clarification.    Neomi Solomons MD  I am the primary attending physician of record for this patient.              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   [8]   Past Surgical History:  Procedure Laterality Date    CYST REMOVAL  2015    abdomen    ESOPHAGOGASTRODUODENOSCOPY (EGD), ESOPHAGEAL ULTRASOUND (EUS) LIMITED TO THE ESOPHAGUS, STOMACH OR DUODENUM N/A 04/10/2023    Procedure: ESOPHAGOGASTRODUODENOSCOPY (EGD), ESOPHAGEAL ULTRASOUND (EUS) LIMITED TO THE ESOPHAGUS, STOMACH OR DUODENUM;  Surgeon: Abagail Carole HERO, MD;  Location: [facility];  Service: Gastroenterology;  Laterality: N/A;    ESOPHAGOGASTRODUODENOSCOPY (EGD), ESOPHAGEAL ULTRASOUND (EUS) WITH FINE NEEDLE ASPIRATION/BIOPSY OF ESOPHAGUS, STOMACH,  OR DUODENUM N/A 03/25/2023    Procedure: ESOPHAGOGASTRODUODENOSCOPY (EGD), ESOPHAGEAL ULTRASOUND (EUS) WITH FINE NEEDLE ASPIRATION/BIOPSY OF ESOPHAGUS, STOMACH, OR DUODENUM;  Surgeon: Abagail Carole HERO, MD;  Location: [facility];  Service: Gastroenterology;  Laterality: N/A;    ESOPHAGOGASTRODUODENOSCOPY (EGD), ESOPHAGEAL ULTRASOUND (EUS) WITH FINE NEEDLE ASPIRATION/BIOPSY OF ESOPHAGUS, STOMACH, OR DUODENUM N/A 04/03/2023    Procedure: ESOPHAGOGASTRODUODENOSCOPY (EGD), ESOPHAGEAL ULTRASOUND (EUS) WITH FINE NEEDLE ASPIRATION/BIOPSY OF ESOPHAGUS, STOMACH, OR DUODENUM;  Surgeon: Ulysess Foots, MD;  Location: [facility];  Service: Gastroenterology;  Laterality: N/A;    GALLBLADDER SURGERY  2009    HERNIA REPAIR  2014    HYSTERECTOMY  2020    NOSE SURGERY  2009    for fx    TUBAL LIGATION  1999   [9]   Social History  Tobacco Use    Smoking status: Never     Passive exposure: Current    Smokeless tobacco: Never   Vaping Use    Vaping status: Never Used   Substance Use Topics    Alcohol  use: Not Currently    Drug  use: Never   [10]   Family History  Problem Relation Name Age of Onset    Heart attack Mother      Diabetes Father      Stroke Son      Heart attack Paternal Grandfather      Heart attack Maternal Grandfather      Bone cancer Maternal Aunt      Heart attack Maternal Uncle      Stomach cancer Cousin     [11]   Allergies  Allergen Reactions    Benadryl  [Diphenhydramine ] Other (See Comments)     Pt unsure    Compazine [Prochlorperazine] Anxiety    Methocarbamol Itching     Pt denies        Mercedees Convery, Neomi HERO, MD  01/25/24 [phone]

## 2024-01-26 LAB — LAB USE ONLY - URINE GRAY CULTURE HOLD TUBE

## 2024-01-26 LAB — URINALYSIS WITH REFLEX TO MICROSCOPIC EXAM - REFLEX TO CULTURE
Urine Bilirubin: NEGATIVE
Urine Blood: NEGATIVE
Urine Glucose: NEGATIVE
Urine Ketones: NEGATIVE mg/dL
Urine Leukocyte Esterase: NEGATIVE
Urine Nitrite: NEGATIVE
Urine Protein: NEGATIVE
Urine Specific Gravity: >1.05 — ABNORMAL HIGH (ref 1.001–1.035)
Urine Urobilinogen: NORMAL mg/dL (ref 0.2–2.0)
Urine pH: 7.5 (ref 5.0–8.0)

## 2024-02-03 ENCOUNTER — Encounter: Admission: RE | Disposition: A | Discharge status: Home / Self Care | Payer: Self-pay | Source: Ambulatory Visit

## 2024-02-03 ENCOUNTER — Ambulatory Visit
Admission: RE | Admit: 2024-02-03 | Discharge: 2024-02-03 | Disposition: A | Discharge status: Home / Self Care | Source: Ambulatory Visit

## 2024-02-03 ENCOUNTER — Ambulatory Visit: Admitting: Anesthesiology

## 2024-02-03 DIAGNOSIS — K641 Second degree hemorrhoids: Secondary | ICD-10-CM | POA: Insufficient documentation

## 2024-02-03 DIAGNOSIS — K2970 Gastritis, unspecified, without bleeding: Secondary | ICD-10-CM

## 2024-02-03 DIAGNOSIS — K635 Polyp of colon: Secondary | ICD-10-CM | POA: Insufficient documentation

## 2024-02-03 DIAGNOSIS — K3189 Other diseases of stomach and duodenum: Secondary | ICD-10-CM

## 2024-02-03 DIAGNOSIS — R112 Nausea with vomiting, unspecified: Secondary | ICD-10-CM

## 2024-02-03 DIAGNOSIS — Z1211 Encounter for screening for malignant neoplasm of colon: Secondary | ICD-10-CM | POA: Insufficient documentation

## 2024-02-03 DIAGNOSIS — R6881 Early satiety: Secondary | ICD-10-CM

## 2024-02-03 DIAGNOSIS — G4733 Obstructive sleep apnea (adult) (pediatric): Secondary | ICD-10-CM | POA: Insufficient documentation

## 2024-02-03 DIAGNOSIS — K2950 Unspecified chronic gastritis without bleeding: Secondary | ICD-10-CM | POA: Insufficient documentation

## 2024-02-03 DIAGNOSIS — G43909 Migraine, unspecified, not intractable, without status migrainosus: Secondary | ICD-10-CM | POA: Insufficient documentation

## 2024-02-03 DIAGNOSIS — K219 Gastro-esophageal reflux disease without esophagitis: Secondary | ICD-10-CM

## 2024-02-03 DIAGNOSIS — K5909 Other constipation: Secondary | ICD-10-CM

## 2024-02-03 DIAGNOSIS — K5730 Diverticulosis of large intestine without perforation or abscess without bleeding: Secondary | ICD-10-CM | POA: Insufficient documentation

## 2024-02-03 DIAGNOSIS — R1013 Epigastric pain: Secondary | ICD-10-CM

## 2024-02-03 DIAGNOSIS — K2900 Acute gastritis without bleeding: Secondary | ICD-10-CM | POA: Insufficient documentation

## 2024-02-03 DIAGNOSIS — Z800 Family history of malignant neoplasm of digestive organs: Secondary | ICD-10-CM

## 2024-02-03 HISTORY — PX: COLONOSCOPY, SCREENING: SHX00150

## 2024-02-03 HISTORY — PX: ESOPHAGOGASTRODUODENOSCOPY (EGD), SCREENING: SHX00151

## 2024-02-03 SURGERY — COLONOSCOPY, SCREENING
Anesthesia: Anesthesia General | Site: Anus

## 2024-02-03 MED ORDER — PROPOFOL 10 MG/ML IV EMUL (WRAP)
INTRAVENOUS | Status: DC | PRN
  Administered 2024-02-03: 70 mg via INTRAVENOUS
  Administered 2024-02-03: 30 mg via INTRAVENOUS

## 2024-02-03 MED ORDER — LACTATED RINGERS IV SOLN: INTRAVENOUS | Status: DC | PRN

## 2024-02-03 MED ORDER — PROPOFOL INFUSION 10 MG/ML
INTRAVENOUS | Status: DC | PRN
  Administered 2024-02-03: 200 ug/kg/min via INTRAVENOUS

## 2024-02-03 MED ORDER — LIDOCAINE HCL 2 % IJ SOLN
INTRAMUSCULAR | Status: DC | PRN
  Administered 2024-02-03: 100 mg

## 2024-02-03 MED ORDER — LACTATED RINGERS IV SOLN
20.0000 mL/h | INTRAVENOUS | Status: DC
  Administered 2024-02-03: 20 mL/h via INTRAVENOUS
  Filled 2024-02-03: qty 1000

## 2024-02-03 SURGICAL SUPPLY — 29 items
BLOCK BITE OD60 FR BLOX (Other) ×2 IMPLANT
CATHETER OD10 FR ODSEC25 GA ID.24 MM L210 CM BIPOLAR ROUND DISTAL TIP (Catheter Miscellaneous) IMPLANT
CONNECTOR IRRIGATION AUXILIARY WATER JET 1 WAY VALVE HYDRA (Connector) ×2 IMPLANT
CONTAINER CYTOLOGY CELL FIXATION SELF CONTAINED MODIFIED SACCOMANNO (Lab Supplies) IMPLANT
CONTAINER HISTOLOGY 60 ML 30 ML GRADUATE LEAK RESISTANT O RING PREFILL (Procedure Accessories) IMPLANT
ELECTRODE ADULT PATIENT RETURN L9 FT REM POLYHESIVE ACRYLIC FOAM (Procedure Accessories) IMPLANT
FORCEPS BIOPSY 240 CM 3.2 MM JUMBO MICROMESH TEETH STREAMLINE CATHETER (Instrument) IMPLANT
FORCEPS BIOPSY L240 CM JUMBO NEEDLE RADIAL JAW (Procedure Accessories) IMPLANT
FORCEPS BIOPSY L240 CM STANDARD CAPACITY NEEDLE OD2.2 MM RADIAL JAW (Disposable Instruments) IMPLANT
GLOVE EXAM LARGE NITRILE POWDER FREE SENSE OATMEAL (Glove) ×2 IMPLANT
GOWN CHEMOTHERAPY L47 IN X W28 IN UNIVERSAL OPEN BACK OVERHEAD KNIT (Gown) ×2 IMPLANT
GOWN SRGCL LG LEVEL 4 BRTHBL STRL LF DSPSBL SMARTGOWN (Gown) ×2 IMPLANT
KIT ENDOSCOPIC COMPLIANCE ENDOKIT ORCAPOD 3 1.1 OZ (Kits) ×2 IMPLANT
KIT ENDOSCOPIC COMPLIANCE ENDOKIT ORCAPOD 4 1.1 OZ (Kits) ×2 IMPLANT
LIFTER SURGICAL SUBMUCOSAL LIFT AGENT EVERLIFT 5 ML (Endoscopic Supplies) IMPLANT
MANIFOLD SUCTION 2 STANDARD 4 PORT NEPTUNE 2 WASTE MANAGEMENT SYSTEM (Filter) ×2 IMPLANT
MARKER ENDOSCOPIC PERMANENT INDICATION DARK SYRINGE SPOT EX 5 ML (Syringes, Needles) IMPLANT
NEEDLE SCLEROTHERAPY CARR-LOCKE OD25 GA ODSEC2.5 MM L230 CM INJECTION (Needles) IMPLANT
SET CAP 24 HOUR ERBEFLOW TUBING ENDOSCOPY PUMP (Endoscopic Supplies) ×2 IMPLANT
SNARE L240 CM X W27 MM OD2.4 MM CAPTIVATOR POLYPECTOMY CRESCENT (Instrument) IMPLANT
SNARE OD10 MM CAPTIVATOR ENDOSCOPIC POLYPECTOMY (Procedure Accessories) IMPLANT
SNARE OD13 MM CAPTIFLEX ENDOSCOPIC POLYPECTOMY (Endoscopic Supplies) IMPLANT
SNARE SMALL HEXAGON CAPTIVATOR STIFF ENDOSCOPIC POLYPECTOMY (GE Lab Supplies) IMPLANT
SNARE SMALL OVAL L240 CM OD2.4 MM SENSATION LOOP SHORT THROW FLEXIBLE (Endoscopic Supplies) IMPLANT
SYRINGE 50 ML GRADUATE NONPYROGENIC DEHP FREE PVC FREE BD MEDICAL (Syringes, Needles) ×2 IMPLANT
TRAP 1 CHAMBER ENDO SUCTION QUICK CATCH WIDE-EYE SPECIMEN POLYP (Endoscopic Supplies) IMPLANT
TUBING SUCTION OD3/16 IN L12 FT FEMALE CONNECTOR RIBBED UNIVERSAL (Tubing) ×2 IMPLANT
WATER STERILE PLASTIC POUR BOTTLE 1000 ML (Irrigation Solutions) ×2 IMPLANT
WATER STERILE PVC FREE DEHP FREE 1000 ML (Solution) ×4 IMPLANT

## 2024-02-03 NOTE — Anesthesia Preprocedure Evaluation (Signed)
 Anesthesia Evaluation    AIRWAY    Mallampati: IV    TM distance: >3 FB  Neck ROM: full  Mouth Opening:full   CARDIOVASCULAR    regular and normal       DENTAL    no notable dental hx               PULMONARY    clear to auscultation     OTHER FINDINGS                                        Relevant Problems   ANESTHESIA   (+) OSA (obstructive sleep apnea)      PULMONARY   (+) OSA (obstructive sleep apnea)      NEURO/PSYCH   (+) Migraines      CARDIO   (+) Migraines      GI   (+) GERD (gastroesophageal reflux disease)       PSS Anesthesia Comments: States she does not need a nurse, learning disability, understands English  No problems with anesthesia   No family history of anesthesia problems  OSA no CPAP  GERD controlled        Anesthesia Plan    ASA 2     general                     intravenous induction   Detailed anesthesia plan: general IV        Post op pain management: per surgeon    informed consent obtained                     Signed by: Lamar Hedger, MD 02/03/2024 4:04 PM

## 2024-02-03 NOTE — H&P (Signed)
 GI PRE PROCEDURE NOTE    Proceduralist Comments:   Review of Systems and Past Medical / Surgical History performed: Yes     Indications: GERD, N/V, rectal bleeding, Colon cancer screening       Physical Exam / Laboratory Data (If applicable)   General: Alert and cooperative  Lungs: Lungs clear to auscultation  Cardiac: RRR, normal S1S2.    Abdomen: Soft, non tender. Normal active bowel sounds  Other:         Planned Sedation:   Deep sedation with anesthesia    Attestation:   Valerie May has been reassessed immediately prior to the procedure and is an appropriate candidate for the planned sedation and procedure. Risks, benefits and alternatives to the planned procedure and sedation have been explained to the patient or guardian:  yes      Signed by: Asberry Molt, MD

## 2024-02-03 NOTE — Discharge Instr - AVS First Page (Addendum)
Reason for your Hospital Admission:  COLONOSCOPY, SCREENING   ESOPHAGOGASTRODUODENOSCOPY (EGD), SCREENING      Instructions for after your discharge:  General Instructions:    Following sedation, your judgment, perception, and coordination are considered impaired. Even though you may feel awake and alert, you are considered legally intoxicated. Therefore, until the next morning:  Do not drive  Do not operate appliances or equipment that requires reaction time (e.g. stove, electrical tools, machinery)  Do not sign legal documents or be involved in important decisions  Do not smoke if alone  Do not drink alcoholic beverages    Go directly home and rest for several hours before resuming your routine activities  It is highly recommended to have a responsible adult stay with you for the next 24 hours    Tenderness, swelling, or pain may occur at the IV site where you received sedation. If you experience this, apply warm soaks to the area. Notify your physician if this persists.   If you have questions or problems contact your MD immediately. If you need immediate attention, call your MD, 911 and/or go to the nearest emergency room.       Report to the Physician any of the following:    For Upper Endoscopy, ERCP, EUS, or Dilations:  Pain in chest  Nausea/vomiting  Fever/chills within 24 hours after procedure. Temp > 101 degrees Farenheit  Severe and persistent abdominal pain or bloating  * Mild throat soreness may follow this procedure. Warm salt water gargling, lozenges, or cold drink and popscicles of your choice will most likely relieve your discomfort      For Colonoscopy, Sigmoidoscopy, or Proctoscopy:  Severe and persistent abdominal pain/bloating which does not subside within 2-3 hours  Large amount of rectal bleeding (some mucous blood streaking may occur, especially if biopsy or polypectomy was done or if hemorrhoids are present  Nausea/vomiting  Fever/chills within 24 hours after procedure. Temp > 101 degrees  Farenheit  * If polyp has been removed, DO NOT take aspirin or aspirin-containing products (e.g. Anacin, Alka Seltzer, Bufferin, etc) or non-steroidal anti-inflammatory drugs (e.g. Advil, Motrin, etc) for 2 days unless otherwise advised by doctor. Tylenol or Extra Strength Tylenol is permitted.   *Special instructions: Follow MD instructions on procedural report.       Upper GI        Lower GI

## 2024-02-05 NOTE — Anesthesia Postprocedure Evaluation (Signed)
 Anesthesia Post Evaluation    Patient: Valerie May    COLONOSCOPY, SCREENING (Anus)  ESOPHAGOGASTRODUODENOSCOPY (EGD), SCREENING (Abdomen)    Anesthesia type: general    Last Vitals:   Vitals Value Taken Time   BP  02/05/24 09:37   Temp  02/05/24 09:37   Pulse  02/05/24 09:37   Resp  02/05/24 09:37   SpO2  02/05/24 09:37                 Anesthesia Post Evaluation:                       Anesthetic complications: No                        Signed by: Lamar Hedger, MD, 02/05/2024 9:37 AM

## 2024-02-09 ENCOUNTER — Ambulatory Visit (INDEPENDENT_AMBULATORY_CARE_PROVIDER_SITE_OTHER): Payer: Self-pay

## 2024-02-09 LAB — SURGICAL PATHOLOGY

## 2024-02-10 ENCOUNTER — Ambulatory Visit (INDEPENDENT_AMBULATORY_CARE_PROVIDER_SITE_OTHER)

## 2024-02-16 ENCOUNTER — Telehealth: Payer: Self-pay

## 2024-02-16 NOTE — Telephone Encounter (Signed)
 Pt was called to go over results, pt verbalized understanding.         Spanish interpreter ID: [id number]

## 2024-02-16 NOTE — Telephone Encounter (Signed)
 Copied from CRM #[id number]. Topic: Clinical Support - Medical Question  >> Feb 16, 2024 10:45 AM Donneta D wrote:  [facility] EK D called about Clinical Support - Medical Question.  Additional details:    PT calling in to go over results from CLN / EGD from 02/03/24    Please advise pt   CB: [phone]

## 2024-02-17 ENCOUNTER — Other Ambulatory Visit (INDEPENDENT_AMBULATORY_CARE_PROVIDER_SITE_OTHER): Payer: Self-pay | Admitting: Physician Assistant

## 2024-02-17 DIAGNOSIS — K581 Irritable bowel syndrome with constipation: Secondary | ICD-10-CM

## 2024-02-17 DIAGNOSIS — K219 Gastro-esophageal reflux disease without esophagitis: Secondary | ICD-10-CM

## 2024-02-19 MED ORDER — LINACLOTIDE 145 MCG PO CAPS: 145.0000 ug | ORAL_CAPSULE | Freq: Every day | ORAL | 2 refills | Status: AC

## 2024-02-22 ENCOUNTER — Telehealth: Payer: Self-pay

## 2024-02-22 ENCOUNTER — Encounter (INDEPENDENT_AMBULATORY_CARE_PROVIDER_SITE_OTHER): Payer: Self-pay

## 2024-03-21 ENCOUNTER — Ambulatory Visit (INDEPENDENT_AMBULATORY_CARE_PROVIDER_SITE_OTHER): Admitting: Internal Medicine

## 2024-06-06 ENCOUNTER — Ambulatory Visit: Admitting: Hematology & Oncology
